# Patient Record
Sex: Male | Born: 1980 | Race: Black or African American | Hispanic: No | Marital: Single | Smoking: Current every day smoker
Health system: Southern US, Community
[De-identification: ages and names within clinical notes are randomized; demographics above are authoritative.]

## PROBLEM LIST (undated history)

## (undated) DIAGNOSIS — E785 Hyperlipidemia, unspecified: Secondary | ICD-10-CM

## (undated) DIAGNOSIS — F32A Depression, unspecified: Secondary | ICD-10-CM

## (undated) DIAGNOSIS — N529 Male erectile dysfunction, unspecified: Secondary | ICD-10-CM

## (undated) DIAGNOSIS — R011 Cardiac murmur, unspecified: Secondary | ICD-10-CM

## (undated) DIAGNOSIS — F1910 Other psychoactive substance abuse, uncomplicated: Secondary | ICD-10-CM

## (undated) DIAGNOSIS — F209 Schizophrenia, unspecified: Secondary | ICD-10-CM

## (undated) DIAGNOSIS — F1010 Alcohol abuse, uncomplicated: Secondary | ICD-10-CM

## (undated) DIAGNOSIS — Z720 Tobacco use: Secondary | ICD-10-CM

## (undated) HISTORY — PX: ORIF ORBITAL FRACTURE: SHX5312

---
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
## 2013-11-07 HISTORY — PX: WRIST SURGERY: SHX841

## 2015-02-14 LAB — CBC
HCT: 44.1 % (ref 40.0–52.0)
HGB: 14.1 g/dL (ref 13.0–18.0)
MCH: 26.6 pg (ref 26.0–34.0)
MCHC: 32 g/dL (ref 32.0–36.0)
MCV: 83 fL (ref 80–100)
PLATELETS: 278 10*3/uL (ref 150–440)
RBC: 5.32 10*6/uL (ref 4.40–5.90)
RDW: 14.5 % (ref 11.5–14.5)
WBC: 12.8 10*3/uL — ABNORMAL HIGH (ref 3.8–10.6)

## 2015-02-14 LAB — ETHANOL: Ethanol: 98 mg/dL

## 2015-02-14 LAB — DRUG SCREEN, URINE
AMPHETAMINES, UR SCREEN: NEGATIVE
Barbiturates, Ur Screen: NEGATIVE
Benzodiazepine, Ur Scrn: NEGATIVE
COCAINE METABOLITE, UR ~~LOC~~: NEGATIVE
Cannabinoid 50 Ng, Ur ~~LOC~~: NEGATIVE
MDMA (Ecstasy)Ur Screen: NEGATIVE
Methadone, Ur Screen: NEGATIVE
Opiate, Ur Screen: NEGATIVE
Phencyclidine (PCP) Ur S: NEGATIVE
Tricyclic, Ur Screen: NEGATIVE

## 2015-02-14 LAB — COMPREHENSIVE METABOLIC PANEL
ALBUMIN: 4.7 g/dL
ALT: 32 U/L
Alkaline Phosphatase: 62 U/L
Anion Gap: 9 (ref 7–16)
BUN: 12 mg/dL
Bilirubin,Total: 0.3 mg/dL
CALCIUM: 9.3 mg/dL
CHLORIDE: 107 mmol/L
CREATININE: 0.88 mg/dL
Co2: 21 mmol/L — ABNORMAL LOW
Glucose: 85 mg/dL
Potassium: 3.7 mmol/L
SGOT(AST): 25 U/L
SODIUM: 137 mmol/L
TOTAL PROTEIN: 8.3 g/dL — AB

## 2015-02-14 LAB — URINALYSIS, COMPLETE
BILIRUBIN, UR: NEGATIVE
Bacteria: NONE SEEN
Blood: NEGATIVE
Glucose,UR: NEGATIVE mg/dL (ref 0–75)
KETONE: NEGATIVE
LEUKOCYTE ESTERASE: NEGATIVE
Nitrite: NEGATIVE
Ph: 5 (ref 4.5–8.0)
Protein: NEGATIVE
RBC,UR: NONE SEEN /HPF (ref 0–5)
Specific Gravity: 1.004 (ref 1.003–1.030)
Squamous Epithelial: NONE SEEN

## 2015-02-14 LAB — ACETAMINOPHEN LEVEL: ACETAMINOPHEN: 35 ug/mL — AB

## 2015-02-14 LAB — SALICYLATE LEVEL: Salicylates, Serum: 5.8 mg/dL

## 2015-02-15 ENCOUNTER — Inpatient Hospital Stay
Admission: AD | Admit: 2015-02-15 | Discharge: 2015-02-20 | DRG: 885 | Disposition: A | Discharge status: Home / Self Care | Payer: Medicaid Other | Attending: Psychiatry | Admitting: Psychiatry

## 2015-02-15 DIAGNOSIS — F1020 Alcohol dependence, uncomplicated: Secondary | ICD-10-CM | POA: Diagnosis present

## 2015-02-15 DIAGNOSIS — F332 Major depressive disorder, recurrent severe without psychotic features: Secondary | ICD-10-CM | POA: Diagnosis present

## 2015-02-15 DIAGNOSIS — F1021 Alcohol dependence, in remission: Secondary | ICD-10-CM | POA: Diagnosis present

## 2015-02-15 DIAGNOSIS — F1590 Other stimulant use, unspecified, uncomplicated: Secondary | ICD-10-CM | POA: Diagnosis present

## 2015-02-15 DIAGNOSIS — F1010 Alcohol abuse, uncomplicated: Secondary | ICD-10-CM | POA: Diagnosis present

## 2015-02-15 DIAGNOSIS — F1421 Cocaine dependence, in remission: Secondary | ICD-10-CM | POA: Diagnosis present

## 2015-02-15 DIAGNOSIS — F17210 Nicotine dependence, cigarettes, uncomplicated: Secondary | ICD-10-CM | POA: Diagnosis present

## 2015-02-15 DIAGNOSIS — F602 Antisocial personality disorder: Secondary | ICD-10-CM | POA: Diagnosis present

## 2015-02-15 DIAGNOSIS — F17200 Nicotine dependence, unspecified, uncomplicated: Secondary | ICD-10-CM | POA: Diagnosis present

## 2015-02-15 DIAGNOSIS — Z9119 Patient's noncompliance with other medical treatment and regimen: Secondary | ICD-10-CM | POA: Diagnosis present

## 2015-02-16 LAB — TSH: Thyroid Stimulating Horm: 2.763 u[IU]/mL

## 2015-02-19 MED ORDER — MAGNESIUM HYDROXIDE 400 MG/5ML PO SUSP: 30.0000 mL | Freq: Every evening | ORAL | Status: DC | PRN

## 2015-02-19 MED ORDER — NICOTINE 10 MG IN INHA: 1.0000 | RESPIRATORY_TRACT | Status: DC | PRN

## 2015-02-19 MED ORDER — ACETAMINOPHEN 325 MG PO TABS: 650.0000 mg | ORAL_TABLET | ORAL | Status: DC | PRN

## 2015-02-19 MED ORDER — FLUOXETINE HCL 20 MG PO CAPS
20.0000 mg | ORAL_CAPSULE | Freq: Every day | ORAL | Status: DC
  Administered 2015-02-20: 20 mg via ORAL
  Filled 2015-02-19: qty 1

## 2015-02-19 MED ORDER — HYDROXYZINE HCL 50 MG PO TABS: 50.0000 mg | ORAL_TABLET | Freq: Three times a day (TID) | ORAL | Status: DC | PRN

## 2015-02-19 MED ORDER — TRAZODONE HCL 100 MG PO TABS: 100.0000 mg | ORAL_TABLET | Freq: Every day | ORAL | Status: DC

## 2015-02-19 MED ORDER — HALOPERIDOL 0.5 MG PO TABS
0.5000 mg | ORAL_TABLET | Freq: Three times a day (TID) | ORAL | Status: DC
  Administered 2015-02-20: 0.5 mg via ORAL
  Filled 2015-02-19: qty 1

## 2015-02-19 MED ORDER — BENZOCAINE-MENTHOL 20-0.5 % EX AERO
1.0000 "application " | INHALATION_SPRAY | CUTANEOUS | Status: DC | PRN
  Filled 2015-02-19: qty 56

## 2015-02-20 ENCOUNTER — Encounter: Payer: Self-pay | Admitting: Psychiatry

## 2015-02-20 DIAGNOSIS — F332 Major depressive disorder, recurrent severe without psychotic features: Secondary | ICD-10-CM | POA: Diagnosis present

## 2015-02-20 DIAGNOSIS — F1020 Alcohol dependence, uncomplicated: Secondary | ICD-10-CM | POA: Diagnosis present

## 2015-02-20 DIAGNOSIS — F1590 Other stimulant use, unspecified, uncomplicated: Secondary | ICD-10-CM | POA: Diagnosis present

## 2015-02-20 DIAGNOSIS — F17200 Nicotine dependence, unspecified, uncomplicated: Secondary | ICD-10-CM | POA: Diagnosis present

## 2015-02-20 DIAGNOSIS — F1021 Alcohol dependence, in remission: Secondary | ICD-10-CM | POA: Diagnosis present

## 2015-02-20 MED ORDER — FLUOXETINE HCL 20 MG PO CAPS: 20.0000 mg | ORAL_CAPSULE | Freq: Every day | ORAL | Status: DC

## 2015-02-20 MED ORDER — HALOPERIDOL 0.5 MG PO TABS: 0.5000 mg | ORAL_TABLET | Freq: Three times a day (TID) | ORAL | Status: DC | PRN

## 2015-02-20 MED ORDER — NICOTINE 10 MG IN INHA: 1.0000 | RESPIRATORY_TRACT | Status: DC | PRN

## 2015-02-20 MED ORDER — TRAZODONE HCL 100 MG PO TABS: 100.0000 mg | ORAL_TABLET | Freq: Every evening | ORAL | Status: DC | PRN

## 2015-02-20 NOTE — Progress Notes (Addendum)
Patient discharge instructions reviewed and AVS given. Patient appointment reminded for tomorrow. Patient belongings returned via nursing and checked with patient .
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
## 2015-02-20 NOTE — Discharge Summary (Signed)
Suicide Risk Assessment  Discharge Assessment     Current Mental Status by Physician: Patient denies suicidal or homicidal ideation, hallucinations, illusions, or delusions. Patient engages with good eye contact, is able to focus adequately in a one to one setting, and has clear goal directed thoughts. Patient speaks with a natural conversational volume, rate, and tone. Anxiety was reported at 1 on a scale of 1 the least and 10 the most. Depression was reported at 1 on the same scale. Patient is oriented times 4, recent and remote memory intact. Judgement: Good Insight: Good  Demographic factors:    Loss Factors:    Historical Factors:    Risk Reduction Factors:     Continued Clinical Symptoms:  Patient admitted for depression but currently denies depressed mood suicidality he reports much improvement since admission  Discharge Diagnoses: Major depressive disorder recurrent severe, alcohol use disorder severe,  stimulant use disorder severe (crack), tobacco use disorder, antisocial personality disorder  Cognitive Features That Contribute To Risk:  Patient is hopeful and future oriented  Suicide Risk:  Minimal: No identifiable suicidal ideation.  Patients presenting with no risk factors but with morbid ruminations; may be classified as minimal risk based on the severity of the depressive symptoms  Labs:  No results found for this or any previous visit (from the past 72 hour(s)). RISK REDUCTION FACTORS: What pt has learned from hospital stay is patient reports learning coping skills he feels more able to deal with agitation and anger. No agitation noted at discharge no irritability noted at discharge  Risk of self harm: Minimal  Risk of harm to others: Minimal    PLAN: Discharge home Continue   Medication List    TAKE these medications      Indication   FLUoxetine 20 MG capsule  Commonly known as:  PROZAC  Take 1 capsule (20 mg total) by mouth daily.   Indication:   Depression     haloperidol 0.5 MG tablet  Commonly known as:  HALDOL  Take 1 tablet (0.5 mg total) by mouth every 8 (eight) hours as needed for agitation.   Indication:  agitation     nicotine 10 MG inhaler  Commonly known as:  NICOTROL  Inhale 1 cartridge (1 continuous puffing total) into the lungs every 2 (two) hours as needed (nicotine craving).      traZODone 100 MG tablet  Commonly known as:  DESYREL  Take 1 tablet (100 mg total) by mouth at bedtime as needed for sleep.        Follow-up recommendations:  Activities: Resume typical activities Diet: Resume typical diet Tests: none Other: Follow up with outpatient provider and report any side effects to out patient prescriber.  Is patient on multiple antipsychotic therapies at discharge:  No  Has Patient had three or more failed trials of antipsychotic monotherapy by history: N/A Recommended Plan for Multiple Antipsychotic Therapies: N/A  Merlyn Albert 02/20/2015 12:44 PM
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
## 2016-04-05 ENCOUNTER — Encounter: Payer: Self-pay | Admitting: Emergency Medicine

## 2016-04-05 ENCOUNTER — Emergency Department
Admission: EM | Admit: 2016-04-05 | Discharge: 2016-04-06 | Disposition: A | Discharge status: Home / Self Care | Payer: Medicaid Other | Attending: Student | Admitting: Student

## 2016-04-05 DIAGNOSIS — Y999 Unspecified external cause status: Secondary | ICD-10-CM | POA: Insufficient documentation

## 2016-04-05 DIAGNOSIS — S0990XA Unspecified injury of head, initial encounter: Secondary | ICD-10-CM | POA: Diagnosis present

## 2016-04-05 DIAGNOSIS — Y929 Unspecified place or not applicable: Secondary | ICD-10-CM | POA: Diagnosis not present

## 2016-04-05 DIAGNOSIS — W0110XA Fall on same level from slipping, tripping and stumbling with subsequent striking against unspecified object, initial encounter: Secondary | ICD-10-CM | POA: Diagnosis not present

## 2016-04-05 DIAGNOSIS — F17210 Nicotine dependence, cigarettes, uncomplicated: Secondary | ICD-10-CM | POA: Diagnosis not present

## 2016-04-05 DIAGNOSIS — Y939 Activity, unspecified: Secondary | ICD-10-CM | POA: Insufficient documentation

## 2016-04-05 DIAGNOSIS — S51812A Laceration without foreign body of left forearm, initial encounter: Secondary | ICD-10-CM

## 2016-04-05 DIAGNOSIS — S0083XA Contusion of other part of head, initial encounter: Secondary | ICD-10-CM

## 2016-04-05 DIAGNOSIS — S01312A Laceration without foreign body of left ear, initial encounter: Secondary | ICD-10-CM | POA: Diagnosis not present

## 2016-04-05 DIAGNOSIS — F332 Major depressive disorder, recurrent severe without psychotic features: Secondary | ICD-10-CM | POA: Insufficient documentation

## 2016-04-05 DIAGNOSIS — Z79899 Other long term (current) drug therapy: Secondary | ICD-10-CM | POA: Diagnosis not present

## 2016-04-05 DIAGNOSIS — F1490 Cocaine use, unspecified, uncomplicated: Secondary | ICD-10-CM | POA: Diagnosis not present

## 2016-04-05 DIAGNOSIS — W19XXXA Unspecified fall, initial encounter: Secondary | ICD-10-CM

## 2016-04-05 LAB — RAPID HIV SCREEN (HIV 1/2 AB+AG)
HIV 1/2 ANTIBODIES: NONREACTIVE
HIV-1 P24 Antigen - HIV24: NONREACTIVE

## 2016-04-05 MED ORDER — TETANUS-DIPHTH-ACELL PERTUSSIS 5-2.5-18.5 LF-MCG/0.5 IM SUSP
0.5000 mL | Freq: Once | INTRAMUSCULAR | Status: AC
  Administered 2016-04-05: 0.5 mL via INTRAMUSCULAR
  Filled 2016-04-05: qty 0.5

## 2016-04-05 MED ORDER — LIDOCAINE-EPINEPHRINE (PF) 1 %-1:200000 IJ SOLN
10.0000 mL | Freq: Once | INTRAMUSCULAR | Status: AC
  Administered 2016-04-05: 10 mL
  Filled 2016-04-05: qty 30
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
## 2016-04-05 NOTE — Discharge Instructions (Signed)
Contusion A contusion is a deep bruise. Contusions are the result of a blunt injury to tissues and muscle fibers under the skin. The injury causes bleeding under the skin. The skin overlying the contusion may turn blue, purple, or yellow. Minor injuries will give you a painless contusion, but more severe contusions may stay painful and swollen for a few weeks.  CAUSES  This condition is usually caused by a blow, trauma, or direct force to an area of the body. SYMPTOMS  Symptoms of this condition include:  Swelling of the injured area.  Pain and tenderness in the injured area.  Discoloration. The area may have redness and then turn blue, purple, or yellow. DIAGNOSIS  This condition is diagnosed based on a physical exam and medical history. An X-ray, CT scan, or MRI may be needed to determine if there are any associated injuries, such as broken bones (fractures). TREATMENT  Specific treatment for this condition depends on what area of the body was injured. In general, the best treatment for a contusion is resting, icing, applying pressure to (compression), and elevating the injured area. This is often called the RICE strategy. Over-the-counter anti-inflammatory medicines may also be recommended for pain control.  HOME CARE INSTRUCTIONS   Rest the injured area.  If directed, apply ice to the injured area:  Put ice in a plastic bag.  Place a towel between your skin and the bag.  Leave the ice on for 20 minutes, 2-3 times per day.  If directed, apply light compression to the injured area using an elastic bandage. Make sure the bandage is not wrapped too tightly. Remove and reapply the bandage as directed by your health care provider.  If possible, raise (elevate) the injured area above the level of your heart while you are sitting or lying down.  Take over-the-counter and prescription medicines only as told by your health care provider. SEEK MEDICAL CARE IF:  Your symptoms do not  improve after several days of treatment.  Your symptoms get worse.  You have difficulty moving the injured area. SEEK IMMEDIATE MEDICAL CARE IF:   You have severe pain.  You have numbness in a hand or foot.  Your hand or foot turns pale or cold.   This information is not intended to replace advice given to you by your health care provider. Make sure you discuss any questions you have with your health care provider.   Document Released: 07/18/2005 Document Revised: 06/29/2015 Document Reviewed: 02/23/2015 Elsevier Interactive Patient Education 2016 [place].  Facial or Scalp Contusion A facial or scalp contusion is a deep bruise on the face or head. Injuries to the face and head generally cause a lot of swelling, especially around the eyes. Contusions are the result of an injury that caused bleeding under the skin. The contusion may turn blue, purple, or yellow. Minor injuries will give you a painless contusion, but more severe contusions may stay painful and swollen for a few weeks.  CAUSES  A facial or scalp contusion is caused by a blunt injury or trauma to the face or head area.  SIGNS AND SYMPTOMS   Swelling of the injured area.   Discoloration of the injured area.   Tenderness, soreness, or pain in the injured area.  DIAGNOSIS  The diagnosis can be made by taking a medical history and doing a physical exam. An X-ray exam, CT scan, or MRI may be needed to determine if there are any associated injuries, such as broken bones (fractures).  TREATMENT  Often, the best treatment for a facial or scalp contusion is applying cold compresses to the injured area. Over-the-counter medicines may also be recommended for pain control.  HOME CARE INSTRUCTIONS   Only take over-the-counter or prescription medicines as directed by your health care provider.   Apply ice to the injured area.   Put ice in a plastic bag.   Place a towel between your skin and the bag.   Leave the  ice on for 20 minutes, 2-3 times a day.  SEEK MEDICAL CARE IF:  You have bite problems.   You have pain with chewing.   You are concerned about facial defects. SEEK IMMEDIATE MEDICAL CARE IF:  You have severe pain or a headache that is not relieved by medicine.   You have unusual sleepiness, confusion, or personality changes.   You throw up (vomit).   You have a persistent nosebleed.   You have double vision or blurred vision.   You have fluid drainage from your nose or ear.   You have difficulty walking or using your arms or legs.  MAKE SURE YOU:   Understand these instructions.  Will watch your condition.  Will get help right away if you are not doing well or get worse.   This information is not intended to replace advice given to you by your health care provider. Make sure you discuss any questions you have with your health care provider.   Document Released: 11/15/2004 Document Revised: 10/29/2014 Document Reviewed: 05/21/2013 Elsevier Interactive Patient Education 2016 [place], Adult A laceration is a cut that goes through all of the layers of the skin and into the tissue that is right under the skin. Some lacerations heal on their own. Others need to be closed with stitches (sutures), staples, skin adhesive strips, or skin glue. Proper laceration care minimizes the risk of infection and helps the laceration to heal better. HOW TO CARE FOR YOUR LACERATION If sutures or staples were used:  Keep the wound clean and dry.  If you were given a bandage (dressing), you should change it at least one time per day or as told by your health care provider. You should also change it if it becomes wet or dirty.  Keep the wound completely dry for the first 24 hours or as told by your health care provider. After that time, you may shower or bathe. However, make sure that the wound is not soaked in water until after the sutures or staples have been  removed.  Clean the wound one time each day or as told by your health care provider:  Wash the wound with soap and water.  Rinse the wound with water to remove all soap.  Pat the wound dry with a clean towel. Do not rub the wound.  After cleaning the wound, apply a thin layer of antibiotic ointmentas told by your health care provider. This will help to prevent infection and keep the dressing from sticking to the wound.  Have the sutures or staples removed as told by your health care provider. If skin adhesive strips were used:  Keep the wound clean and dry.  If you were given a bandage (dressing), you should change it at least one time per day or as told by your health care provider. You should also change it if it becomes dirty or wet.  Do not get the skin adhesive strips wet. You may shower or bathe, but be careful to keep the  wound dry.  If the wound gets wet, pat it dry with a clean towel. Do not rub the wound.  Skin adhesive strips fall off on their own. You may trim the strips as the wound heals. Do not remove skin adhesive strips that are still stuck to the wound. They will fall off in time. If skin glue was used:  Try to keep the wound dry, but you may briefly wet it in the shower or bath. Do not soak the wound in water, such as by swimming.  After you have showered or bathed, gently pat the wound dry with a clean towel. Do not rub the wound.  Do not do any activities that will make you sweat heavily until the skin glue has fallen off on its own.  Do not apply liquid, cream, or ointment medicine to the wound while the skin glue is in place. Using those may loosen the film before the wound has healed.  If you were given a bandage (dressing), you should change it at least one time per day or as told by your health care provider. You should also change it if it becomes dirty or wet.  If a dressing is placed over the wound, be careful not to apply tape directly over the skin  glue. Doing that may cause the glue to be pulled off before the wound has healed.  Do not pick at the glue. The skin glue usually remains in place for 5-10 days, then it falls off of the skin. General Instructions  Take over-the-counter and prescription medicines only as told by your health care provider.  If you were prescribed an antibiotic medicine or ointment, take or apply it as told by your doctor. Do not stop using it even if your condition improves.  To help prevent scarring, make sure to cover your wound with sunscreen whenever you are outside after stitches are removed, after adhesive strips are removed, or when glue remains in place and the wound is healed. Make sure to wear a sunscreen of at least 30 SPF.  Do not scratch or pick at the wound.  Keep all follow-up visits as told by your health care provider. This is important.  Check your wound every day for signs of infection. Watch for:  Redness, swelling, or pain.  Fluid, blood, or pus.  Raise (elevate) the injured area above the level of your heart while you are sitting or lying down, if possible. SEEK MEDICAL CARE IF:  You received a tetanus shot and you have swelling, severe pain, redness, or bleeding at the injection site.  You have a fever.  A wound that was closed breaks open.  You notice a bad smell coming from your wound or your dressing.  You notice something coming out of the wound, such as wood or glass.  Your pain is not controlled with medicine.  You have increased redness, swelling, or pain at the site of your wound.  You have fluid, blood, or pus coming from your wound.  You notice a change in the color of your skin near your wound.  You need to change the dressing frequently due to fluid, blood, or pus draining from the wound.  You develop a new rash.  You develop numbness around the wound. SEEK IMMEDIATE MEDICAL CARE IF:  You develop severe swelling around the wound.  Your pain suddenly  increases and is severe.  You develop painful lumps near the wound or on skin that is anywhere on your  body.  You have a red streak going away from your wound.  The wound is on your hand or foot and you cannot properly move a finger or toe.  The wound is on your hand or foot and you notice that your fingers or toes look pale or bluish.   This information is not intended to replace advice given to you by your health care provider. Make sure you discuss any questions you have with your health care provider.   Document Released: 10/08/2005 Document Revised: 02/22/2015 Document Reviewed: 10/04/2014 Elsevier Interactive Patient Education [employer].
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
## 2016-04-06 NOTE — ED Notes (Signed)
Reviewed d/c instructions, follow-up care, laceration care with pt. Pt verbalized understanding.

## 2020-01-19 ENCOUNTER — Ambulatory Visit: Payer: Self-pay | Admitting: Pharmacy Technician

## 2020-01-19 ENCOUNTER — Other Ambulatory Visit: Payer: Self-pay

## 2020-01-19 DIAGNOSIS — Z79899 Other long term (current) drug therapy: Secondary | ICD-10-CM

## 2020-01-19 NOTE — Progress Notes (Signed)
Completed [facility] application and contract.  Patient agreed to all terms of the [facility] contract.    Patient approved to receive medication assistance at [facility] until time for re-certification in 2022, and as long as eligibility criteria continues to be met.    Provided patient with community resource material based on his particular needs.    Brian Rios Care Manager [facility]  

## 2020-06-10 ENCOUNTER — Ambulatory Visit: Payer: Self-pay

## 2020-07-17 ENCOUNTER — Emergency Department: Payer: Self-pay

## 2020-07-17 ENCOUNTER — Encounter: Payer: Self-pay | Admitting: Internal Medicine

## 2020-07-17 ENCOUNTER — Inpatient Hospital Stay
Admission: EM | Admit: 2020-07-17 | Discharge: 2020-07-19 | DRG: 065 | Disposition: A | Discharge status: Home / Self Care | Payer: Self-pay | Attending: Internal Medicine | Admitting: Internal Medicine

## 2020-07-17 ENCOUNTER — Other Ambulatory Visit: Payer: Self-pay

## 2020-07-17 ENCOUNTER — Observation Stay: Payer: Self-pay

## 2020-07-17 DIAGNOSIS — F17200 Nicotine dependence, unspecified, uncomplicated: Secondary | ICD-10-CM | POA: Diagnosis present

## 2020-07-17 DIAGNOSIS — Z79899 Other long term (current) drug therapy: Secondary | ICD-10-CM

## 2020-07-17 DIAGNOSIS — Z811 Family history of alcohol abuse and dependence: Secondary | ICD-10-CM

## 2020-07-17 DIAGNOSIS — Z8673 Personal history of transient ischemic attack (TIA), and cerebral infarction without residual deficits: Secondary | ICD-10-CM | POA: Diagnosis present

## 2020-07-17 DIAGNOSIS — F1510 Other stimulant abuse, uncomplicated: Secondary | ICD-10-CM | POA: Diagnosis present

## 2020-07-17 DIAGNOSIS — H547 Unspecified visual loss: Secondary | ICD-10-CM | POA: Diagnosis present

## 2020-07-17 DIAGNOSIS — F419 Anxiety disorder, unspecified: Secondary | ICD-10-CM | POA: Diagnosis present

## 2020-07-17 DIAGNOSIS — R4781 Slurred speech: Secondary | ICD-10-CM | POA: Diagnosis present

## 2020-07-17 DIAGNOSIS — F332 Major depressive disorder, recurrent severe without psychotic features: Secondary | ICD-10-CM | POA: Diagnosis present

## 2020-07-17 DIAGNOSIS — I639 Cerebral infarction, unspecified: Secondary | ICD-10-CM | POA: Diagnosis present

## 2020-07-17 DIAGNOSIS — R531 Weakness: Secondary | ICD-10-CM

## 2020-07-17 DIAGNOSIS — I63412 Cerebral infarction due to embolism of left middle cerebral artery: Secondary | ICD-10-CM | POA: Diagnosis present

## 2020-07-17 DIAGNOSIS — F17210 Nicotine dependence, cigarettes, uncomplicated: Secondary | ICD-10-CM | POA: Diagnosis present

## 2020-07-17 DIAGNOSIS — F1021 Alcohol dependence, in remission: Secondary | ICD-10-CM | POA: Diagnosis present

## 2020-07-17 DIAGNOSIS — Z20822 Contact with and (suspected) exposure to covid-19: Secondary | ICD-10-CM | POA: Diagnosis present

## 2020-07-17 DIAGNOSIS — I6340 Cerebral infarction due to embolism of unspecified cerebral artery: Principal | ICD-10-CM | POA: Diagnosis present

## 2020-07-17 DIAGNOSIS — F1590 Other stimulant use, unspecified, uncomplicated: Secondary | ICD-10-CM | POA: Diagnosis present

## 2020-07-17 DIAGNOSIS — F1910 Other psychoactive substance abuse, uncomplicated: Secondary | ICD-10-CM | POA: Diagnosis present

## 2020-07-17 DIAGNOSIS — F1020 Alcohol dependence, uncomplicated: Secondary | ICD-10-CM | POA: Diagnosis present

## 2020-07-17 HISTORY — DX: Depression, unspecified: F32.A

## 2020-07-17 HISTORY — DX: Alcohol abuse, uncomplicated: F10.10

## 2020-07-17 HISTORY — DX: Tobacco use: Z72.0

## 2020-07-17 HISTORY — DX: Cerebral infarction, unspecified: I63.9

## 2020-07-17 HISTORY — DX: Other psychoactive substance abuse, uncomplicated: F19.10

## 2020-07-17 LAB — RESPIRATORY PANEL BY RT PCR (FLU A&B, COVID)
Influenza A by PCR: NEGATIVE
Influenza B by PCR: NEGATIVE
SARS Coronavirus 2 by RT PCR: NEGATIVE

## 2020-07-17 LAB — VALPROIC ACID LEVEL: Valproic Acid Lvl: 25 ug/mL — ABNORMAL LOW (ref 50.0–100.0)

## 2020-07-17 LAB — URINALYSIS, COMPLETE (UACMP) WITH MICROSCOPIC
Bacteria, UA: NONE SEEN
Bilirubin Urine: NEGATIVE
Glucose, UA: NEGATIVE mg/dL
Hgb urine dipstick: NEGATIVE
Ketones, ur: NEGATIVE mg/dL
Leukocytes,Ua: NEGATIVE
Nitrite: NEGATIVE
Protein, ur: NEGATIVE mg/dL
Specific Gravity, Urine: 1.009 (ref 1.005–1.030)
Squamous Epithelial / LPF: NONE SEEN (ref 0–5)
pH: 6 (ref 5.0–8.0)

## 2020-07-17 LAB — URINE DRUG SCREEN, QUALITATIVE (ARMC ONLY)
Amphetamines, Ur Screen: NOT DETECTED
Barbiturates, Ur Screen: NOT DETECTED
Benzodiazepine, Ur Scrn: NOT DETECTED
Cannabinoid 50 Ng, Ur ~~LOC~~: NOT DETECTED
Cocaine Metabolite,Ur ~~LOC~~: NOT DETECTED
MDMA (Ecstasy)Ur Screen: NOT DETECTED
Methadone Scn, Ur: NOT DETECTED
Opiate, Ur Screen: NOT DETECTED
Phencyclidine (PCP) Ur S: NOT DETECTED
Tricyclic, Ur Screen: NOT DETECTED

## 2020-07-17 LAB — CBC
HCT: 42.5 % (ref 39.0–52.0)
Hemoglobin: 13.9 g/dL (ref 13.0–17.0)
MCH: 27.8 pg (ref 26.0–34.0)
MCHC: 32.7 g/dL (ref 30.0–36.0)
MCV: 85 fL (ref 80.0–100.0)
Platelets: 260 10*3/uL (ref 150–400)
RBC: 5 MIL/uL (ref 4.22–5.81)
RDW: 13.3 % (ref 11.5–15.5)
WBC: 9.3 10*3/uL (ref 4.0–10.5)
nRBC: 0 % (ref 0.0–0.2)

## 2020-07-17 LAB — BASIC METABOLIC PANEL
Anion gap: 10 (ref 5–15)
BUN: 13 mg/dL (ref 6–20)
CO2: 23 mmol/L (ref 22–32)
Calcium: 9.3 mg/dL (ref 8.9–10.3)
Chloride: 105 mmol/L (ref 98–111)
Creatinine, Ser: 0.92 mg/dL (ref 0.61–1.24)
GFR calc Af Amer: >60 mL/min (ref >60)
GFR calc non Af Amer: >60 mL/min (ref >60)
Glucose, Bld: 102 mg/dL — ABNORMAL HIGH (ref 70–99)
Potassium: 4.1 mmol/L (ref 3.5–5.1)
Sodium: 138 mmol/L (ref 135–145)

## 2020-07-17 LAB — APTT: aPTT: 39 seconds — ABNORMAL HIGH (ref 24–36)

## 2020-07-17 LAB — PROTIME-INR
INR: 1 (ref 0.8–1.2)
Prothrombin Time: 12.5 seconds (ref 11.4–15.2)

## 2020-07-17 LAB — TROPONIN I (HIGH SENSITIVITY): Troponin I (High Sensitivity): 3 ng/L (ref <18)

## 2020-07-17 MED ORDER — THIAMINE HCL 100 MG/ML IJ SOLN
100.0000 mg | Freq: Every day | INTRAMUSCULAR | Status: DC
  Filled 2020-07-17: qty 2

## 2020-07-17 MED ORDER — LORAZEPAM 2 MG/ML IJ SOLN: 0.0000 mg | Freq: Four times a day (QID) | INTRAMUSCULAR | Status: AC

## 2020-07-17 MED ORDER — ONDANSETRON HCL 4 MG/2ML IJ SOLN: 4.0000 mg | Freq: Three times a day (TID) | INTRAMUSCULAR | Status: DC | PRN

## 2020-07-17 MED ORDER — THIAMINE HCL 100 MG PO TABS
100.0000 mg | ORAL_TABLET | Freq: Every day | ORAL | Status: DC
  Administered 2020-07-17 – 2020-07-18 (×2): 100 mg via ORAL
  Filled 2020-07-17 (×3): qty 1

## 2020-07-17 MED ORDER — LORAZEPAM 2 MG/ML IJ SOLN: 1.0000 mg | INTRAMUSCULAR | Status: DC | PRN

## 2020-07-17 MED ORDER — SENNOSIDES-DOCUSATE SODIUM 8.6-50 MG PO TABS: 1.0000 | ORAL_TABLET | Freq: Every evening | ORAL | Status: DC | PRN

## 2020-07-17 MED ORDER — FOLIC ACID 1 MG PO TABS
1.0000 mg | ORAL_TABLET | Freq: Every day | ORAL | Status: DC
  Administered 2020-07-17 – 2020-07-18 (×2): 1 mg via ORAL
  Filled 2020-07-17 (×3): qty 1

## 2020-07-17 MED ORDER — LORAZEPAM 1 MG PO TABS: 1.0000 mg | ORAL_TABLET | ORAL | Status: DC | PRN

## 2020-07-17 MED ORDER — GADOBUTROL 1 MMOL/ML IV SOLN
10.0000 mL | Freq: Once | INTRAVENOUS | Status: AC | PRN
  Administered 2020-07-17: 10 mL via INTRAVENOUS

## 2020-07-17 MED ORDER — ADULT MULTIVITAMIN W/MINERALS CH
1.0000 | ORAL_TABLET | Freq: Every day | ORAL | Status: DC
  Administered 2020-07-17 – 2020-07-18 (×2): 1 via ORAL
  Filled 2020-07-17 (×2): qty 1

## 2020-07-17 MED ORDER — ASPIRIN 81 MG PO CHEW: 324.0000 mg | CHEWABLE_TABLET | Freq: Once | ORAL | Status: DC

## 2020-07-17 MED ORDER — STROKE: EARLY STAGES OF RECOVERY BOOK: Freq: Once | Status: AC

## 2020-07-17 MED ORDER — ACETAMINOPHEN 325 MG PO TABS
650.0000 mg | ORAL_TABLET | Freq: Four times a day (QID) | ORAL | Status: DC | PRN
  Filled 2020-07-17: qty 2

## 2020-07-17 MED ORDER — LORAZEPAM 2 MG/ML IJ SOLN: 0.0000 mg | Freq: Two times a day (BID) | INTRAMUSCULAR | Status: DC

## 2020-07-17 MED ORDER — NICOTINE 21 MG/24HR TD PT24
21.0000 mg | MEDICATED_PATCH | Freq: Every day | TRANSDERMAL | Status: DC
  Administered 2020-07-17 – 2020-07-19 (×3): 21 mg via TRANSDERMAL
  Filled 2020-07-17 (×3): qty 1

## 2020-07-17 MED ORDER — ATORVASTATIN CALCIUM 20 MG PO TABS
40.0000 mg | ORAL_TABLET | Freq: Every day | ORAL | Status: DC
  Administered 2020-07-17 – 2020-07-19 (×3): 40 mg via ORAL
  Filled 2020-07-17 (×3): qty 2

## 2020-07-17 MED ORDER — ASPIRIN EC 325 MG PO TBEC
325.0000 mg | DELAYED_RELEASE_TABLET | Freq: Every day | ORAL | Status: DC
  Administered 2020-07-17 – 2020-07-19 (×3): 325 mg via ORAL
  Filled 2020-07-17 (×3): qty 1

## 2020-07-17 MED ORDER — ENOXAPARIN SODIUM 40 MG/0.4ML ~~LOC~~ SOLN
40.0000 mg | SUBCUTANEOUS | Status: DC
  Administered 2020-07-17 – 2020-07-18 (×2): 40 mg via SUBCUTANEOUS
  Filled 2020-07-17 (×2): qty 0.4

## 2020-07-17 MED ORDER — ASPIRIN 300 MG RE SUPP
300.0000 mg | Freq: Every day | RECTAL | Status: DC
  Filled 2020-07-17: qty 1

## 2020-07-17 NOTE — ED Notes (Signed)
ED provider at bedside, stroke assessment being completed. Pt states symptoms of R sided weakness and numbness may have started up to 3d ago and have been intermittent. Mother now at bedside.

## 2020-07-17 NOTE — H&P (Addendum)
History and Physical    Weber Monnier OMV:[id number] DOB: 10-20-1981 DOA: 07/17/2020  Referring MD/NP/PA:   PCP: Luna Fuse, MD   Patient coming from:  The patient is coming from home.  At baseline, pt is independent for most of ADL.        Chief Complaint: Right-sided weakness and slurred speech  HPI: Brian Rios is a 39 y.o. male with medical history significant of polysubstance abuse, alcohol abuse, tobacco abuse, depression, who presents with right-sided weakness and slurred speech.  Patient states that he has been having right-sided weakness and numbness in the past 3 days, which is intermittent, involving both right arm and right leg.  He also has intermittent slurred speech.  No hearing loss.  Patient states that he has chronic poor vision, which has not changed.  Patient does not have chest pain, cough, shortness breath.  No fever or chills.  Denies nausea, vomiting, diarrhea, abdominal pain, symptoms of UTI.  Currently patient does not have weakness or numbness in extremities.  ED Course: pt was found to have WBC 9.3, pending COVID-19 PCR, pending BMP, temperature normal, blood pressure 124/80, heart rate 85, RR 21, oxygen saturation 96% on room air.  Patient is placed on MedSurg bed for observation.  Dr. Irish Elders of neurology is consulted.  CT head showed: Abnormal hypoattenuation and enlargement of the left caudate and surrounding basal ganglia. Additional ill-defined hypoattenuation within the overlying left corona radiata. Findings are concerning for acute/early subacute infarct. Infiltrating tumor and encephalitis are additional differential considerations. Recommend MRI with and without contrast to further evaluate   Review of Systems:   General: no fevers, chills, no body weight gain, has fatigue HEENT: has poor vision, no hearing changes or sore throat Respiratory: no dyspnea, coughing, wheezing CV: no chest pain, no palpitations GI: no  nausea, vomiting, abdominal pain, diarrhea, constipation GU: no dysuria, burning on urination, increased urinary frequency, hematuria  Ext: no leg edema Neuro: Has intermittent right-sided weakness and numbness, has slurred speech Skin: no rash, no skin tear. MSK: No muscle spasm, no deformity, no limitation of range of movement in spin Heme: No easy bruising.  Travel history: No recent long distant travel.  Allergy: No Known Allergies  Past Medical History:  Diagnosis Date  . Alcohol abuse   . Depression   . Drug abuse ([place])   . Tobacco abuse     Past Surgical History:  Procedure Laterality Date  . ORIF ORBITAL FRACTURE      Social History:  reports that he has been smoking cigarettes. He has been smoking about 1.00 pack per day. He does not have any smokeless tobacco history on file. He reports current alcohol use. He reports current drug use. Drug: "Crack" cocaine.  Family History:  Family History  Problem Relation Age of Onset  . Alcoholism Mother   . Alcoholism Father      Prior to Admission medications   Medication Sig Start Date End Date Taking? Authorizing Provider  FLUoxetine (PROZAC) 20 MG capsule Take 1 capsule (20 mg total) by mouth daily. 02/20/15   Hildred Priest, MD  haloperidol (HALDOL) 0.5 MG tablet Take 1 tablet (0.5 mg total) by mouth every 8 (eight) hours as needed for agitation. 02/20/15   Hildred Priest, MD  nicotine (NICOTROL) 10 MG inhaler Inhale 1 cartridge (1 continuous puffing total) into the lungs every 2 (two) hours as needed (nicotine craving). 02/20/15   Hildred Priest, MD  traZODone (DESYREL) 100 MG tablet Take 1 tablet (100 mg  total) by mouth at bedtime as needed for sleep. 02/20/15   Hildred Priest, MD    Physical Exam: Vitals:   07/17/20 1215 07/17/20 1217  BP: 124/80   Pulse: 85   Resp: (!) 21   Temp: 98.4 F (36.9 C)   TempSrc: Oral   SpO2: 96%   Weight:  97.1 kg  Height:  5\' 9"  (1.753 m)    General: Not in acute distress HEENT:       Eyes: PERRL, EOMI, no scleral icterus.       ENT: No discharge from the ears and nose, no pharynx injection, no tonsillar enlargement.        Neck: No JVD, no bruit, no mass felt. Heme: No neck lymph node enlargement. Cardiac: S1/S2, RRR, No murmurs, No gallops or rubs. Respiratory: No rales, wheezing, rhonchi or rubs. GI: Soft, nondistended, nontender, no rebound pain, no organomegaly, BS present. GU: No hematuria Ext: No pitting leg edema bilaterally. 2+DP/PT pulse bilaterally. Musculoskeletal: No joint deformities, No joint redness or warmth, no limitation of ROM in spin. Skin: No rashes.  Neuro: Alert, oriented X3, cranial nerves II-XII grossly intact, moves all extremities normally. Muscle strength 5/5 in all extremities, sensation to light touch intact. Brachial reflex 2+ bilaterally.  Psych: Patient is not psychotic, no suicidal or hemocidal ideation.  Labs on Admission: I have personally reviewed following labs and imaging studies  CBC: Recent Labs  Lab 07/17/20 1221  WBC 9.3  HGB 13.9  HCT 42.5  MCV 85.0  PLT 500   Basic Metabolic Panel: Recent Labs  Lab 07/17/20 1338  NA 138  K 4.1  CL 105  CO2 23  GLUCOSE 102*  BUN 13  CREATININE 0.92  CALCIUM 9.3   GFR: Estimated Creatinine Clearance: 122.7 mL/min (by C-G formula based on SCr of 0.92 mg/dL). Liver Function Tests: No results for input(s): AST, ALT, ALKPHOS, BILITOT, PROT, ALBUMIN in the last 168 hours. No results for input(s): LIPASE, AMYLASE in the last 168 hours. No results for input(s): AMMONIA in the last 168 hours. Coagulation Profile: No results for input(s): INR, PROTIME in the last 168 hours. Cardiac Enzymes: No results for input(s): CKTOTAL, CKMB, CKMBINDEX, TROPONINI in the last 168 hours. BNP (last 3 results) No results for input(s): PROBNP in the last 8760 hours. HbA1C: No results for input(s): HGBA1C in the last 72 hours. CBG: No results  for input(s): GLUCAP in the last 168 hours. Lipid Profile: No results for input(s): CHOL, HDL, LDLCALC, TRIG, CHOLHDL, LDLDIRECT in the last 72 hours. Thyroid Function Tests: No results for input(s): TSH, T4TOTAL, FREET4, T3FREE, THYROIDAB in the last 72 hours. Anemia Panel: No results for input(s): VITAMINB12, FOLATE, FERRITIN, TIBC, IRON, RETICCTPCT in the last 72 hours. Urine analysis:    Component Value Date/Time   COLORURINE Colorless 02/14/2015 1915   APPEARANCEUR Clear 02/14/2015 1915   LABSPEC 1.004 02/14/2015 1915   PHURINE 5.0 02/14/2015 1915   GLUCOSEU Negative 02/14/2015 1915   HGBUR Negative 02/14/2015 1915   BILIRUBINUR Negative 02/14/2015 1915   KETONESUR Negative 02/14/2015 1915   PROTEINUR Negative 02/14/2015 1915   NITRITE Negative 02/14/2015 1915   LEUKOCYTESUR Negative 02/14/2015 1915   Sepsis Labs: @LABRCNTIP (procalcitonin:4,lacticidven:4) )No results found for this or any previous visit (from the past 240 hour(s)).   Radiological Exams on Admission: CT HEAD WO CONTRAST  Result Date: 07/17/2020 CLINICAL DATA:  Mental status change EXAM: CT HEAD WITHOUT CONTRAST TECHNIQUE: Contiguous axial images were obtained from the base of the skull through  the vertex without intravenous contrast. COMPARISON:  None. FINDINGS: Brain: Abnormal hypoattenuation and enlargement of the left caudate and swelling basal ganglia. Additional ill-defined hypoattenuation within the overlying left corona radiata. No acute hemorrhage. Mild effacement of the frontal horn of the left lateral ventricle. Otherwise, no mass effect. No hydrocephalus. Vascular: No hyperdense vessel identified. Skull: Normal. Negative for fracture or focal lesion. Sinuses/Orbits: No acute finding. Other: No mastoid effusion. IMPRESSION: Abnormal hypoattenuation and enlargement of the left caudate and surrounding basal ganglia. Additional ill-defined hypoattenuation within the overlying left corona radiata. Findings are  concerning for acute/early subacute infarct. Infiltrating tumor and encephalitis are additional differential considerations. Recommend MRI with and without contrast to further evaluate. These findings and recommendations were results were called by telephone at the time of interpretation on 07/17/2020 at 1:07 pm to provider Dr. Cherylann Banas, Who verbally acknowledged these results. Electronically Signed   By: Margaretha Sheffield MD   On: 07/17/2020 13:13     EKG: Independently reviewed.  Sinus rhythm, QTC 4 8, J-point elevation in V2-V3, T wave inversion only in lead III, early R wave progression   Assessment/Plan Principal Problem:   Stroke [facility]) Active Problems:   Major depressive disorder, recurrent episode, severe ([facility])   Alcohol use disorder, severe, dependence ([place])   Stimulant use disorder   Tobacco use disorder   Drug abuse ([place])   Stroke [facility]): CT-head showed possible stroke left caudate head and corona radiata. Infiltrating tumor and encephalitis are additional differential differential diagnoses.  Dr. Irish Elders for neurology is consulted, who recommended to get a MRI of brain with contrast and MRA of head and neck.  - Placed on MedSurg bed for observation - Highly appreciated neurologist's consultation - Obtain MRI of brain with contrast and MRA of head and neck - start ASA and lipitor - fasting lipid panel and HbA1c  - 2D transthoracic echocardiography  - swallowing screen. If fails, will get SLP - Check UDS  - PT/OT consult   Anxiety and major depressive disorder, recurrent episode, severe ([place]): no SI or HI. -Continue home medications  Polysubstance abuse including alcohol, tobacco, stimulant use and drug abuse: -Did counseling about importance of quitting substance -Nicotine patch -CIWA protocol -f/u UDS   DVT ppx: SQ Lovenox Code Status: Full code Family Communication:  Yes, patient's mother at bed side Disposition Plan:  Anticipate discharge back to previous  environment Consults called:  Dr. Irish Elders of neurology Admission status: Med-surg bed for obs   Status is: Observation  The patient remains OBS appropriate and will d/c before 2 midnights.  Dispo: The patient is from: Home              Anticipated d/c is to: Home              Anticipated d/c date is: 1 day              Patient currently is not medically stable to d/c.          Date of Service 07/17/2020    Ivor Costa [facility]   If 7PM-7AM, please contact night-coverage [email or web address] 07/17/2020, 2:17 PM

## 2020-07-17 NOTE — ED Triage Notes (Signed)
Pt brought by [facility], intermittent R-sided weakness and numbness since 9/24 (2d). Hx drug abuse and mental illness per ems. Speech slightly slurred. Pt checked in, triage order set placed.

## 2020-07-17 NOTE — ED Notes (Signed)
Pt provided urinal. Using both arms to hold urinal at this time. When pt arrived to ER was unable to move R arm. Had to use L arm to move R arm. Able to lift both arms at this time.

## 2020-07-17 NOTE — Consult Note (Signed)
Reason for Consult: R sided weakness  Requesting Physician: Dr. Blaine Hamper  CC: R sided weakness   HPI: Brian Rios is an 39 y.o. male medical history significant of polysubstance abuse, alcohol abuse, tobacco abuse, depression, who presents with right-sided weakness and slurred speech for about 3 days, which is intermittent, involving both right arm and right leg.  He also has intermittent slurred speech.  Pt states he has been falling to the R side.    Past Medical History:  Diagnosis Date  . Alcohol abuse   . Depression   . Drug abuse ([place])   . Tobacco abuse     Past Surgical History:  Procedure Laterality Date  . ORIF ORBITAL FRACTURE      Family History  Problem Relation Age of Onset  . Alcoholism Mother   . Alcoholism Father     Social History:  reports that he has been smoking cigarettes. He has been smoking about 1.00 pack per day. He does not have any smokeless tobacco history on file. He reports current alcohol use. He reports current drug use. Drug: "Crack" cocaine.  No Known Allergies  Medications: I have reviewed the patient's current medications.  ROS: As above  Physical Examination: Blood pressure 123/85, pulse 83, temperature 98.4 F (36.9 C), temperature source Oral, resp. rate 18, height 5\' 9"  (1.753 m), weight 97.1 kg, SpO2 96 %.   Neurological Examination   Mental Status: Alert, oriented, thought content appropriate.  Mild dysarthria  Cranial Nerves: II: Discs flat bilaterally; Visual fields grossly normal, pupils equal, round, reactive to light and accommodation III,IV, VI: ptosis not present, extra-ocular motions intact bilaterally V,VII: R facial droop  VIII: hearing normal bilaterally IX,X: gag reflex present XI: bilateral shoulder shrug XII: midline tongue extension Motor: Right : Upper extremity   /5    Left:     Upper extremity   5/5  Lower extremity  4/5     Lower extremity   5/5 Tone and bulk:normal tone throughout; no atrophy  noted Sensory: Pinprick and light touch intact throughout, bilaterally Deep Tendon Reflexes: 2+ and symmetric throughout Plantars: Right: downgoing   Left: downgoing Cerebellar: normal finger-to-nose    Laboratory Studies:   Basic Metabolic Panel: Recent Labs  Lab 07/17/20 1338  NA 138  K 4.1  CL 105  CO2 23  GLUCOSE 102*  BUN 13  CREATININE 0.92  CALCIUM 9.3    Liver Function Tests: No results for input(s): AST, ALT, ALKPHOS, BILITOT, PROT, ALBUMIN in the last 168 hours. No results for input(s): LIPASE, AMYLASE in the last 168 hours. No results for input(s): AMMONIA in the last 168 hours.  CBC: Recent Labs  Lab 07/17/20 1221  WBC 9.3  HGB 13.9  HCT 42.5  MCV 85.0  PLT 260    Cardiac Enzymes: No results for input(s): CKTOTAL, CKMB, CKMBINDEX, TROPONINI in the last 168 hours.  BNP: Invalid input(s): POCBNP  CBG: No results for input(s): GLUCAP in the last 168 hours.  Microbiology: No results found for this or any previous visit.  Coagulation Studies: No results for input(s): LABPROT, INR in the last 72 hours.  Urinalysis:  Recent Labs  Lab 07/17/20 1415  COLORURINE YELLOW*  LABSPEC 1.009  PHURINE 6.0  GLUCOSEU NEGATIVE  HGBUR NEGATIVE  BILIRUBINUR NEGATIVE  KETONESUR NEGATIVE  PROTEINUR NEGATIVE  NITRITE NEGATIVE  LEUKOCYTESUR NEGATIVE    Lipid Panel:  No results found for: CHOL, TRIG, HDL, CHOLHDL, VLDL, LDLCALC  HgbA1C: No results found for: HGBA1C  Urine Drug  Screen:      Component Value Date/Time   LABOPIA NONE DETECTED 07/17/2020 1415   COCAINSCRNUR NONE DETECTED 07/17/2020 1415   LABBENZ NONE DETECTED 07/17/2020 1415   AMPHETMU NONE DETECTED 07/17/2020 1415   THCU NONE DETECTED 07/17/2020 1415   LABBARB NONE DETECTED 07/17/2020 1415    Alcohol Level: No results for input(s): ETH in the last 168 hours.  Other results: EKG: normal EKG, normal sinus rhythm, unchanged from previous tracings.  Imaging: CT HEAD WO  CONTRAST  Result Date: 07/17/2020 CLINICAL DATA:  Mental status change EXAM: CT HEAD WITHOUT CONTRAST TECHNIQUE: Contiguous axial images were obtained from the base of the skull through the vertex without intravenous contrast. COMPARISON:  None. FINDINGS: Brain: Abnormal hypoattenuation and enlargement of the left caudate and swelling basal ganglia. Additional ill-defined hypoattenuation within the overlying left corona radiata. No acute hemorrhage. Mild effacement of the frontal horn of the left lateral ventricle. Otherwise, no mass effect. No hydrocephalus. Vascular: No hyperdense vessel identified. Skull: Normal. Negative for fracture or focal lesion. Sinuses/Orbits: No acute finding. Other: No mastoid effusion. IMPRESSION: Abnormal hypoattenuation and enlargement of the left caudate and surrounding basal ganglia. Additional ill-defined hypoattenuation within the overlying left corona radiata. Findings are concerning for acute/early subacute infarct. Infiltrating tumor and encephalitis are additional differential considerations. Recommend MRI with and without contrast to further evaluate. These findings and recommendations were results were called by telephone at the time of interpretation on 07/17/2020 at 1:07 pm to provider Dr. Cherylann Banas, Who verbally acknowledged these results. Electronically Signed   By: Margaretha Sheffield MD   On: 07/17/2020 13:13     Assessment/Plan:  39 y.o. male medical history significant of polysubstance abuse, alcohol abuse, tobacco abuse, depression, who presents with right-sided weakness and slurred speech for about 3 days, which is intermittent, involving both right arm and right leg.  He also has intermittent slurred speech.  Pt states he has been falling to the R side.    - CTH with L BG stroke - MRI and MRA h/n pending - ASA/stat therapy -2decho - pt/ot - speech  - pt is being admitted - d/w mother and pt at bedside.    Leotis Pain  07/17/2020, 3:02 PM

## 2020-07-17 NOTE — ED Notes (Signed)
Pt given water, cola, crackers.

## 2020-07-17 NOTE — ED Notes (Signed)
Pt to MRI

## 2020-07-17 NOTE — ED Provider Notes (Signed)
[facility] Emergency Department Provider Note   ____________________________________________   First MD Initiated Contact with Patient 07/17/20 1332     (approximate)  I have reviewed the triage vital signs and the nursing notes.   HISTORY  Chief Complaint Weakness    HPI Brian Rios is a 39 y.o. male history of alcohol abuse depression drug abuse  Patient presents today he has been having 2 to 3 days of weakness and numbness of his right side, somewhat intermittent in nature.  Has been present though for at least 2 days.  Symptoms are somewhat intermittent at times  No difficulty speaking.  Some numbness over the right side.  Does not take any blood thinners.  History of stroke in older family member grandparent  He does smoke  History of schizophrenia.  Past Medical History:  Diagnosis Date  . Alcohol abuse   . Depression   . Drug abuse ([place])   . Tobacco abuse     Patient Active Problem List   Diagnosis Date Noted  . Stroke ([place]) 07/17/2020  . Drug abuse ([place]) 07/17/2020  . Major depressive disorder, recurrent episode, severe ([place]) 02/20/2015  . Alcohol use disorder, severe, dependence ([place]) 02/20/2015  . Stimulant use disorder 02/20/2015  . Tobacco use disorder 02/20/2015    Past Surgical History:  Procedure Laterality Date  . ORIF ORBITAL FRACTURE      Prior to Admission medications   Medication Sig Start Date End Date Taking? Authorizing Provider  FLUoxetine (PROZAC) 20 MG capsule Take 1 capsule (20 mg total) by mouth daily. 02/20/15   Hildred Priest, MD  haloperidol (HALDOL) 0.5 MG tablet Take 1 tablet (0.5 mg total) by mouth every 8 (eight) hours as needed for agitation. 02/20/15   Hildred Priest, MD  nicotine (NICOTROL) 10 MG inhaler Inhale 1 cartridge (1 continuous puffing total) into the lungs every 2 (two) hours as needed (nicotine craving). 02/20/15   Hildred Priest, MD  traZODone  (DESYREL) 100 MG tablet Take 1 tablet (100 mg total) by mouth at bedtime as needed for sleep. 02/20/15   Hildred Priest, MD    Allergies Patient has no known allergies.  Family History  Problem Relation Age of Onset  . Alcoholism Mother   . Alcoholism Father     Social History Social History   Tobacco Use  . Smoking status: Current Every Day Smoker    Packs/day: 1.00    Types: Cigarettes  Substance Use Topics  . Alcohol use: Yes  . Drug use: Yes    Types: "Crack" cocaine    Review of Systems Constitutional: No fever/chills Eyes: No visual changes. ENT: No sore throat. Cardiovascular: Denies chest pain. Respiratory: Denies shortness of breath. Gastrointestinal: No abdominal pain.   Genitourinary: Negative for dysuria. Musculoskeletal: Negative for back pain. Skin: Negative for rash. Neurological: Negative for headaches. Denies Covid exposure    ____________________________________________   PHYSICAL EXAM:  VITAL SIGNS: ED Triage Vitals  Enc Vitals Group     BP 07/17/20 1215 124/80     Pulse Rate 07/17/20 1215 85     Resp 07/17/20 1215 (!) 21     Temp 07/17/20 1215 98.4 F (36.9 C)     Temp Source 07/17/20 1215 Oral     SpO2 07/17/20 1215 96 %     Weight 07/17/20 1217 214 lb (97.1 kg)     Height 07/17/20 1217 5\' 9"  (1.753 m)     Head Circumference --      Peak Flow --  Pain Score 07/17/20 1216 0     Pain Loc --      Pain Edu? --      Excl. in [place]? --     Constitutional: Alert and oriented. Well appearing and in no acute distress. Eyes: Conjunctivae are normal. Head: Atraumatic. Nose: No congestion/rhinnorhea. Mouth/Throat: Mucous membranes are moist. Neck: No stridor.  Cardiovascular: Normal rate, regular rhythm. Grossly normal heart sounds.  Good peripheral circulation. Respiratory: Normal respiratory effort.  No retractions. Lungs CTAB. Gastrointestinal: Soft and nontender. No distention. Musculoskeletal: No lower extremity  tenderness nor edema. Neurologic:  Normal speech and language. No gross focal neurologic deficits are appreciated.  He does have moderate weakness in his right arm and right leg about 2-3+ strength in each.  Pronator drift present on the right.  Decreased sensation right arm and right leg.  Somewhat ataxic.  Skin:  Skin is warm, dry and intact. No rash noted. Psychiatric: Mood and affect are normal. Speech and behavior are normal.  ____________________________________________   LABS (all labs ordered are listed, but only abnormal results are displayed)  Labs Reviewed  BASIC METABOLIC PANEL - Abnormal; Notable for the following components:      Result Value   Glucose, Bld 102 (*)    All other components within normal limits  RESPIRATORY PANEL BY RT PCR (FLU A&B, COVID)  CBC  URINALYSIS, COMPLETE (UACMP) WITH MICROSCOPIC  VALPROIC ACID LEVEL  URINE DRUG SCREEN, QUALITATIVE ([facility] ONLY)  CBG MONITORING, ED  TROPONIN I (HIGH SENSITIVITY)   ____________________________________________  EKG  Reviewed interpreted at 1215 Heart rate 85 QRs 100 QTc 410 Normal sinus rhythm, ST abnormality including T wave inversions lead III, J-point elevation in V2 and slight in V3 felt likely due to repolarization abnormality.  No STEMI.  Of note the patient denies any chest pain ____________________________________________  RADIOLOGY  CT HEAD WO CONTRAST  Result Date: 07/17/2020 CLINICAL DATA:  Mental status change EXAM: CT HEAD WITHOUT CONTRAST TECHNIQUE: Contiguous axial images were obtained from the base of the skull through the vertex without intravenous contrast. COMPARISON:  None. FINDINGS: Brain: Abnormal hypoattenuation and enlargement of the left caudate and swelling basal ganglia. Additional ill-defined hypoattenuation within the overlying left corona radiata. No acute hemorrhage. Mild effacement of the frontal horn of the left lateral ventricle. Otherwise, no mass effect. No hydrocephalus.  Vascular: No hyperdense vessel identified. Skull: Normal. Negative for fracture or focal lesion. Sinuses/Orbits: No acute finding. Other: No mastoid effusion. IMPRESSION: Abnormal hypoattenuation and enlargement of the left caudate and surrounding basal ganglia. Additional ill-defined hypoattenuation within the overlying left corona radiata. Findings are concerning for acute/early subacute infarct. Infiltrating tumor and encephalitis are additional differential considerations. Recommend MRI with and without contrast to further evaluate. These findings and recommendations were results were called by telephone at the time of interpretation on 07/17/2020 at 1:07 pm to provider Dr. Cherylann Banas, Who verbally acknowledged these results. Electronically Signed   By: Margaretha Sheffield MD   On: 07/17/2020 13:13    CT of the head reviewed and discussed with Dr. Irish Elders.  Concern for abnormality in the left caudate region.  Acute or early subacute infarct versus other cause such as tumor or encephalitis.  Discussed with Dr. Irish Elders, he advises the patient's clinical history symptoms and imaging appear consistent with caudate stroke ____________________________________________   PROCEDURES  Procedure(s) performed: None  Procedures  Critical Care performed: No  ____________________________________________   INITIAL IMPRESSION / ASSESSMENT AND PLAN / ED COURSE  Pertinent labs & imaging results  that were available during my care of the patient were reviewed by me and considered in my medical decision making (see chart for details).   Patient presents for right-sided weakness and numbness.  Somewhat intermittent but is apparently had also deficit including facial droop for a few days, up to 2 days time.  He is outside of any acute TPA or acute neuro interventional window for stroke such as for treatment of LVO. Negative VAN stroke scale for LVO.    Clinical Course as of Jul 17 1426  Sun Jul 17, 2020  1232  Dr. Irish Elders discussed case with me, he will be coming to see patient for stat consult.    [MQ]    Clinical Course User Index [MQ] Delman Kitten, MD   No associated chest pain.  Lab work reassuring including normal CBC and chemistry.  Discussed with Dr. Irish Elders is seen evaluate the patient, recommends treatment with aspirin and admission for further work-up including MRI.  Admission discussed with Dr. Verita Schneiders was evaluated in Emergency Department on 07/17/2020 for the symptoms described in the history of present illness. He was evaluated in the context of the global COVID-19 pandemic, which necessitated consideration that the patient might be at risk for infection with the SARS-CoV-2 virus that causes COVID-19. Institutional protocols and algorithms that pertain to the evaluation of patients at risk for COVID-19 are in a state of rapid change based on information released by regulatory bodies including the CDC and federal and state organizations. These policies and algorithms were followed during the patient's care in the ED.   ____________________________________________   FINAL CLINICAL IMPRESSION(S) / ED DIAGNOSES  Final diagnoses:  Cerebrovascular accident (CVA), unspecified mechanism ([place])  Acute right-sided weakness        Note:  This document was prepared using Dragon voice recognition software and may include unintentional dictation errors       Delman Kitten, MD 07/17/20 1427

## 2020-07-17 NOTE — ED Notes (Signed)
Pt back from MRI 

## 2020-07-17 NOTE — ED Notes (Signed)
Mother left room to update family. Correct number added to pt chart.

## 2020-07-18 ENCOUNTER — Encounter: Payer: Self-pay | Admitting: Internal Medicine

## 2020-07-18 ENCOUNTER — Observation Stay
Admit: 2020-07-18 | Discharge: 2020-07-18 | Disposition: A | Discharge status: Home / Self Care | Payer: Self-pay | Attending: Internal Medicine | Admitting: Internal Medicine

## 2020-07-18 DIAGNOSIS — I639 Cerebral infarction, unspecified: Secondary | ICD-10-CM | POA: Diagnosis present

## 2020-07-18 DIAGNOSIS — Z8673 Personal history of transient ischemic attack (TIA), and cerebral infarction without residual deficits: Secondary | ICD-10-CM | POA: Diagnosis present

## 2020-07-18 LAB — ECHOCARDIOGRAM COMPLETE
AR max vel: 2.13 cm2
AV Area VTI: 2.24 cm2
AV Area mean vel: 2.19 cm2
AV Mean grad: 3.5 mmHg
AV Peak grad: 6.2 mmHg
Ao pk vel: 1.24 m/s
Area-P 1/2: 4.04 cm2
Height: 69 in
S' Lateral: 2.9 cm
Weight: 3424 oz

## 2020-07-18 LAB — HEMOGLOBIN A1C
Hgb A1c MFr Bld: 6 % — ABNORMAL HIGH (ref 4.8–5.6)
Mean Plasma Glucose: 125.5 mg/dL

## 2020-07-18 LAB — LIPID PANEL
Cholesterol: 278 mg/dL — ABNORMAL HIGH (ref 0–200)
HDL: 51 mg/dL (ref >40)
LDL Cholesterol: 200 mg/dL — ABNORMAL HIGH (ref 0–99)
Total CHOL/HDL Ratio: 5.5 RATIO
Triglycerides: 133 mg/dL (ref <150)
VLDL: 27 mg/dL (ref 0–40)

## 2020-07-18 LAB — HIV ANTIBODY (ROUTINE TESTING W REFLEX): HIV Screen 4th Generation wRfx: NONREACTIVE

## 2020-07-18 MED ORDER — DIVALPROEX SODIUM ER 500 MG PO TB24
750.0000 mg | ORAL_TABLET | Freq: Every day | ORAL | Status: DC
  Administered 2020-07-18 (×2): 750 mg via ORAL
  Filled 2020-07-18: qty 3
  Filled 2020-07-18: qty 1
  Filled 2020-07-18: qty 3
  Filled 2020-07-18: qty 1

## 2020-07-18 MED ORDER — ARIPIPRAZOLE 15 MG PO TABS
30.0000 mg | ORAL_TABLET | Freq: Every day | ORAL | Status: DC
  Administered 2020-07-18: 30 mg via ORAL
  Filled 2020-07-18 (×3): qty 2

## 2020-07-18 MED ORDER — CLOPIDOGREL BISULFATE 75 MG PO TABS
75.0000 mg | ORAL_TABLET | Freq: Every day | ORAL | Status: DC
  Administered 2020-07-19: 75 mg via ORAL
  Filled 2020-07-18: qty 1

## 2020-07-18 MED ORDER — CLOPIDOGREL BISULFATE 75 MG PO TABS
300.0000 mg | ORAL_TABLET | Freq: Once | ORAL | Status: AC
  Administered 2020-07-18: 300 mg via ORAL
  Filled 2020-07-18: qty 4

## 2020-07-18 MED ORDER — VALBENAZINE TOSYLATE 80 MG PO CAPS: 1.0000 | ORAL_CAPSULE | Freq: Every day | ORAL | Status: DC

## 2020-07-18 MED ORDER — BENZTROPINE MESYLATE 1 MG PO TABS
1.0000 mg | ORAL_TABLET | Freq: Every day | ORAL | Status: DC
  Administered 2020-07-18 (×2): 1 mg via ORAL
  Filled 2020-07-18 (×4): qty 1

## 2020-07-18 MED ORDER — MIRTAZAPINE 15 MG PO TABS
45.0000 mg | ORAL_TABLET | Freq: Every day | ORAL | Status: DC
  Administered 2020-07-18: 45 mg via ORAL
  Filled 2020-07-18: qty 3

## 2020-07-18 NOTE — Progress Notes (Signed)
*  PRELIMINARY RESULTS* Echocardiogram 2D Echocardiogram has been performed.  Brian Rios 07/18/2020, 7:50 AM

## 2020-07-18 NOTE — ED Notes (Signed)
Pt denies any numbness/tingling or weakness at this time. Denies pain

## 2020-07-18 NOTE — Plan of Care (Signed)
  Problem: Education: Goal: Knowledge of secondary prevention will improve 07/18/2020 1509 by Ferrel Logan, RN Outcome: Progressing 07/18/2020 1508 by Ferrel Logan, RN Outcome: Progressing

## 2020-07-18 NOTE — Progress Notes (Signed)
OT Screen Note  Patient Details Name: Brian Rios MRN: [id number] DOB: [birth year]   Cancelled Treatment:    Reason Eval/Treat Not Completed: OT screened, no needs identified, will sign off.  Pt currently functioning at independent level for all aspects of self care and functional mobility without use of AD. Pt report's symptoms have resolved and he feels he is at his baseline. No further skilled need. Thank you for this referral. OT will SIGN OFF.  Darleen Crocker, [place], OTR/L , CBIS ascom [phone]  07/18/20, 2:34 PM   07/18/2020, 2:32 PM

## 2020-07-18 NOTE — Progress Notes (Signed)
SLP Cancellation Note  Patient Details Name: Brian Rios MRN: [id number] DOB: December 26, 1980   Cancelled treatment:       Reason Eval/Treat Not Completed: SLP screened, no needs identified, will sign off (chart reviewed; consulted NSG then met w/ pt). Pt denied any difficulty swallowing and is currently on a regular diet; tolerates swallowing pills w/ water per NSG. Pt conversed at conversational level w/out deficits noted; pt and NSG denied any speech-language deficits. Pt stated he was "straight now".   No further skilled ST services indicated as pt appears at his baseline. Pt agreed. NSG to reconsult if any change in status.     Orinda Kenner, MS, CCC-SLP Speech Language Pathologist [facility] [phone] [facility] 07/18/2020, 3:04 PM

## 2020-07-18 NOTE — Progress Notes (Signed)
PT Cancellation Note  Patient Details Name: Brian Rios MRN: [id number] DOB: 27-Dec-1980   Cancelled Treatment:    Reason Eval/Treat Not Completed: Other (comment) Pt notes being at his baseline in regards to strength, coordination, and ambulation. He notes his strength is back on his R with no lingering deficits. He has full ROM with bilateral UE and LE movements. In regards to coordination, pt showed no deficits between L and R during finger to nose test. During ambulation, pt walked 150 feet at his normal pace without any AD. He was completely steady and able to maintain good balance. He does not need any PT services at this time. Please re-order if circumstances change.   Noemi Chapel, SPT  Bernita Raisin 07/18/2020, 11:54 AM

## 2020-07-18 NOTE — Progress Notes (Signed)
Subjective: He feels significantly improved  Exam: Vitals:   07/18/20 0400 07/18/20 0500  BP: 138/71 122/90  Pulse: 84 88  Resp:  14  Temp:    SpO2:  96%   Gen: In bed, NAD Resp: non-labored breathing, no acute distress Abd: soft, nt  Neuro: MS: Awake, alert, interactive and appropriate CN: Pupils are reactive bilaterally, he has a left exotropia which is baseline for him, mild right facial weakness Motor: He does not have drift, or weakness to confrontation, but does have mildly impaired fine motor movements on the right Sensory: He endorses symmetric sensation to both light touch and temperature.  Pertinent Labs: LDL 200(no statin prior to arrival) A1c - pending UDS - negative  MRI/MRA reviewed - large subcortical infarct involving head of the caudate and   Initial NIHSS: 4  Impression: 39 yo M with multifocal infarcts on the left. Certainly with his LDL and smoking history, he is at risk of small vessel disease and the subcortical infarct could be associated with this, but the cortical infarcts appear more likley embolic. He will need optimization of his risk factors including a high intensity statin, as well as screening for embolic sources including echo and if his TTE is negative, consideration of TEE.   Recommendations: 1) Recommend high intensity statin such as atorvastatin 80mg  qhs 2) Dual antiplatelet therapy with aSA and plavix for three weeks followed by monotherapy.  3) If A1C is elevated, would target A1C < 7 4) Continue psychiatric medications at home doses.  5) Echocardiogram, consider TEE if no embolic source found 6) cardiac monitoring  7) I have counseled smoking cessation to the patient 8) neurology will continue to follow.   Roland Rack, MD [facility] [phone]  If 7pm- 7am, please page neurology on call as listed in [place].

## 2020-07-18 NOTE — Progress Notes (Signed)
PROGRESS NOTE    Brian Rios  XNT:[id number] DOB: [birth year] DOA: 07/17/2020 PCP: Luna Fuse, MD   Chief complaint: stroke.  Brief Narrative:  Brian Rios is a 39 y.o. male with medical history significant of polysubstance abuse, alcohol abuse, tobacco abuse, depression, who presents with right-sided weakness and slurred speech.  MRI showed multifocal acute infarcts involving the left cerebrum and left basal ganglia, likely embolic.  9/27.  Patient symptom improved.  Pending echocardiogram, TEE if transthoracic echocardiogram does not see any blood clots.   Assessment & Plan:   Principal Problem:   Stroke [facility]) Active Problems:   Major depressive disorder, recurrent episode, severe (HCC)   Alcohol use disorder, severe, dependence ([place])   Stimulant use disorder   Tobacco use disorder   Drug abuse ([place])  #1.  Left brain embolic stroke.   Discussed with neurology, may need TEE if her echocardiogram is normal. Continue aspirin, Plavix, Lipitor.  #2.  Alcohol abuse. No evidence of withdrawal. Continue thiamine and folic acid.  Monitor for withdrawal symptoms.  CIWA protocol.  3.  Polydrug abuse including cocaine. Patient does not use IV drugs.     DVT prophylaxis: Lovenox Code Status: Full Family Communication: None  .   Status is: Observation  The patient will require care spanning > 2 midnights and should be moved to inpatient because: Additional work-up is needed for stroke.  Dispo: The patient is from: Home              Anticipated d/c is to: Home              Anticipated d/c date is: 1 day              Patient currently is not medically stable to d/c.        I/O last 3 completed shifts: In: -  Out: 1100 [Urine:1100] No intake/output data recorded.     Consultants:   Neurology  Procedures: None  Antimicrobials:None  Subjective: Patient feels better.  Speech has recovered.  Right-sided weakness has improved. Does  not have any dysphagia. No nausea vomiting abdominal pain peer No breath or cough. No fever chills.  Objective: Vitals:   07/18/20 0045 07/18/20 0400 07/18/20 0500 07/18/20 0930  BP:  138/71 122/90 129/68  Pulse: 99 84 88 85  Resp:   14 16  Temp:      TempSrc:      SpO2: 98%  96% 97%  Weight:      Height:        Intake/Output Summary (Last 24 hours) at 07/18/2020 0953 Last data filed at 07/17/2020 2230 Gross per 24 hour  Intake --  Output 1100 ml  Net -1100 ml   Filed Weights   07/17/20 1217  Weight: 97.1 kg    Examination:  General exam: Appears calm and comfortable  Respiratory system: Clear to auscultation. Respiratory effort normal. Cardiovascular system: S1 & S2 heard, RRR. No JVD, murmurs, rubs, gallops or clicks. No pedal edema. Gastrointestinal system: Abdomen is nondistended, soft and nontender. No organomegaly or masses felt. Normal bowel sounds heard. Central nervous system: Alert and oriented. No focal neurological deficits. Extremities: Symmetric  Skin: No rashes, lesions or ulcers Psychiatry:  Mood & affect appropriate.     Data Reviewed: I have personally reviewed following labs and imaging studies  CBC: Recent Labs  Lab 07/17/20 1221  WBC 9.3  HGB 13.9  HCT 42.5  MCV 85.0  PLT 967   Basic Metabolic Panel: Recent Labs  Lab 07/17/20 1338  NA 138  K 4.1  CL 105  CO2 23  GLUCOSE 102*  BUN 13  CREATININE 0.92  CALCIUM 9.3   GFR: Estimated Creatinine Clearance: 122.7 mL/min (by C-G formula based on SCr of 0.92 mg/dL). Liver Function Tests: No results for input(s): AST, ALT, ALKPHOS, BILITOT, PROT, ALBUMIN in the last 168 hours. No results for input(s): LIPASE, AMYLASE in the last 168 hours. No results for input(s): AMMONIA in the last 168 hours. Coagulation Profile: Recent Labs  Lab 07/17/20 1641  INR 1.0   Cardiac Enzymes: No results for input(s): CKTOTAL, CKMB, CKMBINDEX, TROPONINI in the last 168 hours. BNP (last 3  results) No results for input(s): PROBNP in the last 8760 hours. HbA1C: No results for input(s): HGBA1C in the last 72 hours. CBG: No results for input(s): GLUCAP in the last 168 hours. Lipid Profile: Recent Labs    07/18/20 0502  CHOL 278*  HDL 51  LDLCALC 200*  TRIG 133  CHOLHDL 5.5   Thyroid Function Tests: No results for input(s): TSH, T4TOTAL, FREET4, T3FREE, THYROIDAB in the last 72 hours. Anemia Panel: No results for input(s): VITAMINB12, FOLATE, FERRITIN, TIBC, IRON, RETICCTPCT in the last 72 hours. Sepsis Labs: No results for input(s): PROCALCITON, LATICACIDVEN in the last 168 hours.  Recent Results (from the past 240 hour(s))  Respiratory Panel by RT PCR (Flu A&B, Covid) - Nasopharyngeal Swab     Status: None   Collection Time: 07/17/20  2:33 PM   Specimen: Nasopharyngeal Swab  Result Value Ref Range Status   SARS Coronavirus 2 by RT PCR NEGATIVE NEGATIVE Final    Comment: (NOTE) SARS-CoV-2 target nucleic acids are NOT DETECTED.  The SARS-CoV-2 RNA is generally detectable in upper respiratoy specimens during the acute phase of infection. The lowest concentration of SARS-CoV-2 viral copies this assay can detect is 131 copies/mL. A negative result does not preclude SARS-Cov-2 infection and should not be used as the sole basis for treatment or other patient management decisions. A negative result may occur with  improper specimen collection/handling, submission of specimen other than nasopharyngeal swab, presence of viral mutation(s) within the areas targeted by this assay, and inadequate number of viral copies (<131 copies/mL). A negative result must be combined with clinical observations, patient history, and epidemiological information. The expected result is Negative.  Fact Sheet for Patients:  PinkCheek.be  Fact Sheet for Healthcare Providers:  GravelBags.it  This test is no t yet approved or  cleared by the [place] FDA and  has been authorized for detection and/or diagnosis of SARS-CoV-2 by FDA under an Emergency Use Authorization (EUA). This EUA will remain  in effect (meaning this test can be used) for the duration of the COVID-19 declaration under Section 564(b)(1) of the Act, 21 U.S.C. section 360bbb-3(b)(1), unless the authorization is terminated or revoked sooner.     Influenza A by PCR NEGATIVE NEGATIVE Final   Influenza B by PCR NEGATIVE NEGATIVE Final    Comment: (NOTE) The Xpert Xpress SARS-CoV-2/FLU/RSV assay is intended as an aid in  the diagnosis of influenza from Nasopharyngeal swab specimens and  should not be used as a sole basis for treatment. Nasal washings and  aspirates are unacceptable for Xpert Xpress SARS-CoV-2/FLU/RSV  testing.  Fact Sheet for Patients: PinkCheek.be  Fact Sheet for Healthcare Providers: GravelBags.it  This test is not yet approved or cleared by the [place] FDA and  has been authorized for detection and/or diagnosis of SARS-CoV-2 by  FDA under an  Emergency Use Authorization (EUA). This EUA will remain  in effect (meaning this test can be used) for the duration of the  Covid-19 declaration under Section 564(b)(1) of the Act, 21  U.S.C. section 360bbb-3(b)(1), unless the authorization is  terminated or revoked. Performed at [facility], [address] [place], [place] [address]          Radiology Studies: CT HEAD WO CONTRAST  Result Date: 07/17/2020 CLINICAL DATA:  Mental status change EXAM: CT HEAD WITHOUT CONTRAST TECHNIQUE: Contiguous axial images were obtained from the base of the skull through the vertex without intravenous contrast. COMPARISON:  None. FINDINGS: Brain: Abnormal hypoattenuation and enlargement of the left caudate and swelling basal ganglia. Additional ill-defined hypoattenuation within the overlying left corona radiata. No  acute hemorrhage. Mild effacement of the frontal horn of the left lateral ventricle. Otherwise, no mass effect. No hydrocephalus. Vascular: No hyperdense vessel identified. Skull: Normal. Negative for fracture or focal lesion. Sinuses/Orbits: No acute finding. Other: No mastoid effusion. IMPRESSION: Abnormal hypoattenuation and enlargement of the left caudate and surrounding basal ganglia. Additional ill-defined hypoattenuation within the overlying left corona radiata. Findings are concerning for acute/early subacute infarct. Infiltrating tumor and encephalitis are additional differential considerations. Recommend MRI with and without contrast to further evaluate. These findings and recommendations were results were called by telephone at the time of interpretation on 07/17/2020 at 1:07 pm to provider Dr. Cherylann Banas, Who verbally acknowledged these results. Electronically Signed   By: Margaretha Sheffield MD   On: 07/17/2020 13:13   MR ANGIO HEAD WO CONTRAST  Addendum Date: 07/17/2020   ADDENDUM REPORT: 07/17/2020 16:38 ADDENDUM: These results were called by telephone at the time of interpretation on 07/17/2020 at 4:38 pm to provider Ivor Costa , who verbally acknowledged these results. Electronically Signed   By: Primitivo Gauze M.D.   On: 07/17/2020 16:38   Result Date: 07/17/2020 CLINICAL DATA:  Stroke, follow-up EXAM: MR HEAD WITHOUT CONTRAST AND WITH MR CIRCLE OF WILLIS WITHOUT CONTRAST MRA OF THE NECK WITHOUT AND WITH CONTRAST TECHNIQUE: Multiplanar, multiecho pulse sequences of the brain, circle of willis and surrounding structures were obtained without intravenous contrast. Postcontrast imaging of the brain was obtained. Angiographic images of the neck were obtained using MRA technique without and with intravenous contrast. CONTRAST:  12mL GADAVIST GADOBUTROL 1 MMOL/ML IV SOLN COMPARISON:  07/17/2020 head CT. FINDINGS: MR HEAD FINDINGS Brain: Multifocal acute infarcts involving the left frontoparietal and  occipitotemporal regions. Larger infarcts are seen within the left caudate and putamen. Minimal petechial hemorrhage along the left caudate. No midline shift, ventriculomegaly or extra-axial fluid collection. No mass lesion. No abnormal enhancement. Vascular: Please see MRA head. Skull and upper cervical spine: Normal marrow signal. Sinuses/Orbits: Normal orbits. Ethmoid and right maxillary sinus mucosal thickening. No mastoid effusion. Other: None. MR CIRCLE OF WILLIS FINDINGS Anterior circulation: Left A1 segment aplasia. Patent, normal caliber anterior cerebral arteries. Patent normal caliber appearance of the internal carotid and middle cerebral arteries. No significant stenosis, proximal occlusion, aneurysm, or vascular malformation. Posterior circulation: Right dominant vertebral artery. Patent normal caliber V4 segments, PICA, basilar, superior cerebellar and posterior cerebral arteries. No significant stenosis, proximal occlusion, aneurysm, or vascular malformation. Venous sinuses: Patent. Anatomic variants: Bilateral posterior communicating arteries are either absent or hypoplastic. MRA NECK FINDINGS Motion artifact limits evaluation. There is no high-grade narrowing or focal aneurysm involving the bilateral carotid arteries. No evidence of dissection. The bilateral vertebral arteries are patent and demonstrate antegrade flow. Dominant right vertebral artery. Mild left  V1 segment narrowing. No evidence of high-grade narrowing or focal aneurysm. Three vessel aortic arch. 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 No high-grade narrowing, large vessel occlusion, dissection or aneurysm within the head or neck. Electronically Signed: By: Primitivo Gauze M.D. On: 07/17/2020 16:23   MR ANGIO NECK W WO CONTRAST  Addendum Date: 07/17/2020   ADDENDUM REPORT: 07/17/2020 16:38 ADDENDUM: These results were called by telephone at the time of  interpretation on 07/17/2020 at 4:38 pm to provider Ivor Costa , who verbally acknowledged these results. Electronically Signed   By: Primitivo Gauze M.D.   On: 07/17/2020 16:38   Result Date: 07/17/2020 CLINICAL DATA:  Stroke, follow-up EXAM: MR HEAD WITHOUT CONTRAST AND WITH MR CIRCLE OF WILLIS WITHOUT CONTRAST MRA OF THE NECK WITHOUT AND WITH CONTRAST TECHNIQUE: Multiplanar, multiecho pulse sequences of the brain, circle of willis and surrounding structures were obtained without intravenous contrast. Postcontrast imaging of the brain was obtained. Angiographic images of the neck were obtained using MRA technique without and with intravenous contrast. CONTRAST:  64mL GADAVIST GADOBUTROL 1 MMOL/ML IV SOLN COMPARISON:  07/17/2020 head CT. FINDINGS: MR HEAD FINDINGS Brain: Multifocal acute infarcts involving the left frontoparietal and occipitotemporal regions. Larger infarcts are seen within the left caudate and putamen. Minimal petechial hemorrhage along the left caudate. No midline shift, ventriculomegaly or extra-axial fluid collection. No mass lesion. No abnormal enhancement. Vascular: Please see MRA head. Skull and upper cervical spine: Normal marrow signal. Sinuses/Orbits: Normal orbits. Ethmoid and right maxillary sinus mucosal thickening. No mastoid effusion. Other: None. MR CIRCLE OF WILLIS FINDINGS Anterior circulation: Left A1 segment aplasia. Patent, normal caliber anterior cerebral arteries. Patent normal caliber appearance of the internal carotid and middle cerebral arteries. No significant stenosis, proximal occlusion, aneurysm, or vascular malformation. Posterior circulation: Right dominant vertebral artery. Patent normal caliber V4 segments, PICA, basilar, superior cerebellar and posterior cerebral arteries. No significant stenosis, proximal occlusion, aneurysm, or vascular malformation. Venous sinuses: Patent. Anatomic variants: Bilateral posterior communicating arteries are either absent or  hypoplastic. MRA NECK FINDINGS Motion artifact limits evaluation. There is no high-grade narrowing or focal aneurysm involving the bilateral carotid arteries. No evidence of dissection. The bilateral vertebral arteries are patent and demonstrate antegrade flow. Dominant right vertebral artery. Mild left V1 segment narrowing. No evidence of high-grade narrowing or focal aneurysm. Three vessel aortic arch. 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 No high-grade narrowing, large vessel occlusion, dissection or aneurysm within the head or neck. Electronically Signed: By: Primitivo Gauze M.D. On: 07/17/2020 16:23   MR BRAIN W WO CONTRAST  Addendum Date: 07/17/2020   ADDENDUM REPORT: 07/17/2020 16:38 ADDENDUM: These results were called by telephone at the time of interpretation on 07/17/2020 at 4:38 pm to provider Ivor Costa , who verbally acknowledged these results. Electronically Signed   By: Primitivo Gauze M.D.   On: 07/17/2020 16:38   Result Date: 07/17/2020 CLINICAL DATA:  Stroke, follow-up EXAM: MR HEAD WITHOUT CONTRAST AND WITH MR CIRCLE OF WILLIS WITHOUT CONTRAST MRA OF THE NECK WITHOUT AND WITH CONTRAST TECHNIQUE: Multiplanar, multiecho pulse sequences of the brain, circle of willis and surrounding structures were obtained without intravenous contrast. Postcontrast imaging of the brain was obtained. Angiographic images of the neck were obtained using MRA technique without and with intravenous contrast. CONTRAST:  52mL GADAVIST GADOBUTROL 1 MMOL/ML IV SOLN COMPARISON:  07/17/2020 head CT. FINDINGS: MR HEAD FINDINGS Brain: Multifocal acute infarcts involving  the left frontoparietal and occipitotemporal regions. Larger infarcts are seen within the left caudate and putamen. Minimal petechial hemorrhage along the left caudate. No midline shift, ventriculomegaly or extra-axial fluid collection. No mass lesion. No abnormal enhancement.  Vascular: Please see MRA head. Skull and upper cervical spine: Normal marrow signal. Sinuses/Orbits: Normal orbits. Ethmoid and right maxillary sinus mucosal thickening. No mastoid effusion. Other: None. MR CIRCLE OF WILLIS FINDINGS Anterior circulation: Left A1 segment aplasia. Patent, normal caliber anterior cerebral arteries. Patent normal caliber appearance of the internal carotid and middle cerebral arteries. No significant stenosis, proximal occlusion, aneurysm, or vascular malformation. Posterior circulation: Right dominant vertebral artery. Patent normal caliber V4 segments, PICA, basilar, superior cerebellar and posterior cerebral arteries. No significant stenosis, proximal occlusion, aneurysm, or vascular malformation. Venous sinuses: Patent. Anatomic variants: Bilateral posterior communicating arteries are either absent or hypoplastic. MRA NECK FINDINGS Motion artifact limits evaluation. There is no high-grade narrowing or focal aneurysm involving the bilateral carotid arteries. No evidence of dissection. The bilateral vertebral arteries are patent and demonstrate antegrade flow. Dominant right vertebral artery. Mild left V1 segment narrowing. No evidence of high-grade narrowing or focal aneurysm. Three vessel aortic arch. 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 No high-grade narrowing, large vessel occlusion, dissection or aneurysm within the head or neck. Electronically Signed: By: Primitivo Gauze M.D. On: 07/17/2020 16:23        Scheduled Meds: .  stroke: mapping our early stages of recovery book   Does not apply Once  . ARIPiprazole  30 mg Oral QHS  . aspirin  300 mg Rectal Daily   Or  . aspirin EC  325 mg Oral Daily  . atorvastatin  40 mg Oral Daily  . benztropine  1 mg Oral QHS  . clopidogrel  300 mg Oral Once  . [START ON 07/19/2020] clopidogrel  75 mg Oral Daily  . divalproex  750 mg Oral QHS  . enoxaparin  (LOVENOX) injection  40 mg Subcutaneous Q24H  . folic acid  1 mg Oral Daily  . LORazepam  0-4 mg Intravenous Q6H   Followed by  . [START ON 07/19/2020] LORazepam  0-4 mg Intravenous Q12H  . mirtazapine  45 mg Oral QHS  . multivitamin with minerals  1 tablet Oral Daily  . nicotine  21 mg Transdermal Daily  . thiamine  100 mg Oral Daily   Or  . thiamine  100 mg Intravenous Daily  . Valbenazine Tosylate  1 capsule Oral QHS   Continuous Infusions:   LOS: 0 days    Time spent: 26 minutes    Sharen Hones, MD [facility]   To contact the attending provider between 7A-7P or the covering provider during after hours 7P-7A, please log into the web site [email or web address] and access using universal [place] password for that web site. If you do not have the password, please call the hospital operator.  07/18/2020, 9:53 AM

## 2020-07-18 NOTE — Consult Note (Signed)
[facility] Consultation Note  Patient ID: Brian Rios, MRN: [id number], DOB/AGE: [birth year] 39 y.o. Admit date: 07/17/2020   Date of Consult: 07/18/2020 Primary Physician: Luna Fuse, MD Primary Cardiologist: None  Chief Complaint:  Chief Complaint  Patient presents with  . Weakness   Reason for Consult: Stroke  HPI: 39 y.o. male with no apparent previous cardiovascular disease but has polysubstance abuse alcohol use and tobacco use on a regular basis.  The patient has had new onset significant strokelike symptoms for which she has had some improvements already.  The patient has had CT imaging suggesting on multiple areas of stroke possibly consistent with emboli.  The patient was placed on appropriate medication management.  Currently there is no evidence of rhythm disturbances palpitations irregular heartbeat and or concerns of congestive heart failure.  The patient has had an echocardiogram showing normal LV systolic function with ejection fraction of 60% and no apparent valvular heart disease.  We have discussed at length the possibility of further investigation of possibly emboli and other causes including telemetry and/or transesophageal echocardiogram.  He understands risk and benefits of transesophageal echocardiogram.  This includes a possibility death stroke heart attack infection bleeding side effects of medication other rhythm disturbances esophageal perforation.  He is at low risk for conscious sedation  Past Medical History:  Diagnosis Date  . Alcohol abuse   . Depression   . Drug abuse ([place])   . Tobacco abuse       Surgical History:  Past Surgical History:  Procedure Laterality Date  . ORIF ORBITAL FRACTURE       Home Meds: Prior to Admission medications   Medication Sig Start Date End Date Taking? Authorizing Provider  ARIPiprazole (ABILIFY) 30 MG tablet Take 30 mg by mouth at bedtime.   Yes [provider]  benztropine  (COGENTIN) 1 MG tablet Take 1 mg by mouth at bedtime.   Yes [provider]  divalproex (DEPAKOTE ER) 250 MG 24 hr tablet Take 750 mg by mouth at bedtime.   Yes [provider]  mirtazapine (REMERON) 30 MG tablet Take 45 mg by mouth at bedtime.   Yes [provider]  Valbenazine Tosylate (INGREZZA) 80 MG CAPS Take 1 capsule by mouth at bedtime.   Yes [provider]    Inpatient Medications:  .  stroke: mapping our early stages of recovery book   Does not apply Once  . ARIPiprazole  30 mg Oral QHS  . aspirin  300 mg Rectal Daily   Or  . aspirin EC  325 mg Oral Daily  . atorvastatin  40 mg Oral Daily  . benztropine  1 mg Oral QHS  . [START ON 07/19/2020] clopidogrel  75 mg Oral Daily  . divalproex  750 mg Oral QHS  . enoxaparin (LOVENOX) injection  40 mg Subcutaneous Q24H  . folic acid  1 mg Oral Daily  . LORazepam  0-4 mg Intravenous Q6H   Followed by  . [START ON 07/19/2020] LORazepam  0-4 mg Intravenous Q12H  . mirtazapine  45 mg Oral QHS  . multivitamin with minerals  1 tablet Oral Daily  . nicotine  21 mg Transdermal Daily  . thiamine  100 mg Oral Daily   Or  . thiamine  100 mg Intravenous Daily  . Valbenazine Tosylate  1 capsule Oral QHS     Allergies: No Known Allergies  Social History   Socioeconomic History  . Marital status: Single    Spouse name:  Not on file  . Number of children: Not on file  . Years of education: Not on file  . Highest education level: Not on file  Occupational History  . Not on file  Tobacco Use  . Smoking status: Current Every Day Smoker    Packs/day: 0.50    Years: 15.00    Pack years: 7.50    Types: Cigarettes  Substance and Sexual Activity  . Alcohol use: Yes  . Drug use: Yes    Types: "Crack" cocaine  . Sexual activity: Not on file  Other Topics Concern  . Not on file  Social History Narrative  . Not on file   Social Determinants of Health   Financial Resource Strain:   . Difficulty of  Paying Living Expenses: Not on file  Food Insecurity:   . Worried About Charity fundraiser in the Last Year: Not on file  . Ran Out of Food in the Last Year: Not on file  Transportation Needs:   . Lack of Transportation (Medical): Not on file  . Lack of Transportation (Non-Medical): Not on file  Physical Activity:   . Days of Exercise per Week: Not on file  . Minutes of Exercise per Session: Not on file  Stress:   . Feeling of Stress : Not on file  Social Connections:   . Frequency of Communication with Friends and Family: Not on file  . Frequency of Social Gatherings with Friends and Family: Not on file  . Attends Religious Services: Not on file  . Active Member of Clubs or Organizations: Not on file  . Attends Archivist Meetings: Not on file  . Marital Status: Not on file  Intimate Partner Violence:   . Fear of Current or Ex-Partner: Not on file  . Emotionally Abused: Not on file  . Physically Abused: Not on file  . Sexually Abused: Not on file     Family History  Problem Relation Age of Onset  . Alcoholism Mother   . Alcoholism Father      Review of Systems Positive for none Negative for: General:  chills, fever, night sweats or weight changes.  Cardiovascular: PND orthopnea syncope dizziness  Dermatological skin lesions rashes Respiratory: Cough congestion Urologic: Frequent urination urination at night and hematuria Abdominal: negative for nausea, vomiting, diarrhea, bright red blood per rectum, melena, or hematemesis Neurologic: negative for visual changes, and/or hearing changes  All other systems reviewed and are otherwise negative except as noted above.  Labs: No results for input(s): CKTOTAL, CKMB, TROPONINI in the last 72 hours. Lab Results  Component Value Date   WBC 9.3 07/17/2020   HGB 13.9 07/17/2020   HCT 42.5 07/17/2020   MCV 85.0 07/17/2020   PLT 260 07/17/2020    Recent Labs  Lab 07/17/20 1338  NA 138  K 4.1  CL 105  CO2 23   BUN 13  CREATININE 0.92  CALCIUM 9.3  GLUCOSE 102*   Lab Results  Component Value Date   CHOL 278 (H) 07/18/2020   HDL 51 07/18/2020   LDLCALC 200 (H) 07/18/2020   TRIG 133 07/18/2020   No results found for: DDIMER  Radiology/Studies:  CT HEAD WO CONTRAST  Result Date: 07/17/2020 CLINICAL DATA:  Mental status change EXAM: CT HEAD WITHOUT CONTRAST TECHNIQUE: Contiguous axial images were obtained from the base of the skull through the vertex without intravenous contrast. COMPARISON:  None. FINDINGS: Brain: Abnormal hypoattenuation and enlargement of the left caudate and swelling basal ganglia.  Additional ill-defined hypoattenuation within the overlying left corona radiata. No acute hemorrhage. Mild effacement of the frontal horn of the left lateral ventricle. Otherwise, no mass effect. No hydrocephalus. Vascular: No hyperdense vessel identified. Skull: Normal. Negative for fracture or focal lesion. Sinuses/Orbits: No acute finding. Other: No mastoid effusion. IMPRESSION: Abnormal hypoattenuation and enlargement of the left caudate and surrounding basal ganglia. Additional ill-defined hypoattenuation within the overlying left corona radiata. Findings are concerning for acute/early subacute infarct. Infiltrating tumor and encephalitis are additional differential considerations. Recommend MRI with and without contrast to further evaluate. These findings and recommendations were results were called by telephone at the time of interpretation on 07/17/2020 at 1:07 pm to provider Dr. Cherylann Banas, Who verbally acknowledged these results. Electronically Signed   By: Margaretha Sheffield MD   On: 07/17/2020 13:13   MR ANGIO HEAD WO CONTRAST  Addendum Date: 07/17/2020   ADDENDUM REPORT: 07/17/2020 16:38 ADDENDUM: These results were called by telephone at the time of interpretation on 07/17/2020 at 4:38 pm to provider Ivor Costa , who verbally acknowledged these results. Electronically Signed   By: Primitivo Gauze M.D.   On: 07/17/2020 16:38   Result Date: 07/17/2020 CLINICAL DATA:  Stroke, follow-up EXAM: MR HEAD WITHOUT CONTRAST AND WITH MR CIRCLE OF WILLIS WITHOUT CONTRAST MRA OF THE NECK WITHOUT AND WITH CONTRAST TECHNIQUE: Multiplanar, multiecho pulse sequences of the brain, circle of willis and surrounding structures were obtained without intravenous contrast. Postcontrast imaging of the brain was obtained. Angiographic images of the neck were obtained using MRA technique without and with intravenous contrast. CONTRAST:  60mL GADAVIST GADOBUTROL 1 MMOL/ML IV SOLN COMPARISON:  07/17/2020 head CT. FINDINGS: MR HEAD FINDINGS Brain: Multifocal acute infarcts involving the left frontoparietal and occipitotemporal regions. Larger infarcts are seen within the left caudate and putamen. Minimal petechial hemorrhage along the left caudate. No midline shift, ventriculomegaly or extra-axial fluid collection. No mass lesion. No abnormal enhancement. Vascular: Please see MRA head. Skull and upper cervical spine: Normal marrow signal. Sinuses/Orbits: Normal orbits. Ethmoid and right maxillary sinus mucosal thickening. No mastoid effusion. Other: None. MR CIRCLE OF WILLIS FINDINGS Anterior circulation: Left A1 segment aplasia. Patent, normal caliber anterior cerebral arteries. Patent normal caliber appearance of the internal carotid and middle cerebral arteries. No significant stenosis, proximal occlusion, aneurysm, or vascular malformation. Posterior circulation: Right dominant vertebral artery. Patent normal caliber V4 segments, PICA, basilar, superior cerebellar and posterior cerebral arteries. No significant stenosis, proximal occlusion, aneurysm, or vascular malformation. Venous sinuses: Patent. Anatomic variants: Bilateral posterior communicating arteries are either absent or hypoplastic. MRA NECK FINDINGS Motion artifact limits evaluation. There is no high-grade narrowing or focal aneurysm involving the bilateral  carotid arteries. No evidence of dissection. The bilateral vertebral arteries are patent and demonstrate antegrade flow. Dominant right vertebral artery. Mild left V1 segment narrowing. No evidence of high-grade narrowing or focal aneurysm. Three vessel aortic arch. 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 No high-grade narrowing, large vessel occlusion, dissection or aneurysm within the head or neck. Electronically Signed: By: Primitivo Gauze M.D. On: 07/17/2020 16:23   MR ANGIO NECK W WO CONTRAST  Addendum Date: 07/17/2020   ADDENDUM REPORT: 07/17/2020 16:38 ADDENDUM: These results were called by telephone at the time of interpretation on 07/17/2020 at 4:38 pm to provider Ivor Costa , who verbally acknowledged these results. Electronically Signed   By: Primitivo Gauze M.D.   On: 07/17/2020 16:38   Result Date: 07/17/2020 CLINICAL DATA:  Stroke,  follow-up EXAM: MR HEAD WITHOUT CONTRAST AND WITH MR CIRCLE OF WILLIS WITHOUT CONTRAST MRA OF THE NECK WITHOUT AND WITH CONTRAST TECHNIQUE: Multiplanar, multiecho pulse sequences of the brain, circle of willis and surrounding structures were obtained without intravenous contrast. Postcontrast imaging of the brain was obtained. Angiographic images of the neck were obtained using MRA technique without and with intravenous contrast. CONTRAST:  58mL GADAVIST GADOBUTROL 1 MMOL/ML IV SOLN COMPARISON:  07/17/2020 head CT. FINDINGS: MR HEAD FINDINGS Brain: Multifocal acute infarcts involving the left frontoparietal and occipitotemporal regions. Larger infarcts are seen within the left caudate and putamen. Minimal petechial hemorrhage along the left caudate. No midline shift, ventriculomegaly or extra-axial fluid collection. No mass lesion. No abnormal enhancement. Vascular: Please see MRA head. Skull and upper cervical spine: Normal marrow signal. Sinuses/Orbits: Normal orbits. Ethmoid and right maxillary  sinus mucosal thickening. No mastoid effusion. Other: None. MR CIRCLE OF WILLIS FINDINGS Anterior circulation: Left A1 segment aplasia. Patent, normal caliber anterior cerebral arteries. Patent normal caliber appearance of the internal carotid and middle cerebral arteries. No significant stenosis, proximal occlusion, aneurysm, or vascular malformation. Posterior circulation: Right dominant vertebral artery. Patent normal caliber V4 segments, PICA, basilar, superior cerebellar and posterior cerebral arteries. No significant stenosis, proximal occlusion, aneurysm, or vascular malformation. Venous sinuses: Patent. Anatomic variants: Bilateral posterior communicating arteries are either absent or hypoplastic. MRA NECK FINDINGS Motion artifact limits evaluation. There is no high-grade narrowing or focal aneurysm involving the bilateral carotid arteries. No evidence of dissection. The bilateral vertebral arteries are patent and demonstrate antegrade flow. Dominant right vertebral artery. Mild left V1 segment narrowing. No evidence of high-grade narrowing or focal aneurysm. Three vessel aortic arch. 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 No high-grade narrowing, large vessel occlusion, dissection or aneurysm within the head or neck. Electronically Signed: By: Primitivo Gauze M.D. On: 07/17/2020 16:23   MR BRAIN W WO CONTRAST  Addendum Date: 07/17/2020   ADDENDUM REPORT: 07/17/2020 16:38 ADDENDUM: These results were called by telephone at the time of interpretation on 07/17/2020 at 4:38 pm to provider Ivor Costa , who verbally acknowledged these results. Electronically Signed   By: Primitivo Gauze M.D.   On: 07/17/2020 16:38   Result Date: 07/17/2020 CLINICAL DATA:  Stroke, follow-up EXAM: MR HEAD WITHOUT CONTRAST AND WITH MR CIRCLE OF WILLIS WITHOUT CONTRAST MRA OF THE NECK WITHOUT AND WITH CONTRAST TECHNIQUE: Multiplanar, multiecho pulse  sequences of the brain, circle of willis and surrounding structures were obtained without intravenous contrast. Postcontrast imaging of the brain was obtained. Angiographic images of the neck were obtained using MRA technique without and with intravenous contrast. CONTRAST:  11mL GADAVIST GADOBUTROL 1 MMOL/ML IV SOLN COMPARISON:  07/17/2020 head CT. FINDINGS: MR HEAD FINDINGS Brain: Multifocal acute infarcts involving the left frontoparietal and occipitotemporal regions. Larger infarcts are seen within the left caudate and putamen. Minimal petechial hemorrhage along the left caudate. No midline shift, ventriculomegaly or extra-axial fluid collection. No mass lesion. No abnormal enhancement. Vascular: Please see MRA head. Skull and upper cervical spine: Normal marrow signal. Sinuses/Orbits: Normal orbits. Ethmoid and right maxillary sinus mucosal thickening. No mastoid effusion. Other: None. MR CIRCLE OF WILLIS FINDINGS Anterior circulation: Left A1 segment aplasia. Patent, normal caliber anterior cerebral arteries. Patent normal caliber appearance of the internal carotid and middle cerebral arteries. No significant stenosis, proximal occlusion, aneurysm, or vascular malformation. Posterior circulation: Right dominant vertebral artery. Patent normal caliber V4 segments, PICA, basilar, superior cerebellar and posterior cerebral  arteries. No significant stenosis, proximal occlusion, aneurysm, or vascular malformation. Venous sinuses: Patent. Anatomic variants: Bilateral posterior communicating arteries are either absent or hypoplastic. MRA NECK FINDINGS Motion artifact limits evaluation. There is no high-grade narrowing or focal aneurysm involving the bilateral carotid arteries. No evidence of dissection. The bilateral vertebral arteries are patent and demonstrate antegrade flow. Dominant right vertebral artery. Mild left V1 segment narrowing. No evidence of high-grade narrowing or focal aneurysm. Three vessel aortic  arch. 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 No high-grade narrowing, large vessel occlusion, dissection or aneurysm within the head or neck. Electronically Signed: By: Primitivo Gauze M.D. On: 07/17/2020 16:23   ECHOCARDIOGRAM COMPLETE  Result Date: 07/18/2020    ECHOCARDIOGRAM REPORT   Patient Name:   Brian Rios Date of Exam: 07/18/2020 Medical Rec #:  [id number]           Height:       69.0 in Accession #:    [id number]          Weight:       214.0 lb Date of Birth:  03-18-1981          BSA:          2.126 m Patient Age:    47 years            BP:           122/90 mmHg Patient Gender: M                   HR:           88 bpm. Exam Location:  [facility] Procedure: 2D Echo, Cardiac Doppler and Color Doppler Indications:     [place] 434.91  History:         Patient has no prior history of Echocardiogram examinations.                  Alcoho; abuse, drug abuse, tobacco abuse.  Sonographer:     Sherrie Sport RDCS (AE) Referring Phys:  [id number] Soledad Gerlach NIU Diagnosing Phys: Serafina Royals MD  Sonographer Comments: Suboptimal apical window. IMPRESSIONS  1. Left ventricular ejection fraction, by estimation, is 50 to 55%. The left ventricle has low normal function. The left ventricle has no regional wall motion abnormalities. Left ventricular diastolic parameters were normal.  2. Right ventricular systolic function is normal. The right ventricular size is normal.  3. The mitral valve is normal in structure. Trivial mitral valve regurgitation.  4. The aortic valve is normal in structure. Aortic valve regurgitation is not visualized. FINDINGS  Left Ventricle: Left ventricular ejection fraction, by estimation, is 50 to 55%. The left ventricle has low normal function. The left ventricle has no regional wall motion abnormalities. The left ventricular internal cavity size was normal in size. There is no left ventricular hypertrophy. Left ventricular diastolic  parameters were normal. Right Ventricle: The right ventricular size is normal. No increase in right ventricular wall thickness. Right ventricular systolic function is normal. Left Atrium: Left atrial size was normal in size. Right Atrium: Right atrial size was normal in size. Pericardium: There is no evidence of pericardial effusion. Mitral Valve: The mitral valve is normal in structure. Trivial mitral valve regurgitation. Tricuspid Valve: The tricuspid valve is normal in structure. Tricuspid valve regurgitation is trivial. Aortic Valve: The aortic valve is normal in structure. Aortic valve regurgitation is not visualized. Aortic valve mean gradient measures 3.5 mmHg. Aortic valve peak gradient measures  6.2 mmHg. Aortic valve area, by VTI measures 2.24 cm. Pulmonic Valve: The pulmonic valve was normal in structure. Pulmonic valve regurgitation is not visualized. Aorta: The aortic root and ascending aorta are structurally normal, with no evidence of dilitation. IAS/Shunts: No atrial level shunt detected by color flow Doppler.  LEFT VENTRICLE PLAX 2D LVIDd:         4.01 cm  Diastology LVIDs:         2.90 cm  LV e' medial:    7.94 cm/s LV PW:         1.27 cm  LV E/e' medial:  7.0 LV IVS:        0.93 cm  LV e' lateral:   8.27 cm/s LVOT diam:     2.00 cm  LV E/e' lateral: 6.7 LV SV:         51 LV SV Index:   24 LVOT Area:     3.14 cm  RIGHT VENTRICLE RV Basal diam:  3.50 cm RV S prime:     12.00 cm/s TAPSE (M-mode): 3.3 cm LEFT ATRIUM             Index       RIGHT ATRIUM           Index LA diam:        3.40 cm 1.60 cm/m  RA Area:     14.80 cm LA Vol (A2C):   36.1 ml 16.98 ml/m RA Volume:   37.70 ml  17.73 ml/m LA Vol (A4C):   33.3 ml 15.66 ml/m LA Biplane Vol: 34.7 ml 16.32 ml/m  AORTIC VALVE                   PULMONIC VALVE AV Area (Vmax):    2.13 cm    PV Vmax:        0.75 m/s AV Area (Vmean):   2.19 cm    PV Peak grad:   2.2 mmHg AV Area (VTI):     2.24 cm    RVOT Peak grad: 4 mmHg AV Vmax:           124.00  cm/s AV Vmean:          83.250 cm/s AV VTI:            0.226 m AV Peak Grad:      6.2 mmHg AV Mean Grad:      3.5 mmHg LVOT Vmax:         84.20 cm/s LVOT Vmean:        58.100 cm/s LVOT VTI:          0.161 m LVOT/AV VTI ratio: 0.71  AORTA Ao Root diam: 2.40 cm MITRAL VALVE               TRICUSPID VALVE MV Area (PHT): 4.04 cm    TR Peak grad:   20.6 mmHg MV Decel Time: 188 msec    TR Vmax:        227.00 cm/s MV E velocity: 55.70 cm/s MV A velocity: 91.30 cm/s  SHUNTS MV E/A ratio:  0.61        Systemic VTI:  0.16 m                            Systemic Diam: 2.00 cm Serafina Royals MD Electronically signed by Serafina Royals MD Signature Date/Time: 07/18/2020/1:13:01 PM    Final     EKG: Normal sinus  rhythm with nonspecific ST and T wave changes  Weights: Filed Weights   07/17/20 1217 07/18/20 1033  Weight: 97.1 kg 112.5 kg     Physical Exam: Blood pressure (!) 154/85, pulse 94, temperature 97.7 F (36.5 C), temperature source Oral, resp. rate 18, height 5\' 9"  (1.753 m), weight 112.5 kg, SpO2 97 %. Body mass index is 36.64 kg/m. General: Well developed, well nourished, in no acute distress. Head eyes ears nose throat: Normocephalic, atraumatic, sclera non-icteric, no xanthomas, nares are without discharge. No apparent thyromegaly and/or mass  Lungs: Normal respiratory effort.  no wheezes, no rales, no rhonchi.  Heart: RRR with normal S1 S2. no murmur gallop, no rub, PMI is normal size and placement, carotid upstroke normal without bruit, jugular venous pressure is normal Abdomen: Soft, non-tender, non-distended with normoactive bowel sounds. No hepatomegaly. No rebound/guarding. No obvious abdominal masses. Abdominal aorta is normal size without bruit Extremities: No edema. no cyanosis, no clubbing, no ulcers  Peripheral : 2+ bilateral upper extremity pulses, 2+ bilateral femoral pulses, 2+ bilateral dorsal pedal pulse Neuro: Alert and oriented. No facial asymmetry. No focal deficit. Moves all  extremities spontaneously.  Although concerns of residual stroke Musculoskeletal: Normal muscle tone   Psych:  Responds to questions appropriately with a normal affect.    Assessment: 39 year old male with strokelike symptoms and stroke by CT without evidence of myocardial infarction needing further evaluation and treatment options including a transesophageal echocardiogram and telemetry.  Patient understands risk and benefits of transesophageal echocardiogram and this includes the possibility of death stroke esophageal perforation sore throat and side effects of medication management.  The patient is at low risk for conscious sedation  Plan: 1.  Continue antiplatelet therapy for further risk reduction in stroke 2.  Proceed to transesophageal echocardiogram planned for tomorrow 3.  High intensity cholesterol therapy 4.  Abstinence of polysubstance abuse  Signed, Corey Skains M.D. [facility] 07/18/2020, 1:44 PM

## 2020-07-19 ENCOUNTER — Inpatient Hospital Stay
Admit: 2020-07-19 | Discharge: 2020-07-19 | Disposition: A | Discharge status: Home / Self Care | Payer: Self-pay | Attending: Internal Medicine | Admitting: Internal Medicine

## 2020-07-19 ENCOUNTER — Encounter
Admission: EM | Disposition: A | Discharge status: Home / Self Care | Payer: Self-pay | Source: Home / Self Care | Attending: Internal Medicine

## 2020-07-19 HISTORY — PX: TEE WITHOUT CARDIOVERSION: SHX5443

## 2020-07-19 SURGERY — ECHOCARDIOGRAM, TRANSESOPHAGEAL
Anesthesia: Moderate Sedation

## 2020-07-19 MED ORDER — SODIUM CHLORIDE 0.9 % IV SOLN: INTRAVENOUS | Status: DC

## 2020-07-19 MED ORDER — SODIUM CHLORIDE FLUSH 0.9 % IV SOLN
INTRAVENOUS | Status: AC
  Filled 2020-07-19: qty 10

## 2020-07-19 MED ORDER — BUTAMBEN-TETRACAINE-BENZOCAINE 2-2-14 % EX AERO
INHALATION_SPRAY | CUTANEOUS | Status: AC
  Filled 2020-07-19: qty 5

## 2020-07-19 MED ORDER — FENTANYL CITRATE (PF) 100 MCG/2ML IJ SOLN
INTRAMUSCULAR | Status: DC | PRN
  Administered 2020-07-19 (×2): 50 ug via INTRAVENOUS

## 2020-07-19 MED ORDER — LIDOCAINE VISCOUS HCL 2 % MT SOLN
OROMUCOSAL | Status: AC
  Administered 2020-07-19: 15 mL
  Filled 2020-07-19: qty 15

## 2020-07-19 MED ORDER — MIDAZOLAM HCL 5 MG/5ML IJ SOLN
INTRAMUSCULAR | Status: AC
  Filled 2020-07-19: qty 5

## 2020-07-19 MED ORDER — ASPIRIN EC 81 MG PO TBEC: 81.0000 mg | DELAYED_RELEASE_TABLET | Freq: Every day | ORAL | 0 refills | Status: AC

## 2020-07-19 MED ORDER — ATORVASTATIN CALCIUM 40 MG PO TABS
40.0000 mg | ORAL_TABLET | Freq: Every day | ORAL | 0 refills | Status: DC
Start: 2020-07-20 — End: 2020-12-07

## 2020-07-19 MED ORDER — FENTANYL CITRATE (PF) 100 MCG/2ML IJ SOLN
INTRAMUSCULAR | Status: AC
  Filled 2020-07-19: qty 2

## 2020-07-19 MED ORDER — MIDAZOLAM HCL 2 MG/2ML IJ SOLN
INTRAMUSCULAR | Status: DC | PRN
  Administered 2020-07-19 (×2): 2 mg via INTRAVENOUS

## 2020-07-19 MED ORDER — THIAMINE HCL 100 MG PO TABS: 100.0000 mg | ORAL_TABLET | Freq: Every day | ORAL | 0 refills | Status: DC

## 2020-07-19 MED ORDER — CLOPIDOGREL BISULFATE 75 MG PO TABS: 75.0000 mg | ORAL_TABLET | Freq: Every day | ORAL | 0 refills | Status: AC

## 2020-07-19 NOTE — Discharge Summary (Signed)
Physician Discharge Summary  Patient ID: Brian Rios MRN: [id number] DOB/AGE: 1980/12/24 39 y.o.  Admit date: 07/17/2020 Discharge date: 07/19/2020  Admission Diagnoses:  Discharge Diagnoses:  Principal Problem:   Stroke [facility]) Active Problems:   Major depressive disorder, recurrent episode, severe ([place])   Alcohol use disorder, severe, dependence ([place])   Stimulant use disorder   Tobacco use disorder   Drug abuse ([place])   Embolic stroke [facility])   Discharged Condition: good  Hospital Course:  Brian Johnsonis a 39 y.o.malewith medical history significant ofpolysubstance abuse, alcohol abuse, tobacco abuse, depression, who presents with right-sided weakness and slurred speech.  MRI showed multifocal acute infarcts involving the left cerebrum and left basal ganglia, likely embolic.  9/27.  Patient symptom improved.  Pending echocardiogram, TEE if transthoracic echocardiogram does not see any blood clots.  9/28.  TEE did not identify any source of stroke.  Discussed with neurology, patient is medically stable to be discharged, he will be treated with Lipitor, aspirin and a 21 days of Plavix.  Also set event cardiac monitor.  Follow-up with neurology and cardiology in the near future.  Follow-up with PCP in the future.   #1.  Left brain embolic stroke.   Discussed with neurology, TEE negative for clots. Continue aspirin, Plavix, Lipitor.  #2.  Alcohol abuse. No evidence of withdrawal.   3.  Polydrug abuse including cocaine. Patient does not use IV drugs.   Consults: cardiology and neurology  Significant Diagnostic Studies:  Echo: 1. Left ventricular ejection fraction, by estimation, is 50 to 55%. The left ventricle has low normal function. The left ventricle has no regional wall motion abnormalities. Left ventricular diastolic parameters were normal. 2. Right ventricular systolic function is normal. The right ventricular size is normal. 3. The mitral  valve is normal in structure. Trivial mitral valve regurgitation. 4. The aortic valve is normal in structure. Aortic valve regurgitation is not visualized.  MR HEAD WITHOUT CONTRAST AND WITH  MR CIRCLE OF WILLIS WITHOUT CONTRAST  MRA OF THE NECK WITHOUT AND WITH CONTRAST  TECHNIQUE: Multiplanar, multiecho pulse sequences of the brain, circle of willis and surrounding structures were obtained without intravenous contrast. Postcontrast imaging of the brain was obtained. Angiographic images of the neck were obtained using MRA technique without and with intravenous contrast.  CONTRAST:  58mL GADAVIST GADOBUTROL 1 MMOL/ML IV SOLN  COMPARISON:  07/17/2020 head CT.  FINDINGS: MR HEAD FINDINGS  Brain: Multifocal acute infarcts involving the left frontoparietal and occipitotemporal regions. Larger infarcts are seen within the left caudate and putamen. Minimal petechial hemorrhage along the left caudate. No midline shift, ventriculomegaly or extra-axial fluid collection. No mass lesion. No abnormal enhancement.  Vascular: Please see MRA head.  Skull and upper cervical spine: Normal marrow signal.  Sinuses/Orbits: Normal orbits. Ethmoid and right maxillary sinus mucosal thickening. No mastoid effusion.  Other: None.  MR CIRCLE OF WILLIS FINDINGS  Anterior circulation: Left A1 segment aplasia. Patent, normal caliber anterior cerebral arteries. Patent normal caliber appearance of the internal carotid and middle cerebral arteries. No significant stenosis, proximal occlusion, aneurysm, or vascular malformation.  Posterior circulation: Right dominant vertebral artery. Patent normal caliber V4 segments, PICA, basilar, superior cerebellar and posterior cerebral arteries. No significant stenosis, proximal occlusion, aneurysm, or vascular malformation.  Venous sinuses: Patent.  Anatomic variants: Bilateral posterior communicating arteries are either absent or  hypoplastic.  MRA NECK FINDINGS  Motion artifact limits evaluation.  There is no high-grade narrowing or focal aneurysm involving the bilateral carotid arteries. No evidence of  dissection.  The bilateral vertebral arteries are patent and demonstrate antegrade flow. Dominant right vertebral artery. Mild left V1 segment narrowing. No evidence of high-grade narrowing or focal aneurysm.  Three vessel aortic arch.  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  No high-grade narrowing, large vessel occlusion, dissection or aneurysm within the head or neck.  Electronically Signed: By: Primitivo Gauze M.D. On: 07/17/2020 16:23   Treatments: Aspirin Plavix  Discharge Exam: Blood pressure 124/80, pulse 78, temperature 97.9 F (36.6 C), temperature source Oral, resp. rate 16, height 5\' 9"  (1.753 m), weight 112.5 kg, SpO2 96 %. General appearance: alert and cooperative Resp: clear to auscultation bilaterally Cardio: regular rate and rhythm, S1, S2 normal, no murmur, click, rub or gallop GI: soft, non-tender; bowel sounds normal; no masses,  no organomegaly Extremities: extremities normal, atraumatic, no cyanosis or edema  Disposition: Discharge disposition: 01-Home or Self Care       Discharge Instructions    Diet - low sodium heart healthy   Complete by: As directed    Increase activity slowly   Complete by: As directed      Allergies as of 07/19/2020   No Known Allergies     Medication List    TAKE these medications   ARIPiprazole 30 MG tablet Commonly known as: ABILIFY Take 30 mg by mouth at bedtime.   aspirin EC 81 MG tablet Take 1 tablet (81 mg total) by mouth daily. Swallow whole.   atorvastatin 40 MG tablet Commonly known as: LIPITOR Take 1 tablet (40 mg total) by mouth daily. Start taking on: July 20, 2020   benztropine 1 MG tablet Commonly known as: COGENTIN Take 1 mg  by mouth at bedtime.   clopidogrel 75 MG tablet Commonly known as: Plavix Take 1 tablet (75 mg total) by mouth daily for 21 days.   divalproex 250 MG 24 hr tablet Commonly known as: DEPAKOTE ER Take 750 mg by mouth at bedtime.   Ingrezza 80 MG Caps Generic drug: Valbenazine Tosylate Take 1 capsule by mouth at bedtime.   mirtazapine 30 MG tablet Commonly known as: REMERON Take 45 mg by mouth at bedtime.   thiamine 100 MG tablet Take 1 tablet (100 mg total) by mouth daily. Start taking on: July 20, 2020       Follow-up Information    Corey Skains, MD Follow up.   Specialty: Cardiology Contact information: [facility] [place] [address] [phone]        Luna Fuse, MD Follow up.   Specialty: Pediatrics Contact information: [phone] [place] [address] [phone]        [facility] Follow up in 2 week(s).   Contact information: [place] [phone]              Signed: Sharen Hones 07/19/2020, 2:08 PM

## 2020-07-19 NOTE — CV Procedure (Signed)
Transesophageal echocardiogram preliminary report  Brian Rios [id number] 09/14/1981  Preliminary diagnosis  Stroke with possible embolic source  Postprocedural diagnosis  Normal LV systolic function no apparent source of embolus  Time out A timeout was performed by the nursing staff and physicians specifically identifying the procedure performed, identification of the patient, the type of sedation, all allergies and medications, all pertinent medical history, and presedation assessment of nasopharynx. The patient and or family understand the risks of the procedure including the rare risks of death, stroke, heart attack, esophogeal perforation, sore throat, and reaction to medications given.  Moderate sedation During this procedure the patient has received Versed 4 milligrams and fentanyl 100 micrograms to achieve appropriate moderate sedation.  The patient had continued monitoring of heart rate, oxygenation, blood pressure, respiratory rate, and extent of signs of sedation throughout the entire procedure.  The patient received this moderate sedation over a period of 12 minutes.  Both the nursing staff and I were present during the procedure when the patient had moderate sedation for 100% of the time.  Treatment considerations  No further cardiac intervention due to no apparent cardiac source of embolus  For further details of transesophageal echocardiogram please refer to final report.  Signed,  Corey Skains M.D. [facility] 07/19/2020 1:42 PM

## 2020-07-19 NOTE — Progress Notes (Signed)
[facility] Encounter Note  Patient: Brian Rios / Admit Date: 07/17/2020 / Date of Encounter: 07/19/2020, 1:43 PM   Subjective: Patient overall has done very well since admission to the hospital. He has had significant improvements of symptoms and resolution of stroke like issues. The patient has been placed on appropriate medication management for further risk reduction in stroke including high intensity cholesterol therapy and antiplatelet therapy. Transesophageal echocardiogram has shown no evidence of primary source of embolism with no evidence of PFO, ASD or other valvular heart disease. Normal ejection fraction of 65% Telemetry has shown no evidence of atrial fibrillation  Review of Systems: Positive for: None Negative for: Vision change, hearing change, syncope, dizziness, nausea, vomiting,diarrhea, bloody stool, stomach pain, cough, congestion, diaphoresis, urinary frequency, urinary pain,skin lesions, skin rashes Others previously listed  Objective: Telemetry: Normal sinus rhythm Physical Exam: Blood pressure 117/65, pulse 85, temperature 98.3 F (36.8 C), temperature source Oral, resp. rate 16, height 5\' 9"  (1.753 m), weight 112.5 kg, SpO2 91 %. Body mass index is 36.64 kg/m. General: Well developed, well nourished, in no acute distress. Head: Normocephalic, atraumatic, sclera non-icteric, no xanthomas, nares are without discharge. Neck: No apparent masses Lungs: Normal respirations with no wheezes, no rhonchi, no rales , no crackles   Heart: Regular rate and rhythm, normal S1 S2, no murmur, no rub, no gallop, PMI is normal size and placement, carotid upstroke normal without bruit, jugular venous pressure normal Abdomen: Soft, non-tender, non-distended with normoactive bowel sounds. No hepatosplenomegaly. Abdominal aorta is normal size without bruit Extremities: No edema, no clubbing, no cyanosis, no ulcers,  Peripheral: 2+ radial, 2+ femoral, 2+  dorsal pedal pulses Neuro: Alert and oriented. Moves all extremities spontaneously. Psych:  Responds to questions appropriately with a normal affect.  No intake or output data in the 24 hours ending 07/19/20 1343  Inpatient Medications:  . [MAR Hold] ARIPiprazole  30 mg Oral QHS  . [MAR Hold] aspirin  300 mg Rectal Daily   Or  . [MAR Hold] aspirin EC  325 mg Oral Daily  . [MAR Hold] atorvastatin  40 mg Oral Daily  . [MAR Hold] benztropine  1 mg Oral QHS  . [MAR Hold] clopidogrel  75 mg Oral Daily  . [MAR Hold] divalproex  750 mg Oral QHS  . [MAR Hold] enoxaparin (LOVENOX) injection  40 mg Subcutaneous Q24H  . fentaNYL      . [MAR Hold] folic acid  1 mg Oral Daily  . LORazepam  0-4 mg Intravenous Q6H   Followed by  . [MAR Hold] LORazepam  0-4 mg Intravenous Q12H  . midazolam      . [MAR Hold] mirtazapine  45 mg Oral QHS  . [MAR Hold] multivitamin with minerals  1 tablet Oral Daily  . [MAR Hold] nicotine  21 mg Transdermal Daily  . sodium chloride flush      . [MAR Hold] thiamine  100 mg Oral Daily   Or  . [MAR Hold] thiamine  100 mg Intravenous Daily  . [MAR Hold] Valbenazine Tosylate  1 capsule Oral QHS   Infusions:  . sodium chloride 20 mL/hr at 07/19/20 1255    Labs: Recent Labs    07/17/20 1338  NA 138  K 4.1  CL 105  CO2 23  GLUCOSE 102*  BUN 13  CREATININE 0.92  CALCIUM 9.3   No results for input(s): AST, ALT, ALKPHOS, BILITOT, PROT, ALBUMIN in the last 72 hours. Recent Labs    07/17/20 1221  WBC 9.3  HGB 13.9  HCT 42.5  MCV 85.0  PLT 260   No results for input(s): CKTOTAL, CKMB, TROPONINI in the last 72 hours. Invalid input(s): POCBNP Recent Labs    07/18/20 0502  HGBA1C 6.0*     Weights: Filed Weights   07/17/20 1217 07/18/20 1033  Weight: 97.1 kg 112.5 kg     Radiology/Studies:  CT HEAD WO CONTRAST  Result Date: 07/17/2020 CLINICAL DATA:  Mental status change EXAM: CT HEAD WITHOUT CONTRAST TECHNIQUE: Contiguous axial images were  obtained from the base of the skull through the vertex without intravenous contrast. COMPARISON:  None. FINDINGS: Brain: Abnormal hypoattenuation and enlargement of the left caudate and swelling basal ganglia. Additional ill-defined hypoattenuation within the overlying left corona radiata. No acute hemorrhage. Mild effacement of the frontal horn of the left lateral ventricle. Otherwise, no mass effect. No hydrocephalus. Vascular: No hyperdense vessel identified. Skull: Normal. Negative for fracture or focal lesion. Sinuses/Orbits: No acute finding. Other: No mastoid effusion. IMPRESSION: Abnormal hypoattenuation and enlargement of the left caudate and surrounding basal ganglia. Additional ill-defined hypoattenuation within the overlying left corona radiata. Findings are concerning for acute/early subacute infarct. Infiltrating tumor and encephalitis are additional differential considerations. Recommend MRI with and without contrast to further evaluate. These findings and recommendations were results were called by telephone at the time of interpretation on 07/17/2020 at 1:07 pm to provider Dr. Cherylann Banas, Who verbally acknowledged these results. Electronically Signed   By: Margaretha Sheffield MD   On: 07/17/2020 13:13   MR ANGIO HEAD WO CONTRAST  Addendum Date: 07/17/2020   ADDENDUM REPORT: 07/17/2020 16:38 ADDENDUM: These results were called by telephone at the time of interpretation on 07/17/2020 at 4:38 pm to provider Ivor Costa , who verbally acknowledged these results. Electronically Signed   By: Primitivo Gauze M.D.   On: 07/17/2020 16:38   Result Date: 07/17/2020 CLINICAL DATA:  Stroke, follow-up EXAM: MR HEAD WITHOUT CONTRAST AND WITH MR CIRCLE OF WILLIS WITHOUT CONTRAST MRA OF THE NECK WITHOUT AND WITH CONTRAST TECHNIQUE: Multiplanar, multiecho pulse sequences of the brain, circle of willis and surrounding structures were obtained without intravenous contrast. Postcontrast imaging of the brain was  obtained. Angiographic images of the neck were obtained using MRA technique without and with intravenous contrast. CONTRAST:  87mL GADAVIST GADOBUTROL 1 MMOL/ML IV SOLN COMPARISON:  07/17/2020 head CT. FINDINGS: MR HEAD FINDINGS Brain: Multifocal acute infarcts involving the left frontoparietal and occipitotemporal regions. Larger infarcts are seen within the left caudate and putamen. Minimal petechial hemorrhage along the left caudate. No midline shift, ventriculomegaly or extra-axial fluid collection. No mass lesion. No abnormal enhancement. Vascular: Please see MRA head. Skull and upper cervical spine: Normal marrow signal. Sinuses/Orbits: Normal orbits. Ethmoid and right maxillary sinus mucosal thickening. No mastoid effusion. Other: None. MR CIRCLE OF WILLIS FINDINGS Anterior circulation: Left A1 segment aplasia. Patent, normal caliber anterior cerebral arteries. Patent normal caliber appearance of the internal carotid and middle cerebral arteries. No significant stenosis, proximal occlusion, aneurysm, or vascular malformation. Posterior circulation: Right dominant vertebral artery. Patent normal caliber V4 segments, PICA, basilar, superior cerebellar and posterior cerebral arteries. No significant stenosis, proximal occlusion, aneurysm, or vascular malformation. Venous sinuses: Patent. Anatomic variants: Bilateral posterior communicating arteries are either absent or hypoplastic. MRA NECK FINDINGS Motion artifact limits evaluation. There is no high-grade narrowing or focal aneurysm involving the bilateral carotid arteries. No evidence of dissection. The bilateral vertebral arteries are patent and demonstrate antegrade flow. Dominant right vertebral artery. Mild left V1  segment narrowing. No evidence of high-grade narrowing or focal aneurysm. Three vessel aortic arch. 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 No high-grade narrowing, large vessel  occlusion, dissection or aneurysm within the head or neck. Electronically Signed: By: Primitivo Gauze M.D. On: 07/17/2020 16:23   MR ANGIO NECK W WO CONTRAST  Addendum Date: 07/17/2020   ADDENDUM REPORT: 07/17/2020 16:38 ADDENDUM: These results were called by telephone at the time of interpretation on 07/17/2020 at 4:38 pm to provider Ivor Costa , who verbally acknowledged these results. Electronically Signed   By: Primitivo Gauze M.D.   On: 07/17/2020 16:38   Result Date: 07/17/2020 CLINICAL DATA:  Stroke, follow-up EXAM: MR HEAD WITHOUT CONTRAST AND WITH MR CIRCLE OF WILLIS WITHOUT CONTRAST MRA OF THE NECK WITHOUT AND WITH CONTRAST TECHNIQUE: Multiplanar, multiecho pulse sequences of the brain, circle of willis and surrounding structures were obtained without intravenous contrast. Postcontrast imaging of the brain was obtained. Angiographic images of the neck were obtained using MRA technique without and with intravenous contrast. CONTRAST:  56mL GADAVIST GADOBUTROL 1 MMOL/ML IV SOLN COMPARISON:  07/17/2020 head CT. FINDINGS: MR HEAD FINDINGS Brain: Multifocal acute infarcts involving the left frontoparietal and occipitotemporal regions. Larger infarcts are seen within the left caudate and putamen. Minimal petechial hemorrhage along the left caudate. No midline shift, ventriculomegaly or extra-axial fluid collection. No mass lesion. No abnormal enhancement. Vascular: Please see MRA head. Skull and upper cervical spine: Normal marrow signal. Sinuses/Orbits: Normal orbits. Ethmoid and right maxillary sinus mucosal thickening. No mastoid effusion. Other: None. MR CIRCLE OF WILLIS FINDINGS Anterior circulation: Left A1 segment aplasia. Patent, normal caliber anterior cerebral arteries. Patent normal caliber appearance of the internal carotid and middle cerebral arteries. No significant stenosis, proximal occlusion, aneurysm, or vascular malformation. Posterior circulation: Right dominant vertebral artery.  Patent normal caliber V4 segments, PICA, basilar, superior cerebellar and posterior cerebral arteries. No significant stenosis, proximal occlusion, aneurysm, or vascular malformation. Venous sinuses: Patent. Anatomic variants: Bilateral posterior communicating arteries are either absent or hypoplastic. MRA NECK FINDINGS Motion artifact limits evaluation. There is no high-grade narrowing or focal aneurysm involving the bilateral carotid arteries. No evidence of dissection. The bilateral vertebral arteries are patent and demonstrate antegrade flow. Dominant right vertebral artery. Mild left V1 segment narrowing. No evidence of high-grade narrowing or focal aneurysm. Three vessel aortic arch. 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 No high-grade narrowing, large vessel occlusion, dissection or aneurysm within the head or neck. Electronically Signed: By: Primitivo Gauze M.D. On: 07/17/2020 16:23   MR BRAIN W WO CONTRAST  Addendum Date: 07/17/2020   ADDENDUM REPORT: 07/17/2020 16:38 ADDENDUM: These results were called by telephone at the time of interpretation on 07/17/2020 at 4:38 pm to provider Ivor Costa , who verbally acknowledged these results. Electronically Signed   By: Primitivo Gauze M.D.   On: 07/17/2020 16:38   Result Date: 07/17/2020 CLINICAL DATA:  Stroke, follow-up EXAM: MR HEAD WITHOUT CONTRAST AND WITH MR CIRCLE OF WILLIS WITHOUT CONTRAST MRA OF THE NECK WITHOUT AND WITH CONTRAST TECHNIQUE: Multiplanar, multiecho pulse sequences of the brain, circle of willis and surrounding structures were obtained without intravenous contrast. Postcontrast imaging of the brain was obtained. Angiographic images of the neck were obtained using MRA technique without and with intravenous contrast. CONTRAST:  97mL GADAVIST GADOBUTROL 1 MMOL/ML IV SOLN COMPARISON:  07/17/2020 head CT. FINDINGS: MR HEAD FINDINGS Brain: Multifocal acute infarcts  involving  the left frontoparietal and occipitotemporal regions. Larger infarcts are seen within the left caudate and putamen. Minimal petechial hemorrhage along the left caudate. No midline shift, ventriculomegaly or extra-axial fluid collection. No mass lesion. No abnormal enhancement. Vascular: Please see MRA head. Skull and upper cervical spine: Normal marrow signal. Sinuses/Orbits: Normal orbits. Ethmoid and right maxillary sinus mucosal thickening. No mastoid effusion. Other: None. MR CIRCLE OF WILLIS FINDINGS Anterior circulation: Left A1 segment aplasia. Patent, normal caliber anterior cerebral arteries. Patent normal caliber appearance of the internal carotid and middle cerebral arteries. No significant stenosis, proximal occlusion, aneurysm, or vascular malformation. Posterior circulation: Right dominant vertebral artery. Patent normal caliber V4 segments, PICA, basilar, superior cerebellar and posterior cerebral arteries. No significant stenosis, proximal occlusion, aneurysm, or vascular malformation. Venous sinuses: Patent. Anatomic variants: Bilateral posterior communicating arteries are either absent or hypoplastic. MRA NECK FINDINGS Motion artifact limits evaluation. There is no high-grade narrowing or focal aneurysm involving the bilateral carotid arteries. No evidence of dissection. The bilateral vertebral arteries are patent and demonstrate antegrade flow. Dominant right vertebral artery. Mild left V1 segment narrowing. No evidence of high-grade narrowing or focal aneurysm. Three vessel aortic arch. 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 No high-grade narrowing, large vessel occlusion, dissection or aneurysm within the head or neck. Electronically Signed: By: Primitivo Gauze M.D. On: 07/17/2020 16:23   ECHOCARDIOGRAM COMPLETE  Result Date: 07/18/2020    ECHOCARDIOGRAM REPORT   Patient Name:   EMARI DEMMER Date of Exam:  07/18/2020 Medical Rec #:  [id number]           Height:       69.0 in Accession #:    [id number]          Weight:       214.0 lb Date of Birth:  1981/06/06          BSA:          2.126 m Patient Age:    51 years            BP:           122/90 mmHg Patient Gender: M                   HR:           88 bpm. Exam Location:  [facility] Procedure: 2D Echo, Cardiac Doppler and Color Doppler Indications:     Sroke 434.91  History:         Patient has no prior history of Echocardiogram examinations.                  Alcoho; abuse, drug abuse, tobacco abuse.  Sonographer:     Sherrie Sport RDCS (AE) Referring Phys:  [id number] Soledad Gerlach NIU Diagnosing Phys: Serafina Royals MD  Sonographer Comments: Suboptimal apical window. IMPRESSIONS  1. Left ventricular ejection fraction, by estimation, is 50 to 55%. The left ventricle has low normal function. The left ventricle has no regional wall motion abnormalities. Left ventricular diastolic parameters were normal.  2. Right ventricular systolic function is normal. The right ventricular size is normal.  3. The mitral valve is normal in structure. Trivial mitral valve regurgitation.  4. The aortic valve is normal in structure. Aortic valve regurgitation is not visualized. FINDINGS  Left Ventricle: Left ventricular ejection fraction, by estimation, is 50 to 55%. The left ventricle has low normal function. The left ventricle has no regional wall motion abnormalities. The left ventricular  internal cavity size was normal in size. There is no left ventricular hypertrophy. Left ventricular diastolic parameters were normal. Right Ventricle: The right ventricular size is normal. No increase in right ventricular wall thickness. Right ventricular systolic function is normal. Left Atrium: Left atrial size was normal in size. Right Atrium: Right atrial size was normal in size. Pericardium: There is no evidence of pericardial effusion. Mitral Valve: The mitral valve is normal in structure. Trivial mitral valve  regurgitation. Tricuspid Valve: The tricuspid valve is normal in structure. Tricuspid valve regurgitation is trivial. Aortic Valve: The aortic valve is normal in structure. Aortic valve regurgitation is not visualized. Aortic valve mean gradient measures 3.5 mmHg. Aortic valve peak gradient measures 6.2 mmHg. Aortic valve area, by VTI measures 2.24 cm. Pulmonic Valve: The pulmonic valve was normal in structure. Pulmonic valve regurgitation is not visualized. Aorta: The aortic root and ascending aorta are structurally normal, with no evidence of dilitation. IAS/Shunts: No atrial level shunt detected by color flow Doppler.  LEFT VENTRICLE PLAX 2D LVIDd:         4.01 cm  Diastology LVIDs:         2.90 cm  LV e' medial:    7.94 cm/s LV PW:         1.27 cm  LV E/e' medial:  7.0 LV IVS:        0.93 cm  LV e' lateral:   8.27 cm/s LVOT diam:     2.00 cm  LV E/e' lateral: 6.7 LV SV:         51 LV SV Index:   24 LVOT Area:     3.14 cm  RIGHT VENTRICLE RV Basal diam:  3.50 cm RV S prime:     12.00 cm/s TAPSE (M-mode): 3.3 cm LEFT ATRIUM             Index       RIGHT ATRIUM           Index LA diam:        3.40 cm 1.60 cm/m  RA Area:     14.80 cm LA Vol (A2C):   36.1 ml 16.98 ml/m RA Volume:   37.70 ml  17.73 ml/m LA Vol (A4C):   33.3 ml 15.66 ml/m LA Biplane Vol: 34.7 ml 16.32 ml/m  AORTIC VALVE                   PULMONIC VALVE AV Area (Vmax):    2.13 cm    PV Vmax:        0.75 m/s AV Area (Vmean):   2.19 cm    PV Peak grad:   2.2 mmHg AV Area (VTI):     2.24 cm    RVOT Peak grad: 4 mmHg AV Vmax:           124.00 cm/s AV Vmean:          83.250 cm/s AV VTI:            0.226 m AV Peak Grad:      6.2 mmHg AV Mean Grad:      3.5 mmHg LVOT Vmax:         84.20 cm/s LVOT Vmean:        58.100 cm/s LVOT VTI:          0.161 m LVOT/AV VTI ratio: 0.71  AORTA Ao Root diam: 2.40 cm MITRAL VALVE  TRICUSPID VALVE MV Area (PHT): 4.04 cm    TR Peak grad:   20.6 mmHg MV Decel Time: 188 msec    TR Vmax:        227.00 cm/s  MV E velocity: 55.70 cm/s MV A velocity: 91.30 cm/s  SHUNTS MV E/A ratio:  0.61        Systemic VTI:  0.16 m                            Systemic Diam: 2.00 cm Serafina Royals MD Electronically signed by Serafina Royals MD Signature Date/Time: 07/18/2020/1:13:01 PM    Final      Assessment and Recommendation  39 y.o. male with acute evidence of stroke of unknown etiology with no apparent cardiac source of stroke with a transesophageal echocardiogram showing normal LV systolic function with ejection fraction of 65% and no evidence of PFO or ASD or atrial thrombus. Thoracic aorta is normal 1. Continue risk factor modification to reduce possible future stroke issues including high intensity cholesterol therapy and antiplatelet therapy and hypertension medication management 2 no further diagnostic testing necessary at this time 3. Patient okay for discharged home from cardiac standpoint with follow-up in 1 to 2 weeks  Signed, Serafina Royals M.D. FACC

## 2020-07-19 NOTE — Progress Notes (Signed)
*  PRELIMINARY RESULTS* Echocardiogram Echocardiogram Transesophageal has been performed.  Sherrie Sport 07/19/2020, 1:37 PM

## 2020-07-19 NOTE — Progress Notes (Signed)
Pt has order for cardiac monitor event. Per MD Nehemiah Massed this can be set up as outpatient. MD Zhang notified.

## 2020-07-19 NOTE — Progress Notes (Signed)
Pt verbalizes understanding of discharge instructions. Pt encouraged to keep follow-up appts. Personal belongings returned to pt. Pt escorted out by this Probation officer.

## 2020-07-20 ENCOUNTER — Encounter: Payer: Self-pay | Admitting: Internal Medicine

## 2020-07-27 ENCOUNTER — Other Ambulatory Visit: Payer: Self-pay | Admitting: Psychiatry

## 2020-10-27 ENCOUNTER — Other Ambulatory Visit: Payer: Self-pay | Admitting: Psychiatry

## 2020-11-16 ENCOUNTER — Ambulatory Visit: Payer: Medicaid Other | Admitting: Pharmacist

## 2020-11-16 ENCOUNTER — Encounter: Payer: Self-pay | Admitting: Pharmacist

## 2020-11-16 ENCOUNTER — Other Ambulatory Visit: Payer: Self-pay

## 2020-11-16 DIAGNOSIS — Z79899 Other long term (current) drug therapy: Secondary | ICD-10-CM

## 2020-11-16 NOTE — Progress Notes (Addendum)
Medication Management Clinic Visit Note  Patient: Brian Rios MRN: [id number] Date of Birth: September 02, 1981 PCP: No primary care provider on file.   Brian Rios 40 y.o. male presents for a telephone visit for medication management today.   There were no vitals taken for this visit.  Patient Information   Past Medical History:  Diagnosis Date  . Alcohol abuse   . Depression   . Drug abuse ([place])   . Tobacco abuse       Past Surgical History:  Procedure Laterality Date  . ORIF ORBITAL FRACTURE    . TEE WITHOUT CARDIOVERSION N/A 07/19/2020   Procedure: TRANSESOPHAGEAL ECHOCARDIOGRAM (TEE);  Surgeon: Corey Skains, MD;  Location: [facility] ORS;  Service: Cardiovascular;  Laterality: N/A;     Family History  Problem Relation Age of Onset  . Alcoholism Mother   . Alcoholism Father     New Diagnoses (since last visit): N/A  Family Support: Good  Lifestyle Diet: Breakfast: skips - usually not hungry  Lunch: skips - not hungry Dinner: protein, potatoes Drinks: kool-aid    Current Exercise Habits: Home exercise routine, Type of exercise: walking, Time (Minutes): 45, Frequency (Times/Week): 2, Weekly Exercise (Minutes/Week): 90, Intensity: Mild       Social History   Substance and Sexual Activity  Alcohol Use Yes  . Alcohol/week: 7.0 standard drinks  . Types: 7 Cans of beer per week      Social History   Tobacco Use  Smoking Status Current Every Day Smoker  . Years: 15.00  . Types: Cigarettes  Smokeless Tobacco Current User  Tobacco Comment   1 pack every 3 days      Health Maintenance  Topic Date Due  . Hepatitis C Screening  Never done  . COVID-19 Vaccine (1) Never done  . INFLUENZA VACCINE  Never done  . TETANUS/TDAP  04/05/2026  . HIV Screening  Completed   Health Maintenance/Date Completed  Last ED visit: 07/17/2020 Last Visit to PCP: pt does not have a PCP Dental Exam: unknown Eye Exam: "been a long time" Prostate Exam:  unknown Colonoscopy: unknown Flu Vaccine: due COVID-19 Vaccine: pt contemplating getting  Outpatient Encounter Medications as of 11/16/2020  Medication Sig  . ARIPiprazole (ABILIFY) 30 MG tablet Take 30 mg by mouth at bedtime.  Marland Kitchen atorvastatin (LIPITOR) 40 MG tablet Take 1 tablet (40 mg total) by mouth daily.  . benztropine (COGENTIN) 1 MG tablet Take 1 mg by mouth at bedtime.  . divalproex (DEPAKOTE ER) 250 MG 24 hr tablet Take 750 mg by mouth at bedtime.  Marland Kitchen escitalopram (LEXAPRO) 20 MG tablet Take 20 mg by mouth daily.  . hydrOXYzine (ATARAX/VISTARIL) 25 MG tablet Take 25 mg by mouth daily.  [place] Tosylate (INGREZZA) 80 MG CAPS Take 1 capsule by mouth at bedtime.  . mirtazapine (REMERON) 30 MG tablet Take 45 mg by mouth at bedtime. (Patient not taking: Reported on 11/16/2020)  . thiamine 100 MG tablet Take 1 tablet (100 mg total) by mouth daily. (Patient not taking: Reported on 11/16/2020)   No facility-administered encounter medications on file as of 11/16/2020.    Assessment and Plan: Hx of stroke Pt currently takes atorvastatin. He requested a refill. Will need to establish PCP as this was prescribed by a hospital. Pt was also prescribed aspirin and clopidogrel and is not currently taking. Will give pt's information to Inez Catalina so PCP care can be established to pt can get restarted on aspirin.   Depression/Anxiety Pt currently takes aripiprazole and  hydroxyzine. Pt does not report any changes in mood or behavior or any complaints at this time. Continue current regimen.   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 Pt states he is not having any issues with movement and that the medications are working well for him. Continue current regimen.   Access/Adherence Pt uses a pill box to remember to take his medications. He requested refills for his atorvastatin and divalproex - will put orders in. Pt states he threw away his atorvastatin bottle and his  divalproex - pt could not indicate what he takes divalproex for. Need to ensure he is compliant with this medication considering the possible illnesses it may be used to treat. Pt should establish care with a PCP to remain consistent - will follow up with Inez Catalina to see if he can get set up with [facility].  **Followed up with pt again on 11/17/20 to inquire about depakote and ask if he had any history of seizures or changes in mood. Pt denies any history of seizures but does report being on medications for mood changes. Informed him that Inez Catalina will get him set up with [facility] so he can remain on the correct medications.    Sherilyn Banker, PharmD Pharmacy Resident  11/16/2020 4:50 PM

## 2020-11-25 ENCOUNTER — Other Ambulatory Visit: Payer: Self-pay | Admitting: Psychiatry

## 2020-12-07 ENCOUNTER — Other Ambulatory Visit: Payer: Self-pay

## 2020-12-07 ENCOUNTER — Other Ambulatory Visit: Payer: Self-pay | Admitting: Adult Health

## 2020-12-07 ENCOUNTER — Encounter: Payer: Self-pay | Admitting: Adult Health

## 2020-12-07 ENCOUNTER — Ambulatory Visit: Payer: Medicaid Other | Admitting: Adult Health

## 2020-12-07 VITALS — BP 135/86 | HR 92 | Ht 70.0 in | Wt 257.3 lb

## 2020-12-07 DIAGNOSIS — E785 Hyperlipidemia, unspecified: Secondary | ICD-10-CM

## 2020-12-07 DIAGNOSIS — F332 Major depressive disorder, recurrent severe without psychotic features: Secondary | ICD-10-CM

## 2020-12-07 DIAGNOSIS — G8929 Other chronic pain: Secondary | ICD-10-CM

## 2020-12-07 DIAGNOSIS — R030 Elevated blood-pressure reading, without diagnosis of hypertension: Secondary | ICD-10-CM

## 2020-12-07 DIAGNOSIS — I6349 Cerebral infarction due to embolism of other cerebral artery: Secondary | ICD-10-CM

## 2020-12-07 DIAGNOSIS — Z0000 Encounter for general adult medical examination without abnormal findings: Secondary | ICD-10-CM

## 2020-12-07 DIAGNOSIS — M25561 Pain in right knee: Secondary | ICD-10-CM

## 2020-12-07 MED ORDER — THIAMINE HCL 100 MG PO TABS
100.0000 mg | ORAL_TABLET | Freq: Every day | ORAL | 3 refills | Status: DC
Start: 2020-12-07 — End: 2021-03-28

## 2020-12-07 MED ORDER — INGREZZA 80 MG PO CAPS
1.0000 | ORAL_CAPSULE | Freq: Every day | ORAL | 3 refills | Status: DC
Start: 2020-12-07 — End: 2020-12-07

## 2020-12-07 MED ORDER — FOLIC ACID 1 MG PO TABS: 1.0000 mg | ORAL_TABLET | Freq: Every day | ORAL | 3 refills | Status: DC

## 2020-12-07 MED ORDER — MIRTAZAPINE 30 MG PO TABS
45.0000 mg | ORAL_TABLET | Freq: Every day | ORAL | 3 refills | Status: DC
Start: 2020-12-07 — End: 2021-03-28

## 2020-12-07 MED ORDER — ESCITALOPRAM OXALATE 20 MG PO TABS
20.0000 mg | ORAL_TABLET | Freq: Every day | ORAL | 3 refills | Status: DC
Start: 2020-12-07 — End: 2020-12-07

## 2020-12-07 MED ORDER — BENZTROPINE MESYLATE 1 MG PO TABS
1.0000 mg | ORAL_TABLET | Freq: Every day | ORAL | 3 refills | Status: DC
Start: 2020-12-07 — End: 2020-12-07

## 2020-12-07 MED ORDER — HYDROXYZINE HCL 25 MG PO TABS
25.0000 mg | ORAL_TABLET | Freq: Every day | ORAL | 3 refills | Status: DC
Start: 2020-12-07 — End: 2021-03-28

## 2020-12-07 MED ORDER — ATORVASTATIN CALCIUM 40 MG PO TABS
40.0000 mg | ORAL_TABLET | Freq: Every day | ORAL | 3 refills | Status: DC
Start: 2020-12-07 — End: 2020-12-07

## 2020-12-07 MED ORDER — DIVALPROEX SODIUM ER 250 MG PO TB24
750.0000 mg | ORAL_TABLET | Freq: Every day | ORAL | 3 refills | Status: DC
Start: 2020-12-07 — End: 2020-12-07

## 2020-12-07 MED ORDER — ARIPIPRAZOLE 30 MG PO TABS
30.0000 mg | ORAL_TABLET | Freq: Every day | ORAL | 3 refills | Status: DC
Start: 2020-12-07 — End: 2020-12-07

## 2020-12-07 NOTE — Progress Notes (Signed)
Patient ID: Brian Rios, male   DOB: [birth year], 40 y.o.   MRN: [id number]  Chief Complaint  Patient presents with   Follow-up    ER visit for stroke in September    Knee Pain    Experiencing pain in right knee, sharp shooting pain just in knee   Eye Problem    Last optometrist visit was about 5 years ago, near sighted, would like help getting access to glasses as he broke them during his stroke    Hypertension    Pt states it is usually high and would like help managing it    HPI Brian Rios is a 40 y.o. male who presents for an initial office visit and health assessment.  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  He reports some residual right-sided weakness and occasional speech difficulties.  He was discharged on Plavix aspirin and Lipitor.  He has been following up with cardiology as well as neurology.  He has a history of polysubstance abuse and states that he is still drinking. He sees a Licensed conveyancer at [facility].  Reports orthopnea.  His echocardiogram showed a left ventricular ejection fraction between 57-84, normal systolic function left ventricular regional wall motion abnormalities.  He is also complaining of knee pain that has persisted for over a year.  He denies any prior injury.  He is not taking any Motrin or Tylenol for pain.  He denies any recent falls.  He does not check his BP at home and not currently on any medications. He had left eye injury as a child and started using glasses. He has not seen an ophthalmologist in over 5 years. Reports heart burn that occurs mostly at night. Relieved when he vomits.  Reports poor sleep quality.  Denies any prior history of sleep apnea.  He is a current everyday smoker and smokes about 1 pack of cigarettes a day.Lives with his mother. Does not work..   Past Medical History:  Diagnosis Date   Alcohol abuse    Depression    Drug abuse ([place])    Tobacco abuse     Past Surgical History:  Procedure Laterality  Date   ORIF ORBITAL FRACTURE     TEE WITHOUT CARDIOVERSION N/A 07/19/2020   Procedure: TRANSESOPHAGEAL ECHOCARDIOGRAM (TEE);  Surgeon: Corey Skains, MD;  Location: [facility] ORS;  Service: Cardiovascular;  Laterality: N/A;    Family History  Problem Relation Age of Onset   Alcoholism Mother    Alcoholism Father     Social History Social History   Tobacco Use   Smoking status: Current Every Day Smoker    Years: 15.00    Types: Cigarettes   Smokeless tobacco: Current User   Tobacco comment: 1 pack every 3 days  Substance Use Topics   Alcohol use: Yes    Alcohol/week: 7.0 standard drinks    Types: 7 Cans of beer per week   Drug use: Yes    Types: "Crack" cocaine    No Known Allergies  Current Outpatient Medications  Medication Sig Dispense Refill   ARIPiprazole (ABILIFY) 30 MG tablet Take 30 mg by mouth at bedtime.     atorvastatin (LIPITOR) 40 MG tablet Take 1 tablet (40 mg total) by mouth daily. 30 tablet 0   benztropine (COGENTIN) 1 MG tablet Take 1 mg by mouth at bedtime.     divalproex (DEPAKOTE ER) 250 MG 24 hr tablet Take 750 mg by mouth at bedtime.     escitalopram (LEXAPRO) 20  MG tablet Take 20 mg by mouth daily.     hydrOXYzine (ATARAX/VISTARIL) 25 MG tablet Take 25 mg by mouth daily.     mirtazapine (REMERON) 30 MG tablet Take 45 mg by mouth at bedtime. (Patient not taking: Reported on 11/16/2020)     thiamine 100 MG tablet Take 1 tablet (100 mg total) by mouth daily. (Patient not taking: Reported on 11/16/2020) 30 tablet 0   Valbenazine Tosylate (INGREZZA) 80 MG CAPS Take 1 capsule by mouth at bedtime.     No current facility-administered medications for this visit.    Review of Systems Review of Systems  Constitutional:  Positive for fatigue. Negative for appetite change.  Eyes:  Positive for visual disturbance.  Gastrointestinal:  Positive for abdominal pain (epigastric pain). Negative for constipation, diarrhea, nausea and vomiting.  Endocrine: Negative for  polydipsia, polyphagia and polyuria.  Genitourinary: Negative.   Musculoskeletal:  Positive for gait problem and myalgias (right knee).  Skin: Negative.  Negative for rash.  Allergic/Immunologic: Negative.   Neurological:  Positive for dizziness and headaches. Negative for weakness.  Hematological: Negative.   Psychiatric/Behavioral: Negative.     Blood pressure 135/86, pulse 92, height 5' 10" (1.778 m), weight 257 lb 4.8 oz (116.7 kg), SpO2 96 %.  Physical Exam Physical Exam Vitals and nursing note reviewed.  Constitutional:      Appearance: Normal appearance.  HENT:     Head: Normocephalic and atraumatic.     Mouth/Throat:     Mouth: Mucous membranes are moist.  Eyes:     Extraocular Movements: Extraocular movements intact.     Conjunctiva/sclera: Conjunctivae normal.     Pupils: Pupils are equal, round, and reactive to light.  Cardiovascular:     Rate and Rhythm: Normal rate and regular rhythm.     Pulses: Normal pulses.     Heart sounds: Normal heart sounds.  Pulmonary:     Effort: Pulmonary effort is normal.     Breath sounds: Normal breath sounds.  Abdominal:     General: Abdomen is flat. Bowel sounds are normal.     Palpations: Abdomen is soft.  Genitourinary:    Comments: DEFERRED per patient's preference Musculoskeletal:        General: Normal range of motion.     Cervical back: Normal range of motion and neck supple.  Skin:    General: Skin is warm and dry.  Neurological:     Mental Status: He is alert and oriented to person, place, and time.     Motor: Weakness (right UE/LE) present.  Psychiatric:        Mood and Affect: Mood normal.        Behavior: Behavior normal.    Data Reviewed Labs from prior hospitalization reviewed  Assessment and plan 1. Health maintenance examination Except for mild residual right-sided weakness, his physical exam is overall unremarkable.  Will obtain routine screening labs.  Patient advised to follow-up with cardiologist,  heart healthy diet principles reviewed and daily exercise recommended. - Urinalysis, Routine w reflex microscopic - TSH; Future - Lipid Profile; Future - HgB A1c; Future - Comp Met (CMET); Future - CBC w/Diff; Future  2. Elevated BP without diagnosis of hypertension This is probably the cause of his headaches and occasional dizziness.  Patient advised to monitor and keep a log of his blood pressure readings.  Call the clinic if systolic blood pressures consistently greater than 150.  If after controlling his blood pressure is still experiencing headaches and dizziness, we will   refer him to the neurologist for further evaluation.  3. Hyperlipidemia, unspecified hyperlipidemia type Patient is already on a statin.  Continue current dose.  We will obtain repeat lipid panel  4. Severe episode of recurrent major depressive disorder, without psychotic features (HCC) His mood is pleasant today.  Referral to the clinic counselor initiated.  Patient advised to continue current dose of Vistaril and antidepressant.  5. 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  Smoking cessation advice given.  Patient declines referral to smoking cessation program.  Will reapproach during next visit  6. Chronic pain of right knee Patient advised to take as needed Tylenol, apply ice as needed.  Will order x-ray and refer to Ortho. - DG Knee Complete 4 Views Right; Future   7.  Left eye injury.  Will refer to ophthalmology for reevaluation.    Magddalene S Tukov-Yual 12/07/2020, 1:05 PM   

## 2020-12-08 ENCOUNTER — Telehealth: Payer: Self-pay | Admitting: Pharmacist

## 2020-12-08 NOTE — Telephone Encounter (Signed)
12/08/2020 9:26:24 AM - Brian Rios renewal to patient & dr  -- Brian Rios - Thursday, December 08, 2020 9:22 AM --Received pharmacy printout for Brian Rios 80mg  Take one capsule by mouth daily at bedtime  provider: Dorene Sorrow.  I printed [employer] application- patient has medications here to pick up, put patient portion of application in with meds for patient to sign and form to be returned to me, also requesting patient's income/support, taxes/[phone]--this is needed for renewal of medication. I have application marked for medications to ship to our office-also this company only ships a 30 day supply . Patient was previously on this medication with provider Dr. Jamse Arn @ [facility]--patient had been receiving at home.

## 2021-01-17 ENCOUNTER — Other Ambulatory Visit: Payer: Self-pay | Admitting: Psychiatry

## 2021-01-25 ENCOUNTER — Other Ambulatory Visit: Payer: Self-pay

## 2021-02-02 ENCOUNTER — Other Ambulatory Visit: Payer: Self-pay

## 2021-02-02 MED ORDER — ESCITALOPRAM OXALATE 20 MG PO TABS
ORAL_TABLET | Freq: Every day | ORAL | 1 refills | Status: DC
  Filled 2021-02-02: qty 30, 30d supply, fill #0
  Filled 2021-03-16: qty 14, 14d supply, fill #1

## 2021-02-02 MED ORDER — MIRTAZAPINE 45 MG PO TABS
ORAL_TABLET | Freq: Every day | ORAL | 1 refills | Status: DC
  Filled 2021-02-02: qty 30, 30d supply, fill #0
  Filled 2021-03-16: qty 14, 14d supply, fill #1

## 2021-02-02 MED ORDER — ARIPIPRAZOLE 30 MG PO TABS
ORAL_TABLET | Freq: Every day | ORAL | 1 refills | Status: DC
Start: 2021-02-02 — End: 2021-03-28
  Filled 2021-02-02: qty 30, 30d supply, fill #0
  Filled 2021-03-16: qty 14, 14d supply, fill #1

## 2021-02-02 MED FILL — Thiamine Mononitrate Tab 100 MG: ORAL | 30 days supply | Qty: 30 | Fill #0 | Status: AC

## 2021-02-02 MED FILL — Folic Acid Tab 1 MG: ORAL | 30 days supply | Qty: 30 | Fill #0 | Status: AC

## 2021-02-02 MED FILL — Atorvastatin Calcium Tab 40 MG (Base Equivalent): ORAL | 90 days supply | Qty: 90 | Fill #0 | Status: AC

## 2021-02-02 MED FILL — Divalproex Sodium Tab ER 24 HR 250 MG: ORAL | 30 days supply | Qty: 90 | Fill #0 | Status: AC

## 2021-02-03 ENCOUNTER — Other Ambulatory Visit: Payer: Self-pay

## 2021-02-07 ENCOUNTER — Other Ambulatory Visit: Payer: Self-pay

## 2021-03-08 ENCOUNTER — Encounter: Payer: Self-pay | Admitting: Adult Health

## 2021-03-08 ENCOUNTER — Other Ambulatory Visit: Payer: Self-pay

## 2021-03-08 ENCOUNTER — Ambulatory Visit: Payer: Medicaid Other | Admitting: Adult Health

## 2021-03-16 ENCOUNTER — Telehealth: Payer: Self-pay | Admitting: Emergency Medicine

## 2021-03-16 ENCOUNTER — Other Ambulatory Visit: Payer: Self-pay

## 2021-03-16 MED FILL — Folic Acid Tab 1 MG: ORAL | 14 days supply | Qty: 14 | Fill #1 | Status: AC

## 2021-03-16 MED FILL — Thiamine Mononitrate Tab 100 MG: ORAL | 14 days supply | Qty: 14 | Fill #1 | Status: AC

## 2021-03-16 NOTE — Telephone Encounter (Signed)
Patient called in today advising the dose of Abilify Maintena 400mg  IM he is getting monthly. Medication was added to medication list.

## 2021-03-17 ENCOUNTER — Other Ambulatory Visit: Payer: Self-pay

## 2021-03-23 ENCOUNTER — Other Ambulatory Visit: Payer: Self-pay

## 2021-03-28 ENCOUNTER — Other Ambulatory Visit: Payer: Self-pay

## 2021-03-28 ENCOUNTER — Ambulatory Visit: Payer: Medicaid Other | Admitting: Gerontology

## 2021-03-28 ENCOUNTER — Encounter: Payer: Self-pay | Admitting: Gerontology

## 2021-03-28 VITALS — BP 122/84 | HR 75 | Temp 97.5°F | Resp 16 | Ht 70.0 in | Wt 263.5 lb

## 2021-03-28 DIAGNOSIS — Z0000 Encounter for general adult medical examination without abnormal findings: Secondary | ICD-10-CM

## 2021-03-28 DIAGNOSIS — Z09 Encounter for follow-up examination after completed treatment for conditions other than malignant neoplasm: Secondary | ICD-10-CM

## 2021-03-28 DIAGNOSIS — F17200 Nicotine dependence, unspecified, uncomplicated: Secondary | ICD-10-CM

## 2021-03-28 DIAGNOSIS — Z8639 Personal history of other endocrine, nutritional and metabolic disease: Secondary | ICD-10-CM | POA: Insufficient documentation

## 2021-03-28 DIAGNOSIS — E785 Hyperlipidemia, unspecified: Secondary | ICD-10-CM | POA: Insufficient documentation

## 2021-03-28 MED ORDER — ATORVASTATIN CALCIUM 40 MG PO TABS
ORAL_TABLET | Freq: Every day | ORAL | 3 refills | Status: DC
  Filled 2021-03-28: qty 30, fill #0
  Filled 2021-05-20: qty 30, 30d supply, fill #0
  Filled 2021-07-03: qty 30, 30d supply, fill #1
  Filled 2021-08-05: qty 30, 30d supply, fill #2
  Filled 2021-09-17: qty 30, 30d supply, fill #3

## 2021-03-28 NOTE — Patient Instructions (Signed)
Heart-Healthy Eating Plan Heart-healthy meal planning includes:  Eating less unhealthy fats.  Eating more healthy fats.  Making other changes in your diet. Talk with your doctor or a diet specialist (dietitian) to create an eating plan that is right for you. What is my plan? Your doctor may recommend an eating plan that includes:  Total fat: ______% or less of total calories a day.  Saturated fat: ______% or less of total calories a day.  Cholesterol: less than _________mg a day. What are tips for following this plan? Cooking Avoid frying your food. Try to bake, boil, grill, or broil it instead. You can also reduce fat by:  Removing the skin from poultry.  Removing all visible fats from meats.  Steaming vegetables in water or broth. Meal planning  At meals, divide your plate into four equal parts: ? Fill one-half of your plate with vegetables and green salads. ? Fill one-fourth of your plate with whole grains. ? Fill one-fourth of your plate with lean protein foods.  Eat 4-5 servings of vegetables per day. A serving of vegetables is: ? 1 cup of raw or cooked vegetables. ? 2 cups of raw leafy greens.  Eat 4-5 servings of fruit per day. A serving of fruit is: ? 1 medium whole fruit. ?  cup of dried fruit. ?  cup of fresh, frozen, or canned fruit. ?  cup of 100% fruit juice.  Eat more foods that have soluble fiber. These are apples, broccoli, carrots, beans, peas, and barley. Try to get 20-30 g of fiber per day.  Eat 4-5 servings of nuts, legumes, and seeds per week: ? 1 serving of dried beans or legumes equals  cup after being cooked. ? 1 serving of nuts is  cup. ? 1 serving of seeds equals 1 tablespoon.   General information  Eat more home-cooked food. Eat less restaurant, buffet, and fast food.  Limit or avoid alcohol.  Limit foods that are high in starch and sugar.  Avoid fried foods.  Lose weight if you are overweight.  Keep track of how much salt  (sodium) you eat. This is important if you have high blood pressure. Ask your doctor to tell you more about this.  Try to add vegetarian meals each week. Fats  Choose healthy fats. These include olive oil and canola oil, flaxseeds, walnuts, almonds, and seeds.  Eat more omega-3 fats. These include salmon, mackerel, sardines, tuna, flaxseed oil, and ground flaxseeds. Try to eat fish at least 2 times each week.  Check food labels. Avoid foods with trans fats or high amounts of saturated fat.  Limit saturated fats. ? These are often found in animal products, such as meats, butter, and cream. ? These are also found in plant foods, such as palm oil, palm kernel oil, and coconut oil.  Avoid foods with partially hydrogenated oils in them. These have trans fats. Examples are stick margarine, some tub margarines, cookies, crackers, and other baked goods. What foods can I eat? Fruits All fresh, canned (in natural juice), or frozen fruits. Vegetables Fresh or frozen vegetables (raw, steamed, roasted, or grilled). Green salads. Grains Most grains. Choose whole wheat and whole grains most of the time. Rice and pasta, including brown rice and pastas made with whole wheat. Meats and other proteins Lean, well-trimmed beef, veal, pork, and lamb. Chicken and turkey without skin. All fish and shellfish. Wild duck, rabbit, pheasant, and venison. Egg whites or low-cholesterol egg substitutes. Dried beans, peas, lentils, and tofu. Seeds and   most nuts. Dairy Low-fat or nonfat cheeses, including ricotta and mozzarella. Skim or 1% milk that is liquid, powdered, or evaporated. Buttermilk that is made with low-fat milk. Nonfat or low-fat yogurt. Fats and oils Non-hydrogenated (trans-free) margarines. Vegetable oils, including soybean, sesame, sunflower, olive, peanut, safflower, corn, canola, and cottonseed. Salad dressings or mayonnaise made with a vegetable oil. Beverages Mineral water. Coffee and tea. Diet  carbonated beverages. Sweets and desserts Sherbet, gelatin, and fruit ice. Small amounts of dark chocolate. Limit all sweets and desserts. Seasonings and condiments All seasonings and condiments. The items listed above may not be a complete list of foods and drinks you can eat. Contact a dietitian for more options. What foods should I avoid? Fruits Canned fruit in heavy syrup. Fruit in cream or butter sauce. Fried fruit. Limit coconut. Vegetables Vegetables cooked in cheese, cream, or butter sauce. Fried vegetables. Grains Breads that are made with saturated or trans fats, oils, or whole milk. Croissants. Sweet rolls. Donuts. High-fat crackers, such as cheese crackers. Meats and other proteins Fatty meats, such as hot dogs, ribs, sausage, bacon, rib-eye roast or steak. High-fat deli meats, such as salami and bologna. Caviar. Domestic duck and goose. Organ meats, such as liver. Dairy Cream, sour cream, cream cheese, and creamed cottage cheese. Whole-milk cheeses. Whole or 2% milk that is liquid, evaporated, or condensed. Whole buttermilk. Cream sauce or high-fat cheese sauce. Yogurt that is made from whole milk. Fats and oils Meat fat, or shortening. Cocoa butter, hydrogenated oils, palm oil, coconut oil, palm kernel oil. Solid fats and shortenings, including bacon fat, salt pork, lard, and butter. Nondairy cream substitutes. Salad dressings with cheese or sour cream. Beverages Regular sodas and juice drinks with added sugar. Sweets and desserts Frosting. Pudding. Cookies. Cakes. Pies. Milk chocolate or white chocolate. Buttered syrups. Full-fat ice cream or ice cream drinks. The items listed above may not be a complete list of foods and drinks to avoid. Contact a dietitian for more information. Summary  Heart-healthy meal planning includes eating less unhealthy fats, eating more healthy fats, and making other changes in your diet.  Eat a balanced diet. This includes fruits and  vegetables, low-fat or nonfat dairy, lean protein, nuts and legumes, whole grains, and heart-healthy oils and fats. This information is not intended to replace advice given to you by your health care provider. Make sure you discuss any questions you have with your health care provider. Document Revised: 12/12/2017 Document Reviewed: 11/15/2017 Elsevier Patient Education  2021 [employer]. Smoking Tobacco Information, Adult Smoking tobacco can be harmful to your health. Tobacco contains a poisonous (toxic), colorless chemical called nicotine. Nicotine is addictive. It changes the brain and can make it hard to stop smoking. Tobacco also has other toxic chemicals that can hurt your body and raise your risk of many cancers. How can smoking tobacco affect me? Smoking tobacco puts you at risk for:  Cancer. Smoking is most commonly associated with lung cancer, but can also lead to cancer in other parts of the body.  Chronic obstructive pulmonary disease (COPD). This is a long-term lung condition that makes it hard to breathe. It also gets worse over time.  High blood pressure (hypertension), heart disease, stroke, or heart attack.  Lung infections, such as pneumonia.  Cataracts. This is when the lenses in the eyes become clouded.  Digestive problems. This may include peptic ulcers, heartburn, and gastroesophageal reflux disease (GERD).  Oral health problems, such as gum disease and tooth loss.  Loss of taste  and smell. Smoking can affect your appearance by causing:  Wrinkles.  Yellow or stained teeth, fingers, and fingernails. Smoking tobacco can also affect your social life, because:  It may be challenging to find places to smoke when away from home. Many workplaces, [employer], hotels, and public places are tobacco-free.  Smoking is expensive. This is due to the cost of tobacco and the long-term costs of treating health problems from smoking.  Secondhand smoke may affect those around  you. Secondhand smoke can cause lung cancer, breathing problems, and heart disease. Children of smokers have a higher risk for: ? Sudden infant death syndrome (SIDS). ? Ear infections. ? Lung infections. If you currently smoke tobacco, quitting now can help you:  Lead a longer and healthier life.  Look, smell, breathe, and feel better over time.  Save money.  Protect others from the harms of secondhand smoke. What actions can I take to prevent health problems? Quit smoking  Do not start smoking. Quit if you already do.  Make a plan to quit smoking and commit to it. Look for programs to help you and ask your health care provider for recommendations and ideas.  Set a date and write down all the reasons you want to quit.  Let your friends and family know you are quitting so they can help and support you. Consider finding friends who also want to quit. It can be easier to quit with someone else, so that you can support each other.  Talk with your health care provider about using nicotine replacement medicines to help you quit, such as gum, lozenges, patches, sprays, or pills.  Do not replace cigarette smoking with electronic cigarettes, which are commonly called e-cigarettes. The safety of e-cigarettes is not known, and some may contain harmful chemicals.  If you try to quit but return to smoking, stay positive. It is common to slip up when you first quit, so take it one day at a time.  Be prepared for cravings. When you feel the urge to smoke, chew gum or suck on hard candy.   Lifestyle  Stay busy and take care of your body.  Drink enough fluid to keep your urine pale yellow.  Get plenty of exercise and eat a healthy diet. This can help prevent weight gain after quitting.  Monitor your eating habits. Quitting smoking can cause you to have a larger appetite than when you smoke.  Find ways to relax. Go out with friends or family to a movie or a restaurant where people do not  smoke.  Ask your health care provider about having regular tests (screenings) to check for cancer. This may include blood tests, imaging tests, and other tests.  Find ways to manage your stress, such as meditation, yoga, or exercise. Where to find support To get support to quit smoking, consider:  Asking your health care provider for more information and resources.  Taking classes to learn more about quitting smoking.  Looking for local organizations that offer resources about quitting smoking.  Joining a support group for people who want to quit smoking in your local community.  Calling the smokefree.gov counselor helpline: 1-800-Quit-Now 873-792-7956) Where to find more information You may find more information about quitting smoking from:  HelpGuide.org: www.helpguide.org  https://hall.com/: smokefree.gov  American Lung Association: www.lung.org Contact a health care provider if you:  Have problems breathing.  Notice that your lips, nose, or fingers turn blue.  Have chest pain.  Are coughing up blood.  Feel faint or you  pass out.  Have other health changes that cause you to worry. Summary  Smoking tobacco can negatively affect your health, the health of those around you, your finances, and your social life.  Do not start smoking. Quit if you already do. If you need help quitting, ask your health care provider.  Think about joining a support group for people who want to quit smoking in your local community. There are many effective programs that will help you to quit this behavior. This information is not intended to replace advice given to you by your health care provider. Make sure you discuss any questions you have with your health care provider. Document Revised: 07/03/2019 Document Reviewed: 10/23/2016 Elsevier Patient Education  2021 [employer].

## 2021-03-28 NOTE — Progress Notes (Signed)
Established Patient Office Visit  Subjective:  Patient ID: Brian Rios, male    DOB: 10/03/1981  Age: 40 y.o. MRN: [id number]  CC:  Chief Complaint  Patient presents with  . Follow-up    Rescheduled from 03/08/21 OV    HPI Brian Rios  Is a 40 y/o male who has history of Alcohol abuse, Depression, Drug abuse, CVA, presents for routine follow up visit. 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 He reports that he's compliant with his medications and denies side effects. He continues to smoke 1 pack of cigarette in 2 days and admits te desire to quit. He goes to [facility] for Mental health care, states that he's mood is good, denies suicidal nor homicidal ideation. Overall, he states that he's doing well and offers no further complaint.  Past Medical History:  Diagnosis Date  . Alcohol abuse   . Depression   . Drug abuse (HCC)   . Tobacco abuse     Past Surgical History:  Procedure Laterality Date  . ORIF ORBITAL FRACTURE    . TEE WITHOUT CARDIOVERSION N/A 07/19/2020   Procedure: TRANSESOPHAGEAL ECHOCARDIOGRAM (TEE);  Surgeon: Lamar Blinks, MD;  Location: [facility] ORS;  Service: Cardiovascular;  Laterality: N/A;    Family History  Problem Relation Age of Onset  . Alcoholism Mother   . Hypertension Mother   . Hyperlipidemia Mother   . Breast cancer Mother   . Alcoholism Father   . Diabetes Maternal Grandmother     Social History   Socioeconomic History  . Marital status: Single    Spouse name: Not on file  . Number of children: Not on file  . Years of education: Not on file  . Highest education level: Not on file  Occupational History  . Not on file  Tobacco Use  . Smoking status: Current Every Day Smoker    Packs/day: 0.50    Years: 15.00    Pack years: 7.50    Types: Cigarettes  . Smokeless tobacco: Former Neurosurgeon    Quit date: 03/09/2003  . Tobacco comment: 1 pack every 3 days  Vaping Use  .  Vaping Use: Never used  Substance and Sexual Activity  . Alcohol use: Yes    Alcohol/week: 7.0 standard drinks    Types: 7 Cans of beer per week  . Drug use: Not Currently    Types: "Crack" cocaine    Comment: last use 2016  . Sexual activity: Not on file  Other Topics Concern  . Not on file  Social History Narrative  . Not on file   Social Determinants of Health   Financial Resource Strain: Not on file  Food Insecurity: No Food Insecurity  . Worried About Programme researcher, broadcasting/film/video in the Last Year: Never true  . Ran Out of Food in the Last Year: Never true  Transportation Needs: No Transportation Needs  . Lack of Transportation (Medical): No  . Lack of Transportation (Non-Medical): No  Physical Activity: Not on file  Stress: Not on file  Social Connections: Not on file  Intimate Partner Violence: Not on file    Outpatient Medications Prior to Visit  Medication Sig Dispense Refill  . ARIPiprazole ER (ABILIFY MAINTENA) 400 MG PRSY prefilled syringe Inject 400 mg into the muscle every 30 (thirty) days.    . benztropine (COGENTIN) 1 MG tablet TAKE ONE TABLET BY MOUTH AT BEDTIME 30 tablet 2  . divalproex (DEPAKOTE ER) 250  MG 24 hr tablet TAKE 3 TABLETS BY MOUTH AT BEDTIME 90 tablet 2  . escitalopram (LEXAPRO) 20 MG tablet TAKE ONE TABLET BY MOUTH EVERY DAY 30 tablet 2  . folic acid (FOLVITE) 1 MG tablet TAKE ONE TABLET BY MOUTH EVERY DAY 30 tablet 3  . hydrOXYzine (ATARAX/VISTARIL) 25 MG tablet TAKE 1 TO 2 TABLETS BY MOUTH AT BEDTIME AS NEEDED 60 tablet 2  . mirtazapine (REMERON) 45 MG tablet TAKE ONE TABLET BY MOUTH AT BEDTIME 30 tablet 2  . thiamine (VITAMIN B-1) 100 MG tablet TAKE ONE TABLET BY MOUTH EVERY DAY 30 tablet 3  . valbenazine (INGREZZA) 80 MG capsule TAKE ONE CAPSULE BY MOUTH AT BEDTIME 30 capsule 3  . ARIPiprazole (ABILIFY) 30 MG tablet TAKE ONE TABLET BY MOUTH AT BEDTIME 30 tablet 1  . atorvastatin (LIPITOR) 40 MG tablet TAKE ONE TABLET BY MOUTH EVERY DAY 30 tablet 3   . ARIPiprazole (ABILIFY) 30 MG tablet TAKE ONE TABLET BY MOUTH AT BEDTIME 30 tablet 2  . ARIPiprazole (ABILIFY) 30 MG tablet TAKE ONE TABLET BY MOUTH AT BEDTIME 30 tablet 3  . ARIPiprazole (ABILIFY) 30 MG tablet TAKE ONE TABLET BY MOUTH AT BEDTIME 30 tablet 1  . ARIPiprazole (ABILIFY) 30 MG tablet TAKE ONE TABLET BY MOUTH AT BEDTIME 30 tablet 1  . benztropine (COGENTIN) 1 MG tablet TAKE ONE TABLET BY MOUTH AT BEDTIME 30 tablet 3  . benztropine (COGENTIN) 1 MG tablet TAKE ONE TABLET BY MOUTH AT BEDTIME 30 tablet 1  . benztropine (COGENTIN) 1 MG tablet TAKE ONE TABLET BY MOUTH AT BEDTIME 30 tablet 1  . benztropine (COGENTIN) 1 MG tablet TAKE ONE TABLET BY MOUTH AT BEDTIME 30 tablet 1  . divalproex (DEPAKOTE ER) 250 MG 24 hr tablet TAKE 3 TABLETS BY MOUTH AT BEDTIME 90 tablet 3  . divalproex (DEPAKOTE ER) 250 MG 24 hr tablet TAKE 3 TABLETS BY MOUTH AT BEDTIME 90 tablet 1  . divalproex (DEPAKOTE ER) 250 MG 24 hr tablet TAKE 3 TABLETS BY MOUTH AT BEDTIME 90 tablet 1  . divalproex (DEPAKOTE ER) 250 MG 24 hr tablet TAKE 3 TABLETS BY MOUTH AT BEDTIME 90 tablet 1  . escitalopram (LEXAPRO) 20 MG tablet TAKE ONE TABLET BY MOUTH EVERY DAY 30 tablet 3  . escitalopram (LEXAPRO) 20 MG tablet TAKE ONE TABLET BY MOUTH EVERY DAY 30 tablet 1  . escitalopram (LEXAPRO) 20 MG tablet TAKE ONE TABLET BY MOUTH EVERY DAY 30 tablet 1  . hydrochlorothiazide (HYDRODIURIL) 25 MG tablet TAKE ONE TABLET BY MOUTH EVERY DAY 30 tablet 3  . hydrOXYzine (ATARAX/VISTARIL) 25 MG tablet Take 1 tablet (25 mg total) by mouth daily. 30 tablet 3  . mirtazapine (REMERON) 30 MG tablet Take 1.5 tablets (45 mg total) by mouth at bedtime. 30 tablet 3  . mirtazapine (REMERON) 45 MG tablet TAKE ONE TABLET BY MOUTH AT BEDTIME 30 tablet 1  . mirtazapine (REMERON) 45 MG tablet TAKE ONE TABLET BY MOUTH AT BEDTIME 30 tablet 1  . thiamine 100 MG tablet Take 1 tablet (100 mg total) by mouth daily. 30 tablet 3   No facility-administered medications  prior to visit.    No Known Allergies  ROS Review of Systems  Constitutional: Negative.   Eyes: Negative.   Respiratory: Negative.   Cardiovascular: Negative.   Musculoskeletal: Negative.   Skin: Negative.   Neurological: Negative.  Negative for weakness.  Psychiatric/Behavioral: Negative.       Objective:    Physical Exam HENT:  Head: Normocephalic.  Eyes:     General: No visual field deficit.    Extraocular Movements: Extraocular movements intact.     Pupils: Pupils are equal, round, and reactive to light.  Cardiovascular:     Rate and Rhythm: Normal rate and regular rhythm.     Pulses: Normal pulses.     Heart sounds: Normal heart sounds.  Pulmonary:     Effort: Pulmonary effort is normal.     Breath sounds: Normal breath sounds.  Musculoskeletal:        General: Normal range of motion.  Skin:    General: Skin is warm.  Neurological:     Mental Status: He is alert and oriented to person, place, and time.     GCS: GCS eye subscore is 4. GCS verbal subscore is 5. GCS motor subscore is 6.     Cranial Nerves: Cranial nerves are intact. No cranial nerve deficit, dysarthria or facial asymmetry.     Sensory: Sensation is intact.     Motor: Weakness (mild to right arm) present.     Coordination: Coordination is intact.     Gait: Gait is intact.  Psychiatric:        Mood and Affect: Mood normal.        Behavior: Behavior normal.        Thought Content: Thought content normal.        Judgment: Judgment normal.     BP 122/84 (BP Location: Right Arm, Patient Position: Sitting, Cuff Size: Large)   Pulse 75   Temp (!) 97.5 F (36.4 C)   Resp 16   Ht $R'5\' 10"'VM$  (1.778 m)   Wt 263 lb 8 oz (119.5 kg)   SpO2 96%   BMI 37.81 kg/m  Wt Readings from Last 3 Encounters:  03/28/21 263 lb 8 oz (119.5 kg)  03/08/21 259 lb (117.5 kg)  12/07/20 257 lb 4.8 oz (116.7 kg)   Encouraged weight loss  Health Maintenance Due  Topic Date Due  . COVID-19 Vaccine (1) Never done   . Pneumococcal Vaccine 51-60 Years old (1 of 4 - PCV13) Never done  . Hepatitis C Screening  Never done    There are no preventive care reminders to display for this patient.  Lab Results  Component Value Date   TSH 2.763 02/16/2015   Lab Results  Component Value Date   WBC 9.3 07/17/2020   HGB 13.9 07/17/2020   HCT 42.5 07/17/2020   MCV 85.0 07/17/2020   PLT 260 07/17/2020   Lab Results  Component Value Date   NA 138 07/17/2020   K 4.1 07/17/2020   CO2 23 07/17/2020   GLUCOSE 102 (H) 07/17/2020   BUN 13 07/17/2020   CREATININE 0.92 07/17/2020   BILITOT 0.3 02/14/2015   ALKPHOS 62 02/14/2015   AST 25 02/14/2015   ALT 32 02/14/2015   PROT 8.3 (H) 02/14/2015   ALBUMIN 4.7 02/14/2015   CALCIUM 9.3 07/17/2020   ANIONGAP 10 07/17/2020   Lab Results  Component Value Date   CHOL 278 (H) 07/18/2020   Lab Results  Component Value Date   HDL 51 07/18/2020   Lab Results  Component Value Date   LDLCALC 200 (H) 07/18/2020   Lab Results  Component Value Date   TRIG 133 07/18/2020   Lab Results  Component Value Date   CHOLHDL 5.5 07/18/2020   Lab Results  Component Value Date   HGBA1C 6.0 (H) 07/18/2020      Assessment &  Plan:    1. Tobacco use disorder -He was encouraged on smoking cessation, provided with [place] Quit line information  2. H/O elevated lipids - He will continue on current medication, low fat/cholesterol diet and exercise as tolerated. Will recheck Lipitor. - atorvastatin (LIPITOR) 40 MG tablet; TAKE ONE TABLET BY MOUTH EVERY DAY  Dispense: 30 tablet; Refill: 3   3. Health maintenance examination -Routine labs will be checked. - CBC w/Diff - Comp Met (CMET) - HgB A1c - Lipid Profile - TSH    Follow-up: Return in about 3 months (around 06/28/2021), or if symptoms worsen or fail to improve.    Analese Sovine Jerold Coombe, NP

## 2021-03-29 ENCOUNTER — Other Ambulatory Visit: Payer: Self-pay | Admitting: Gerontology

## 2021-03-29 ENCOUNTER — Other Ambulatory Visit: Payer: Self-pay

## 2021-03-29 DIAGNOSIS — R7303 Prediabetes: Secondary | ICD-10-CM

## 2021-03-29 LAB — COMPREHENSIVE METABOLIC PANEL
ALT: 13 IU/L (ref 0–44)
AST: 14 IU/L (ref 0–40)
Albumin/Globulin Ratio: 1.8 (ref 1.2–2.2)
Albumin: 3.6 g/dL — ABNORMAL LOW (ref 4.0–5.0)
Alkaline Phosphatase: 58 IU/L (ref 44–121)
BUN/Creatinine Ratio: 6 — ABNORMAL LOW (ref 9–20)
BUN: 6 mg/dL (ref 6–24)
Bilirubin Total: <0.2 mg/dL (ref 0.0–1.2)
CO2: 21 mmol/L (ref 20–29)
Calcium: 8.8 mg/dL (ref 8.7–10.2)
Chloride: 108 mmol/L — ABNORMAL HIGH (ref 96–106)
Creatinine, Ser: 0.96 mg/dL (ref 0.76–1.27)
Globulin, Total: 2 g/dL (ref 1.5–4.5)
Glucose: 85 mg/dL (ref 65–99)
Potassium: 4.4 mmol/L (ref 3.5–5.2)
Sodium: 141 mmol/L (ref 134–144)
Total Protein: 5.6 g/dL — ABNORMAL LOW (ref 6.0–8.5)
eGFR: 102 mL/min/{1.73_m2} (ref >59)

## 2021-03-29 LAB — LIPID PANEL
Chol/HDL Ratio: 3 ratio (ref 0.0–5.0)
Cholesterol, Total: 144 mg/dL (ref 100–199)
HDL: 48 mg/dL (ref >39)
LDL Chol Calc (NIH): 80 mg/dL (ref 0–99)
Triglycerides: 83 mg/dL (ref 0–149)
VLDL Cholesterol Cal: 16 mg/dL (ref 5–40)

## 2021-03-29 LAB — CBC WITH DIFFERENTIAL/PLATELET
Basophils Absolute: 0.1 10*3/uL (ref 0.0–0.2)
Basos: 1 %
EOS (ABSOLUTE): 0.2 10*3/uL (ref 0.0–0.4)
Eos: 2 %
Hematocrit: 45 % (ref 37.5–51.0)
Hemoglobin: 15 g/dL (ref 13.0–17.7)
Immature Grans (Abs): 0.1 10*3/uL (ref 0.0–0.1)
Immature Granulocytes: 1 %
Lymphocytes Absolute: 2.4 10*3/uL (ref 0.7–3.1)
Lymphs: 22 %
MCH: 27.4 pg (ref 26.6–33.0)
MCHC: 33.3 g/dL (ref 31.5–35.7)
MCV: 82 fL (ref 79–97)
Monocytes Absolute: 0.8 10*3/uL (ref 0.1–0.9)
Monocytes: 7 %
Neutrophils Absolute: 7.5 10*3/uL — ABNORMAL HIGH (ref 1.4–7.0)
Neutrophils: 67 %
Platelets: 246 10*3/uL (ref 150–450)
RBC: 5.48 x10E6/uL (ref 4.14–5.80)
RDW: 13.4 % (ref 11.6–15.4)
WBC: 10.9 10*3/uL — ABNORMAL HIGH (ref 3.4–10.8)

## 2021-03-29 LAB — HEMOGLOBIN A1C
Est. average glucose Bld gHb Est-mCnc: 126 mg/dL
Hgb A1c MFr Bld: 6 % — ABNORMAL HIGH (ref 4.8–5.6)

## 2021-03-29 LAB — TSH: TSH: 4.04 u[IU]/mL (ref 0.450–4.500)

## 2021-03-29 MED ORDER — METFORMIN HCL 1000 MG PO TABS
500.0000 mg | ORAL_TABLET | Freq: Every day | ORAL | 0 refills | Status: DC
  Filled 2021-03-29: qty 15, 30d supply, fill #0

## 2021-03-29 NOTE — Progress Notes (Signed)
Brian Rios was notified that his HgbA1c was 6%, started him on Metformin 500 mg daily, was educated on medication side effects and he verbalized understanding.

## 2021-04-03 ENCOUNTER — Other Ambulatory Visit (HOSPITAL_COMMUNITY): Payer: Self-pay

## 2021-04-04 ENCOUNTER — Other Ambulatory Visit: Payer: Self-pay

## 2021-04-04 MED ORDER — ARIPIPRAZOLE 30 MG PO TABS
ORAL_TABLET | ORAL | 2 refills | Status: DC
  Filled 2021-04-04: qty 30, 30d supply, fill #0
  Filled 2021-05-20: qty 30, 30d supply, fill #1
  Filled 2021-07-03: qty 30, 30d supply, fill #2

## 2021-04-04 MED ORDER — DIVALPROEX SODIUM ER 250 MG PO TB24
ORAL_TABLET | ORAL | 2 refills | Status: DC
  Filled 2021-04-04: qty 90, 30d supply, fill #0
  Filled 2021-05-20: qty 90, 30d supply, fill #1
  Filled 2021-07-03: qty 90, 30d supply, fill #2

## 2021-04-04 MED ORDER — HYDROXYZINE HCL 25 MG PO TABS
ORAL_TABLET | ORAL | 2 refills | Status: DC
  Filled 2021-04-04 (×2): qty 90, 30d supply, fill #0
  Filled 2021-05-20: qty 90, 30d supply, fill #1
  Filled 2021-07-03: qty 90, 30d supply, fill #2

## 2021-04-04 MED ORDER — BENZTROPINE MESYLATE 1 MG PO TABS
ORAL_TABLET | ORAL | 2 refills | Status: DC
  Filled 2021-04-04: qty 30, 30d supply, fill #0
  Filled 2021-05-20: qty 30, 30d supply, fill #1
  Filled 2021-07-03: qty 30, 30d supply, fill #2

## 2021-04-04 MED ORDER — MIRTAZAPINE 45 MG PO TABS
ORAL_TABLET | ORAL | 2 refills | Status: DC
  Filled 2021-04-04: qty 30, 30d supply, fill #0
  Filled 2021-05-20: qty 30, 30d supply, fill #1
  Filled 2021-07-03: qty 30, 30d supply, fill #2

## 2021-04-04 MED ORDER — ESCITALOPRAM OXALATE 20 MG PO TABS
ORAL_TABLET | ORAL | 2 refills | Status: DC
  Filled 2021-04-04: qty 30, 30d supply, fill #0
  Filled 2021-05-20: qty 30, 30d supply, fill #1
  Filled 2021-07-03: qty 30, 30d supply, fill #2

## 2021-04-05 ENCOUNTER — Other Ambulatory Visit: Payer: Self-pay

## 2021-04-19 ENCOUNTER — Telehealth: Payer: Self-pay | Admitting: Pharmacist

## 2021-04-19 NOTE — Telephone Encounter (Signed)
Patient approved for medication assistance at [facility] until 01/20/22, as long as eligibility criteria continues to be met.   Vonda [facility] Administrative Assistant 

## 2021-04-26 ENCOUNTER — Ambulatory Visit: Payer: Medicaid Other | Admitting: Gerontology

## 2021-04-26 ENCOUNTER — Other Ambulatory Visit: Payer: Self-pay

## 2021-04-26 ENCOUNTER — Encounter: Payer: Self-pay | Admitting: Gerontology

## 2021-04-26 VITALS — BP 110/62 | HR 78 | Temp 97.4°F | Resp 16 | Ht 70.0 in | Wt 262.0 lb

## 2021-04-26 DIAGNOSIS — R7303 Prediabetes: Secondary | ICD-10-CM

## 2021-04-26 MED ORDER — METFORMIN HCL 1000 MG PO TABS
500.0000 mg | ORAL_TABLET | Freq: Every day | ORAL | 3 refills | Status: DC
  Filled 2021-04-26 – 2021-06-02 (×2): qty 30, 60d supply, fill #0
  Filled 2021-09-17: qty 15, 30d supply, fill #1
  Filled 2021-10-24: qty 15, 30d supply, fill #2

## 2021-04-26 NOTE — Progress Notes (Signed)
Established Patient Office Visit  Subjective:  Patient ID: Brian Rios, male    DOB: April 02, 1981  Age: 40 y.o. MRN: [id number]  CC:  Chief Complaint  Patient presents with   Follow-up    HPI Brian Rios  is a 40 y/o male who has history of Alcohol abuse, Depression, Drug abuse, CVA, presents for routine follow up and lab review. His HgbA1c done on 03/28/21 was 6% and his other labs were unremarkable. He states that he's compliant with Metformin, denies any side effects. He states that his mood is good, denies suicidal/homicidal ideation and he continues to follow up at [facility]. Overall, he states that he's doing well and offers no further complaint.  Past Medical History:  Diagnosis Date   Alcohol abuse    Depression    Drug abuse ([place])    Tobacco abuse     Past Surgical History:  Procedure Laterality Date   ORIF ORBITAL FRACTURE     TEE WITHOUT CARDIOVERSION N/A 07/19/2020   Procedure: TRANSESOPHAGEAL ECHOCARDIOGRAM (TEE);  Surgeon: Corey Skains, MD;  Location: [facility] ORS;  Service: Cardiovascular;  Laterality: N/A;    Family History  Problem Relation Age of Onset   Alcoholism Mother    Hypertension Mother    Hyperlipidemia Mother    Breast cancer Mother    Alcoholism Father    Diabetes Maternal Grandmother     Social History   Socioeconomic History   Marital status: Single    Spouse name: Not on file   Number of children: Not on file   Years of education: Not on file   Highest education level: Not on file  Occupational History   Not on file  Tobacco Use   Smoking status: Every Day    Packs/day: 0.30    Years: 15.00    Pack years: 4.50    Types: Cigarettes   Smokeless tobacco: Former    Quit date: 03/09/2003   Tobacco comments:    1 pack every 3 days  Vaping Use   Vaping Use: Never used  Substance and Sexual Activity   Alcohol use: Yes    Alcohol/week: 7.0 standard drinks    Types: 7 Cans of beer per week   Drug use: Not Currently     Types: "Crack" cocaine    Comment: last use 2016   Sexual activity: Not on file  Other Topics Concern   Not on file  Social History Narrative   Not on file   Social Determinants of Health   Financial Resource Strain: Not on file  Food Insecurity: No Food Insecurity   Worried About Running Out of Food in the Last Year: Never true   Ran Out of Food in the Last Year: Never true  Transportation Needs: No Transportation Needs   Lack of Transportation (Medical): No   Lack of Transportation (Non-Medical): No  Physical Activity: Not on file  Stress: Not on file  Social Connections: Not on file  Intimate Partner Violence: Not on file    Outpatient Medications Prior to Visit  Medication Sig Dispense Refill   ARIPiprazole (ABILIFY) 30 MG tablet TAKE ONE TABLET BY MOUTH ONCE DAILY AT BEDTIME. 30 tablet 2   ARIPiprazole ER (ABILIFY MAINTENA) 400 MG PRSY prefilled syringe Inject 400 mg into the muscle every 30 (thirty) days.     atorvastatin (LIPITOR) 40 MG tablet TAKE ONE TABLET BY MOUTH EVERY DAY 30 tablet 3   benztropine (COGENTIN) 1 MG tablet TAKE ONE TABLET BY MOUTH ONCE DAILY  AT BEDTIME. 30 tablet 2   divalproex (DEPAKOTE ER) 250 MG 24 hr tablet TAKE 3 TABLETS (750MG TOTAL) BY MOUTH ONCE DAILY AT BEDTIME. 90 tablet 2   escitalopram (LEXAPRO) 20 MG tablet TAKE ONE TABLET BY MOUTH ONCE DAILY. 30 tablet 2   hydrOXYzine (ATARAX/VISTARIL) 25 MG tablet TAKE 3 TABLETS (75MG TOTAL) BY MOUTH ONCE DAILY AT BEDTIME AS NEEDED. 90 tablet 2   mirtazapine (REMERON) 45 MG tablet TAKE ONE TABLET BY MOUTH ONCE DAILY AT BEDTIME. 30 tablet 2   thiamine (VITAMIN B-1) 100 MG tablet TAKE ONE TABLET BY MOUTH EVERY DAY 30 tablet 3   valbenazine (INGREZZA) 80 MG capsule TAKE ONE CAPSULE BY MOUTH AT BEDTIME 30 capsule 3   metFORMIN (GLUCOPHAGE) 1000 MG tablet Take (1/2) tablet (500 mg total) by mouth daily with breakfast. 30 tablet 0   ARIPiprazole (ABILIFY) 30 MG tablet TAKE ONE TABLET BY MOUTH AT BEDTIME 30  tablet 2   benztropine (COGENTIN) 1 MG tablet TAKE ONE TABLET BY MOUTH AT BEDTIME 30 tablet 2   folic acid (FOLVITE) 1 MG tablet TAKE ONE TABLET BY MOUTH EVERY DAY 30 tablet 3   hydrOXYzine (ATARAX/VISTARIL) 25 MG tablet TAKE 1 TO 2 TABLETS BY MOUTH AT BEDTIME AS NEEDED 60 tablet 2   divalproex (DEPAKOTE ER) 250 MG 24 hr tablet TAKE 3 TABLETS BY MOUTH AT BEDTIME 90 tablet 2   escitalopram (LEXAPRO) 20 MG tablet TAKE ONE TABLET BY MOUTH EVERY DAY 30 tablet 2   mirtazapine (REMERON) 45 MG tablet TAKE ONE TABLET BY MOUTH AT BEDTIME 30 tablet 2   No facility-administered medications prior to visit.    No Known Allergies  ROS Review of Systems  Constitutional: Negative.   Respiratory: Negative.    Cardiovascular: Negative.   Endocrine: Negative.   Skin: Negative.   Neurological: Negative.   Psychiatric/Behavioral: Negative.       Objective:    Physical Exam HENT:     Head: Normocephalic and atraumatic.     Mouth/Throat:     Mouth: Mucous membranes are moist.  Cardiovascular:     Rate and Rhythm: Normal rate and regular rhythm.     Pulses: Normal pulses.     Heart sounds: Normal heart sounds.  Pulmonary:     Effort: Pulmonary effort is normal.     Breath sounds: Normal breath sounds.  Abdominal:     General: Bowel sounds are normal.     Palpations: Abdomen is soft.  Skin:    General: Skin is warm.  Neurological:     General: No focal deficit present.     Mental Status: He is alert and oriented to person, place, and time. Mental status is at baseline.  Psychiatric:        Mood and Affect: Mood normal.        Behavior: Behavior normal.        Thought Content: Thought content normal.        Judgment: Judgment normal.    BP 110/62 (BP Location: Right Arm, Patient Position: Sitting, Cuff Size: Large) Comment: manual  Pulse 78   Temp (!) 97.4 F (36.3 C)   Resp 16   Ht 5' 10" (1.778 m)   Wt 262 lb (118.8 kg)   SpO2 94%   BMI 37.59 kg/m  Wt Readings from Last 3  Encounters:  04/26/21 262 lb (118.8 kg)  03/28/21 263 lb 8 oz (119.5 kg)  03/08/21 259 lb (117.5 kg)   Encouraged weight loss  Health  Maintenance Due  Topic Date Due   COVID-19 Vaccine (1) Never done   Pneumococcal Vaccine 8-38 Years old (1 - PCV) Never done   Hepatitis C Screening  Never done    There are no preventive care reminders to display for this patient.  Lab Results  Component Value Date   TSH 4.040 03/28/2021   Lab Results  Component Value Date   WBC 10.9 (H) 03/28/2021   HGB 15.0 03/28/2021   HCT 45.0 03/28/2021   MCV 82 03/28/2021   PLT 246 03/28/2021   Lab Results  Component Value Date   NA 141 03/28/2021   K 4.4 03/28/2021   CO2 21 03/28/2021   GLUCOSE 85 03/28/2021   BUN 6 03/28/2021   CREATININE 0.96 03/28/2021   BILITOT <0.2 03/28/2021   ALKPHOS 58 03/28/2021   AST 14 03/28/2021   ALT 13 03/28/2021   PROT 5.6 (L) 03/28/2021   ALBUMIN 3.6 (L) 03/28/2021   CALCIUM 8.8 03/28/2021   ANIONGAP 10 07/17/2020   EGFR 102 03/28/2021   Lab Results  Component Value Date   CHOL 144 03/28/2021   Lab Results  Component Value Date   HDL 48 03/28/2021   Lab Results  Component Value Date   LDLCALC 80 03/28/2021   Lab Results  Component Value Date   TRIG 83 03/28/2021   Lab Results  Component Value Date   CHOLHDL 3.0 03/28/2021   Lab Results  Component Value Date   HGBA1C 6.0 (H) 03/28/2021      Assessment & Plan:   1. Prediabetes - His HgbA1c was 6%, he will continue on current medication, low carb/non concentrated sweet diet and exercise as tolerated. - metFORMIN (GLUCOPHAGE) 1000 MG tablet; Take (1/2) tablet (500 mg total) by mouth daily with breakfast.  Dispense: 30 tablet; Refill: 3      Follow-up: Return in about 13 weeks (around 07/26/2021), or if symptoms worsen or fail to improve.     Jerold Coombe, NP

## 2021-04-26 NOTE — Patient Instructions (Signed)
https://www.diabeteseducator.org/docs/default-source/living-with-diabetes/conquering-the-grocery-store-v1.pdf?sfvrsn=4">  Carbohydrate Counting for Diabetes Mellitus, Adult Carbohydrate counting is a method of keeping track of how many carbohydrates you eat. Eating carbohydrates naturally increases the amount of sugar (glucose) in the blood. Counting how many carbohydrates you eat improves your bloodglucose control, which helps you manage your diabetes. It is important to know how many carbohydrates you can safely have in each meal. This is different for every person. A dietitian can help you make a meal plan and calculate how many carbohydrates you should have at each meal andsnack. What foods contain carbohydrates? Carbohydrates are found in the following foods: Grains, such as breads and cereals. Dried beans and soy products. Starchy vegetables, such as potatoes, peas, and corn. Fruit and fruit juices. Milk and yogurt. Sweets and snack foods, such as cake, cookies, candy, chips, and soft drinks. How do I count carbohydrates in foods? There are two ways to count carbohydrates in food. You can read food labels or learn standard serving sizes of foods. You can use either of the methods or acombination of both. Using the Nutrition Facts label The Nutrition Facts list is included on the labels of almost all packaged foods and beverages in the [place] It includes: The serving size. Information about nutrients in each serving, including the grams (g) of carbohydrate per serving. To use the Nutrition Facts: Decide how many servings you will have. Multiply the number of servings by the number of carbohydrates per serving. The resulting number is the total amount of carbohydrates that you will be having. Learning the standard serving sizes of foods When you eat carbohydrate foods that are not packaged or do not include Nutrition Facts on the label, you need to measure the servings in order to count the  amount of carbohydrates. Measure the foods that you will eat with a food scale or measuring cup, if needed. Decide how many standard-size servings you will eat. Multiply the number of servings by 15. For foods that contain carbohydrates, one serving equals 15 g of carbohydrates. For example, if you eat 2 cups or 10 oz (300 g) of strawberries, you will have eaten 2 servings and 30 g of carbohydrates (2 servings x 15 g = 30 g). For foods that have more than one food mixed, such as soups and casseroles, you must count the carbohydrates in each food that is included. The following list contains standard serving sizes of common carbohydrate-rich foods. Each of these servings has about 15 g of carbohydrates: 1 slice of bread. 1 six-inch (15 cm) tortilla. ? cup or 2 oz (53 g) cooked rice or pasta.  cup or 3 oz (85 g) cooked or canned, drained and rinsed beans or lentils.  cup or 3 oz (85 g) starchy vegetable, such as peas, corn, or squash.  cup or 4 oz (120 g) hot cereal.  cup or 3 oz (85 g) boiled or mashed potatoes, or  or 3 oz (85 g) of a large baked potato.  cup or 4 fl oz (118 mL) fruit juice. 1 cup or 8 fl oz (237 mL) milk. 1 small or 4 oz (106 g) apple.  or 2 oz (63 g) of a medium banana. 1 cup or 5 oz (150 g) strawberries. 3 cups or 1 oz (24 g) popped popcorn. What is an example of carbohydrate counting? To calculate the number of carbohydrates in this sample meal, follow the stepsshown below. Sample meal 3 oz (85 g) chicken breast. ? cup or 4 oz (106 g) brown rice.    cup or 3 oz (85 g) corn. 1 cup or 8 fl oz (237 mL) milk. 1 cup or 5 oz (150 g) strawberries with sugar-free whipped topping. Carbohydrate calculation Identify the foods that contain carbohydrates: Rice. Corn. Milk. Strawberries. Calculate how many servings you have of each food: 2 servings rice. 1 serving corn. 1 serving milk. 1 serving strawberries. Multiply each number of servings by 15 g: 2 servings  rice x 15 g = 30 g. 1 serving corn x 15 g = 15 g. 1 serving milk x 15 g = 15 g. 1 serving strawberries x 15 g = 15 g. Add together all of the amounts to find the total grams of carbohydrates eaten: 30 g + 15 g + 15 g + 15 g = 75 g of carbohydrates total. What are tips for following this plan? Shopping Develop a meal plan and then make a shopping list. Buy fresh and frozen vegetables, fresh and frozen fruit, dairy, eggs, beans, lentils, and whole grains. Look at food labels. Choose foods that have more fiber and less sugar. Avoid processed foods and foods with added sugars. Meal planning Aim to have the same amount of carbohydrates at each meal and for each snack time. Plan to have regular, balanced meals and snacks. Where to find more information American Diabetes Association: www.diabetes.org Centers for Disease Control and Prevention: www.cdc.gov Summary Carbohydrate counting is a method of keeping track of how many carbohydrates you eat. Eating carbohydrates naturally increases the amount of sugar (glucose) in the blood. Counting how many carbohydrates you eat improves your blood glucose control, which helps you manage your diabetes. A dietitian can help you make a meal plan and calculate how many carbohydrates you should have at each meal and snack. This information is not intended to replace advice given to you by your health care provider. Make sure you discuss any questions you have with your healthcare provider. Document Revised: 10/08/2019 Document Reviewed: 10/09/2019 Elsevier Patient Education  2021 [employer]  

## 2021-04-27 ENCOUNTER — Other Ambulatory Visit: Payer: Self-pay

## 2021-05-22 ENCOUNTER — Other Ambulatory Visit: Payer: Self-pay

## 2021-06-02 ENCOUNTER — Other Ambulatory Visit: Payer: Self-pay

## 2021-06-28 ENCOUNTER — Ambulatory Visit: Payer: Medicaid Other | Admitting: Gerontology

## 2021-07-03 ENCOUNTER — Other Ambulatory Visit: Payer: Self-pay

## 2021-07-04 ENCOUNTER — Other Ambulatory Visit: Payer: Self-pay

## 2021-07-05 ENCOUNTER — Other Ambulatory Visit: Payer: Self-pay

## 2021-07-26 ENCOUNTER — Other Ambulatory Visit: Payer: Self-pay

## 2021-07-26 ENCOUNTER — Encounter: Payer: Self-pay | Admitting: Gerontology

## 2021-07-26 ENCOUNTER — Ambulatory Visit: Payer: Medicaid Other | Admitting: Gerontology

## 2021-07-26 VITALS — BP 144/92 | HR 81 | Temp 97.4°F | Ht 70.0 in | Wt 257.7 lb

## 2021-07-26 DIAGNOSIS — R030 Elevated blood-pressure reading, without diagnosis of hypertension: Secondary | ICD-10-CM | POA: Insufficient documentation

## 2021-07-26 DIAGNOSIS — N529 Male erectile dysfunction, unspecified: Secondary | ICD-10-CM | POA: Insufficient documentation

## 2021-07-26 DIAGNOSIS — Z8639 Personal history of other endocrine, nutritional and metabolic disease: Secondary | ICD-10-CM

## 2021-07-26 DIAGNOSIS — R7303 Prediabetes: Secondary | ICD-10-CM

## 2021-07-26 NOTE — Progress Notes (Signed)
Established Patient Office Visit  Subjective:  Patient ID: Brian Rios, male    DOB: [birth year]  Age: 40 y.o. MRN: [id number]  CC:  Chief Complaint  Patient presents with   Follow-up    Follow-up for prediabetes. Pt is doing well    HPI Jlyn Bracamonte  is a 40 y/o male who has history of Alcohol abuse, Depression, Drug abuse, CVA presents for routine follow up. His blood pressure was elevated, he continues to smoke 1 pack of cigarette a day and admits the desire to quit.  He states that he is compliant with his medications and denies any side effect.  He states that his mood is good, denies suicidal nor homicidal ideation, and he receives his mental health care services at [facility].  He reports experiencing difficulty achieving and maintaing erection, denies penile pain no discharge.  Overall he states that he is doing well and offers no further complaint.  Past Medical History:  Diagnosis Date   Alcohol abuse    Depression    Drug abuse ([place])    Tobacco abuse     Past Surgical History:  Procedure Laterality Date   ORIF ORBITAL FRACTURE     TEE WITHOUT CARDIOVERSION N/A 07/19/2020   Procedure: TRANSESOPHAGEAL ECHOCARDIOGRAM (TEE);  Surgeon: Corey Skains, MD;  Location: [facility] ORS;  Service: Cardiovascular;  Laterality: N/A;    Family History  Problem Relation Age of Onset   Alcoholism Mother    Hypertension Mother    Hyperlipidemia Mother    Breast cancer Mother    Alcoholism Father    Diabetes Maternal Grandmother     Social History   Socioeconomic History   Marital status: Single    Spouse name: Not on file   Number of children: Not on file   Years of education: Not on file   Highest education level: Not on file  Occupational History   Not on file  Tobacco Use   Smoking status: Every Day    Packs/day: 1.00    Years: 15.00    Pack years: 15.00    Types: Cigarettes   Smokeless tobacco: Former    Quit date: 03/09/2003   Tobacco comments:    1 pack  every 3 days  Vaping Use   Vaping Use: Never used  Substance and Sexual Activity   Alcohol use: Yes    Alcohol/week: 7.0 standard drinks    Types: 7 Cans of beer per week   Drug use: Not Currently    Types: "Crack" cocaine    Comment: last use 2016   Sexual activity: Not on file  Other Topics Concern   Not on file  Social History Narrative   Not on file   Social Determinants of Health   Financial Resource Strain: Not on file  Food Insecurity: No Food Insecurity   Worried About Running Out of Food in the Last Year: Never true   Ran Out of Food in the Last Year: Never true  Transportation Needs: No Transportation Needs   Lack of Transportation (Medical): No   Lack of Transportation (Non-Medical): No  Physical Activity: Not on file  Stress: Not on file  Social Connections: Not on file  Intimate Partner Violence: Not on file    Outpatient Medications Prior to Visit  Medication Sig Dispense Refill   ARIPiprazole (ABILIFY) 30 MG tablet TAKE ONE TABLET BY MOUTH ONCE DAILY AT BEDTIME. 30 tablet 2   atorvastatin (LIPITOR) 40 MG tablet TAKE ONE TABLET BY MOUTH EVERY DAY  30 tablet 3   benztropine (COGENTIN) 1 MG tablet TAKE ONE TABLET BY MOUTH ONCE DAILY AT BEDTIME. 30 tablet 2   divalproex (DEPAKOTE ER) 250 MG 24 hr tablet TAKE 3 TABLETS ($RemoveBe'750MG'PQXCnSLJa$  TOTAL) BY MOUTH ONCE DAILY AT BEDTIME. 90 tablet 2   escitalopram (LEXAPRO) 20 MG tablet TAKE ONE TABLET BY MOUTH ONCE DAILY. 30 tablet 2   folic acid (FOLVITE) 1 MG tablet TAKE ONE TABLET BY MOUTH EVERY DAY 30 tablet 3   hydrOXYzine (ATARAX/VISTARIL) 25 MG tablet TAKE 3 TABLETS ($RemoveBe'75MG'VPXlfCidZ$  TOTAL) BY MOUTH ONCE DAILY AT BEDTIME AS NEEDED. 90 tablet 2   metFORMIN (GLUCOPHAGE) 1000 MG tablet Take (1/2) tablet (500 mg total) by mouth daily with breakfast. 30 tablet 3   thiamine (VITAMIN B-1) 100 MG tablet TAKE ONE TABLET BY MOUTH EVERY DAY 30 tablet 3   ARIPiprazole ER (ABILIFY MAINTENA) 400 MG PRSY prefilled syringe Inject 400 mg into the muscle every  30 (thirty) days.     mirtazapine (REMERON) 45 MG tablet TAKE ONE TABLET BY MOUTH ONCE DAILY AT BEDTIME. 30 tablet 2   valbenazine (INGREZZA) 80 MG capsule TAKE ONE CAPSULE BY MOUTH AT BEDTIME 30 capsule 3   No facility-administered medications prior to visit.    No Known Allergies  ROS Review of Systems  Constitutional: Negative.   Eyes: Negative.   Respiratory: Negative.    Cardiovascular: Negative.   Neurological: Negative.   Psychiatric/Behavioral: Negative.       Objective:    Physical Exam HENT:     Head: Normocephalic and atraumatic.     Mouth/Throat:     Mouth: Mucous membranes are moist.  Eyes:     Extraocular Movements: Extraocular movements intact.     Conjunctiva/sclera: Conjunctivae normal.     Pupils: Pupils are equal, round, and reactive to light.  Cardiovascular:     Rate and Rhythm: Normal rate and regular rhythm.     Pulses: Normal pulses.     Heart sounds: Normal heart sounds.  Pulmonary:     Effort: Pulmonary effort is normal.     Breath sounds: Normal breath sounds.  Neurological:     General: No focal deficit present.     Mental Status: He is alert and oriented to person, place, and time. Mental status is at baseline.  Psychiatric:        Mood and Affect: Mood normal.        Behavior: Behavior normal.        Thought Content: Thought content normal.        Judgment: Judgment normal.    BP (!) 144/92 (BP Location: Left Arm, Patient Position: Sitting, Cuff Size: Normal)   Pulse 81   Temp (!) 97.4 F (36.3 C)   Ht $R'5\' 10"'IR$  (1.778 m)   Wt 257 lb 11.2 oz (116.9 kg)   SpO2 91%   BMI 36.98 kg/m  Wt Readings from Last 3 Encounters:  07/26/21 257 lb 11.2 oz (116.9 kg)  04/26/21 262 lb (118.8 kg)  03/28/21 263 lb 8 oz (119.5 kg)   Encouraged weight loss  Health Maintenance Due  Topic Date Due   COVID-19 Vaccine (1) Never done   Hepatitis C Screening  Never done   INFLUENZA VACCINE  Never done    There are no preventive care reminders to  display for this patient.  Lab Results  Component Value Date   TSH 4.040 03/28/2021   Lab Results  Component Value Date   WBC 10.9 (H) 03/28/2021  HGB 15.0 03/28/2021   HCT 45.0 03/28/2021   MCV 82 03/28/2021   PLT 246 03/28/2021   Lab Results  Component Value Date   NA 141 03/28/2021   K 4.4 03/28/2021   CO2 21 03/28/2021   GLUCOSE 85 03/28/2021   BUN 6 03/28/2021   CREATININE 0.96 03/28/2021   BILITOT <0.2 03/28/2021   ALKPHOS 58 03/28/2021   AST 14 03/28/2021   ALT 13 03/28/2021   PROT 5.6 (L) 03/28/2021   ALBUMIN 3.6 (L) 03/28/2021   CALCIUM 8.8 03/28/2021   ANIONGAP 10 07/17/2020   EGFR 102 03/28/2021   Lab Results  Component Value Date   CHOL 144 03/28/2021   Lab Results  Component Value Date   HDL 48 03/28/2021   Lab Results  Component Value Date   LDLCALC 80 03/28/2021   Lab Results  Component Value Date   TRIG 83 03/28/2021   Lab Results  Component Value Date   CHOLHDL 3.0 03/28/2021   Lab Results  Component Value Date   HGBA1C 6.0 (H) 03/28/2021      Assessment & Plan:    1. Elevated blood pressure reading -His blood pressure was elevated during visit at 144/92, he was advised to check blood pressure daily, record and bring the log to follow-up appointment.  He was encouraged on smoking cessation, continue on DASH diet and exercise as tolerated. - Comp Met (CMET); Future  2. Prediabetes -He will continue on metformin, low-carb/no concentrated sweet diet and exercise as tolerated.  We will recheck hemoglobin A1c. - HgB A1c; Future  3. H/O elevated lipids -He is tolerating his statin, will continue on medication, low-fat/cholesterol diet and exercise as tolerated. - Lipid panel; Future  4. Erectile dysfunction, unspecified erectile dysfunction type -He was encouraged to complete: Financial application for urology referral for evaluation of his erectile dysfunction. - Ambulatory referral to Urology    Follow-up: Return in about 5  weeks (around 08/30/2021), or if symptoms worsen or fail to improve.    Jakyle Petrucelli Jerold Coombe, NP

## 2021-07-26 NOTE — Patient Instructions (Signed)
Heart-Healthy Eating Plan Many factors influence your heart (coronary) health, including eating and exercise habits. Coronary risk increases with abnormal blood fat (lipid) levels. Heart-healthy meal planning includes limiting unhealthy fats, increasing healthy fats, and making other diet and lifestyle changes. What is my plan? Your health care provider may recommend that you: Limit your fat intake to _________% or less of your total calories each day. Limit your saturated fat intake to _________% or less of your total calories each day. Limit the amount of cholesterol in your diet to less than _________ mg per day. What are tips for following this plan? Cooking Cook foods using methods other than frying. Baking, boiling, grilling, and broiling are all good options. Other ways to reduce fat include: Removing the skin from poultry. Removing all visible fats from meats. Steaming vegetables in water or broth. Meal planning  At meals, imagine dividing your plate into fourths: Fill one-half of your plate with vegetables and green salads. Fill one-fourth of your plate with whole grains. Fill one-fourth of your plate with lean protein foods. Eat 4-5 servings of vegetables per day. One serving equals 1 cup raw or cooked vegetable, or 2 cups raw leafy greens. Eat 4-5 servings of fruit per day. One serving equals 1 medium whole fruit,  cup dried fruit,  cup fresh, frozen, or canned fruit, or  cup 100% fruit juice. Eat more foods that contain soluble fiber. Examples include apples, broccoli, carrots, beans, peas, and barley. Aim to get 25-30 g of fiber per day. Increase your consumption of legumes, nuts, and seeds to 4-5 servings per week. One serving of dried beans or legumes equals  cup cooked, 1 serving of nuts is  cup, and 1 serving of seeds equals 1 tablespoon. Fats Choose healthy fats more often. Choose monounsaturated and polyunsaturated fats, such as olive and canola oils, flaxseeds,  walnuts, almonds, and seeds. Eat more omega-3 fats. Choose salmon, mackerel, sardines, tuna, flaxseed oil, and ground flaxseeds. Aim to eat fish at least 2 times each week. Check food labels carefully to identify foods with trans fats or high amounts of saturated fat. Limit saturated fats. These are found in animal products, such as meats, butter, and cream. Plant sources of saturated fats include palm oil, palm kernel oil, and coconut oil. Avoid foods with partially hydrogenated oils in them. These contain trans fats. Examples are stick margarine, some tub margarines, cookies, crackers, and other baked goods. Avoid fried foods. General information Eat more home-cooked food and less restaurant, buffet, and fast food. Limit or avoid alcohol. Limit foods that are high in starch and sugar. Lose weight if you are overweight. Losing just 5-10% of your body weight can help your overall health and prevent diseases such as diabetes and heart disease. Monitor your salt (sodium) intake, especially if you have high blood pressure. Talk with your health care provider about your sodium intake. Try to incorporate more vegetarian meals weekly. What foods can I eat? Fruits All fresh, canned (in natural juice), or frozen fruits. Vegetables Fresh or frozen vegetables (raw, steamed, roasted, or grilled). Green salads. Grains Most grains. Choose whole wheat and whole grains most of the time. Rice and pasta, including brown rice and pastas made with whole wheat. Meats and other proteins Lean, well-trimmed beef, veal, pork, and lamb. Chicken and [place] without skin. All fish and shellfish. Wild duck, rabbit, pheasant, and venison. Egg whites or low-cholesterol egg substitutes. Dried beans, peas, lentils, and tofu. Seeds and most nuts. Dairy Low-fat or nonfat cheeses,  including ricotta and mozzarella. Skim or 1% milk (liquid, powdered, or evaporated). Buttermilk made with low-fat milk. Nonfat or low-fat  yogurt. Fats and oils Non-hydrogenated (trans-free) margarines. Vegetable oils, including soybean, sesame, sunflower, olive, peanut, safflower, corn, canola, and cottonseed. Salad dressings or mayonnaise made with a vegetable oil. Beverages Water (mineral or sparkling). Coffee and tea. Diet carbonated beverages. Sweets and desserts Sherbet, gelatin, and fruit ice. Small amounts of dark chocolate. Limit all sweets and desserts. Seasonings and condiments All seasonings and condiments. The items listed above may not be a complete list of foods and beverages you can eat. Contact a dietitian for more options. What foods are not recommended? Fruits Canned fruit in heavy syrup. Fruit in cream or butter sauce. Fried fruit. Limit coconut. Vegetables Vegetables cooked in cheese, cream, or butter sauce. Fried vegetables. Grains Breads made with saturated or trans fats, oils, or whole milk. Croissants. Sweet rolls. Donuts. High-fat crackers, such as cheese crackers. Meats and other proteins Fatty meats, such as hot dogs, ribs, sausage, bacon, rib-eye roast or steak. High-fat deli meats, such as salami and bologna. Caviar. Domestic duck and goose. Organ meats, such as liver. Dairy Cream, sour cream, cream cheese, and creamed cottage cheese. Whole milk cheeses. Whole or 2% milk (liquid, evaporated, or condensed). Whole buttermilk. Cream sauce or high-fat cheese sauce. Whole-milk yogurt. Fats and oils Meat fat, or shortening. Cocoa butter, hydrogenated oils, palm oil, coconut oil, palm kernel oil. Solid fats and shortenings, including bacon fat, salt pork, lard, and butter. Nondairy cream substitutes. Salad dressings with cheese or sour cream. Beverages Regular sodas and any drinks with added sugar. Sweets and desserts Frosting. Pudding. Cookies. Cakes. Pies. Milk chocolate or white chocolate. Buttered syrups. Full-fat ice cream or ice cream drinks. The items listed above may not be a complete list of  foods and beverages to avoid. Contact a dietitian for more information. Summary Heart-healthy meal planning includes limiting unhealthy fats, increasing healthy fats, and making other diet and lifestyle changes. Lose weight if you are overweight. Losing just 5-10% of your body weight can help your overall health and prevent diseases such as diabetes and heart disease. Focus on eating a balance of foods, including fruits and vegetables, low-fat or nonfat dairy, lean protein, nuts and legumes, whole grains, and heart-healthy oils and fats. This information is not intended to replace advice given to you by your health care provider. Make sure you discuss any questions you have with your health care provider. Document Revised: 11/15/2017 Document Reviewed: 11/15/2017 Elsevier Patient Education  2022 [place] Eating Plan DASH stands for Dietary Approaches to Stop Hypertension. The DASH eating plan is a healthy eating plan that has been shown to: Reduce high blood pressure (hypertension). Reduce your risk for type 2 diabetes, heart disease, and stroke. Help with weight loss. What are tips for following this plan? Reading food labels Check food labels for the amount of salt (sodium) per serving. Choose foods with less than 5 percent of the Daily Value of sodium. Generally, foods with less than 300 milligrams (mg) of sodium per serving fit into this eating plan. To find whole grains, look for the word "whole" as the first word in the ingredient list. Shopping Buy products labeled as "low-sodium" or "no salt added." Buy fresh foods. Avoid canned foods and pre-made or frozen meals. Cooking Avoid adding salt when cooking. Use salt-free seasonings or herbs instead of table salt or sea salt. Check with your health care provider or pharmacist before using salt  substitutes. Do not fry foods. Cook foods using healthy methods such as baking, boiling, grilling, roasting, and broiling instead. Cook  with heart-healthy oils, such as olive, canola, avocado, soybean, or sunflower oil. Meal planning  Eat a balanced diet that includes: 4 or more servings of fruits and 4 or more servings of vegetables each day. Try to fill one-half of your plate with fruits and vegetables. 6-8 servings of whole grains each day. Less than 6 oz (170 g) of lean meat, poultry, or fish each day. A 3-oz (85-g) serving of meat is about the same size as a deck of cards. One egg equals 1 oz (28 g). 2-3 servings of low-fat dairy each day. One serving is 1 cup (237 mL). 1 serving of nuts, seeds, or beans 5 times each week. 2-3 servings of heart-healthy fats. Healthy fats called omega-3 fatty acids are found in foods such as walnuts, flaxseeds, fortified milks, and eggs. These fats are also found in cold-water fish, such as sardines, salmon, and mackerel. Limit how much you eat of: Canned or prepackaged foods. Food that is high in trans fat, such as some fried foods. Food that is high in saturated fat, such as fatty meat. Desserts and other sweets, sugary drinks, and other foods with added sugar. Full-fat dairy products. Do not salt foods before eating. Do not eat more than 4 egg yolks a week. Try to eat at least 2 vegetarian meals a week. Eat more home-cooked food and less restaurant, buffet, and fast food. Lifestyle When eating at a restaurant, ask that your food be prepared with less salt or no salt, if possible. If you drink alcohol: Limit how much you use to: 0-1 drink a day for women who are not pregnant. 0-2 drinks a day for men. Be aware of how much alcohol is in your drink. In the [place] one drink equals one 12 oz bottle of beer (355 mL), one 5 oz glass of wine (148 mL), or one 1 oz glass of hard liquor (44 mL). General information Avoid eating more than 2,300 mg of salt a day. If you have hypertension, you may need to reduce your sodium intake to 1,500 mg a day. Work with your health care provider to  maintain a healthy body weight or to lose weight. Ask what an ideal weight is for you. Get at least 30 minutes of exercise that causes your heart to beat faster (aerobic exercise) most days of the week. Activities may include walking, swimming, or biking. Work with your health care provider or dietitian to adjust your eating plan to your individual calorie needs. What foods should I eat? Fruits All fresh, dried, or frozen fruit. Canned fruit in natural juice (without added sugar). Vegetables Fresh or frozen vegetables (raw, steamed, roasted, or grilled). Low-sodium or reduced-sodium tomato and vegetable juice. Low-sodium or reduced-sodium tomato sauce and tomato paste. Low-sodium or reduced-sodium canned vegetables. Grains Whole-grain or whole-wheat bread. Whole-grain or whole-wheat pasta. Brown rice. [place]. Bulgur. Whole-grain and low-sodium cereals. Pita bread. Low-fat, low-sodium crackers. Whole-wheat flour tortillas. Meats and other proteins Skinless chicken or [place]. Ground chicken or [place]. Pork with fat trimmed off. Fish and seafood. Egg whites. Dried beans, peas, or lentils. Unsalted nuts, nut butters, and seeds. Unsalted canned beans. Lean cuts of beef with fat trimmed off. Low-sodium, lean precooked or cured meat, such as sausages or meat loaves. Dairy Low-fat (1%) or fat-free (skim) milk. Reduced-fat, low-fat, or fat-free cheeses. Nonfat, low-sodium ricotta or cottage cheese. Low-fat or nonfat  yogurt. Low-fat, low-sodium cheese. Fats and oils Soft margarine without trans fats. Vegetable oil. Reduced-fat, low-fat, or light mayonnaise and salad dressings (reduced-sodium). Canola, safflower, olive, avocado, soybean, and sunflower oils. Avocado. Seasonings and condiments Herbs. Spices. Seasoning mixes without salt. Other foods Unsalted popcorn and pretzels. Fat-free sweets. The items listed above may not be a complete list of foods and beverages you can eat. Contact a dietitian  for more information. What foods should I avoid? Fruits Canned fruit in a light or heavy syrup. Fried fruit. Fruit in cream or butter sauce. Vegetables Creamed or fried vegetables. Vegetables in a cheese sauce. Regular canned vegetables (not low-sodium or reduced-sodium). Regular canned tomato sauce and paste (not low-sodium or reduced-sodium). Regular tomato and vegetable juice (not low-sodium or reduced-sodium). Angie Fava. Olives. Grains Baked goods made with fat, such as croissants, muffins, or some breads. Dry pasta or rice meal packs. Meats and other proteins Fatty cuts of meat. Ribs. Fried meat. Berniece Salines. Bologna, salami, and other precooked or cured meats, such as sausages or meat loaves. Fat from the back of a pig (fatback). Bratwurst. Salted nuts and seeds. Canned beans with added salt. Canned or smoked fish. Whole eggs or egg yolks. Chicken or [place] with skin. Dairy Whole or 2% milk, cream, and half-and-half. Whole or full-fat cream cheese. Whole-fat or sweetened yogurt. Full-fat cheese. Nondairy creamers. Whipped toppings. Processed cheese and cheese spreads. Fats and oils Butter. Stick margarine. Lard. Shortening. Ghee. Bacon fat. Tropical oils, such as coconut, palm kernel, or palm oil. Seasonings and condiments Onion salt, garlic salt, seasoned salt, table salt, and sea salt. Worcestershire sauce. Tartar sauce. Barbecue sauce. Teriyaki sauce. Soy sauce, including reduced-sodium. Steak sauce. Canned and packaged gravies. Fish sauce. Oyster sauce. Cocktail sauce. Store-bought horseradish. Ketchup. Mustard. Meat flavorings and tenderizers. Bouillon cubes. Hot sauces. Pre-made or packaged marinades. Pre-made or packaged taco seasonings. Relishes. Regular salad dressings. Other foods Salted popcorn and pretzels. The items listed above may not be a complete list of foods and beverages you should avoid. Contact a dietitian for more information. Where to find more information National Heart,  Lung, and Blood Institute: https://wilson-eaton.com/ American Heart Association: www.heart.org Academy of Nutrition and Dietetics: www.eatright.[place]: www.kidney.org Summary The DASH eating plan is a healthy eating plan that has been shown to reduce high blood pressure (hypertension). It may also reduce your risk for type 2 diabetes, heart disease, and stroke. When on the DASH eating plan, aim to eat more fresh fruits and vegetables, whole grains, lean proteins, low-fat dairy, and heart-healthy fats. With the DASH eating plan, you should limit salt (sodium) intake to 2,300 mg a day. If you have hypertension, you may need to reduce your sodium intake to 1,500 mg a day. Work with your health care provider or dietitian to adjust your eating plan to your individual calorie needs. This information is not intended to replace advice given to you by your health care provider. Make sure you discuss any questions you have with your health care provider. Document Revised: 09/11/2019 Document Reviewed: 09/11/2019 Elsevier Patient Education  2022 [employer].

## 2021-07-31 ENCOUNTER — Other Ambulatory Visit: Payer: Self-pay

## 2021-07-31 MED ORDER — ARIPIPRAZOLE 30 MG PO TABS
ORAL_TABLET | ORAL | 2 refills | Status: DC
  Filled 2021-08-05: qty 30, 30d supply, fill #0
  Filled 2021-09-18: qty 30, 30d supply, fill #1
  Filled 2021-10-23: qty 30, 30d supply, fill #2

## 2021-07-31 MED ORDER — MIRTAZAPINE 45 MG PO TABS
ORAL_TABLET | ORAL | 2 refills | Status: DC
  Filled 2021-08-05: qty 30, 30d supply, fill #0
  Filled 2021-09-17: qty 30, 30d supply, fill #1
  Filled 2021-10-23: qty 21, 21d supply, fill #2
  Filled 2021-11-27: qty 9, 9d supply, fill #3

## 2021-07-31 MED ORDER — HYDROXYZINE HCL 25 MG PO TABS
ORAL_TABLET | ORAL | 2 refills | Status: DC
  Filled 2021-08-05: qty 90, 30d supply, fill #0
  Filled 2021-09-17: qty 90, 30d supply, fill #1
  Filled 2021-10-23: qty 90, 30d supply, fill #2

## 2021-07-31 MED ORDER — ESCITALOPRAM OXALATE 20 MG PO TABS
ORAL_TABLET | ORAL | 2 refills | Status: DC
  Filled 2021-08-05: qty 30, 30d supply, fill #0
  Filled 2021-09-17: qty 30, 30d supply, fill #1
  Filled 2021-10-23: qty 30, 30d supply, fill #2

## 2021-07-31 MED ORDER — DIVALPROEX SODIUM ER 250 MG PO TB24
ORAL_TABLET | ORAL | 2 refills | Status: DC
  Filled 2021-08-05: qty 90, 30d supply, fill #0
  Filled 2021-09-17: qty 90, 30d supply, fill #1
  Filled 2021-10-23: qty 90, 30d supply, fill #2

## 2021-07-31 MED ORDER — BENZTROPINE MESYLATE 1 MG PO TABS
ORAL_TABLET | ORAL | 2 refills | Status: DC
  Filled 2021-08-05: qty 30, 30d supply, fill #0
  Filled 2021-09-17: qty 30, 30d supply, fill #1
  Filled 2021-10-23: qty 30, 30d supply, fill #2

## 2021-08-07 ENCOUNTER — Other Ambulatory Visit: Payer: Self-pay

## 2021-08-09 ENCOUNTER — Ambulatory Visit: Payer: Medicaid Other | Admitting: Urology

## 2021-08-23 ENCOUNTER — Ambulatory Visit (INDEPENDENT_AMBULATORY_CARE_PROVIDER_SITE_OTHER): Payer: Self-pay | Admitting: Urology

## 2021-08-23 ENCOUNTER — Other Ambulatory Visit: Payer: Self-pay

## 2021-08-23 ENCOUNTER — Encounter: Payer: Self-pay | Admitting: Urology

## 2021-08-23 VITALS — BP 153/93 | HR 76 | Ht 70.0 in | Wt 255.0 lb

## 2021-08-23 DIAGNOSIS — N529 Male erectile dysfunction, unspecified: Secondary | ICD-10-CM

## 2021-08-23 MED ORDER — TADALAFIL 5 MG PO TABS
5.0000 mg | ORAL_TABLET | Freq: Every day | ORAL | 11 refills | Status: DC | PRN
  Filled 2021-08-23: qty 9, 30d supply, fill #0
  Filled 2021-09-05 – 2021-09-17 (×2): qty 9, 30d supply, fill #1

## 2021-08-23 NOTE — Progress Notes (Signed)
   08/23/21 10:39 AM   Lauretta Grill 1980-11-05 [id number]  CC: Erectile dysfunction  HPI: 40 year old comorbid male with history of depression, alcohol abuse, drug abuse, and tobacco abuse who presents with at least 6 months of trouble with erections.  He does not get any erections overnight.  He has never tried medications for this.  No testosterone values to review.   PMH: Past Medical History:  Diagnosis Date   Alcohol abuse    Depression    Drug abuse ([place])    Tobacco abuse     Surgical History: Past Surgical History:  Procedure Laterality Date   ORIF ORBITAL FRACTURE     TEE WITHOUT CARDIOVERSION N/A 07/19/2020   Procedure: TRANSESOPHAGEAL ECHOCARDIOGRAM (TEE);  Surgeon: Corey Skains, MD;  Location: [facility] ORS;  Service: Cardiovascular;  Laterality: N/A;      Family History: Family History  Problem Relation Age of Onset   Alcoholism Mother    Hypertension Mother    Hyperlipidemia Mother    Breast cancer Mother    Alcoholism Father    Diabetes Maternal Grandmother     Social History:  reports that he has been smoking cigarettes. He has a 15.00 pack-year smoking history. He quit smokeless tobacco use about 18 years ago. He reports current alcohol use of about 7.0 standard drinks per week. He reports that he does not currently use drugs after having used the following drugs: "Crack" cocaine.  Physical Exam: BP (!) 153/93   Pulse 76   Ht 5\' 10"  (1.778 m)   Wt 255 lb (115.7 kg)   BMI 36.59 kg/m    Constitutional:  Alert and oriented, No acute distress. Cardiovascular: No clubbing, cyanosis, or edema. Respiratory: Normal respiratory effort, no increased work of breathing. GI: Abdomen is soft, nontender, nondistended, no abdominal masses  Assessment & Plan:   40 year old male with 6 months of trouble with erections, has not tried any PDE 5 inhibitors at this point.  We reviewed the AUA guidelines that recommend checking a testosterone, but he would  like to avoid blood work at this time.  He is amenable to considering testosterone if he has no improvement on Cialis 5 mg daily.  Risk and benefits discussed  -Trial of Cialis 5 mg daily, okay to increase if needed -RTC 6 weeks symptom check-> consider increasing dose and/or checking testosterone at that visit if no improvement   Nickolas Madrid, MD 08/23/2021  [place] [address], [place], [place] [address] [phone]

## 2021-08-30 ENCOUNTER — Ambulatory Visit: Payer: Medicaid Other | Admitting: Gerontology

## 2021-09-05 ENCOUNTER — Other Ambulatory Visit: Payer: Self-pay

## 2021-09-18 ENCOUNTER — Other Ambulatory Visit: Payer: Self-pay

## 2021-09-20 ENCOUNTER — Ambulatory Visit: Payer: Medicaid Other | Admitting: Gerontology

## 2021-10-04 ENCOUNTER — Ambulatory Visit: Payer: Medicaid Other | Admitting: Physician Assistant

## 2021-10-04 ENCOUNTER — Other Ambulatory Visit: Payer: Self-pay

## 2021-10-04 ENCOUNTER — Ambulatory Visit (INDEPENDENT_AMBULATORY_CARE_PROVIDER_SITE_OTHER): Payer: Self-pay | Admitting: Urology

## 2021-10-04 ENCOUNTER — Encounter: Payer: Self-pay | Admitting: Urology

## 2021-10-04 VITALS — BP 110/72 | HR 94 | Ht 70.0 in | Wt 255.0 lb

## 2021-10-04 DIAGNOSIS — N529 Male erectile dysfunction, unspecified: Secondary | ICD-10-CM

## 2021-10-04 MED ORDER — TADALAFIL 5 MG PO TABS
10.0000 mg | ORAL_TABLET | Freq: Every day | ORAL | 11 refills | Status: DC | PRN
Start: 2021-10-04 — End: 2023-03-27
  Filled 2021-10-04 – 2021-10-23 (×2): qty 30, 7d supply, fill #0
  Filled 2021-10-24: qty 30, 30d supply, fill #0
  Filled 2021-11-27 – 2021-12-13 (×2): qty 30, 30d supply, fill #1
  Filled 2022-02-24: qty 12, 3d supply, fill #2
  Filled 2022-03-29: qty 12, 3d supply, fill #3
  Filled 2022-03-30: qty 12, 3d supply, fill #0
  Filled 2022-04-06: qty 6, 30d supply, fill #0
  Filled 2022-05-21: qty 6, 30d supply, fill #1
  Filled 2022-06-03: qty 30, 30d supply, fill #2

## 2021-10-04 NOTE — Progress Notes (Signed)
° °  10/04/2021 2:05 PM   Brian Rios Jul 08, 1981 [id number]  Reason for visit: Follow up ED  HPI: 40 year old comorbid male with history of depression, alcohol abuse, drug abuse, tobacco abuse, obesity, who reports at least 6 to 7 months of trouble with erections.  At our last visit in November he opted for trial of 5 mg Cialis daily, and deferred further evaluation with a testosterone value.  He reports only mild improvement in the erections on the 5 mg Cialis daily.  I recommended increasing this to 10 to 20 mg, and risks and benefits discussed.  We also discussed that sexual activity is required for this medication to work, and I again recommended considering a testosterone.  He again defers testosterone lab today, but may be amenable in the future if persistent ED despite increased dose of Cialis.  RTC 4 to 6 weeks symptom check, consider changing to 100 mg sildenafil at that time if persistent symptoms and/or checking a morning testosterone  Billey Co, MD  [place] [address] [place], [place] [address] [phone]

## 2021-10-05 ENCOUNTER — Ambulatory Visit: Payer: Medicaid Other | Admitting: Physician Assistant

## 2021-10-23 ENCOUNTER — Other Ambulatory Visit: Payer: Self-pay | Admitting: Gerontology

## 2021-10-23 DIAGNOSIS — Z8639 Personal history of other endocrine, nutritional and metabolic disease: Secondary | ICD-10-CM

## 2021-10-23 MED ORDER — ATORVASTATIN CALCIUM 40 MG PO TABS
ORAL_TABLET | Freq: Every day | ORAL | 0 refills | Status: DC
  Filled 2021-10-23: qty 30, 30d supply, fill #0

## 2021-10-24 ENCOUNTER — Other Ambulatory Visit: Payer: Self-pay

## 2021-10-26 ENCOUNTER — Ambulatory Visit: Payer: Medicaid Other | Admitting: Gerontology

## 2021-11-02 ENCOUNTER — Encounter: Payer: Self-pay | Admitting: Gerontology

## 2021-11-02 ENCOUNTER — Other Ambulatory Visit: Payer: Self-pay

## 2021-11-02 ENCOUNTER — Ambulatory Visit: Payer: Medicaid Other | Admitting: Gerontology

## 2021-11-02 VITALS — BP 119/80 | HR 94 | Temp 97.5°F | Resp 16 | Ht 70.0 in | Wt 252.2 lb

## 2021-11-02 DIAGNOSIS — R7303 Prediabetes: Secondary | ICD-10-CM

## 2021-11-02 DIAGNOSIS — F17200 Nicotine dependence, unspecified, uncomplicated: Secondary | ICD-10-CM

## 2021-11-02 DIAGNOSIS — Z8639 Personal history of other endocrine, nutritional and metabolic disease: Secondary | ICD-10-CM

## 2021-11-02 DIAGNOSIS — Z0000 Encounter for general adult medical examination without abnormal findings: Secondary | ICD-10-CM

## 2021-11-02 MED ORDER — METFORMIN HCL 1000 MG PO TABS
500.0000 mg | ORAL_TABLET | Freq: Every day | ORAL | 3 refills | Status: DC
  Filled 2021-11-02: qty 30, 60d supply, fill #0

## 2021-11-02 MED ORDER — ATORVASTATIN CALCIUM 40 MG PO TABS
ORAL_TABLET | Freq: Every day | ORAL | 0 refills | Status: DC
  Filled 2021-11-02: qty 30, fill #0
  Filled 2021-11-27: qty 30, 30d supply, fill #0

## 2021-11-02 NOTE — Progress Notes (Signed)
Established Patient Office Visit  Subjective:  Patient ID: Brian Rios, male    DOB: 1981-07-13  Age: 41 y.o. MRN: [id number]  CC:  Chief Complaint  Patient presents with   Follow-up    HPI Brian Rios is a 41 y/o male who has history of Alcohol abuse, Depression, Drug abuse, CVA presents for routine follow up. He states that he's compliant with his medications, denies side effects and continue to work on his diet. He  continues to smoke 1 pack of cigarette daily and admits the desire to quit. He states that his mood is dysphorics, denies suicidal/ homicidal ideation and follows up at [facility]. Overall, he states that he's doing well and offers no further complaint.    Past Medical History:  Diagnosis Date   Alcohol abuse    Depression    Drug abuse ([facility])    Tobacco abuse     Past Surgical History:  Procedure Laterality Date   ORIF ORBITAL FRACTURE     TEE WITHOUT CARDIOVERSION N/A 07/19/2020   Procedure: TRANSESOPHAGEAL ECHOCARDIOGRAM (TEE);  Surgeon: Lamar Blinks, MD;  Location: [facility] ORS;  Service: Cardiovascular;  Laterality: N/A;    Family History  Problem Relation Age of Onset   Alcoholism Mother    Hypertension Mother    Hyperlipidemia Mother    Breast cancer Mother    Alcoholism Father    Diabetes Maternal Grandmother     Social History   Socioeconomic History   Marital status: Single    Spouse name: Not on file   Number of children: Not on file   Years of education: Not on file   Highest education level: Not on file  Occupational History   Not on file  Tobacco Use   Smoking status: Every Day    Packs/day: 1.00    Years: 15.00    Pack years: 15.00    Types: Cigarettes   Smokeless tobacco: Former    Quit date: 03/09/2003  Vaping Use   Vaping Use: Never used  Substance and Sexual Activity   Alcohol use: Yes    Alcohol/week: 7.0 standard drinks    Types: 7 Cans of beer per week   Drug use: Not Currently    Types: "Crack" cocaine     Comment: last use 2016   Sexual activity: Yes    Birth control/protection: None  Other Topics Concern   Not on file  Social History Narrative   Not on file   Social Determinants of Health   Financial Resource Strain: Not on file  Food Insecurity: No Food Insecurity   Worried About Running Out of Food in the Last Year: Never true   Ran Out of Food in the Last Year: Never true  Transportation Needs: No Transportation Needs   Lack of Transportation (Medical): No   Lack of Transportation (Non-Medical): No  Physical Activity: Not on file  Stress: Not on file  Social Connections: Not on file  Intimate Partner Violence: Not on file    Outpatient Medications Prior to Visit  Medication Sig Dispense Refill   ARIPiprazole (ABILIFY) 30 MG tablet TAKE ONE TABLET BY MOUTH ONCE DAILY AT BEDTIME. 30 tablet 2   ARIPiprazole ER (ABILIFY MAINTENA) 400 MG PRSY prefilled syringe Inject 400 mg into the muscle every 30 (thirty) days. Last ~2 weeks ago (noted 1/12)     benztropine (COGENTIN) 1 MG tablet TAKE ONE TABLET BY MOUTH ONCE DAILY AT BEDTIME. 30 tablet 2   divalproex (DEPAKOTE ER) 250 MG 24  hr tablet TAKE 3 TABLETS ($RemoveBe'750MG'[id number]$  TOTAL) BY MOUTH ONCE DAILY AT BEDTIME. 90 tablet 2   escitalopram (LEXAPRO) 20 MG tablet TAKE ONE TABLET BY MOUTH ONCE DAILY. 30 tablet 2   hydrOXYzine (ATARAX) 25 MG tablet TAKE 3 TABLETS ($RemoveBe'75MG'HNvRDGTke$  TOTAL) BY MOUTH ONCE DAILY AT BEDTIME AS NEEDED. (Patient taking differently: Take by mouth. Taking once a day at night) 90 tablet 2   mirtazapine (REMERON) 45 MG tablet TAKE ONE TABLET BY MOUTH ONCE DAILY AT BEDTIME. 30 tablet 2   tadalafil (CIALIS) 5 MG tablet Take 2-4 tablets (10-20 mg total) by mouth once daily as needed for erectile dysfunction. (Patient taking differently: Take 10-20 mg by mouth daily as needed for erectile dysfunction. Once a day) 30 tablet 11   valbenazine (INGREZZA) 80 MG capsule TAKE ONE CAPSULE BY MOUTH AT BEDTIME (Patient taking differently: Take by mouth at  bedtime. Reports taking but patient has never filled this medication at [facility].) 30 capsule 3   atorvastatin (LIPITOR) 40 MG tablet TAKE ONE TABLET BY MOUTH EVERY DAY 30 tablet 0   metFORMIN (GLUCOPHAGE) 1000 MG tablet Take (1/2) tablet (500 mg total) by mouth once daily with breakfast. 30 tablet 3   folic acid (FOLVITE) 1 MG tablet TAKE ONE TABLET BY MOUTH EVERY DAY (Patient not taking: Reported on 11/02/2021) 30 tablet 3   thiamine (VITAMIN B-1) 100 MG tablet TAKE ONE TABLET BY MOUTH EVERY DAY (Patient not taking: Reported on 11/02/2021) 30 tablet 3   No facility-administered medications prior to visit.    No Known Allergies  ROS Review of Systems  Constitutional: Negative.   Respiratory: Negative.    Cardiovascular: Negative.   Gastrointestinal: Negative.   Endocrine: Negative.   Neurological: Negative.   Psychiatric/Behavioral:  Positive for dysphoric mood.      Objective:    Physical Exam HENT:     Head: Normocephalic and atraumatic.  Eyes:     Extraocular Movements: Extraocular movements intact.     Conjunctiva/sclera: Conjunctivae normal.     Pupils: Pupils are equal, round, and reactive to light.  Cardiovascular:     Rate and Rhythm: Normal rate and regular rhythm.     Pulses: Normal pulses.     Heart sounds: Normal heart sounds.  Pulmonary:     Effort: Pulmonary effort is normal.     Breath sounds: Normal breath sounds.  Skin:    General: Skin is warm.  Neurological:     General: No focal deficit present.     Mental Status: He is alert and oriented to person, place, and time. Mental status is at baseline.  Psychiatric:        Mood and Affect: Mood normal.        Behavior: Behavior normal.        Thought Content: Thought content normal.        Judgment: Judgment normal.    BP 119/80 (BP Location: Right Arm, Patient Position: Sitting, Cuff Size: Large)    Pulse 94    Temp (!) 97.5 F (36.4 C) (Oral)    Resp 16    Ht $R'5\' 10"'Ga$  (1.778 m)    Wt 252 lb 3.2 oz (114.4 kg)     SpO2 95%    BMI 36.19 kg/m  Wt Readings from Last 3 Encounters:  11/02/21 252 lb 3.2 oz (114.4 kg)  10/04/21 255 lb (115.7 kg)  08/23/21 255 lb (115.7 kg)   Encouraged weight loss  Health Maintenance Due  Topic Date Due   COVID-19  Vaccine (1) Never done   Pneumococcal Vaccine 16-45 Years old (1 - PCV) Never done   Hepatitis C Screening  Never done    There are no preventive care reminders to display for this patient.  Lab Results  Component Value Date   TSH 4.040 03/28/2021   Lab Results  Component Value Date   WBC 10.9 (H) 03/28/2021   HGB 15.0 03/28/2021   HCT 45.0 03/28/2021   MCV 82 03/28/2021   PLT 246 03/28/2021   Lab Results  Component Value Date   NA 141 03/28/2021   K 4.4 03/28/2021   CO2 21 03/28/2021   GLUCOSE 85 03/28/2021   BUN 6 03/28/2021   CREATININE 0.96 03/28/2021   BILITOT <0.2 03/28/2021   ALKPHOS 58 03/28/2021   AST 14 03/28/2021   ALT 13 03/28/2021   PROT 5.6 (L) 03/28/2021   ALBUMIN 3.6 (L) 03/28/2021   CALCIUM 8.8 03/28/2021   ANIONGAP 10 07/17/2020   EGFR 102 03/28/2021   Lab Results  Component Value Date   CHOL 144 03/28/2021   Lab Results  Component Value Date   HDL 48 03/28/2021   Lab Results  Component Value Date   LDLCALC 80 03/28/2021   Lab Results  Component Value Date   TRIG 83 03/28/2021   Lab Results  Component Value Date   CHOLHDL 3.0 03/28/2021   Lab Results  Component Value Date   HGBA1C 6.0 (H) 03/28/2021      Assessment & Plan:    1. H/O elevated lipids - He will continue on current medication, low fat/cholesterol diet and exercise as tolerated. - atorvastatin (LIPITOR) 40 MG tablet; TAKE ONE TABLET BY MOUTH EVERY DAY  Dispense: 30 tablet; Refill: 0  2. Prediabetes - He will continue on current medication, will recheck HgbA1c prior to next visit. He was encouraged to continue on low carb/non concentrated sweet diet and exercise as tolerated. - metFORMIN (GLUCOPHAGE) 1000 MG tablet; Take (1/2)  tablet (500 mg total) by mouth once daily with breakfast.  Dispense: 30 tablet; Refill: 3 - HgB A1c; Future  3. Tobacco use disorder - He was encouraged on smoking cessation, provided [facility] Quit line information and advised to call.  4. Health care maintenance - Routine labs will be checked. - HgB A1c; Future - Lipid panel; Future - Comp Met (CMET); Future - CBC w/Diff; Future    Follow-up: Return in about 4 months (around 02/21/2022), or if symptoms worsen or fail to improve.    Tylee Newby Jerold Coombe, NP

## 2021-11-02 NOTE — Patient Instructions (Signed)
Smoking Tobacco Information, Adult Smoking tobacco can be harmful to your health. Tobacco contains a poisonous (toxic), colorless chemical called nicotine. Nicotine is addictive. It changes the brain and can make it hard to stop smoking. Tobacco also has other toxic chemicals that can hurt your body and raise your risk of many cancers. How can smoking tobacco affect me? Smoking tobacco puts you at risk for: Cancer. Smoking is most commonly associated with lung cancer, but can also lead to cancer in other parts of the body. Chronic obstructive pulmonary disease (COPD). This is a long-term lung condition that makes it hard to breathe. It also gets worse over time. High blood pressure (hypertension), heart disease, stroke, or heart attack. Lung infections, such as pneumonia. Cataracts. This is when the lenses in the eyes become clouded. Digestive problems. This may include peptic ulcers, heartburn, and gastroesophageal reflux disease (GERD). Oral health problems, such as gum disease and tooth loss. Loss of taste and smell. Smoking can affect your appearance by causing: Wrinkles. Yellow or stained teeth, fingers, and fingernails. Smoking tobacco can also affect your social life, because: It may be challenging to find places to smoke when away from home. Many workplaces, [employer], hotels, and public places are tobacco-free. Smoking is expensive. This is due to the cost of tobacco and the long-term costs of treating health problems from smoking. Secondhand smoke may affect those around you. Secondhand smoke can cause lung cancer, breathing problems, and heart disease. Children of smokers have a higher risk for: Sudden infant death syndrome (SIDS). Ear infections. Lung infections. If you currently smoke tobacco, quitting now can help you: Lead a longer and healthier life. Look, smell, breathe, and feel better over time. Save money. Protect others from the harms of secondhand smoke. What  actions can I take to prevent health problems? Quit smoking  Do not start smoking. Quit if you already do. Make a plan to quit smoking and commit to it. Look for programs to help you and ask your health care provider for recommendations and ideas. Set a date and write down all the reasons you want to quit. Let your friends and family know you are quitting so they can help and support you. Consider finding friends who also want to quit. It can be easier to quit with someone else, so that you can support each other. Talk with your health care provider about using nicotine replacement medicines to help you quit, such as gum, lozenges, patches, sprays, or pills. Do not replace cigarette smoking with electronic cigarettes, which are commonly called e-cigarettes. The safety of e-cigarettes is not known, and some may contain harmful chemicals. If you try to quit but return to smoking, stay positive. It is common to slip up when you first quit, so take it one day at a time. Be prepared for cravings. When you feel the urge to smoke, chew gum or suck on hard candy. Lifestyle Stay busy and take care of your body. Drink enough fluid to keep your urine pale yellow. Get plenty of exercise and eat a healthy diet. This can help prevent weight gain after quitting. Monitor your eating habits. Quitting smoking can cause you to have a larger appetite than when you smoke. Find ways to relax. Go out with friends or family to a movie or a restaurant where people do not smoke. Ask your health care provider about having regular tests (screenings) to check for cancer. This may include blood tests, imaging tests, and other tests. Find ways to manage  your stress, such as meditation, yoga, or exercise. Where to find support To get support to quit smoking, consider: Asking your health care provider for more information and resources. Taking classes to learn more about quitting smoking. Looking for local organizations that  offer resources about quitting smoking. Joining a support group for people who want to quit smoking in your local community. Calling the smokefree.gov counselor helpline: 1-800-Quit-Now (971) 240-7438) Where to find more information You may find more information about quitting smoking from: HelpGuide.org: www.helpguide.org https://hall.com/: smokefree.gov American Lung Association: www.lung.org Contact a health care provider if you: Have problems breathing. Notice that your lips, nose, or fingers turn blue. Have chest pain. Are coughing up blood. Feel faint or you pass out. Have other health changes that cause you to worry. Summary Smoking tobacco can negatively affect your health, the health of those around you, your finances, and your social life. Do not start smoking. Quit if you already do. If you need help quitting, ask your health care provider. Think about joining a support group for people who want to quit smoking in your local community. There are many effective programs that will help you to quit this behavior. This information is not intended to replace advice given to you by your health care provider. Make sure you discuss any questions you have with your health care provider. Document Revised: 06/12/2021 Document Reviewed: 08/30/2020 Elsevier Patient Education  [place] Many factors influence your heart (coronary) health, including eating and exercise habits. Coronary risk increases with abnormal blood fat (lipid) levels. Heart-healthy meal planning includes limiting unhealthy fats, increasing healthy fats, and making other diet and lifestyle changes. What is my plan? Your health care provider may recommend that you: Limit your fat intake to _________% or less of your total calories each day. Limit your saturated fat intake to _________% or less of your total calories each day. Limit the amount of cholesterol in your diet to less than  _________ mg per day. What are tips for following this plan? Cooking Cook foods using methods other than frying. Baking, boiling, grilling, and broiling are all good options. Other ways to reduce fat include: Removing the skin from poultry. Removing all visible fats from meats. Steaming vegetables in water or broth. Meal planning  At meals, imagine dividing your plate into fourths: Fill one-half of your plate with vegetables and green salads. Fill one-fourth of your plate with whole grains. Fill one-fourth of your plate with lean protein foods. Eat 4-5 servings of vegetables per day. One serving equals 1 cup raw or cooked vegetable, or 2 cups raw leafy greens. Eat 4-5 servings of fruit per day. One serving equals 1 medium whole fruit,  cup dried fruit,  cup fresh, frozen, or canned fruit, or  cup 100% fruit juice. Eat more foods that contain soluble fiber. Examples include apples, broccoli, carrots, beans, peas, and barley. Aim to get 25-30 g of fiber per day. Increase your consumption of legumes, nuts, and seeds to 4-5 servings per week. One serving of dried beans or legumes equals  cup cooked, 1 serving of nuts is  cup, and 1 serving of seeds equals 1 tablespoon. Fats Choose healthy fats more often. Choose monounsaturated and polyunsaturated fats, such as olive and canola oils, flaxseeds, walnuts, almonds, and seeds. Eat more omega-3 fats. Choose salmon, mackerel, sardines, tuna, flaxseed oil, and ground flaxseeds. Aim to eat fish at least 2 times each week. Check food labels carefully to identify foods with trans fats  or high amounts of saturated fat. Limit saturated fats. These are found in animal products, such as meats, butter, and cream. Plant sources of saturated fats include palm oil, palm kernel oil, and coconut oil. Avoid foods with partially hydrogenated oils in them. These contain trans fats. Examples are stick margarine, some tub margarines, cookies, crackers, and other baked  goods. Avoid fried foods. General information Eat more home-cooked food and less restaurant, buffet, and fast food. Limit or avoid alcohol. Limit foods that are high in starch and sugar. Lose weight if you are overweight. Losing just 5-10% of your body weight can help your overall health and prevent diseases such as diabetes and heart disease. Monitor your salt (sodium) intake, especially if you have high blood pressure. Talk with your health care provider about your sodium intake. Try to incorporate more vegetarian meals weekly. What foods can I eat? Fruits All fresh, canned (in natural juice), or frozen fruits. Vegetables Fresh or frozen vegetables (raw, steamed, roasted, or grilled). Green salads. Grains Most grains. Choose whole wheat and whole grains most of the time. Rice and pasta, including brown rice and pastas made with whole wheat. Meats and other proteins Lean, well-trimmed beef, veal, pork, and lamb. Chicken and [place] without skin. All fish and shellfish. Wild duck, rabbit, pheasant, and venison. Egg whites or low-cholesterol egg substitutes. Dried beans, peas, lentils, and tofu. Seeds and most nuts. Dairy Low-fat or nonfat cheeses, including ricotta and mozzarella. Skim or 1% milk (liquid, powdered, or evaporated). Buttermilk made with low-fat milk. Nonfat or low-fat yogurt. Fats and oils Non-hydrogenated (trans-free) margarines. Vegetable oils, including soybean, sesame, sunflower, olive, peanut, safflower, corn, canola, and cottonseed. Salad dressings or mayonnaise made with a vegetable oil. Beverages Water (mineral or sparkling). Coffee and tea. Diet carbonated beverages. Sweets and desserts Sherbet, gelatin, and fruit ice. Small amounts of dark chocolate. Limit all sweets and desserts. Seasonings and condiments All seasonings and condiments. The items listed above may not be a complete list of foods and beverages you can eat. Contact a dietitian for more options. What  foods are not recommended? Fruits Canned fruit in heavy syrup. Fruit in cream or butter sauce. Fried fruit. Limit coconut. Vegetables Vegetables cooked in cheese, cream, or butter sauce. Fried vegetables. Grains Breads made with saturated or trans fats, oils, or whole milk. Croissants. Sweet rolls. Donuts. High-fat crackers, such as cheese crackers. Meats and other proteins Fatty meats, such as hot dogs, ribs, sausage, bacon, rib-eye roast or steak. High-fat deli meats, such as salami and bologna. Caviar. Domestic duck and goose. Organ meats, such as liver. Dairy Cream, sour cream, cream cheese, and creamed cottage cheese. Whole-milk cheeses. Whole or 2% milk (liquid, evaporated, or condensed). Whole buttermilk. Cream sauce or high-fat cheese sauce. Whole-milk yogurt. Fats and oils Meat fat, or shortening. Cocoa butter, hydrogenated oils, palm oil, coconut oil, palm kernel oil. Solid fats and shortenings, including bacon fat, salt pork, lard, and butter. Nondairy cream substitutes. Salad dressings with cheese or sour cream. Beverages Regular sodas and any drinks with added sugar. Sweets and desserts Frosting. Pudding. Cookies. Cakes. Pies. Milk chocolate or white chocolate. Buttered syrups. Full-fat ice cream or ice cream drinks. The items listed above may not be a complete list of foods and beverages to avoid. Contact a dietitian for more information. Summary Heart-healthy meal planning includes limiting unhealthy fats, increasing healthy fats, and making other diet and lifestyle changes. Lose weight if you are overweight. Losing just 5-10% of your body weight can help your  overall health and prevent diseases such as diabetes and heart disease. Focus on eating a balance of foods, including fruits and vegetables, low-fat or nonfat dairy, lean protein, nuts and legumes, whole grains, and heart-healthy oils and fats. This information is not intended to replace advice given to you by your health  care provider. Make sure you discuss any questions you have with your health care provider. Document Revised: 02/16/2021 Document Reviewed: 02/16/2021 Elsevier Patient Education  2022 [place] for Diabetes Mellitus, Adult Carbohydrate counting is a method of keeping track of how many carbohydrates you eat. Eating carbohydrates increases the amount of sugar (glucose) in the blood. Counting how many carbohydrates you eat improves how well you manage your blood glucose. This, in turn, helps you manage your diabetes. Carbohydrates are measured in grams (g) per serving. It is important to know how many carbohydrates (in grams or by serving size) you can have in each meal. This is different for every person. A dietitian can help you make a meal plan and calculate how many carbohydrates you should have at each meal and snack. What foods contain carbohydrates? Carbohydrates are found in the following foods: Grains, such as breads and cereals. Dried beans and soy products. Starchy vegetables, such as potatoes, peas, and corn. Fruit and fruit juices. Milk and yogurt. Sweets and snack foods, such as cake, cookies, candy, chips, and soft drinks. How do I count carbohydrates in foods? There are two ways to count carbohydrates in food. You can read food labels or learn standard serving sizes of foods. You can use either of these methods or a combination of both. Using the Nutrition Facts label The Nutrition Facts list is included on the labels of almost all packaged foods and beverages in the [place]. It includes: The serving size. Information about nutrients in each serving, including the grams of carbohydrate per serving. To use the Nutrition Facts, decide how many servings you will have. Then, multiply the number of servings by the number of carbohydrates per serving. The resulting number is the total grams of carbohydrates that you will be having. Learning the standard  serving sizes of foods When you eat carbohydrate foods that are not packaged or do not include Nutrition Facts on the label, you need to measure the servings in order to count the grams of carbohydrates. Measure the foods that you will eat with a food scale or measuring cup, if needed. Decide how many standard-size servings you will eat. Multiply the number of servings by 15. For foods that contain carbohydrates, one serving equals 15 g of carbohydrates. For example, if you eat 2 cups or 10 oz (300 g) of strawberries, you will have eaten 2 servings and 30 g of carbohydrates (2 servings x 15 g = 30 g). For foods that have more than one food mixed, such as soups and casseroles, you must count the carbohydrates in each food that is included. The following list contains standard serving sizes of common carbohydrate-rich foods. Each of these servings has about 15 g of carbohydrates: 1 slice of bread. 1 six-inch (15 cm) tortilla. ? cup or 2 oz (53 g) cooked rice or pasta.  cup or 3 oz (85 g) cooked or canned, drained and rinsed beans or lentils.  cup or 3 oz (85 g) starchy vegetable, such as peas, corn, or squash.  cup or 4 oz (120 g) hot cereal.  cup or 3 oz (85 g) boiled or mashed potatoes, or  or 3 oz (  85 g) of a large baked potato.  cup or 4 fl oz (118 mL) fruit juice. 1 cup or 8 fl oz (237 mL) milk. 1 small or 4 oz (106 g) apple.  or 2 oz (63 g) of a medium banana. 1 cup or 5 oz (150 g) strawberries. 3 cups or 1 oz (28.3 g) popped popcorn. What is an example of carbohydrate counting? To calculate the grams of carbohydrates in this sample meal, follow the steps shown below. Sample meal 3 oz (85 g) chicken breast. ? cup or 4 oz (106 g) brown rice.  cup or 3 oz (85 g) corn. 1 cup or 8 fl oz (237 mL) milk. 1 cup or 5 oz (150 g) strawberries with sugar-free whipped topping. Carbohydrate calculation Identify the foods that contain  carbohydrates: Rice. Corn. Milk. Strawberries. Calculate how many servings you have of each food: 2 servings rice. 1 serving corn. 1 serving milk. 1 serving strawberries. Multiply each number of servings by 15 g: 2 servings rice x 15 g = 30 g. 1 serving corn x 15 g = 15 g. 1 serving milk x 15 g = 15 g. 1 serving strawberries x 15 g = 15 g. Add together all of the amounts to find the total grams of carbohydrates eaten: 30 g + 15 g + 15 g + 15 g = 75 g of carbohydrates total. What are tips for following this plan? Shopping Develop a meal plan and then make a shopping list. Buy fresh and frozen vegetables, fresh and frozen fruit, dairy, eggs, beans, lentils, and whole grains. Look at food labels. Choose foods that have more fiber and less sugar. Avoid processed foods and foods with added sugars. Meal planning Aim to have the same number of grams of carbohydrates at each meal and for each snack time. Plan to have regular, balanced meals and snacks. Where to find more information American Diabetes Association: diabetes.org Centers for Disease Control and Prevention: StoreMirror.com.[place] Academy of Nutrition and Dietetics: eatright.org Association of Diabetes Care & Education Specialists: diabeteseducator.org Summary Carbohydrate counting is a method of keeping track of how many carbohydrates you eat. Eating carbohydrates increases the amount of sugar (glucose) in your blood. Counting how many carbohydrates you eat improves how well you manage your blood glucose. This helps you manage your diabetes. A dietitian can help you make a meal plan and calculate how many carbohydrates you should have at each meal and snack. This information is not intended to replace advice given to you by your health care provider. Make sure you discuss any questions you have with your health care provider. Document Revised: 05/11/2020 Document Reviewed: 05/11/2020 Elsevier Patient Education  [place].

## 2021-11-14 NOTE — Progress Notes (Signed)
11/15/2021 9:55 AM   Brian Rios 08/09/1981 [id number]  Referring provider: Langston Reusing, NP [address] [place],  [place] [address]  Chief Complaint  Patient presents with   Erectile Dysfunction   Urological history: 1. ED -contributing factors of age, alcohol abuse, drug abuse, smoking, stroke, depression, HTN and HLD -SHIM 11 -managed with tadalafil 20 mg, on-demand-dosing  HPI: Brian Rios is a 41 y.o. male who presents today for a one month follow up after a trial of tadalafil 20 mg, on-demand-dosing.  Upon reviewing his records for today's appointment, he underwent a CT scan at [facility] on 07/17/2021 which noted a soft tissue mass is epicentered within the right lower quadrant small bowel mesentery measuring approximately 4.2 x 3.6 cm with subjacent thickened small bowel loop with nodularity that measures 2.3 cm (2:95). 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 Additional mesenteric implants are noted. For reference: 1.8 cm mesenteric nodule left upper quadrant small bowel mesentery (2:60). Central mesenteric nodule measures 2.2 x 1.1 cm (2:86).  This reading was addended to the original CT report.  Attempts were made to reach the patient, but he could not be contacted.   Patient is not having spontaneous erections.  He denies any pain or curvature with erections.  He states that the tadalafil 20 mg was not effective in helping with erections.   SHIM     Row Name 11/15/21 0929         SHIM: Over the last 6 months:   How do you rate your confidence that you could get and keep an erection? Very Low     When you had erections with sexual stimulation, how often were your erections hard enough for penetration (entering your partner)? Almost Never or Never     During sexual intercourse, how often were you able to maintain your erection after you had penetrated (entered) your partner? A Few Times (much less than half  the time)     During sexual intercourse, how difficult was it to maintain your erection to completion of intercourse? Very Difficult     When you attempted sexual intercourse, how often was it satisfactory for you? Almost Always or Always       SHIM Total Score   SHIM 11              Score: 1-7 Severe ED 8-11 Moderate ED 12-16 Mild-Moderate ED 17-21 Mild ED 22-25 No ED       PMH: Past Medical History:  Diagnosis Date   Alcohol abuse    Depression    Drug abuse ([place])    Tobacco abuse     Surgical History: Past Surgical History:  Procedure Laterality Date   ORIF ORBITAL FRACTURE     TEE WITHOUT CARDIOVERSION N/A 07/19/2020   Procedure: TRANSESOPHAGEAL ECHOCARDIOGRAM (TEE);  Surgeon: Corey Skains, MD;  Location: [facility] ORS;  Service: Cardiovascular;  Laterality: N/A;    Home Medications:  Allergies as of 11/15/2021   No Known Allergies      Medication List        Accurate as of November 15, 2021  9:55 AM. If you have any questions, ask your nurse or doctor.          ARIPiprazole ER 400 MG Prsy prefilled syringe Commonly known as: ABILIFY MAINTENA Inject 400 mg into the muscle every 30 (thirty) days. Last ~2 weeks ago (noted 1/12)   ARIPiprazole 30 MG tablet Commonly known as: ABILIFY TAKE ONE  TABLET BY MOUTH ONCE DAILY AT BEDTIME.   atorvastatin 40 MG tablet Commonly known as: LIPITOR TAKE ONE TABLET BY MOUTH EVERY DAY   benztropine 1 MG tablet Commonly known as: COGENTIN TAKE ONE TABLET BY MOUTH ONCE DAILY AT BEDTIME.   divalproex 250 MG 24 hr tablet Commonly known as: DEPAKOTE ER TAKE 3 TABLETS (750MG TOTAL) BY MOUTH ONCE DAILY AT BEDTIME.   escitalopram 20 MG tablet Commonly known as: LEXAPRO TAKE ONE TABLET BY MOUTH ONCE DAILY.   folic acid 1 MG tablet Commonly known as: FOLVITE TAKE ONE TABLET BY MOUTH EVERY DAY   hydrOXYzine 25 MG tablet Commonly known as: ATARAX TAKE 3 TABLETS (75MG TOTAL) BY MOUTH ONCE DAILY AT BEDTIME AS  NEEDED. What changed: additional instructions   Ingrezza 80 MG capsule Generic drug: valbenazine TAKE ONE CAPSULE BY MOUTH AT BEDTIME What changed: additional instructions   metFORMIN 1000 MG tablet Commonly known as: GLUCOPHAGE Take (1/2) tablet (500 mg total) by mouth once daily with breakfast.   mirtazapine 45 MG tablet Commonly known as: REMERON TAKE ONE TABLET BY MOUTH ONCE DAILY AT BEDTIME.   tadalafil 5 MG tablet Commonly known as: CIALIS Take 2-4 tablets (10-20 mg total) by mouth once daily as needed for erectile dysfunction. What changed: additional instructions   thiamine 100 MG tablet Commonly known as: VITAMIN B-1 TAKE ONE TABLET BY MOUTH EVERY DAY        Allergies: No Known Allergies  Family History: Family History  Problem Relation Age of Onset   Alcoholism Mother    Hypertension Mother    Hyperlipidemia Mother    Breast cancer Mother    Alcoholism Father    Diabetes Maternal Grandmother     Social History:  reports that he has been smoking cigarettes. He has a 15.00 pack-year smoking history. He quit smokeless tobacco use about 18 years ago. He reports current alcohol use of about 7.0 standard drinks per week. He reports that he does not currently use drugs after having used the following drugs: "Crack" cocaine.  ROS: Pertinent ROS in HPI  Physical Exam: BP 123/72    Pulse 96    Ht _0  (1.778 m)    Wt 255 lb (115.7 kg)    BMI 36.59 kg/m   Constitutional:  Well nourished. Alert and oriented, No acute distress. HEENT: [place] AT, mask in place.  Trachea midline Cardiovascular: No clubbing, cyanosis, or edema. Respiratory: Normal respiratory effort, no increased work of breathing. Neurologic: Grossly intact, no focal deficits, moving all 4 extremities. Psychiatric: Normal mood and affect.  Laboratory Data: WBC 3.6 - 11.2 10*9/L 11.2   RBC 4.26 - 5.60 10*12/L 5.03   HGB 12.9 - 16.5 g/dL 13.8   HCT 39.0 - 48.0 % 41.1   MCV 77.6 - 95.7 fL 81.8    MCH 25.9 - 32.4 pg 27.5   MCHC 32.0 - 36.0 g/dL 33.6   RDW 12.2 - 15.2 % 13.8   MPV 6.8 - 10.7 fL 8.4   Platelet 150 - 450 10*9/L 230   nRBC <=4 /100 WBCs 0   Neutrophils % % 72.4   Lymphocytes % % 14.4   Monocytes % % 11.4   Eosinophils % % 0.9   Basophils % % 0.9   Absolute Neutrophils 1.8 - 7.8 10*9/L 8.1 High    Absolute Lymphocytes 1.1 - 3.6 10*9/L 1.6   Absolute Monocytes 0.3 - 0.8 10*9/L 1.3 High    Absolute Eosinophils 0.0 - 0.5 10*9/L 0.1  Absolute Basophils 0.0 - 0.1 10*9/L 0.1   Resulting Agency  [facility]  Specimen Collected: 07/17/21 00:30 Last Resulted: 07/17/21 00:40  Received From: [facility]  Result Received: 09/19/21 09:14   Uric Acid 3.7 - 9.2 mg/dL 6.0   Resulting Agency  [facility]  Specimen Collected: 07/17/21 00:30 Last Resulted: 07/17/21 00:59  Received From: [place]  Result Received: 09/19/21 09:14   Sodium 135 - 145 mmol/L 142   Potassium 3.4 - 4.8 mmol/L 3.7   Chloride 98 - 107 mmol/L 109 High    CO2 20.0 - 31.0 mmol/L 24.0   Anion Gap 5 - 14 mmol/L 9   BUN 9 - 23 mg/dL 9   Creatinine 0.60 - 1.10 mg/dL 0.89   BUN/Creatinine Ratio  10   eGFR CKD-EPI (2021) Male >=60 mL/min/1.65m >90   Comment: eGFR calculated with CKD-EPI 2021 equation in accordance with [employer] Task Force recommendations.  Glucose 70 - 179 mg/dL 122   Calcium 8.7 - 10.4 mg/dL 9.4   Albumin 3.4 - 5.0 g/dL 3.0 Low    Total Protein 5.7 - 8.2 g/dL 6.4   Total Bilirubin 0.3 - 1.2 mg/dL 0.3   AST <=34 U/L 12   ALT 10 - 49 U/L 10   Alkaline Phosphatase 46 - 116 U/L 79   [facility]  Specimen Collected: 07/17/21 00:30 Last Resulted: 07/17/21 00:59  Received From: [facility] Result Received: 09/19/21 09:14   Color, UA  Yellow   Clarity, UA  Clear   Specific Gravity, UA 1.005 - 1.040 1.020   pH, UA 5.0 - 9.0 5.5   Leukocyte Esterase, UA  Negative Negative   Nitrite, UA Negative Negative   Protein, UA Negative Negative   Glucose, UA Negative Negative   Ketones, UA Negative Trace Abnormal    Urobilinogen, UA  0.2 mg/dL   Bilirubin, UA Negative Negative   Blood, UA Negative Negative   RBC, UA <3 /HPF <1   WBC, UA <2 /HPF <1   Squam Epithel, UA 0 - 5 /HPF <1   Bacteria, UA None Seen /HPF None Seen   [facility]  Specimen Collected: 07/17/21 00:22 Last Resulted: 07/17/21 00:33  Received From: [facility] Result Received: 09/19/21 09:14   Lab Results  Component Value Date   TSH 4.040 03/28/2021       Component Value Date/Time   CHOL 144 03/28/2021 1146   HDL 48 03/28/2021 1146   CHOLHDL 3.0 03/28/2021 1146   CHOLHDL 5.5 07/18/2020 0502   VLDL 27 07/18/2020 0502   LDLCALC 80 03/28/2021 1146    Lab Results  Component Value Date   AST 14 03/28/2021   Lab Results  Component Value Date   ALT 13 03/28/2021   Component     Latest Ref Rng & Units 03/28/2021  Hemoglobin A1C     4.8 - 5.6 % 6.0 (H)  I have reviewed the labs.   Pertinent Imaging: Impression  1.No evidence of nephroureterolithiasis, hydronephrosis or perinephric stranding as clinically questioned. Hypoattenuating focus in the upper pole of the left kidney with limited evaluation due to noncontrast exam. Given history of left flank pain, consider CT abdomen pelvis with contrast for further evaluation.  2.Soft tissue mass is epicentered within the right lower quadrant small bowel mesentery measuring approximately 4.2 x 3.6 cm with subjacent thickened small bowel loop with nodularity  that measures 2.3 cm (2:95). 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 Additional mesenteric implants are noted. For reference: 1.8 cm mesenteric nodule left upper quadrant small bowel mesentery (2:60). Central mesenteric nodule measures 2.2 x 1.1 cm (2:86)  3.Additional chronic and incidental findings,  as above.   ====================  ADDENDUM (07/17/2021 11:26 AM):  On review, the following additional findings were noted:   Further description of the mesenteric mass and mesenteric nodularity was made to the impression of the original report. Narrative  EXAM: CT ABDOMEN PELVIS WO CONTRAST  DATE: 07/17/2021 2:37 AM  ACCESSION: [id number]  DICTATED: 07/17/2021 3:09 AM  INTERPRETATION LOCATION: [place]   CLINICAL INDICATION: 41 years old with eval for left ureterolithiasis     COMPARISON: None.   TECHNIQUE: A spiral CT scan was obtained without IV contrast from the lung bases to the pubic symphysis.  Images were reconstructed in the axial plane. Coronal and sagittal reformatted images were also provided for further evaluation.   Evaluation of the solid organs and vasculature is limited in the absence of intravenous contrast.    FINDINGS:   LOWER CHEST: Left lower lobe atelectasis with surrounding groundglass opacity. Small left pleural effusion.   LIVER: Normal liver contour. No focal liver lesion on non-contrast examination.   BILIARY: No intrahepatic biliary ductal dilatation.  The gallbladder is normal in appearance   SPLEEN: Normal in size and contour. Small splenule.   PANCREAS: Normal pancreatic contour without signs of inflammation or gross ductal dilatation.   ADRENAL GLANDS: Normal appearance of the adrenal glands.   KIDNEYS/URETERS: Smooth renal contours.  No nephrolithiasis.  No ureteral dilatation or collecting system distention. There is a relative zone of hypoattenuation in the upper pole the left kidney (4:87, 2:32), though evaluation is significantly limited without IV contrast material. No significant perinephric stranding.   BLADDER: Unremarkable.   REPRODUCTIVE ORGANS: Unremarkable.   GI TRACT: Dense focus of soft tissue abutting/originating from multiple loops of small bowel within the mid lower abdomen with mild adjacent stranding (2:93). No  findings of bowel obstruction. Normal appendix.   PERITONEUM/RETROPERITONEUM AND MESENTERY: Soft tissue lesion as described above. No free air. No ascites. No fluid collection.   VASCULATURE: Normal caliber aorta. Otherwise, limited evaluation without contrast.   LYMPH NODES: No adenopathy.   BONES and SOFT TISSUES: No aggressive osseous lesions. No focal soft tissue lesions. Addendum  Addendum by Chandra Batch, DO on 07/18/2021  9:04 AM EDT  The findings of this study were discussed via telephone with DR. Suella Broad by Dr. Minus Liberty on 07/17/2021 8:15 PM. Procedure Note  Chandra Batch, DO - 07/17/2021  Formatting of this note might be different from the original.  EXAM: CT ABDOMEN PELVIS WO CONTRAST  DATE: 07/17/2021 2:37 AM  ACCESSION: [id number]  DICTATED: 07/17/2021 3:09 AM  INTERPRETATION LOCATION: [place]   Assessment & Plan:    1. ED -Failed PDE 5 inhibitors -Will evaluate for testosterone deficiency -Serum testosterone is drawn and I explained to the patient that if it returns below normal, we will need to repeat a second morning testosterone to confirm  2. Small bowel mass -explained to patient that the CT findings are concerning for malignancy and will refer onto GI for further evaluation and management   Return for follow up pending testosterone levels and refer to GI .  These notes generated with voice recognition software. I apologize for typographical errors.  Zara Council, [facility]  [address] [place], [place] [address] [phone]

## 2021-11-15 ENCOUNTER — Ambulatory Visit (INDEPENDENT_AMBULATORY_CARE_PROVIDER_SITE_OTHER): Payer: Self-pay | Admitting: Urology

## 2021-11-15 ENCOUNTER — Other Ambulatory Visit: Payer: Self-pay

## 2021-11-15 ENCOUNTER — Encounter: Payer: Self-pay | Admitting: Urology

## 2021-11-15 VITALS — BP 123/72 | HR 96 | Ht 70.0 in | Wt 255.0 lb

## 2021-11-15 DIAGNOSIS — K6389 Other specified diseases of intestine: Secondary | ICD-10-CM

## 2021-11-15 DIAGNOSIS — N529 Male erectile dysfunction, unspecified: Secondary | ICD-10-CM

## 2021-11-16 ENCOUNTER — Telehealth: Payer: Self-pay | Admitting: *Deleted

## 2021-11-16 LAB — TESTOSTERONE: Testosterone: 134 ng/dL — ABNORMAL LOW (ref 264–916)

## 2021-11-16 NOTE — Telephone Encounter (Signed)
-----   Message from Nori Riis, PA-C sent at 11/16/2021  7:52 AM EST ----- Please let Brian Rios know that his serum testosterone did return below normal and we need a repeat morning testosterone before 10 AM in 2 days.

## 2021-11-17 ENCOUNTER — Other Ambulatory Visit: Payer: Self-pay

## 2021-11-24 ENCOUNTER — Telehealth: Payer: Self-pay

## 2021-11-24 NOTE — Telephone Encounter (Signed)
Returned patients call regarding colonoscopy letter he had received. LVM to call office back.

## 2021-11-27 ENCOUNTER — Other Ambulatory Visit: Payer: Self-pay | Admitting: Gerontology

## 2021-11-27 ENCOUNTER — Other Ambulatory Visit: Payer: Self-pay

## 2021-11-27 DIAGNOSIS — R7303 Prediabetes: Secondary | ICD-10-CM

## 2021-11-27 DIAGNOSIS — Z8639 Personal history of other endocrine, nutritional and metabolic disease: Secondary | ICD-10-CM

## 2021-11-28 ENCOUNTER — Other Ambulatory Visit: Payer: Self-pay

## 2021-11-28 MED ORDER — METFORMIN HCL 1000 MG PO TABS
500.0000 mg | ORAL_TABLET | Freq: Every day | ORAL | 3 refills | Status: DC
  Filled 2021-11-28: qty 15, 30d supply, fill #0
  Filled 2022-02-24: qty 15, 30d supply, fill #1
  Filled 2022-03-29: qty 15, 30d supply, fill #2
  Filled 2022-03-30: qty 15, 30d supply, fill #0
  Filled 2022-05-21: qty 15, 30d supply, fill #1
  Filled 2022-06-29: qty 15, 30d supply, fill #2
  Filled 2022-08-13: qty 15, 30d supply, fill #3
  Filled 2022-09-17: qty 15, 30d supply, fill #4

## 2021-11-28 MED ORDER — ATORVASTATIN CALCIUM 40 MG PO TABS
ORAL_TABLET | Freq: Every day | ORAL | 1 refills | Status: DC
  Filled 2021-11-28: qty 30, 30d supply, fill #0
  Filled 2022-01-12: qty 30, 30d supply, fill #1

## 2021-11-30 IMAGING — CT CT HEAD W/O CM
3 series · 15 of 47 positions shown, 18 images · non-contrast
Comparison: None.

CLINICAL DATA: Mental status change

EXAM:
CT HEAD WITHOUT CONTRAST
TECHNIQUE: Contiguous axial images were obtained from the base of the skull
through the vertex without intravenous contrast.

[Series 2: head wo · axial · 0.46mm/px · z∈[+390,+525]mm · 9 of 33 slices shown, 12 images]
[im 3/33  brain]
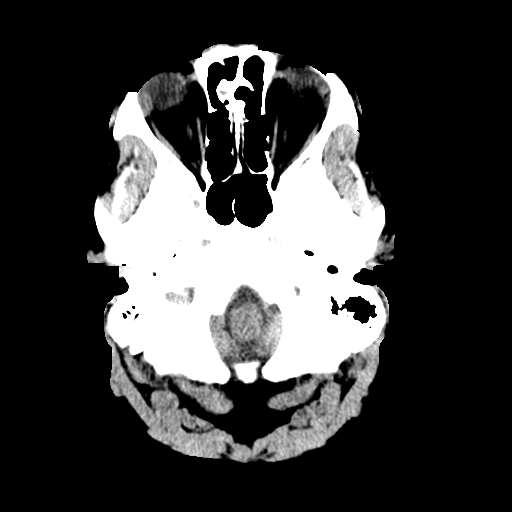
[im 3/33  bone]
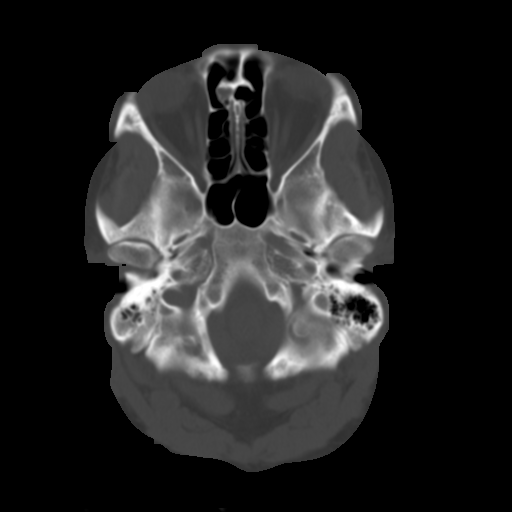
[im 6/33  brain]
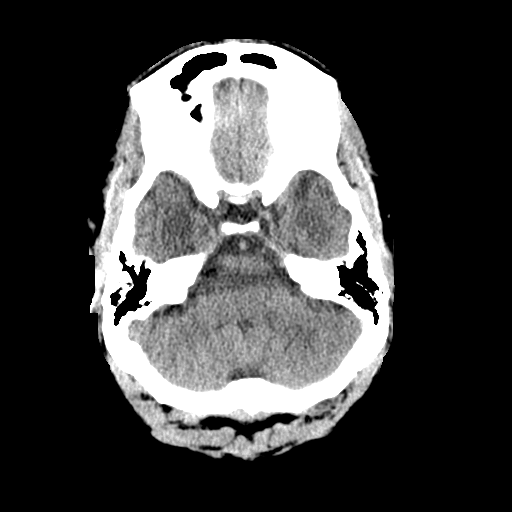
[im 9/33  brain]
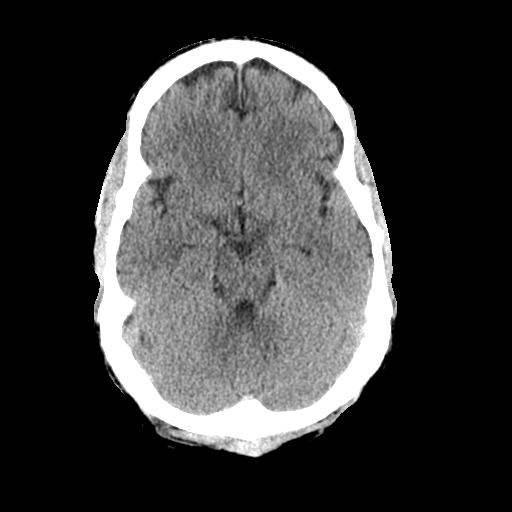
[im 13/33  brain]
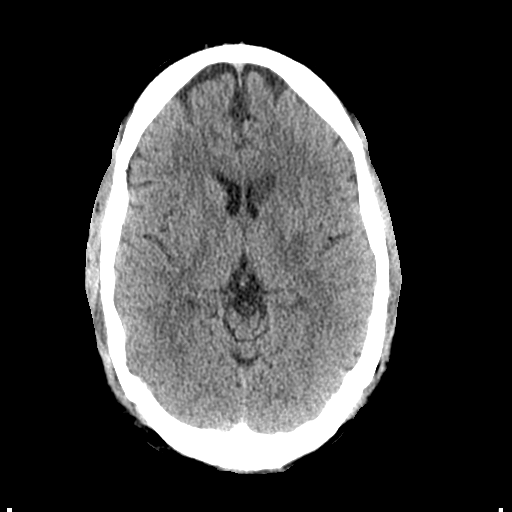
[im 17/33  brain]
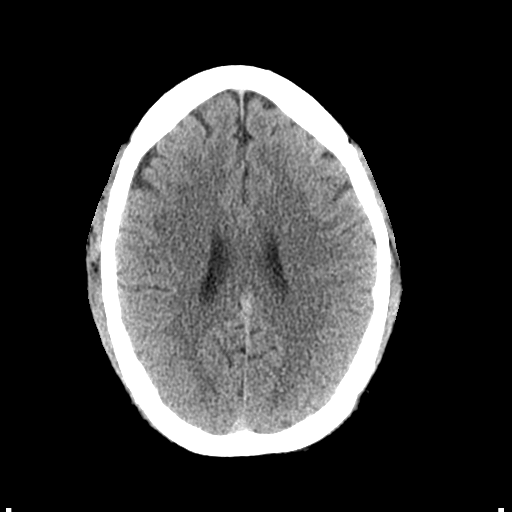
[im 17/33  bone]
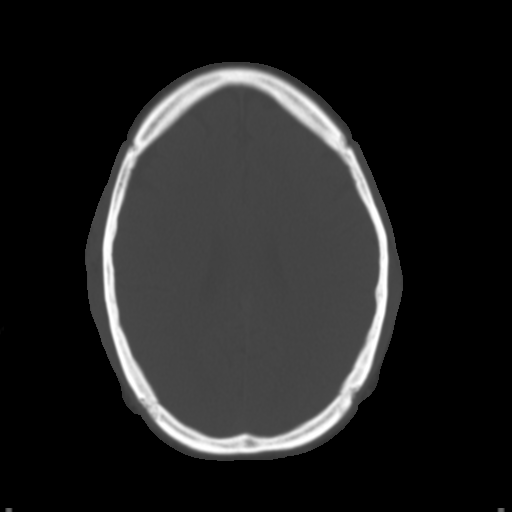
[im 20/33  brain]
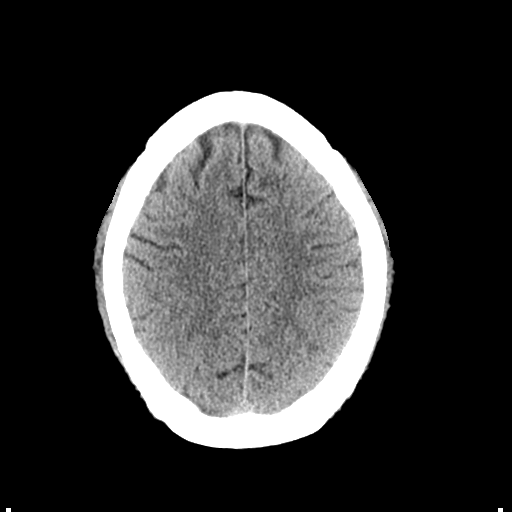
[im 24/33  brain]
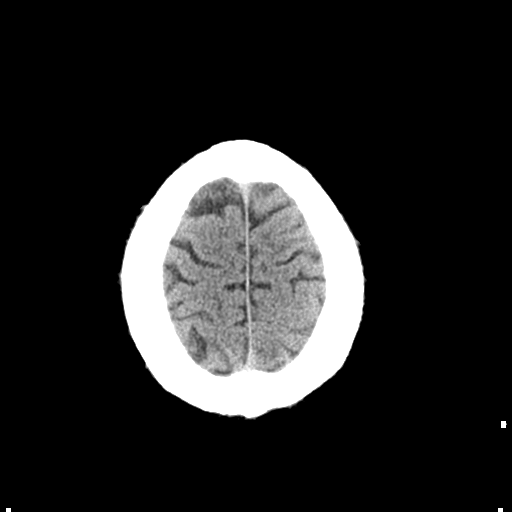
[im 27/33  brain]
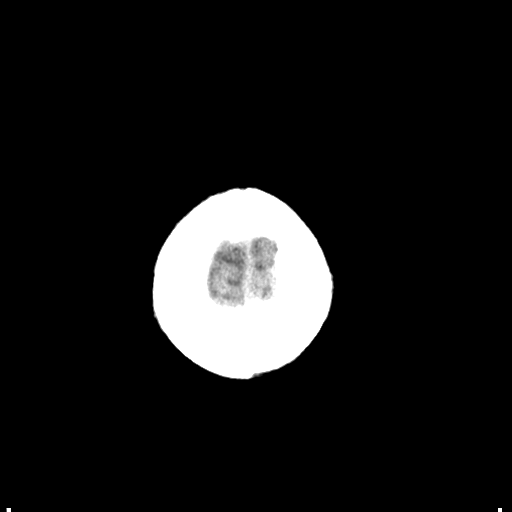
[im 30/33  brain]
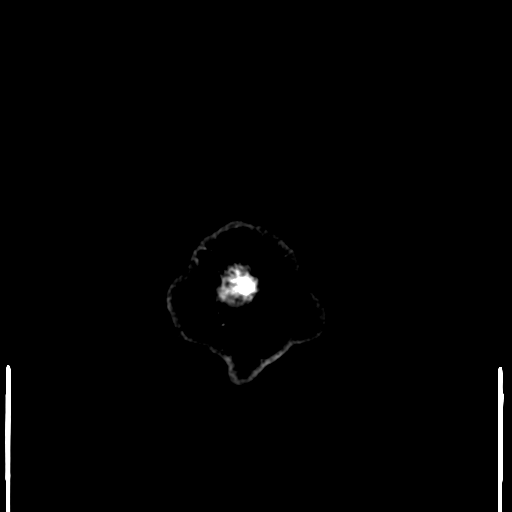
[im 30/33  bone]
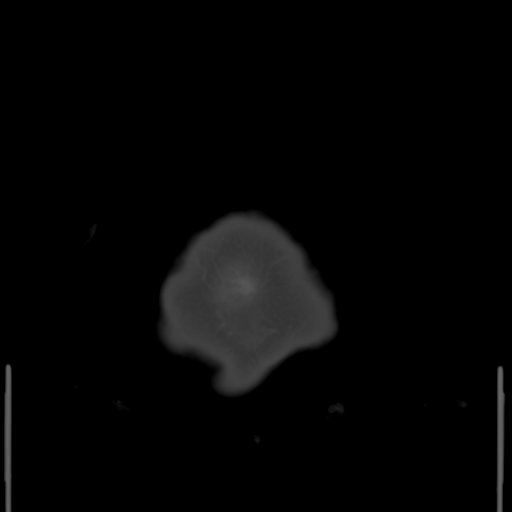

[Series 4: coronal soft tissue · coronal · 0.31mm/px · 3 of 71 slices shown]
[im 24/71  brain]
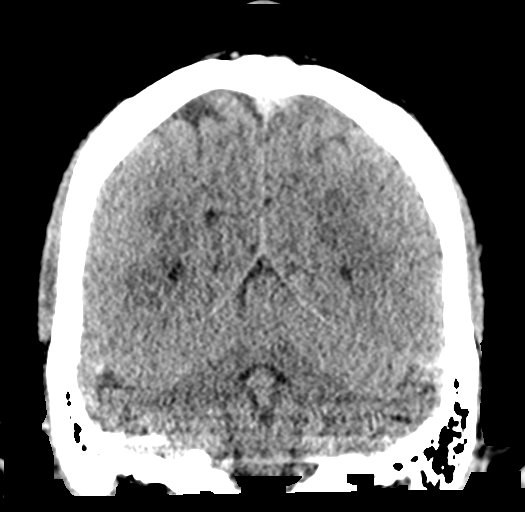
[im 32/71  brain]
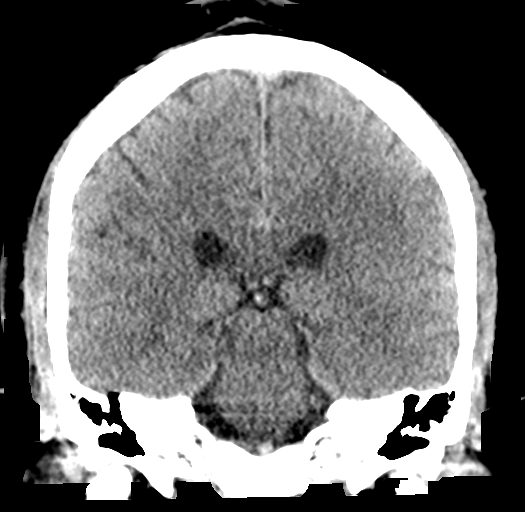
[im 39/71  brain]
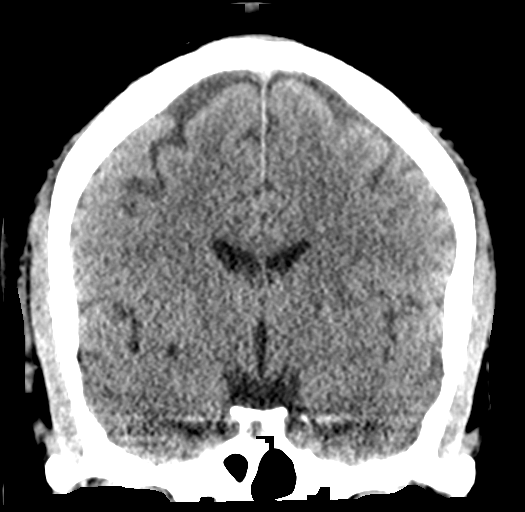

[Series 5: sagittal soft tissue · sagittal · 0.31mm/px · 3 of 55 slices shown]
[im 19/55  brain]
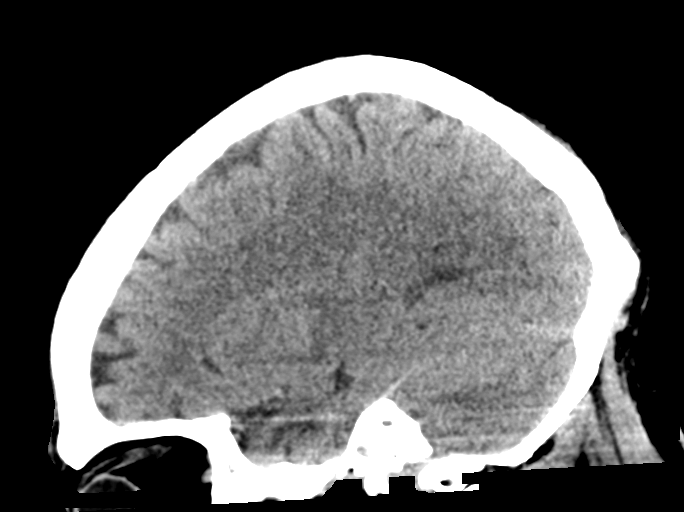
[im 28/55  brain]
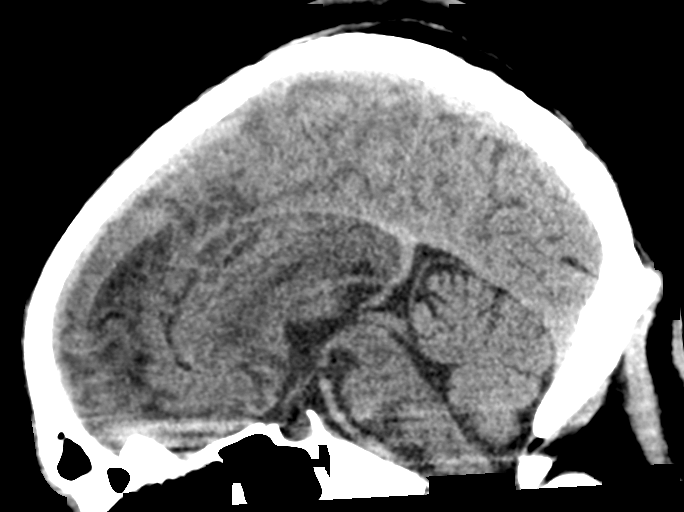
[im 37/55  brain]
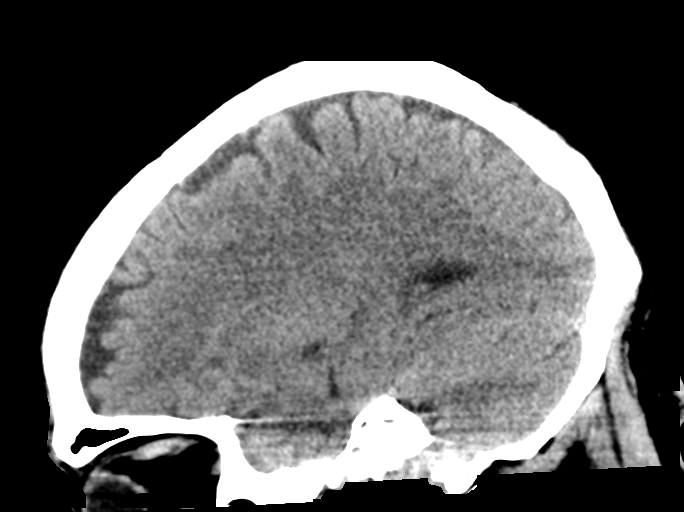

[15 of 47 positions shown; findings below may reference images not displayed]

FINDINGS: Brain: Abnormal hypoattenuation and enlargement of the left caudate
and swelling basal ganglia. Additional ill-defined hypoattenuation
within the overlying left corona radiata. No acute hemorrhage. Mild
effacement of the frontal horn of the left lateral ventricle.
Otherwise, no mass effect. No hydrocephalus.

Vascular: No hyperdense vessel identified.

Skull: Normal. Negative for fracture or focal lesion.

Sinuses/Orbits: No acute finding.

Other: No mastoid effusion.
IMPRESSION: Abnormal hypoattenuation and enlargement of the left caudate and
surrounding basal ganglia. Additional ill-defined hypoattenuation
within the overlying left corona radiata. Findings are concerning
for acute/early subacute infarct. Infiltrating tumor and
encephalitis are additional differential considerations. Recommend
MRI with and without contrast to further evaluate.

These findings and recommendations were results were called by
telephone at the time of interpretation on 07/17/2020 at [DATE] to
provider Dr. Nicolka, Who verbally acknowledged these results.

## 2021-11-30 IMAGING — MR MR MRA NECK WO/W CM
3 of 4 series · 33 of 48 positions shown · IV contrast (gadavist)
Comparison: 07/17/2020 head CT.
COMPARISON: 07/17/2020 head CT.

Addendum:
CLINICAL DATA: Stroke, follow-up

EXAM:
MR HEAD WITHOUT CONTRAST AND WITH
MR CIRCLE OF WILLIS WITHOUT CONTRAST
MRA OF THE NECK WITHOUT AND WITH CONTRAST
TECHNIQUE: Multiplanar, multiecho pulse sequences of the brain, circle of
willis and surrounding structures were obtained without intravenous
contrast. Postcontrast imaging of the brain was obtained.
Angiographic images of the neck were obtained using MRA technique
without and with intravenous contrast.
CONTRAST:  10mL GADAVIST GADOBUTROL 1 MMOL/ML IV SOLN

[Series 8: angio_fl3d_cor_pre_ttc=2.0s · coronal · 0.9mm · 0.85mm/px · 11 of 96 slices shown]
[im 1/96]
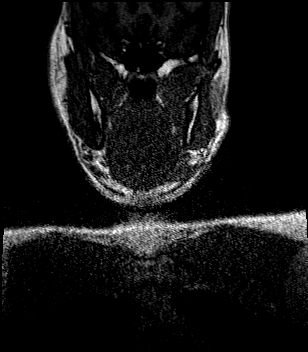
[im 10/96]
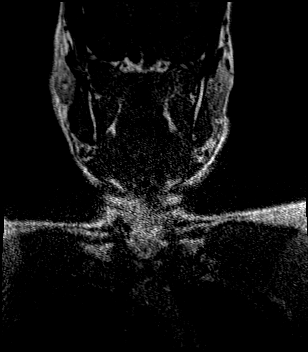
[im 20/96]
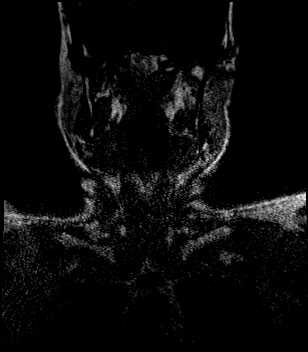
[im 29/96]
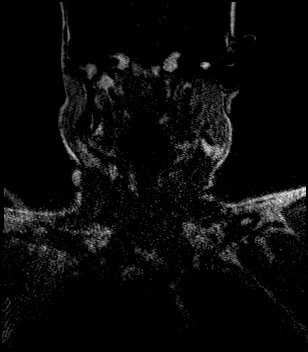
[im 39/96]
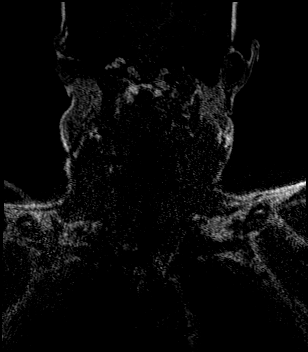
[im 48/96]
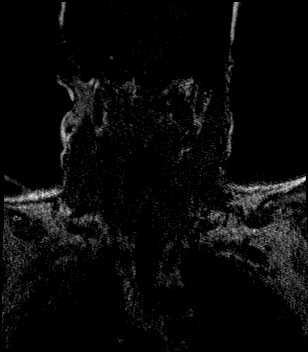
[im 58/96]
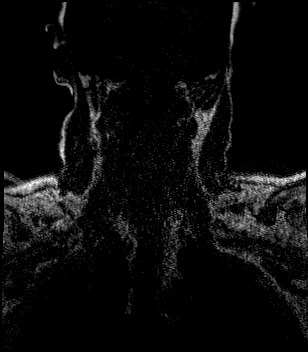
[im 67/96]
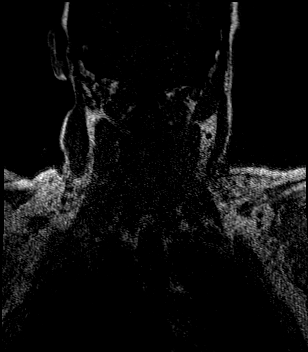
[im 77/96]
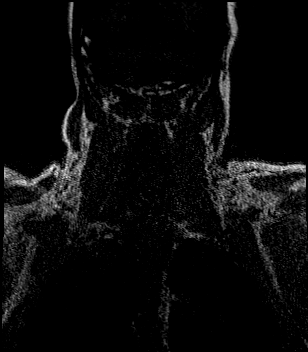
[im 86/96]
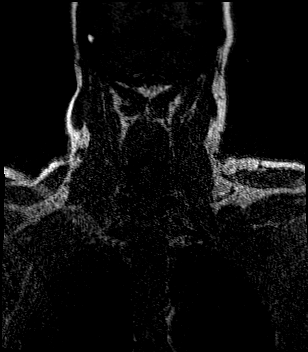
[im 96/96]
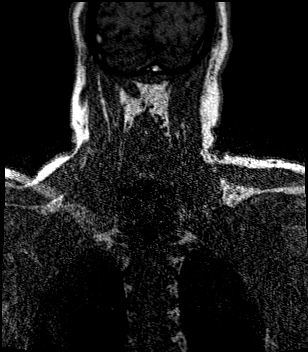

[Series 11: angio_fl3d_cor_post_ttc=2.0s_moco-adv · coronal · 0.9mm · 0.85mm/px · 11 of 96 slices shown]
[im 1/96]
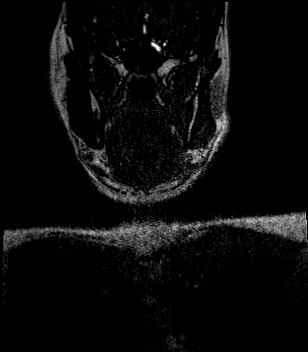
[im 10/96]
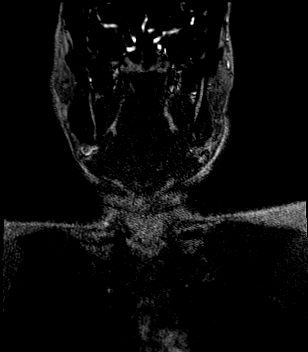
[im 20/96]
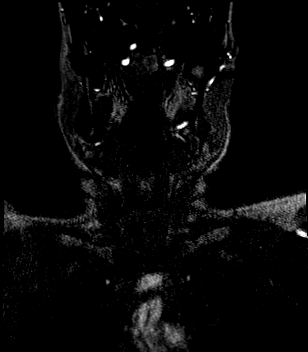
[im 29/96]
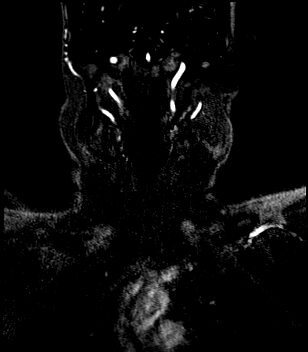
[im 39/96]
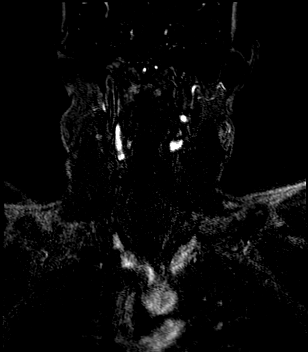
[im 48/96]
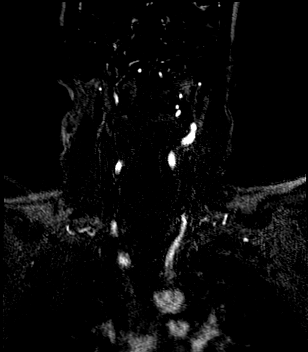
[im 58/96]
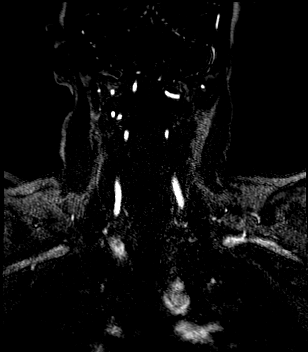
[im 67/96]
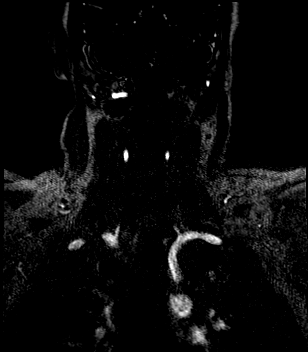
[im 77/96]
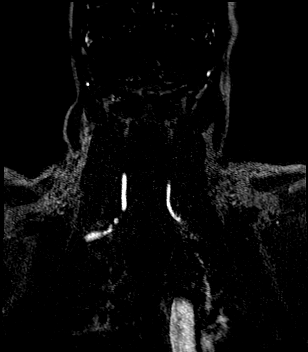
[im 86/96]
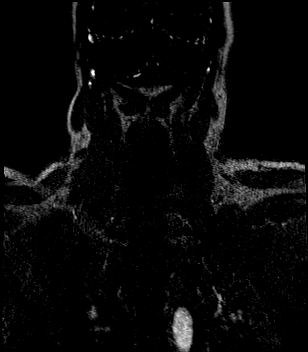
[im 96/96]
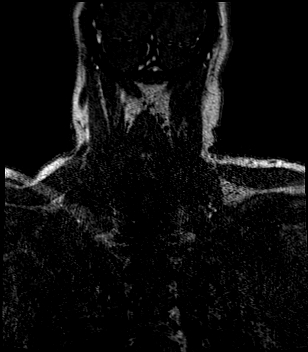

[Series 12: angio_fl3d_cor_post_ttc=2.0s_moco-adv_sub · coronal · 0.9mm · 0.85mm/px · 11 of 94 slices shown]
[im 1/94]
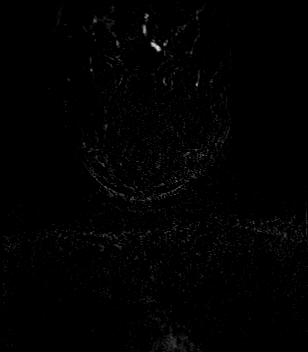
[im 10/94]
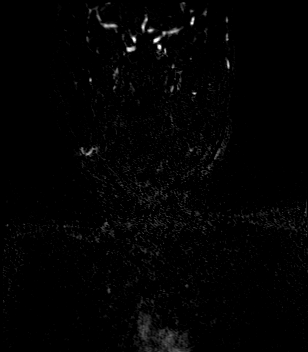
[im 19/94]
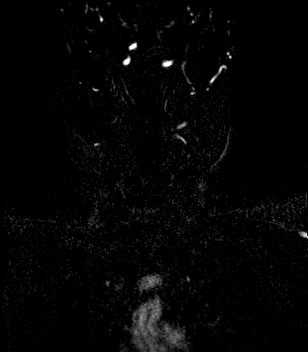
[im 28/94]
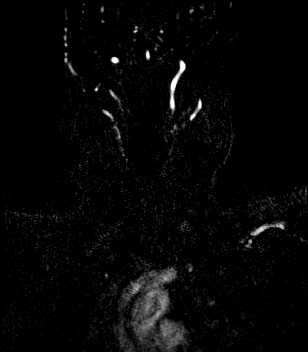
[im 38/94]
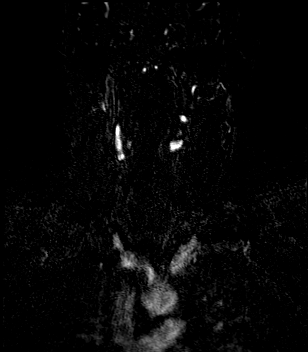
[im 47/94]
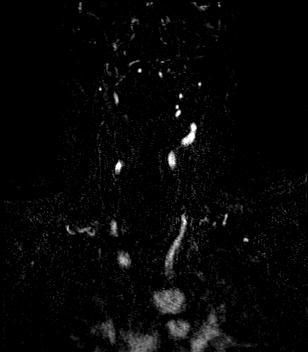
[im 56/94]
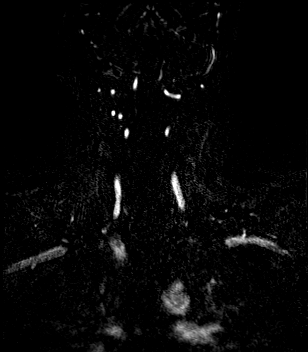
[im 66/94]
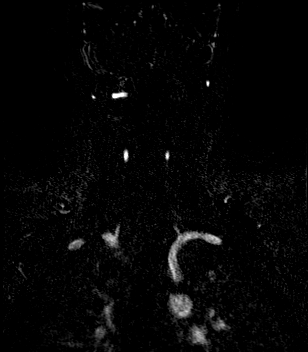
[im 75/94]
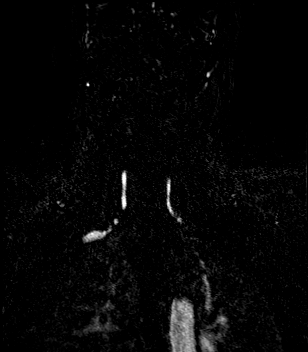
[im 84/94]
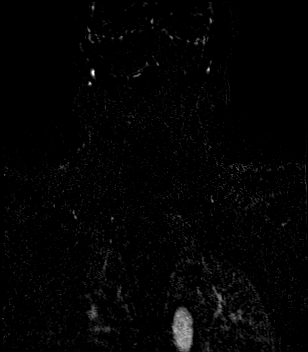
[im 94/94]
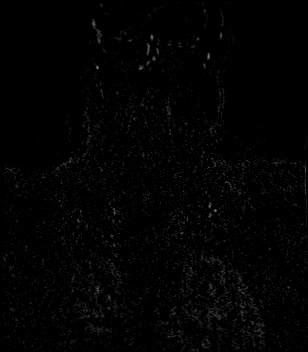

[33 of 48 positions shown; findings below may reference images not displayed]

FINDINGS: MR HEAD FINDINGS

Brain: Multifocal acute infarcts involving the left frontoparietal
and occipitotemporal regions. Larger infarcts are seen within the
left caudate and putamen. Minimal petechial hemorrhage along the
left caudate. No midline shift, ventriculomegaly or extra-axial
fluid collection. No mass lesion. No abnormal enhancement.

Vascular: Please see MRA head.

Skull and upper cervical spine: Normal marrow signal.

Sinuses/Orbits: Normal orbits. Ethmoid and right maxillary sinus
mucosal thickening. No mastoid effusion.

Other: None.

MR CIRCLE OF WILLIS FINDINGS

Anterior circulation: Left A1 segment aplasia. Patent, normal
caliber anterior cerebral arteries. Patent normal caliber appearance
of the internal carotid and middle cerebral arteries. No significant
stenosis, proximal occlusion, aneurysm, or vascular malformation.

Posterior circulation: Right dominant vertebral artery. Patent
normal caliber V4 segments, PICA, basilar, superior cerebellar and
posterior cerebral arteries. No significant stenosis, proximal
occlusion, aneurysm, or vascular malformation.

Venous sinuses: Patent.

Anatomic variants: Bilateral posterior communicating arteries are
either absent or hypoplastic.

MRA NECK FINDINGS

Motion artifact limits evaluation.

There is no high-grade narrowing or focal aneurysm involving the
bilateral carotid arteries. No evidence of dissection.

The bilateral vertebral arteries are patent and demonstrate
antegrade flow. Dominant right vertebral artery. Mild left V1
segment narrowing. No evidence of high-grade narrowing or focal
aneurysm.

Three vessel aortic arch.
IMPRESSION: Multifocal acute infarcts involving the left cerebrum and left basal
ganglia, likely embolic.

No high-grade narrowing, large vessel occlusion, dissection or
aneurysm within the head or neck.

ADDENDUM:
These results were called by telephone at the time of interpretation
on 07/17/2020 at [DATE] to provider MAHAMUD HIBBS , who verbally
acknowledged these results.

*** End of Addendum ***
FINDINGS: MR HEAD FINDINGS

Brain: Multifocal acute infarcts involving the left frontoparietal
and occipitotemporal regions. Larger infarcts are seen within the
left caudate and putamen. Minimal petechial hemorrhage along the
left caudate. No midline shift, ventriculomegaly or extra-axial
fluid collection. No mass lesion. No abnormal enhancement.

Vascular: Please see MRA head.

Skull and upper cervical spine: Normal marrow signal.

Sinuses/Orbits: Normal orbits. Ethmoid and right maxillary sinus
mucosal thickening. No mastoid effusion.

Other: None.

MR CIRCLE OF WILLIS FINDINGS

Anterior circulation: Left A1 segment aplasia. Patent, normal
caliber anterior cerebral arteries. Patent normal caliber appearance
of the internal carotid and middle cerebral arteries. No significant
stenosis, proximal occlusion, aneurysm, or vascular malformation.

Posterior circulation: Right dominant vertebral artery. Patent
normal caliber V4 segments, PICA, basilar, superior cerebellar and
posterior cerebral arteries. No significant stenosis, proximal
occlusion, aneurysm, or vascular malformation.

Venous sinuses: Patent.

Anatomic variants: Bilateral posterior communicating arteries are
either absent or hypoplastic.

MRA NECK FINDINGS

Motion artifact limits evaluation.

There is no high-grade narrowing or focal aneurysm involving the
bilateral carotid arteries. No evidence of dissection.

The bilateral vertebral arteries are patent and demonstrate
antegrade flow. Dominant right vertebral artery. Mild left V1
segment narrowing. No evidence of high-grade narrowing or focal
aneurysm.

Three vessel aortic arch.
IMPRESSION: Multifocal acute infarcts involving the left cerebrum and left basal
ganglia, likely embolic.

No high-grade narrowing, large vessel occlusion, dissection or
aneurysm within the head or neck.

## 2021-12-07 ENCOUNTER — Other Ambulatory Visit: Payer: Self-pay

## 2021-12-07 MED ORDER — ARIPIPRAZOLE 30 MG PO TABS
30.0000 mg | ORAL_TABLET | Freq: Every day | ORAL | 2 refills | Status: DC
  Filled 2021-12-07: qty 30, 30d supply, fill #0
  Filled 2022-01-12: qty 30, 30d supply, fill #1
  Filled 2022-02-24: qty 30, 30d supply, fill #2

## 2021-12-07 MED ORDER — BENZTROPINE MESYLATE 1 MG PO TABS
1.0000 mg | ORAL_TABLET | Freq: Every day | ORAL | 2 refills | Status: DC
  Filled 2021-12-07: qty 30, 30d supply, fill #0
  Filled 2022-01-12: qty 30, 30d supply, fill #1
  Filled 2022-02-24: qty 30, 30d supply, fill #2

## 2021-12-07 MED ORDER — MIRTAZAPINE 45 MG PO TABS
45.0000 mg | ORAL_TABLET | Freq: Every day | ORAL | 2 refills | Status: DC
  Filled 2021-12-07: qty 30, 30d supply, fill #0
  Filled 2022-01-12: qty 30, 30d supply, fill #1
  Filled 2022-02-24: qty 30, 30d supply, fill #2

## 2021-12-07 MED ORDER — INGREZZA 80 MG PO CAPS: 80.0000 mg | ORAL_CAPSULE | Freq: Every day | ORAL | 2 refills | Status: DC

## 2021-12-07 MED ORDER — DIVALPROEX SODIUM ER 250 MG PO TB24
750.0000 mg | ORAL_TABLET | Freq: Every day | ORAL | 2 refills | Status: DC
  Filled 2021-12-07: qty 90, 30d supply, fill #0
  Filled 2022-01-12: qty 90, 30d supply, fill #1
  Filled 2022-02-24: qty 90, 30d supply, fill #2

## 2021-12-07 MED ORDER — HYDROXYZINE HCL 25 MG PO TABS
75.0000 mg | ORAL_TABLET | Freq: Every evening | ORAL | 2 refills | Status: DC | PRN
  Filled 2021-12-07: qty 90, 30d supply, fill #0
  Filled 2022-01-12: qty 90, 30d supply, fill #1
  Filled 2022-02-24: qty 90, 30d supply, fill #2

## 2021-12-08 ENCOUNTER — Other Ambulatory Visit: Payer: Self-pay

## 2021-12-11 ENCOUNTER — Other Ambulatory Visit: Payer: Self-pay

## 2021-12-12 ENCOUNTER — Telehealth: Payer: Self-pay | Admitting: Emergency Medicine

## 2021-12-12 NOTE — Telephone Encounter (Signed)
-----   Message from Dede Query, [place] sent at 11/22/2021 10:55 AM EST ----- Regarding: Appt Pls call Mr. Pinzon and schedule him an appointment with Benjamine Mola in two weeks.

## 2021-12-12 NOTE — Telephone Encounter (Signed)
Called patient and scheduled OV with Benjamine Mola for 12/28/21 to discuss CT results from 06/2021 per Benjamine Mola. Patient agreed and voiced understanding.

## 2021-12-13 ENCOUNTER — Other Ambulatory Visit: Payer: Self-pay

## 2021-12-18 ENCOUNTER — Other Ambulatory Visit: Payer: Self-pay

## 2021-12-19 ENCOUNTER — Other Ambulatory Visit: Payer: Self-pay

## 2021-12-21 ENCOUNTER — Other Ambulatory Visit: Payer: Self-pay

## 2021-12-22 ENCOUNTER — Other Ambulatory Visit: Payer: Self-pay

## 2021-12-28 ENCOUNTER — Other Ambulatory Visit: Payer: Self-pay

## 2021-12-28 ENCOUNTER — Encounter: Payer: Self-pay | Admitting: Gerontology

## 2021-12-28 ENCOUNTER — Ambulatory Visit: Payer: Medicaid Other | Admitting: Gerontology

## 2021-12-28 DIAGNOSIS — R9389 Abnormal findings on diagnostic imaging of other specified body structures: Secondary | ICD-10-CM

## 2021-12-28 NOTE — Progress Notes (Signed)
Established Patient Office Visit  Subjective:  Patient ID: Brian Rios, male    DOB: Apr 29, 1981  Age: 41 y.o. MRN: [id number]  CC:  Chief Complaint  Patient presents with   Follow-up    CT results    HPI Brian Rios is a 41 y/o male who has history of Alcohol abuse, Depression, Drug abuse, CVA presents for routine follow. He states that he's compliant with his medication, denies side effects and continues to make healthy lifestyle changes. His Abdominal CT done at [facility] on 07/17/21 showed soft tissue mass is epicentered within the right lower quadrant small bowel mesentery measuring approximately 4.2 x 3.6 cm with subjacent thickened small bowel loop with nodularity that measures 2.3 cm (2:95). 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 Additional mesenteric implants are noted. For reference: 1.8 cm mesenteric nodule left upper quadrant small bowel mesentery (2:60). Central mesenteric nodule measures 2.2 x 1.1 cm (2:86).  This reading was addended to the original CT report.  Attempts were made to reach the patient, but he could not be contacted. Currently, he denies abdominal pain, constipation, diahhrea and nausea. He states that he will follow up with Gastroenterology  Dr Jonathon Bellows on 02/05/22. Overall, he states that he's doing well and offers no further complaint.  Past Medical History:  Diagnosis Date   Alcohol abuse    Depression    Drug abuse ([place])    Tobacco abuse     Past Surgical History:  Procedure Laterality Date   ORIF ORBITAL FRACTURE     TEE WITHOUT CARDIOVERSION N/A 07/19/2020   Procedure: TRANSESOPHAGEAL ECHOCARDIOGRAM (TEE);  Surgeon: Corey Skains, MD;  Location: [facility] ORS;  Service: Cardiovascular;  Laterality: N/A;    Family History  Problem Relation Age of Onset   Alcoholism Mother    Hypertension Mother    Hyperlipidemia Mother    Breast cancer Mother    Alcoholism Father    Diabetes Maternal Grandmother      Social History   Socioeconomic History   Marital status: Single    Spouse name: Not on file   Number of children: Not on file   Years of education: Not on file   Highest education level: Not on file  Occupational History   Not on file  Tobacco Use   Smoking status: Every Day    Packs/day: 1.00    Years: 15.00    Pack years: 15.00    Types: Cigarettes   Smokeless tobacco: Former    Quit date: 03/09/2003  Vaping Use   Vaping Use: Never used  Substance and Sexual Activity   Alcohol use: Yes    Alcohol/week: 7.0 standard drinks    Types: 7 Cans of beer per week   Drug use: Not Currently    Types: "Crack" cocaine    Comment: last use 2016   Sexual activity: Yes    Birth control/protection: None  Other Topics Concern   Not on file  Social History Narrative   Not on file   Social Determinants of Health   Financial Resource Strain: Not on file  Food Insecurity: No Food Insecurity   Worried About Running Out of Food in the Last Year: Never true   Ran Out of Food in the Last Year: Never true  Transportation Needs: No Transportation Needs   Lack of Transportation (Medical): No   Lack of Transportation (Non-Medical): No  Physical Activity: Not on file  Stress: Not on file  Social Connections: Not on file  Intimate Partner Violence: Not on file    Outpatient Medications Prior to Visit  Medication Sig Dispense Refill   ARIPiprazole (ABILIFY) 30 MG tablet Take 1 tablet (30 mg total) by mouth once daily at bedtime. 30 tablet 2   ARIPiprazole ER (ABILIFY MAINTENA) 400 MG PRSY prefilled syringe Inject 400 mg into the muscle every 30 (thirty) days. Last ~3 weeks ago (noted 3/9)     atorvastatin (LIPITOR) 40 MG tablet TAKE ONE TABLET BY MOUTH ONCE EVERY DAY. 30 tablet 1   benztropine (COGENTIN) 1 MG tablet Take 1 tablet (1 mg total) by mouth once daily at bedtime. 30 tablet 2   divalproex (DEPAKOTE ER) 250 MG 24 hr tablet Take 3 tablets (750 mg total) by mouth once daily at  bedtime. 90 tablet 2   escitalopram (LEXAPRO) 20 MG tablet TAKE ONE TABLET BY MOUTH ONCE DAILY. 30 tablet 2   hydrOXYzine (ATARAX) 25 MG tablet Take 3 tablets (75 mg total) by mouth once daily at bedtime as needed. (Patient taking differently: Take 75 mg by mouth at bedtime as needed. Needs ~1 x per day (takes at nighttime)) 90 tablet 2   metFORMIN (GLUCOPHAGE) 1000 MG tablet Take (1/2) tablet (500 mg total) by mouth once daily with breakfast. 30 tablet 3   mirtazapine (REMERON) 45 MG tablet Take 1 tablet (45 mg total) by mouth once daily at bedtime. 30 tablet 2   tadalafil (CIALIS) 5 MG tablet Take 2-4 tablets (10-20 mg total) by mouth once daily as needed for erectile dysfunction. (Patient taking differently: Take 10-20 mg by mouth daily as needed for erectile dysfunction. Once a day) 30 tablet 11   valbenazine (INGREZZA) 80 MG capsule Take 1 capsule (80 mg total) by mouth once daily at bedtime. (Patient taking differently: Take 80 mg by mouth at bedtime. Pt receives delivery) 30 capsule 2   No facility-administered medications prior to visit.    No Known Allergies  ROS Review of Systems  Constitutional: Negative.   Eyes: Negative.   Respiratory: Negative.    Cardiovascular: Negative.   Gastrointestinal:  Negative for abdominal distention, abdominal pain, blood in stool and constipation.  Psychiatric/Behavioral: Negative.       Objective:    Physical Exam HENT:     Head: Normocephalic and atraumatic.  Cardiovascular:     Rate and Rhythm: Normal rate and regular rhythm.     Pulses: Normal pulses.     Heart sounds: Normal heart sounds.  Pulmonary:     Effort: Pulmonary effort is normal.     Breath sounds: Normal breath sounds.  Abdominal:     General: Bowel sounds are normal. There is no distension.     Palpations: Abdomen is soft.     Tenderness: There is no abdominal tenderness. There is no guarding.     Hernia: No hernia is present.  Neurological:     General: No focal  deficit present.     Mental Status: He is alert and oriented to person, place, and time. Mental status is at baseline.  Psychiatric:        Mood and Affect: Mood normal.        Behavior: Behavior normal.        Thought Content: Thought content normal.        Judgment: Judgment normal.    BP 134/81 (BP Location: Left Arm, Patient Position: Sitting, Cuff Size: Large)    Pulse 100    Temp (!) 97.5 F (36.4 C) (Oral)  Resp 16    Ht $R'5\' 10"'Nu$  (1.778 m)    Wt 253 lb 9.6 oz (115 kg)    SpO2 95%    BMI 36.39 kg/m  Wt Readings from Last 3 Encounters:  12/28/21 253 lb 9.6 oz (115 kg)  11/15/21 255 lb (115.7 kg)  11/02/21 252 lb 3.2 oz (114.4 kg)   Encouraged weight loss  Health Maintenance Due  Topic Date Due   COVID-19 Vaccine (1) Never done   Hepatitis C Screening  Never done    There are no preventive care reminders to display for this patient.  Lab Results  Component Value Date   TSH 4.040 03/28/2021   Lab Results  Component Value Date   WBC 10.9 (H) 03/28/2021   HGB 15.0 03/28/2021   HCT 45.0 03/28/2021   MCV 82 03/28/2021   PLT 246 03/28/2021   Lab Results  Component Value Date   NA 141 03/28/2021   K 4.4 03/28/2021   CO2 21 03/28/2021   GLUCOSE 85 03/28/2021   BUN 6 03/28/2021   CREATININE 0.96 03/28/2021   BILITOT <0.2 03/28/2021   ALKPHOS 58 03/28/2021   AST 14 03/28/2021   ALT 13 03/28/2021   PROT 5.6 (L) 03/28/2021   ALBUMIN 3.6 (L) 03/28/2021   CALCIUM 8.8 03/28/2021   ANIONGAP 10 07/17/2020   EGFR 102 03/28/2021   Lab Results  Component Value Date   CHOL 144 03/28/2021   Lab Results  Component Value Date   HDL 48 03/28/2021   Lab Results  Component Value Date   LDLCALC 80 03/28/2021   Lab Results  Component Value Date   TRIG 83 03/28/2021   Lab Results  Component Value Date   CHOLHDL 3.0 03/28/2021   Lab Results  Component Value Date   HGBA1C 6.0 (H) 03/28/2021      Assessment & Plan:   1. Abnormal CT scan -He was advised to  follow up with Gastroenterology Dr Bailey Mech on 02/05/22 and to complete [facility] financial application. He was advised to notify clinic and go to the ED for worsening symptoms.     Follow-up: Return in about 7 weeks (around 02/14/2022), or if symptoms worsen or fail to improve.    Melisia Leming Jerold Coombe, NP

## 2022-01-12 ENCOUNTER — Other Ambulatory Visit: Payer: Self-pay

## 2022-01-15 ENCOUNTER — Other Ambulatory Visit: Payer: Self-pay

## 2022-01-16 ENCOUNTER — Other Ambulatory Visit: Payer: Self-pay

## 2022-01-22 ENCOUNTER — Other Ambulatory Visit: Payer: Self-pay

## 2022-02-05 ENCOUNTER — Ambulatory Visit: Payer: Medicaid Other | Admitting: Gastroenterology

## 2022-02-14 ENCOUNTER — Other Ambulatory Visit: Payer: Medicaid Other

## 2022-02-21 ENCOUNTER — Ambulatory Visit: Payer: Medicaid Other | Admitting: Gerontology

## 2022-02-22 ENCOUNTER — Ambulatory Visit: Payer: Medicaid Other | Admitting: Gerontology

## 2022-02-23 ENCOUNTER — Other Ambulatory Visit: Payer: Self-pay

## 2022-02-24 ENCOUNTER — Other Ambulatory Visit: Payer: Self-pay | Admitting: Gerontology

## 2022-02-24 DIAGNOSIS — Z8639 Personal history of other endocrine, nutritional and metabolic disease: Secondary | ICD-10-CM

## 2022-02-26 ENCOUNTER — Other Ambulatory Visit: Payer: Self-pay

## 2022-02-27 ENCOUNTER — Other Ambulatory Visit: Payer: Self-pay

## 2022-02-27 MED ORDER — ATORVASTATIN CALCIUM 40 MG PO TABS
ORAL_TABLET | Freq: Every day | ORAL | 0 refills | Status: DC
  Filled 2022-02-27: qty 30, 30d supply, fill #0

## 2022-03-21 ENCOUNTER — Ambulatory Visit: Payer: Medicaid Other | Admitting: Gerontology

## 2022-03-22 ENCOUNTER — Ambulatory Visit: Payer: Medicaid Other | Admitting: Gerontology

## 2022-03-29 ENCOUNTER — Other Ambulatory Visit: Payer: Self-pay

## 2022-03-29 ENCOUNTER — Other Ambulatory Visit: Payer: Self-pay | Admitting: Gerontology

## 2022-03-29 DIAGNOSIS — Z8639 Personal history of other endocrine, nutritional and metabolic disease: Secondary | ICD-10-CM

## 2022-03-30 ENCOUNTER — Other Ambulatory Visit: Payer: Self-pay

## 2022-04-06 ENCOUNTER — Other Ambulatory Visit: Payer: Self-pay

## 2022-04-19 ENCOUNTER — Other Ambulatory Visit: Payer: Self-pay

## 2022-04-19 MED ORDER — DIVALPROEX SODIUM ER 250 MG PO TB24
ORAL_TABLET | ORAL | 2 refills | Status: DC
  Filled 2022-04-19: qty 90, 30d supply, fill #0
  Filled 2022-06-03: qty 90, 30d supply, fill #1
  Filled 2022-07-10: qty 90, 30d supply, fill #2

## 2022-04-19 MED ORDER — ESCITALOPRAM OXALATE 20 MG PO TABS
ORAL_TABLET | ORAL | 2 refills | Status: DC
  Filled 2022-04-19: qty 30, 30d supply, fill #0
  Filled 2022-06-03: qty 30, 30d supply, fill #1
  Filled 2022-07-10: qty 30, 30d supply, fill #2

## 2022-04-19 MED ORDER — BENZTROPINE MESYLATE 0.5 MG PO TABS
ORAL_TABLET | ORAL | 2 refills | Status: DC
  Filled 2022-04-19: qty 60, 30d supply, fill #0
  Filled 2022-06-03: qty 60, 30d supply, fill #1
  Filled 2022-07-10: qty 60, 30d supply, fill #2

## 2022-04-19 MED ORDER — INGREZZA 80 MG PO CAPS
80.0000 mg | ORAL_CAPSULE | Freq: Every day | ORAL | 2 refills | Status: DC
Start: 2022-04-19 — End: 2022-09-03

## 2022-04-19 MED ORDER — MIRTAZAPINE 45 MG PO TABS
ORAL_TABLET | ORAL | 2 refills | Status: DC
  Filled 2022-04-19: qty 30, 30d supply, fill #0
  Filled 2022-06-03: qty 30, 30d supply, fill #1
  Filled 2022-07-10: qty 30, 30d supply, fill #2

## 2022-04-19 MED ORDER — ARIPIPRAZOLE 30 MG PO TABS
ORAL_TABLET | ORAL | 2 refills | Status: DC
  Filled 2022-04-19: qty 30, 30d supply, fill #0
  Filled 2022-06-03: qty 30, 30d supply, fill #1
  Filled 2022-06-29 – 2022-07-10 (×2): qty 30, 30d supply, fill #2

## 2022-04-20 ENCOUNTER — Other Ambulatory Visit: Payer: Self-pay

## 2022-04-23 ENCOUNTER — Other Ambulatory Visit: Payer: Self-pay

## 2022-05-03 ENCOUNTER — Ambulatory Visit: Payer: Medicaid Other | Admitting: Gerontology

## 2022-05-09 ENCOUNTER — Ambulatory Visit: Payer: Medicaid Other | Admitting: Gerontology

## 2022-05-10 ENCOUNTER — Other Ambulatory Visit: Payer: Self-pay

## 2022-05-10 MED ORDER — HYDROXYZINE HCL 25 MG PO TABS
ORAL_TABLET | ORAL | 1 refills | Status: DC
  Filled 2022-05-10: qty 90, 30d supply, fill #0
  Filled 2022-06-29: qty 90, 30d supply, fill #1

## 2022-05-17 ENCOUNTER — Encounter: Payer: Self-pay | Admitting: Gerontology

## 2022-05-17 ENCOUNTER — Ambulatory Visit: Payer: Medicaid Other | Admitting: Gerontology

## 2022-05-17 ENCOUNTER — Other Ambulatory Visit: Payer: Self-pay

## 2022-05-17 VITALS — BP 112/80 | HR 96 | Wt 240.1 lb

## 2022-05-17 DIAGNOSIS — R9389 Abnormal findings on diagnostic imaging of other specified body structures: Secondary | ICD-10-CM

## 2022-05-17 DIAGNOSIS — Z8639 Personal history of other endocrine, nutritional and metabolic disease: Secondary | ICD-10-CM

## 2022-05-17 DIAGNOSIS — R079 Chest pain, unspecified: Secondary | ICD-10-CM | POA: Insufficient documentation

## 2022-05-17 MED ORDER — ATORVASTATIN CALCIUM 40 MG PO TABS
ORAL_TABLET | Freq: Every day | ORAL | 2 refills | Status: DC
  Filled 2022-05-17: qty 30, 30d supply, fill #0
  Filled 2022-06-29: qty 30, 30d supply, fill #1
  Filled 2022-08-13: qty 30, 30d supply, fill #2

## 2022-05-17 NOTE — Progress Notes (Signed)
Established Patient Office Visit  Subjective   Patient ID: Brian Rios, male    DOB: [birth year]  Age: 41 y.o. MRN: [id number]  No chief complaint on file.   HPI  Brian Rios is a 41 y/o male who has history of Alcohol abuse, Depression, Drug abuse, CVA presents for routine follow up. He is not compliant with his medications and follow up visit. Currently, he c/o intermittent right sided chest pain that occurs with taking a deep breath and while at rest that started 3 months ago. He states that pain lasts all day, and states that his pain is a dull 4/10 during visit. He reports fatigue, denies shortness of breath, dizziness, nausea and vomiting.  He's unable to describe pain. He continues to smoke 1 pack of cigarette in 2 days and admits the desire to quit. His Abdominal CT done at [facility] on 07/17/21 showed soft tissue mass is epicentered within the right lower quadrant small bowel mesentery measuring approximately 4.2 x 3.6 cm with subjacent thickened small bowel loop with nodularity that measures 2.3 cm (2:95). 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 Additional mesenteric implants are noted. For reference: 1.8 cm mesenteric nodule left upper quadrant small bowel mesentery (2:60). Central mesenteric nodule measures 2.2 x 1.1 cm (2:86).  This reading was addended to the original CT report.  Attempts were made to reach the patient, but he could not be contacted. Currently, he denies abdominal pain, constipation, diahhrea and nausea. He did not follow up with Gastroenterology  Dr Jonathon Bellows on 02/05/22. He offers no further complaint.    Review of Systems  Constitutional: Negative.   Respiratory: Negative.    Cardiovascular:  Positive for chest pain.  Neurological: Negative.   Psychiatric/Behavioral: Negative.        Objective:     BP 112/80 (BP Location: Right Arm, Patient Position: Sitting, Cuff Size: Large)   Pulse 96   Wt 240 lb 1.6 oz  (108.9 kg)   BMI 34.45 kg/m  BP Readings from Last 3 Encounters:  05/17/22 112/80  12/28/21 134/81  11/15/21 123/72   Wt Readings from Last 3 Encounters:  05/17/22 240 lb 1.6 oz (108.9 kg)  12/28/21 253 lb 9.6 oz (115 kg)  11/15/21 255 lb (115.7 kg)    Encouraged to continue weight loss regimen  Physical Exam HENT:     Head: Normocephalic and atraumatic.  Cardiovascular:     Rate and Rhythm: Normal rate and regular rhythm.     Pulses: Normal pulses.     Heart sounds: Normal heart sounds.  Pulmonary:     Effort: Pulmonary effort is normal.     Breath sounds: Normal breath sounds.  Musculoskeletal:        General: Normal range of motion.  Neurological:     General: No focal deficit present.     Mental Status: He is alert and oriented to person, place, and time. Mental status is at baseline.  Psychiatric:        Mood and Affect: Mood normal.        Behavior: Behavior normal.        Thought Content: Thought content normal.        Judgment: Judgment normal.      No results found for any visits on 05/17/22.  Last CBC Lab Results  Component Value Date   WBC 10.9 (H) 03/28/2021   HGB 15.0 03/28/2021   HCT 45.0 03/28/2021   MCV 82 03/28/2021   MCH 27.4  03/28/2021   RDW 13.4 03/28/2021   PLT 246 43/15/4008   Last metabolic panel Lab Results  Component Value Date   GLUCOSE 85 03/28/2021   NA 141 03/28/2021   K 4.4 03/28/2021   CL 108 (H) 03/28/2021   CO2 21 03/28/2021   BUN 6 03/28/2021   CREATININE 0.96 03/28/2021   EGFR 102 03/28/2021   CALCIUM 8.8 03/28/2021   PROT 5.6 (L) 03/28/2021   ALBUMIN 3.6 (L) 03/28/2021   LABGLOB 2.0 03/28/2021   AGRATIO 1.8 03/28/2021   BILITOT <0.2 03/28/2021   ALKPHOS 58 03/28/2021   AST 14 03/28/2021   ALT 13 03/28/2021   ANIONGAP 10 07/17/2020   Last lipids Lab Results  Component Value Date   CHOL 144 03/28/2021   HDL 48 03/28/2021   LDLCALC 80 03/28/2021   TRIG 83 03/28/2021   CHOLHDL 3.0 03/28/2021   Last  hemoglobin A1c Lab Results  Component Value Date   HGBA1C 6.0 (H) 03/28/2021      The ASCVD Risk score (Arnett DK, et al., 2019) failed to calculate for the following reasons:   The patient has a prior MI or stroke diagnosis    Assessment & Plan:   1. H/O elevated lipids - He will continue on current medication, low fat/cholesterol diet and exercise as tolerated. - atorvastatin (LIPITOR) 40 MG tablet; TAKE ONE TABLET BY MOUTH ONCE EVERY DAY.  Dispense: 30 tablet; Refill: 2  2. Chest pain, unspecified type - He was advised to go to the ED, declines staff calling EMS, states that he will go later.  3. Abnormal CT scan - He was provided Gastroenterology number to call and schedule an appointment.    Return in about 20 days (around 06/06/2022).    Asiana Benninger Jerold Coombe, NP

## 2022-05-21 ENCOUNTER — Other Ambulatory Visit: Payer: Self-pay

## 2022-05-23 ENCOUNTER — Other Ambulatory Visit: Payer: Self-pay

## 2022-05-30 ENCOUNTER — Other Ambulatory Visit: Payer: Medicaid Other

## 2022-06-03 ENCOUNTER — Other Ambulatory Visit: Payer: Self-pay

## 2022-06-04 ENCOUNTER — Other Ambulatory Visit: Payer: Self-pay

## 2022-06-05 ENCOUNTER — Other Ambulatory Visit: Payer: Self-pay

## 2022-06-07 ENCOUNTER — Ambulatory Visit: Payer: Medicaid Other | Admitting: Gerontology

## 2022-06-12 ENCOUNTER — Ambulatory Visit: Payer: Medicaid Other | Admitting: Gerontology

## 2022-06-12 ENCOUNTER — Encounter: Payer: Self-pay | Admitting: Gerontology

## 2022-06-12 ENCOUNTER — Other Ambulatory Visit: Payer: Self-pay

## 2022-06-12 DIAGNOSIS — K029 Dental caries, unspecified: Secondary | ICD-10-CM

## 2022-06-12 NOTE — Progress Notes (Signed)
Established Patient Office Visit  Subjective   Patient ID: Brian Rios, male    DOB: April 23, 1981  Age: 41 y.o. MRN: [id number]  Chief Complaint  Patient presents with   Follow-up   Dental Pain    Pt states pain in top left molar for last two weeks, would like info on dental referral    HPI  Brian Rios is a 41 y/o male who has a history of alcohol abuse, depression, drug abuse, and CVA who presents for a routine follow up. He presented to the clinic on 05/17/22 for chest pain. He states that he has not experienced any chest pain since 7/27, and is not experiencing any pain now. He was also instructed to schedule an appointment with GI due to his abnormal abdominal CT results on 07/17/21 which showed a soft tissue mass concerning for neoplasm that is epicentered within the right lower quadrant small bowel mesentery measuring approximately 4.2 x 3.6 cm with subjacent thickened small bowel loop with nodularity that measures 2.3 cm (2:95). He has an appointment with GI scheduled for 06/28/22. He also needs new glasses. His most recent prescription was printed and new glasses were ordered through the clinic. He expresses no issues with his medications and that they are working well for him. His mood is good and he is experiencing no suicidal or homicidal ideations. He has been experiencing dental pain in the last couple weeks on his left lower jaw. He states that the pain is a dull throbbing ache, and it will awaken him from sleep. The pain occurs approximately 3 times a week and is not present at this time.   Review of Systems  Constitutional: Negative.   Eyes: Negative.   Respiratory: Negative.    Cardiovascular: Negative.   Gastrointestinal: Negative.   Genitourinary: Negative.   Musculoskeletal: Negative.   Neurological: Negative.   Psychiatric/Behavioral:  Positive for depression (chronic). The patient has insomnia (chronic).       Objective:     BP 125/73 (BP Location:  Right Arm, Patient Position: Sitting, Cuff Size: Large)   Pulse 89   Temp 98.4 F (36.9 C) (Oral)   Ht $R'5\' 10"'gv$  (1.778 m)   Wt 243 lb 9.6 oz (110.5 kg)   SpO2 95%   BMI 34.95 kg/m  BP Readings from Last 3 Encounters:  06/12/22 125/73  05/17/22 112/80  12/28/21 134/81   Wt Readings from Last 3 Encounters:  06/12/22 243 lb 9.6 oz (110.5 kg)  05/17/22 240 lb 1.6 oz (108.9 kg)  12/28/21 253 lb 9.6 oz (115 kg)      Physical Exam Constitutional:      Appearance: Normal appearance.  HENT:     Head: Normocephalic and atraumatic.     Mouth/Throat:     Dentition: Abnormal dentition (decay on two rear molars in left mandible).  Cardiovascular:     Rate and Rhythm: Normal rate and regular rhythm.     Heart sounds: Normal heart sounds.  Pulmonary:     Effort: Pulmonary effort is normal.     Breath sounds: Normal breath sounds.  Musculoskeletal:        General: Normal range of motion.  Neurological:     General: No focal deficit present.     Mental Status: He is oriented to person, place, and time. Mental status is at baseline.  Psychiatric:        Mood and Affect: Mood normal.        Behavior: Behavior normal.  Thought Content: Thought content normal.        Judgment: Judgment normal.      No results found for any visits on 06/12/22.  Last CBC Lab Results  Component Value Date   WBC 10.9 (H) 03/28/2021   HGB 15.0 03/28/2021   HCT 45.0 03/28/2021   MCV 82 03/28/2021   MCH 27.4 03/28/2021   RDW 13.4 03/28/2021   PLT 246 95/84/4171   Last metabolic panel Lab Results  Component Value Date   GLUCOSE 85 03/28/2021   NA 141 03/28/2021   K 4.4 03/28/2021   CL 108 (H) 03/28/2021   CO2 21 03/28/2021   BUN 6 03/28/2021   CREATININE 0.96 03/28/2021   EGFR 102 03/28/2021   CALCIUM 8.8 03/28/2021   PROT 5.6 (L) 03/28/2021   ALBUMIN 3.6 (L) 03/28/2021   LABGLOB 2.0 03/28/2021   AGRATIO 1.8 03/28/2021   BILITOT <0.2 03/28/2021   ALKPHOS 58 03/28/2021   AST 14  03/28/2021   ALT 13 03/28/2021   ANIONGAP 10 07/17/2020   Last lipids Lab Results  Component Value Date   CHOL 144 03/28/2021   HDL 48 03/28/2021   LDLCALC 80 03/28/2021   TRIG 83 03/28/2021   CHOLHDL 3.0 03/28/2021   Last hemoglobin A1c Lab Results  Component Value Date   HGBA1C 6.0 (H) 03/28/2021      The ASCVD Risk score (Arnett DK, et al., 2019) failed to calculate for the following reasons:   The patient has a prior MI or stroke diagnosis    Assessment & Plan:   Dental Decay The patient was given a dental application to fill out for a referral for his dental pain. He was instructed to continue his OTC regimen for pain and to notify for any worsening symptoms.  Return in about 9 weeks (around 08/14/2022), or if symptoms worsen or fail to improve.    Odelia Gage

## 2022-06-26 ENCOUNTER — Ambulatory Visit: Payer: Medicaid Other | Admitting: Gastroenterology

## 2022-06-29 ENCOUNTER — Other Ambulatory Visit: Payer: Self-pay

## 2022-07-02 ENCOUNTER — Telehealth: Payer: Self-pay | Admitting: Gastroenterology

## 2022-07-02 NOTE — Telephone Encounter (Signed)
Patient called stating that Dr Vicente Males referred him to another doctor but the patient states he has not heard anything from anybody. Patient requesting call back.

## 2022-07-06 ENCOUNTER — Other Ambulatory Visit: Payer: Self-pay

## 2022-07-06 DIAGNOSIS — K6389 Other specified diseases of intestine: Secondary | ICD-10-CM

## 2022-07-06 NOTE — Telephone Encounter (Signed)
Referral sent to general surgery so patient doesn't continue to wait to be contacted by [place].

## 2022-07-06 NOTE — Telephone Encounter (Signed)
Dr. Vicente Males, do you recall this patient? I don't have any notes on my binder about this patient. I do not recall you telling me anything about referring him somewhere. Do you recall?

## 2022-07-06 NOTE — Telephone Encounter (Signed)
Brian Rios , he was referred to see [place] for a small bowel mass, we told him that we cannot do anything with that and he would need to see a Psychologist, sport and exercise . I have Cc Iloabachie, Chioma E, NP on this.   Dr Jonathon Bellows MD,MRCP [facility]) Gastroenterology/Hepatology Pager: [phone]

## 2022-07-11 ENCOUNTER — Other Ambulatory Visit: Payer: Self-pay

## 2022-07-12 ENCOUNTER — Other Ambulatory Visit: Payer: Self-pay

## 2022-07-12 ENCOUNTER — Telehealth: Payer: Self-pay | Admitting: Emergency Medicine

## 2022-07-12 MED ORDER — INGREZZA 80 MG PO CAPS: 80.0000 mg | ORAL_CAPSULE | Freq: Every day | ORAL | 1 refills | Status: DC

## 2022-07-12 MED ORDER — MIRTAZAPINE 45 MG PO TABS
ORAL_TABLET | ORAL | 1 refills | Status: DC
  Filled 2022-07-12 – 2022-08-12 (×2): qty 30, 30d supply, fill #0
  Filled 2022-09-17: qty 30, 30d supply, fill #1

## 2022-07-12 MED ORDER — DIVALPROEX SODIUM ER 250 MG PO TB24
ORAL_TABLET | ORAL | 1 refills | Status: DC
  Filled 2022-07-12 – 2022-08-12 (×2): qty 90, 30d supply, fill #0
  Filled 2022-09-17: qty 90, 30d supply, fill #1

## 2022-07-12 MED ORDER — ESCITALOPRAM OXALATE 20 MG PO TABS
ORAL_TABLET | ORAL | 1 refills | Status: DC
  Filled 2022-07-12 – 2022-08-13 (×2): qty 30, 30d supply, fill #0
  Filled 2022-09-17: qty 30, 30d supply, fill #1

## 2022-07-12 MED ORDER — BENZTROPINE MESYLATE 1 MG PO TABS
ORAL_TABLET | ORAL | 1 refills | Status: DC
  Filled 2022-07-12 – 2022-08-14 (×2): qty 30, 30d supply, fill #0
  Filled 2022-09-17: qty 30, 30d supply, fill #1

## 2022-07-12 MED ORDER — HYDROXYZINE HCL 25 MG PO TABS
ORAL_TABLET | ORAL | 1 refills | Status: DC
  Filled 2022-07-12: qty 90, 30d supply, fill #0
  Filled 2022-09-17: qty 90, 30d supply, fill #1

## 2022-07-12 MED ORDER — ARIPIPRAZOLE 30 MG PO TABS
ORAL_TABLET | ORAL | 1 refills | Status: DC
  Filled 2022-07-12 – 2022-08-13 (×2): qty 30, 30d supply, fill #0
  Filled 2022-09-17: qty 30, 30d supply, fill #1

## 2022-07-12 NOTE — Telephone Encounter (Signed)
Received fax from [facility] requesting FASTING labs to be drawn. Called and left message for patient to call and schedule lab appointment.

## 2022-07-18 ENCOUNTER — Other Ambulatory Visit: Payer: Medicaid Other

## 2022-07-18 DIAGNOSIS — R7303 Prediabetes: Secondary | ICD-10-CM

## 2022-07-18 DIAGNOSIS — Z0000 Encounter for general adult medical examination without abnormal findings: Secondary | ICD-10-CM

## 2022-07-19 LAB — CBC WITH DIFFERENTIAL/PLATELET
Basophils Absolute: 0.1 10*3/uL (ref 0.0–0.2)
Basos: 1 %
EOS (ABSOLUTE): 0.1 10*3/uL (ref 0.0–0.4)
Eos: 1 %
Hematocrit: 42.8 % (ref 37.5–51.0)
Hemoglobin: 14.3 g/dL (ref 13.0–17.7)
Immature Grans (Abs): 0.1 10*3/uL (ref 0.0–0.1)
Immature Granulocytes: 1 %
Lymphocytes Absolute: 1.9 10*3/uL (ref 0.7–3.1)
Lymphs: 21 %
MCH: 27.3 pg (ref 26.6–33.0)
MCHC: 33.4 g/dL (ref 31.5–35.7)
MCV: 82 fL (ref 79–97)
Monocytes Absolute: 0.9 10*3/uL (ref 0.1–0.9)
Monocytes: 9 %
Neutrophils Absolute: 6.2 10*3/uL (ref 1.4–7.0)
Neutrophils: 67 %
Platelets: 229 10*3/uL (ref 150–450)
RBC: 5.23 x10E6/uL (ref 4.14–5.80)
RDW: 13.6 % (ref 11.6–15.4)
WBC: 9.3 10*3/uL (ref 3.4–10.8)

## 2022-07-19 LAB — COMPREHENSIVE METABOLIC PANEL
ALT: 21 IU/L (ref 0–44)
AST: 18 IU/L (ref 0–40)
Albumin/Globulin Ratio: 2 (ref 1.2–2.2)
Albumin: 3.9 g/dL — ABNORMAL LOW (ref 4.1–5.1)
Alkaline Phosphatase: 74 IU/L (ref 44–121)
BUN/Creatinine Ratio: 13 (ref 9–20)
BUN: 12 mg/dL (ref 6–24)
Bilirubin Total: <0.2 mg/dL (ref 0.0–1.2)
CO2: 22 mmol/L (ref 20–29)
Calcium: 8.6 mg/dL — ABNORMAL LOW (ref 8.7–10.2)
Chloride: 104 mmol/L (ref 96–106)
Creatinine, Ser: 0.95 mg/dL (ref 0.76–1.27)
Globulin, Total: 2 g/dL (ref 1.5–4.5)
Glucose: 95 mg/dL (ref 70–99)
Potassium: 4.4 mmol/L (ref 3.5–5.2)
Sodium: 138 mmol/L (ref 134–144)
Total Protein: 5.9 g/dL — ABNORMAL LOW (ref 6.0–8.5)
eGFR: 102 mL/min/{1.73_m2} (ref >59)

## 2022-07-19 LAB — LIPID PANEL
Chol/HDL Ratio: 3.1 ratio (ref 0.0–5.0)
Cholesterol, Total: 156 mg/dL (ref 100–199)
HDL: 51 mg/dL (ref >39)
LDL Chol Calc (NIH): 87 mg/dL (ref 0–99)
Triglycerides: 100 mg/dL (ref 0–149)
VLDL Cholesterol Cal: 18 mg/dL (ref 5–40)

## 2022-07-19 LAB — PROLACTIN: Prolactin: 5.9 ng/mL (ref 4.0–15.2)

## 2022-07-19 LAB — HEMOGLOBIN A1C
Est. average glucose Bld gHb Est-mCnc: 128 mg/dL
Hgb A1c MFr Bld: 6.1 % — ABNORMAL HIGH (ref 4.8–5.6)

## 2022-07-19 LAB — VALPROIC ACID LEVEL: Valproic Acid Lvl: 18 ug/mL — ABNORMAL LOW (ref 50–100)

## 2022-07-30 ENCOUNTER — Ambulatory Visit (INDEPENDENT_AMBULATORY_CARE_PROVIDER_SITE_OTHER): Payer: Self-pay | Admitting: Surgery

## 2022-07-30 ENCOUNTER — Encounter: Payer: Self-pay | Admitting: Surgery

## 2022-07-30 ENCOUNTER — Other Ambulatory Visit: Payer: Self-pay

## 2022-07-30 VITALS — BP 116/78 | HR 74 | Temp 97.7°F | Ht 70.0 in | Wt 252.8 lb

## 2022-07-30 DIAGNOSIS — R0789 Other chest pain: Secondary | ICD-10-CM

## 2022-07-30 DIAGNOSIS — K6389 Other specified diseases of intestine: Secondary | ICD-10-CM

## 2022-07-30 NOTE — Patient Instructions (Addendum)
Please go to [facility] to have your labs drawn.  Your Chest CT is scheduled for 08/14/2022 at 11 am @ [facility]. Nothing to eat or drink 4 hours prior.   Your Pelvic/Abdomen CT is scheduled for 08/14/2022 at 11 am @ [facility]. Nothing to eat or drink 4 hours prior. Please pick up contrast between now and the day before.   The EKG department will call you to schedule an  appointment.   A referral has been placed for Cardiology. They will call you with an appointment.    If you have any concerns or questions, please feel free to call our office.   Colon Mass, Adult  A colon mass is an abnormal growth in the colon, which is part of the large intestine. A mass can cause a blockage (obstruction) or bleeding in the colon. What are the causes? This condition may be caused by: Cancer. Blood vessel problems. Infection. Certain colon or bowel diseases. These include: Inflammatory bowel disease, such as ulcerative colitis or Crohn's disease. Diverticulitis. This is inflammation or infection of small pouches in the bowel. Endometriosis. This is a condition in which the lining of the uterus grows outside of the uterus. Scar tissue (adhesions) from a past surgery on the abdomen or from benign tumors such as a lipoma, teratoma, or hemangioma. Volvulus. This is twisting of the intestine. What are the signs or symptoms? Symptoms of this condition include: Cramping, nausea, diarrhea, or vomiting. A fever or feeling weak. Pain in the abdomen, side, or back. Weight loss or loss of appetite. Constipation or changes in bowel habits. Bleeding from the rectum. Feeling the need to have a bowel movement after having had one recently. In some cases, there are no symptoms of this condition. How is this diagnosed? This condition may be diagnosed based on tests and procedures that are done to learn more about the mass. These may include: Blood tests. X-rays, ultrasound, a CT scan,  or an MRI. Colonoscopy. In this procedure, a flexible tube that has oil or gel on it (is lubricated) is inserted into the opening between the buttocks (anus) and then passed into the rectum and colon to examine the areas. Biopsy. This is removal of a tissue sample from the mass to be looked at under a microscope. A biopsy may be taken during a colonoscopy. In some cases, what seems like a colon mass may actually be something else, such as scarring that formed after a surgery or an ulcer. How is this treated? Treatment for this condition depends on the cause of the mass. You and your health care provider will discuss the meaning of your test results and the recommended steps for starting treatment. If there are serious concerns, emergency surgery may be needed. Follow these instructions at home: What you need to do at home depends on the cause of the mass. Follow instructions from your health care provider. In general: Take over-the-counter and prescription medicines only as told by your health care provider. Keep all follow-up visits. This is important. Contact a health care provider if: Your symptoms get worse. You have new symptoms. You have a fever. Medicine does not help your pain. You feel weaker. You bruise or bleed easily. Get help right away if: You vomit bright red blood or material that looks like coffee grounds. You have blood in your stools (feces), or your stools look black and tarry. You faint. You feel that the mass has suddenly gotten larger. You have  severe swelling or pressure in your abdomen (bloating). Summary A colon mass is an abnormal growth in the colon, which is part of the large intestine. There are many possible causes of a colon mass. Treatment depends on the cause of the mass. If there are serious concerns, emergency surgery may be needed. Take over-the-counter and prescription medicines only as told by your health care provider. This information is not  intended to replace advice given to you by your health care provider. Make sure you discuss any questions you have with your health care provider. Document Revised: 01/27/2020 Document Reviewed: 01/27/2020 Elsevier Patient Education  [place].

## 2022-07-31 ENCOUNTER — Telehealth: Payer: Self-pay | Admitting: Surgery

## 2022-07-31 ENCOUNTER — Other Ambulatory Visit: Payer: Self-pay

## 2022-07-31 DIAGNOSIS — R0789 Other chest pain: Secondary | ICD-10-CM

## 2022-07-31 NOTE — Telephone Encounter (Signed)
Left message for patient to call.  Left detailed information regarding EKG that is scheduled for Friday August 03, 2022 patient to arrive at 3:30 pm at the [facility].

## 2022-08-01 NOTE — Progress Notes (Signed)
Patient ID: Brian Rios, male   DOB: [birth year], 41 y.o.   MRN: [id number]  HPI Brian Rios is a 41 y.o. male seen in consultation at the request of Dr. Orvil Feil total small bowel tumor patient does have significant psychiatric history and has failed to follow-up with multiple doctors appointments.  There is significant social barriers. Does have a history of polysubstance abuse, history of embolic strokes.  He reports some residual right-sided weakness and occasional speech difficulties.  He smokes about a pack a day drain today.  Also admits some marijuana.  I think he lives but she is not present at this time.  He did have a CT scan last year that have personally reviewed showing evidence of small bowel mass quadrant as well as some infiltration into the mesentery.  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  There are some mesenteric implants as well. CBC and CMP nml.  He does have dyspnea on exertion.  Continue weight loss no nausea no vomiting no abdominal symptoms.  No prior abdominal operations.  HPI  Past Medical History:  Diagnosis Date   Alcohol abuse    Depression    Drug abuse ([place])    Tobacco abuse     Past Surgical History:  Procedure Laterality Date   ORIF ORBITAL FRACTURE     TEE WITHOUT CARDIOVERSION N/A 07/19/2020   Procedure: TRANSESOPHAGEAL ECHOCARDIOGRAM (TEE);  Surgeon: Corey Skains, MD;  Location: [facility] ORS;  Service: Cardiovascular;  Laterality: N/A;    Family History  Problem Relation Age of Onset   Alcoholism Mother    Hypertension Mother    Hyperlipidemia Mother    Breast cancer Mother    Alcoholism Father    Diabetes Maternal Grandmother     Social History Social History   Tobacco Use   Smoking status: Every Day    Packs/day: 1.00    Years: 15.00    Total pack years: 15.00    Types: Cigarettes   Smokeless tobacco: Former    Quit date: 03/09/2003  Vaping Use   Vaping Use: Never used  Substance Use Topics    Alcohol use: Yes    Alcohol/week: 6.0 standard drinks of alcohol    Types: 6 Cans of beer per week   Drug use: Not Currently    Types: "Crack" cocaine    Comment: last use 2016    No Known Allergies  Current Outpatient Medications  Medication Sig Dispense Refill   ARIPiprazole (ABILIFY) 30 MG tablet 1 po q hs 30 tablet 2   ARIPiprazole (ABILIFY) 30 MG tablet 1 po q hs 30 tablet 1   ARIPiprazole ER (ABILIFY MAINTENA) 400 MG PRSY prefilled syringe Inject 400 mg into the muscle every 30 (thirty) days. Last ~3 weeks ago (noted 3/9)     atorvastatin (LIPITOR) 40 MG tablet TAKE ONE TABLET BY MOUTH ONCE EVERY DAY. 30 tablet 2   benztropine (COGENTIN) 0.5 MG tablet Take 2 tablets by mouth at beddtime 60 tablet 2   benztropine (COGENTIN) 1 MG tablet Take 1 tablet (1 mg total) by mouth once daily at bedtime. 30 tablet 2   benztropine (COGENTIN) 1 MG tablet 1 po q hs 30 tablet 1   divalproex (DEPAKOTE ER) 250 MG 24 hr tablet 3 po q hs 90 tablet 2   divalproex (DEPAKOTE ER) 250 MG 24 hr tablet 3 po q hs 90 tablet 1   escitalopram (LEXAPRO) 20 MG tablet TAKE ONE TABLET BY MOUTH ONCE DAILY. 30 tablet 2  escitalopram (LEXAPRO) 20 MG tablet 1 po q day 30 tablet 2   escitalopram (LEXAPRO) 20 MG tablet 1 po q day 30 tablet 1   hydrOXYzine (ATARAX) 25 MG tablet Take 3 tablets (75 mg total) by mouth once daily at bedtime as needed. (Patient taking differently: Take 75 mg by mouth at bedtime as needed. Needs ~1 x per day (takes at nighttime)) 90 tablet 2   hydrOXYzine (ATARAX) 25 MG tablet 3 po q hs prn 90 tablet 1   metFORMIN (GLUCOPHAGE) 1000 MG tablet Take (1/2) tablet (500 mg total) by mouth once daily with breakfast. 30 tablet 3   mirtazapine (REMERON) 45 MG tablet 1 po q hs 30 tablet 2   mirtazapine (REMERON) 45 MG tablet 1 po q hs 30 tablet 1   tadalafil (CIALIS) 5 MG tablet Take 2-4 tablets (10-20 mg total) by mouth once daily as needed for erectile dysfunction. (Patient taking differently: Take  10-20 mg by mouth daily as needed for erectile dysfunction. Once a day) 30 tablet 11   valbenazine (INGREZZA) 80 MG capsule Take 1 capsule (80 mg total) by mouth once daily at bedtime. (Patient taking differently: Take 80 mg by mouth at bedtime. Pt receives delivery) 30 capsule 2   valbenazine (INGREZZA) 80 MG capsule Take one capsule by mouth at bedtime. 30 capsule 2   valbenazine (INGREZZA) 80 MG capsule Take one capsule by mouth at bedtime. 30 capsule 1   No current facility-administered medications for this visit.     Review of Systems Full ROS  was asked and was negative except for the information on the HPI  Physical Exam Blood pressure 116/78, pulse 74, temperature 97.7 F (36.5 C), temperature source Oral, height '5\' 10"'$  (1.778 m), weight 252 lb 12.8 oz (114.7 kg), SpO2 96 %. CONSTITUTIONAL: NAD. EYES: Pupils are equal, round, and reactive to light, Sclera are non-icteric. EARS, NOSE, MOUTH AND THROAT: The oropharynx is clear. The oral mucosa is pink and moist. Hearing is intact to voice. LYMPH NODES:  Lymph nodes in the neck are normal. RESPIRATORY:  Lungs are clear. There is normal respiratory effort, with equal breath sounds bilaterally, and without pathologic use of accessory muscles. CARDIOVASCULAR: Heart is regular without murmurs, gallops, or rubs. GI: The abdomen is  soft, nontender, and nondistended. There are no palpable masses. There is no hepatosplenomegaly. There are normal bowel sounds in all quadrants. GU: Rectal deferred.   MUSCULOSKELETAL: Normal muscle strength and tone. No cyanosis or edema.   SKIN: Turgor is good and there are no pathologic skin lesions or ulcers. NEUROLOGIC: Motor and sensation is grossly normal. Cranial nerves are grossly intact. PSYCH:  Oriented to person, place and time. Affect is normal.  Data Reviewed  I have personally reviewed the patient's imaging, laboratory findings and medical records.    Assessment/Plan 41 year old male with  significant psychiatric history consisting tumor.  Given that the CT scan is from last year I do want current CT scan to delineate intra-abdominal anatomy. We will obtain full laboratory evaluation as well as an EKG. Cards will be consulted as well for preop optimization.  He will likely require resection I would like to establish before any surgical intervention is attempted. He does have significant.  That limits his follow-up.  Encouraged him to bring his mother to the next.  Patient has transportation issues. Please note that I spent at least 60 minutes in this personally reviewing imaging studies, coordinating his care, placing orders and performing appropriate documentation. A copy  of this repost was sent to the referring provider.   Caroleen Hamman, MD FACS General Surgeon 08/01/2022, 3:50 PM

## 2022-08-03 ENCOUNTER — Ambulatory Visit
Admission: RE | Admit: 2022-08-03 | Discharge: 2022-08-03 | Disposition: A | Discharge status: Home / Self Care | Payer: Medicaid Other | Source: Ambulatory Visit | Attending: Surgery | Admitting: Surgery

## 2022-08-03 DIAGNOSIS — R0789 Other chest pain: Secondary | ICD-10-CM | POA: Insufficient documentation

## 2022-08-03 DIAGNOSIS — R9431 Abnormal electrocardiogram [ECG] [EKG]: Secondary | ICD-10-CM | POA: Insufficient documentation

## 2022-08-10 ENCOUNTER — Other Ambulatory Visit: Payer: Self-pay

## 2022-08-12 ENCOUNTER — Other Ambulatory Visit: Payer: Self-pay

## 2022-08-13 ENCOUNTER — Other Ambulatory Visit: Payer: Self-pay

## 2022-08-14 ENCOUNTER — Ambulatory Visit
Admission: RE | Admit: 2022-08-14 | Discharge: 2022-08-14 | Disposition: A | Discharge status: Home / Self Care | Payer: Medicaid Other | Source: Ambulatory Visit | Attending: Surgery | Admitting: Surgery

## 2022-08-14 ENCOUNTER — Ambulatory Visit: Payer: Medicaid Other | Admitting: Gerontology

## 2022-08-14 ENCOUNTER — Other Ambulatory Visit: Payer: Self-pay

## 2022-08-14 DIAGNOSIS — K6389 Other specified diseases of intestine: Secondary | ICD-10-CM

## 2022-08-14 DIAGNOSIS — R0789 Other chest pain: Secondary | ICD-10-CM

## 2022-08-14 MED ORDER — IOHEXOL 300 MG/ML  SOLN
100.0000 mL | Freq: Once | INTRAMUSCULAR | Status: AC | PRN
  Administered 2022-08-14: 100 mL via INTRAVENOUS

## 2022-08-14 MED ORDER — IOHEXOL 9 MG/ML PO SOLN
500.0000 mL | ORAL | Status: AC
  Administered 2022-08-14 (×2): 500 mL via ORAL

## 2022-08-16 ENCOUNTER — Telehealth: Payer: Self-pay | Admitting: *Deleted

## 2022-08-16 ENCOUNTER — Telehealth: Payer: Self-pay

## 2022-08-16 DIAGNOSIS — K6389 Other specified diseases of intestine: Secondary | ICD-10-CM

## 2022-08-16 NOTE — Telephone Encounter (Signed)
Patient called back and is aware if his PET scan appointment and office visit

## 2022-08-16 NOTE — Telephone Encounter (Signed)
Patient will call back once he arrives home to write appointment information down.   Pet scan scheduled 08/28/2022 @ 12:30 @ [facility]. Do not eat 2 hours prior to this scan. You m,ay only have PLAIN water to drink. No gum, no mouth mints. Please wear clothing w/o metal.   Dr.Pabon 08/29/2022 @ 10:30 to discuss results.

## 2022-08-28 ENCOUNTER — Ambulatory Visit
Admission: RE | Admit: 2022-08-28 | Discharge: 2022-08-28 | Disposition: A | Discharge status: Home / Self Care | Payer: Medicaid Other | Source: Ambulatory Visit | Attending: Surgery | Admitting: Surgery

## 2022-08-28 ENCOUNTER — Other Ambulatory Visit: Payer: Self-pay | Admitting: Surgery

## 2022-08-28 DIAGNOSIS — K6389 Other specified diseases of intestine: Secondary | ICD-10-CM | POA: Insufficient documentation

## 2022-08-28 DIAGNOSIS — R933 Abnormal findings on diagnostic imaging of other parts of digestive tract: Secondary | ICD-10-CM | POA: Insufficient documentation

## 2022-08-28 MED ORDER — COPPER CU 64 DOTATATE 1 MCI/ML IV SOLN
4.0000 | Freq: Once | INTRAVENOUS | Status: AC
  Administered 2022-08-28: 4.14 via INTRAVENOUS

## 2022-08-29 ENCOUNTER — Ambulatory Visit (INDEPENDENT_AMBULATORY_CARE_PROVIDER_SITE_OTHER): Payer: Self-pay | Admitting: Surgery

## 2022-08-29 ENCOUNTER — Telehealth: Payer: Self-pay | Admitting: Surgery

## 2022-08-29 ENCOUNTER — Other Ambulatory Visit: Payer: Self-pay

## 2022-08-29 ENCOUNTER — Encounter: Payer: Self-pay | Admitting: Surgery

## 2022-08-29 VITALS — BP 109/71 | HR 80 | Temp 97.8°F | Wt 238.0 lb

## 2022-08-29 DIAGNOSIS — K6389 Other specified diseases of intestine: Secondary | ICD-10-CM

## 2022-08-29 MED ORDER — METRONIDAZOLE 500 MG PO TABS
ORAL_TABLET | ORAL | 0 refills | Status: DC
  Filled 2022-08-29: qty 6, 1d supply, fill #0

## 2022-08-29 MED ORDER — NEOMYCIN SULFATE 500 MG PO TABS
ORAL_TABLET | ORAL | 0 refills | Status: DC
  Filled 2022-08-29: qty 6, 1d supply, fill #0

## 2022-08-29 MED ORDER — POLYETHYLENE GLYCOL 3350 17 GM/SCOOP PO POWD
ORAL | 0 refills | Status: DC
  Filled 2022-08-29: qty 238, 1d supply, fill #0

## 2022-08-29 NOTE — Patient Instructions (Signed)
Our surgery scheduler Pamala Hurry will call you within 24-48 hours to get you scheduled. If you have not heard from her after 48 hours, please call our office. Have the blue sheet available when she calls to write down important information.  If you have any concerns or questions, please feel free to call our office.    Minimally Invasive Small Bowel Resection  The small bowel, also called the small intestine, is the first part of the intestines. When food leaves the stomach, it enters the small bowel. Nutrients can then be absorbed into the body. Minimally invasive small bowel resection is surgery to remove part of the small bowel. You may need this procedure if you have: Blockage (obstruction). Inflammatory bowel disease, such as Crohn's disease. Cancer. A hole in the bowel (perforation). An injury to the bowel (trauma). Tell your health care provider about: Any allergies you have. All medicines you are taking, including vitamins, herbs, eye drops, creams, and over-the-counter medicines. Any problems you or family members have had with anesthesia. Any bleeding problems you have. Any surgeries you have had. Any medical conditions you have. Whether you are pregnant or may be pregnant. What are the risks? Your health care provider will talk with you about risks. These may include: Infection. Bleeding. Allergic reactions to medicines. Damage to nearby structures or organs. Problems with the bowel, such as: Fluids leaking from the intestines into the abdomen. A long delay before the bowels start to work again (ileus). Not absorbing enough vitamins and nutrients through the small bowel. Short bowel syndrome. This is when there is not enough of the small bowel left to absorb nutrients. Pulmonary embolism. This is a blood clot that forms in the veins and travels to the lungs. Hernia. This is when the abdomen bulges out. It may require surgery in the future. Scarring around an incision or  inside your body. If scarring occurs inside the body, you may need surgery. What happens before the procedure? When to stop eating and drinking Follow instructions from your health care provider about what you may eat and drink. These may include: 8 hours before your procedure Stop eating most foods. Do not eat meat, fried foods, or fatty foods. Eat only light foods, such as toast or crackers. All liquids are okay except energy drinks and alcohol. 6 hours before your procedure Stop eating. Drink only clear liquids, such as water, clear fruit juice, black coffee, plain tea, and sports drinks. Do not drink energy drinks or alcohol. 2 hours before your procedure Stop drinking all liquids. You may be allowed to take medicines with small sips of water. If you do not follow your health care provider's instructions, your procedure may be delayed or canceled. Medicines Ask your health care provider about: Changing or stopping your regular medicines. These include any diabetes medicines or blood thinners you take. Taking medicines such as aspirin and ibuprofen. These medicines can thin your blood. Do not take them unless your health care provider tells you to. Taking over-the-counter medicines, vitamins, herbs, and supplements. You may need to take medicine to clean out your bowels before surgery (bowel prep). Take the medicine as told by your health care provider. Tests You may have an exam or testing. Tests may include: Blood tests. Imaging tests, such as X-rays, CT scan, or MRI. Surgery safety Ask your health care provider: How your surgery site will be marked. What steps will be taken to help prevent infection. These steps may include: Removing hair at the surgery site.  Washing skin with a soap that kills germs. Taking antibiotics. General instructions Do not use any products that contain nicotine or tobacco for at least 4 weeks before the procedure. These products include cigarettes,  chewing tobacco, and vaping devices, such as e-cigarettes. If you need help quitting, ask your health care provider. Plan to have a responsible adult: Take you home from the hospital. You will not be allowed to drive. Care for you for the time you are told. What happens during the procedure?  An IV will be inserted into one of your veins. You may be given: A sedative. This helps you relax. Anesthesia. This will: Numb certain areas of your body. Make you fall asleep for surgery. Tubes may be put into your body, such as: A tube in your throat to help you breathe. A nasogastric tube (NG tube). This goes through your nose and into your stomach. Fluids from your stomach will drain through this tube during and after the procedure. A small, thin tube (catheter) put into your bladder to drain urine. A few small incisions will be made in your abdomen. Hollow tubes called trocars will be inserted into the incisions. A scope with a light and camera on the end (laparoscope) will be inserted into one of the trocars. This can be used to send pictures to a screen. Your abdomen will be filled with gas. This gives your surgeon room to see and work. The tools needed for the procedure will be inserted through the other trocars. A surgical robot may be used to control the laparoscope and tools (robot-assisted laparoscopic procedure). The affected part of the small bowel will be removed. The two healthy ends of the small bowel will be connected with staples or stitches (sutures). If the ends cannot be joined, you may need an ileostomy. This is when an opening (stoma) is made in your abdomen for stool to pass through. The incisions will be closed with sutures, skin glue, or adhesive strips and may be covered with bandages (dressings). The procedure may vary among health care providers and hospitals. What happens after the procedure? Your blood pressure, heart rate, breathing rate, and blood oxygen level will be  monitored until you leave the hospital. You may be given fluids and medicine through an IV. The NG tube will be removed after your bowels start working. Then you can start to have liquids and slowly move to more solid foods. You should get up and start walking within a day. This helps prevent blood clots in your legs. To help prevent breathing problems: You may be told to breathe deeply and cough several times a day. You may be given a device (incentive spirometer) to help you do breathing exercises. You may be told to wear compression stockings. These help prevent blood clots and reduce swelling in your legs. This information is not intended to replace advice given to you by your health care provider. Make sure you discuss any questions you have with your health care provider. Document Revised: 03/09/2022 Document Reviewed: 03/09/2022 Elsevier Patient Education  [place].

## 2022-08-29 NOTE — H&P (View-Only) (Signed)
Outpatient Surgical Follow Up  08/29/2022  Brian Rios is an 41 y.o. male.   Chief Complaint  Patient presents with   Follow-up    Small bowel tumor    HPI: Brian Rios is a 41 y.o. male following up for small bowel tumor patient does have significant psychiatric history and has failed to follow-up with multiple doctors appointments.  There is significant social barriers.   He smokes about a pack a day drain today.  Also admits some marijuana.    He did have a CT scan last year that have personally reviewed showing evidence of small bowel mass quadrant as well as some infiltration into the mesentery.  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 I also ordered DOTATATE scan and findings were not consistent with neuroendocrine tumor.  Differential will include lymphoma , adenoca or Meckle.  CBC and CMP nml.  .  Continue weight loss no nausea no vomiting no abdominal symptoms.  No prior abdominal operations  Past Medical History:  Diagnosis Date   Alcohol abuse    Depression    Drug abuse ([place])    Tobacco abuse     Past Surgical History:  Procedure Laterality Date   ORIF ORBITAL FRACTURE     TEE WITHOUT CARDIOVERSION N/A 07/19/2020   Procedure: TRANSESOPHAGEAL ECHOCARDIOGRAM (TEE);  Surgeon: Corey Skains, MD;  Location: [facility] ORS;  Service: Cardiovascular;  Laterality: N/A;    Family History  Problem Relation Age of Onset   Alcoholism Mother    Hypertension Mother    Hyperlipidemia Mother    Breast cancer Mother    Alcoholism Father    Diabetes Maternal Grandmother     Social History:  reports that he has been smoking cigarettes. He has a 15.00 pack-year smoking history. He quit smokeless tobacco use about 19 years ago. He reports current alcohol use of about 6.0 standard drinks of alcohol per week. He reports that he does not currently use drugs after having used the following drugs: "Crack" cocaine.  Allergies: No Known  Allergies  Medications reviewed.    ROS Full ROS performed and is otherwise negative other than what is stated in HPI   BP 109/71   Pulse 80   Temp 97.8 F (36.6 C) (Oral)   Wt 238 lb (108 kg)   SpO2 97%   BMI 34.15 kg/m   Physical Exam CONSTITUTIONAL: NAD. EYES: Pupils are equal, round, and reactive to light, Sclera are non-icteric. EARS, NOSE, MOUTH AND THROAT: The oropharynx is clear. The oral mucosa is pink and moist. Hearing is intact to voice. LYMPH NODES:  Lymph nodes in the neck are normal. RESPIRATORY:  Lungs are clear. There is normal respiratory effort, with equal breath sounds bilaterally, and without pathologic use of accessory muscles. CARDIOVASCULAR: Heart is regular without murmurs, gallops, or rubs. GI: The abdomen is  soft, nontender, and nondistended. There are no palpable masses. There is no hepatosplenomegaly. There are normal bowel sounds in all quadrants. GU: Rectal deferred.   MUSCULOSKELETAL: Normal muscle strength and tone. No cyanosis or edema.   SKIN: Turgor is good and there are no pathologic skin lesions or ulcers. NEUROLOGIC: Motor and sensation is grossly normal. Cranial nerves are grossly intact. PSYCH:  Oriented to person, place and time. Affect is normal.    No results found for this or any previous visit (from the past 48 hour(s)). NM PET DOTATATE SKULL BASE TO MID THIGH  Result Date: 08/28/2022 CLINICAL DATA:  Insert treatment strategy for  distal small bowel mass in the low signal mesentery. EXAM: NUCLEAR MEDICINE PET SKULL BASE TO THIGH TECHNIQUE: 4.1 mCi copper 74 DOTATATE was injected intravenously. Full-ring PET imaging was performed from the skull base to thigh after the radiotracer. CT data was obtained and used for attenuation correction and anatomic localization. COMPARISON:  None Available. FINDINGS: NECK No radiotracer activity in neck lymph nodes. Incidental CT findings: None CHEST No radiotracer accumulation within mediastinal or  hilar lymph nodes. No suspicious pulmonary nodules on the CT scan. Incidental CT finding:None ABDOMEN/PELVIS Soft tissue mass again demonstrated in the RIGHT lower quadrant mesentery measuring 3.2 x 2.8 cm in axial dimension (image 199/series 2). This mass has no associated radiotracer activity. The mass is immediately associated distal small bowel. There is smal focus of gas along the caudal margin of the mass which is similar comparison CT. Elsewhere in the abdomen and pelvis, there is no radiotracer avid mesenteric implants or lymph nodes. No abnormal radiotracer activity associated the small bowel. No abnormal radiotracer activity within the liver. Physiologic activity noted in the liver, spleen, adrenal glands and kidneys. Incidental CT findings:None SKELETON No focal activity to suggest skeletal metastasis. Incidental CT findings:None IMPRESSION: 1. No radiotracer activity associated with the RIGHT lower quadrant soft tissue mass. Findings are NOT consistent with well differentiated neuroendocrine tumor. 2. Differential considerations for soft tissue mass would include small bowel neoplasm, lymphoproliferative disease or Meckel's diverticulum. Electronically Signed   By: Suzy Bouchard M.D.   On: 08/28/2022 15:04    Assessment/Plan: 41 year old male with mass in the terminal ileum concerning for neoplastic process.  GIST versus adenocarcinoma versus neuroendocrine tumor certainly within the differential. Doing concerning characteristics I definitely recommend resection.  Discussed with the patient in detail about his disease process and he is in agreement.  Due to the proximity to the ileocecal valve I have also discussed with him the possibility of potential right colectomy.  Procedure discussed with the patient in detail.  Risks, benefits and possible complications including but not limited to: Bleeding, infection injury to adjacent structures, need for ostomy and anastomotic leak.  I do think that  given his disease process and potential for cancer and being neuroendocrine he is better served with exploratory laparotomy rather than an minimal invasive approach.  The open approach will Alateris to check a range of the small bowel for potential synchronous lesions. He is in agreement.  We will plan to schedule him for next week.  We will do bowel prep. Please note that I spent 40 minutes in this personally reviewing imaging studies, coordinating his care, placing orders and performing appropriate documentation.     Caroleen Hamman, MD FACS

## 2022-08-29 NOTE — Progress Notes (Signed)
Outpatient Surgical Follow Up  08/29/2022  Brian Rios is an 41 y.o. male.   Chief Complaint  Patient presents with   Follow-up    Small bowel tumor    HPI: Brian Rios is a 41 y.o. male following up for small bowel tumor patient does have significant psychiatric history and has failed to follow-up with multiple doctors appointments.  There is significant social barriers.   He smokes about a pack a day drain today.  Also admits some marijuana.    He did have a CT scan last year that have personally reviewed showing evidence of small bowel mass quadrant as well as some infiltration into the mesentery.  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 I also ordered DOTATATE scan and findings were not consistent with neuroendocrine tumor.  Differential will include lymphoma , adenoca or Meckle.  CBC and CMP nml.  .  Continue weight loss no nausea no vomiting no abdominal symptoms.  No prior abdominal operations  Past Medical History:  Diagnosis Date   Alcohol abuse    Depression    Drug abuse ([place])    Tobacco abuse     Past Surgical History:  Procedure Laterality Date   ORIF ORBITAL FRACTURE     TEE WITHOUT CARDIOVERSION N/A 07/19/2020   Procedure: TRANSESOPHAGEAL ECHOCARDIOGRAM (TEE);  Surgeon: Corey Skains, MD;  Location: [facility] ORS;  Service: Cardiovascular;  Laterality: N/A;    Family History  Problem Relation Age of Onset   Alcoholism Mother    Hypertension Mother    Hyperlipidemia Mother    Breast cancer Mother    Alcoholism Father    Diabetes Maternal Grandmother     Social History:  reports that he has been smoking cigarettes. He has a 15.00 pack-year smoking history. He quit smokeless tobacco use about 19 years ago. He reports current alcohol use of about 6.0 standard drinks of alcohol per week. He reports that he does not currently use drugs after having used the following drugs: "Crack" cocaine.  Allergies: No Known  Allergies  Medications reviewed.    ROS Full ROS performed and is otherwise negative other than what is stated in HPI   BP 109/71   Pulse 80   Temp 97.8 F (36.6 C) (Oral)   Wt 238 lb (108 kg)   SpO2 97%   BMI 34.15 kg/m   Physical Exam CONSTITUTIONAL: NAD. EYES: Pupils are equal, round, and reactive to light, Sclera are non-icteric. EARS, NOSE, MOUTH AND THROAT: The oropharynx is clear. The oral mucosa is pink and moist. Hearing is intact to voice. LYMPH NODES:  Lymph nodes in the neck are normal. RESPIRATORY:  Lungs are clear. There is normal respiratory effort, with equal breath sounds bilaterally, and without pathologic use of accessory muscles. CARDIOVASCULAR: Heart is regular without murmurs, gallops, or rubs. GI: The abdomen is  soft, nontender, and nondistended. There are no palpable masses. There is no hepatosplenomegaly. There are normal bowel sounds in all quadrants. GU: Rectal deferred.   MUSCULOSKELETAL: Normal muscle strength and tone. No cyanosis or edema.   SKIN: Turgor is good and there are no pathologic skin lesions or ulcers. NEUROLOGIC: Motor and sensation is grossly normal. Cranial nerves are grossly intact. PSYCH:  Oriented to person, place and time. Affect is normal.    No results found for this or any previous visit (from the past 48 hour(s)). NM PET DOTATATE SKULL BASE TO MID THIGH  Result Date: 08/28/2022 CLINICAL DATA:  Insert treatment strategy for  distal small bowel mass in the low signal mesentery. EXAM: NUCLEAR MEDICINE PET SKULL BASE TO THIGH TECHNIQUE: 4.1 mCi copper 70 DOTATATE was injected intravenously. Full-ring PET imaging was performed from the skull base to thigh after the radiotracer. CT data was obtained and used for attenuation correction and anatomic localization. COMPARISON:  None Available. FINDINGS: NECK No radiotracer activity in neck lymph nodes. Incidental CT findings: None CHEST No radiotracer accumulation within mediastinal or  hilar lymph nodes. No suspicious pulmonary nodules on the CT scan. Incidental CT finding:None ABDOMEN/PELVIS Soft tissue mass again demonstrated in the RIGHT lower quadrant mesentery measuring 3.2 x 2.8 cm in axial dimension (image 199/series 2). This mass has no associated radiotracer activity. The mass is immediately associated distal small bowel. There is smal focus of gas along the caudal margin of the mass which is similar comparison CT. Elsewhere in the abdomen and pelvis, there is no radiotracer avid mesenteric implants or lymph nodes. No abnormal radiotracer activity associated the small bowel. No abnormal radiotracer activity within the liver. Physiologic activity noted in the liver, spleen, adrenal glands and kidneys. Incidental CT findings:None SKELETON No focal activity to suggest skeletal metastasis. Incidental CT findings:None IMPRESSION: 1. No radiotracer activity associated with the RIGHT lower quadrant soft tissue mass. Findings are NOT consistent with well differentiated neuroendocrine tumor. 2. Differential considerations for soft tissue mass would include small bowel neoplasm, lymphoproliferative disease or Meckel's diverticulum. Electronically Signed   By: Suzy Bouchard M.D.   On: 08/28/2022 15:04    Assessment/Plan: 41 year old male with mass in the terminal ileum concerning for neoplastic process.  GIST versus adenocarcinoma versus neuroendocrine tumor certainly within the differential. Doing concerning characteristics I definitely recommend resection.  Discussed with the patient in detail about his disease process and he is in agreement.  Due to the proximity to the ileocecal valve I have also discussed with him the possibility of potential right colectomy.  Procedure discussed with the patient in detail.  Risks, benefits and possible complications including but not limited to: Bleeding, infection injury to adjacent structures, need for ostomy and anastomotic leak.  I do think that  given his disease process and potential for cancer and being neuroendocrine he is better served with exploratory laparotomy rather than an minimal invasive approach.  The open approach will Alateris to check a range of the small bowel for potential synchronous lesions. He is in agreement.  We will plan to schedule him for next week.  We will do bowel prep. Please note that I spent 40 minutes in this personally reviewing imaging studies, coordinating his care, placing orders and performing appropriate documentation.     Caroleen Hamman, MD FACS

## 2022-08-29 NOTE — Telephone Encounter (Signed)
Patient has been advised of Pre-Admission date/time, and Surgery date at [facility].  Surgery Date: 09/04/22 Preadmission Testing Date: 09/03/22 (phone 8a-1p)  Patient has been made aware to call [phone], between 1-3:00pm the day before surgery, to find out what time to arrive for surgery.

## 2022-09-03 ENCOUNTER — Encounter
Admission: RE | Admit: 2022-09-03 | Discharge: 2022-09-03 | Disposition: A | Discharge status: Home / Self Care | Payer: Medicaid Other | Source: Ambulatory Visit | Attending: Surgery | Admitting: Surgery

## 2022-09-03 VITALS — Ht 70.0 in | Wt 238.0 lb

## 2022-09-03 DIAGNOSIS — Z01812 Encounter for preprocedural laboratory examination: Secondary | ICD-10-CM

## 2022-09-03 HISTORY — DX: Schizophrenia, unspecified: F20.9

## 2022-09-03 HISTORY — DX: Male erectile dysfunction, unspecified: N52.9

## 2022-09-03 HISTORY — DX: Cardiac murmur, unspecified: R01.1

## 2022-09-03 HISTORY — DX: Hyperlipidemia, unspecified: E78.5

## 2022-09-03 NOTE — Patient Instructions (Addendum)
Your procedure is scheduled on: Tuesday, November 14 Report to the Registration Desk on the 1st floor of the [facility]. To find out your arrival time, please call [phone] between 1PM - 3PM on: Monday, November 13 If your arrival time is 6:00 am, do not arrive prior to that time as the [facility] entrance doors do not open until 6:00 am.  REMEMBER: Instructions that are not followed completely may result in serious medical risk, up to and including death; or upon the discretion of your surgeon and anesthesiologist your surgery may need to be rescheduled.  Do not eat or drink after midnight the night before surgery.  No gum chewing, lozengers or hard candies.  FOLLOW DIET AND BOWEL INSTRUCTIONS GIVEN TO YOU BY DR. Dahlia Byes.  DO NOT TAKE ANY MEDICATIONS THE MORNING OF SURGERY   One week prior to surgery: Stop Anti-inflammatories (NSAIDS) such as Advil, Aleve, Ibuprofen, Motrin, Naproxen, Naprosyn and Aspirin based products such as Excedrin, Goodys Powder, BC Powder. Stop ANY OVER THE COUNTER supplements until after surgery. You may however, continue to take Tylenol if needed for pain up until the day of surgery.  No Alcohol for 24 hours before or after surgery.  No Smoking including e-cigarettes for 24 hours prior to surgery.  No chewable tobacco products for at least 6 hours prior to surgery.  No nicotine patches on the day of surgery.  Do not use any "recreational" drugs for at least a week prior to your surgery.  Please be advised that the combination of cocaine and anesthesia may have negative outcomes, up to and including death. If you test positive for cocaine, your surgery will be cancelled.  On the morning of surgery brush your teeth with toothpaste and water, you may rinse your mouth with mouthwash if you wish. Do not swallow any toothpaste or mouthwash.  SHOWER USING ANTIBACTERIAL SOAP BEFORE ARRIVING TO [facility] ON THE DAY OF SURGERY.  Do not wear jewelry,  make-up, hairpins, clips or nail polish.  Do not wear lotions, powders, or perfumes.   Do not shave body from the neck down 48 hours prior to surgery just in case you cut yourself which could leave a site for infection.   Contact lenses, hearing aids and dentures may not be worn into surgery.  Do not bring valuables to the hospital. [facility] is not responsible for any missing/lost belongings or valuables.   Notify your doctor if there is any change in your medical condition (cold, fever, infection).  Wear comfortable clothing (specific to your surgery type) to the hospital.  After surgery, you can help prevent lung complications by doing breathing exercises.  Take deep breaths and cough every 1-2 hours. Your doctor may order a device called an Incentive Spirometer to help you take deep breaths. When coughing or sneezing, hold a pillow firmly against your incision with both hands. This is called "splinting." Doing this helps protect your incision. It also decreases belly discomfort.  If you are being admitted to the hospital overnight, leave your suitcase in the car. After surgery it may be brought to your room.  Please call the [employer] Dept. at [phone] if you have any questions about these instructions.  Surgery Visitation Policy:  Patients undergoing a surgery or procedure may have two family members or support persons with them as long as the person is not COVID-19 positive or experiencing its symptoms.   Inpatient Visitation:    Visiting hours are 7 a.m. to 8  p.m. Up to four visitors are allowed at one time in a patient room. The visitors may rotate out with other people during the day. One designated support person (adult) may remain overnight.  MASKING: Due to an increase in RSV rates and hospitalizations, starting Wednesday, Nov. 15, in patient care areas in which we serve newborns, infants and children, masks will be required for teammates and  visitors.  Children ages 46 and under may not visit. This policy affects the following departments only:  [facility] area Mother Baby Unit Newborn nursery/Special care nursery  Other areas: Masks continue to be strongly recommended for [place] teammates, visitors and patients in all other areas. Visitation is not restricted outside of the units listed above.

## 2022-09-04 ENCOUNTER — Inpatient Hospital Stay: Payer: Self-pay | Admitting: Anesthesiology

## 2022-09-04 ENCOUNTER — Encounter: Payer: Self-pay | Admitting: Surgery

## 2022-09-04 ENCOUNTER — Encounter
Admission: RE | Disposition: A | Discharge status: Home / Self Care | Payer: Self-pay | Source: Home / Self Care | Attending: Surgery

## 2022-09-04 ENCOUNTER — Inpatient Hospital Stay
Admission: RE | Admit: 2022-09-04 | Discharge: 2022-09-07 | DRG: 331 | Disposition: A | Discharge status: Home / Self Care | Payer: Self-pay | Attending: Surgery | Admitting: Surgery

## 2022-09-04 ENCOUNTER — Other Ambulatory Visit: Payer: Self-pay

## 2022-09-04 ENCOUNTER — Ambulatory Visit: Payer: Self-pay | Admitting: Gerontology

## 2022-09-04 DIAGNOSIS — Z01812 Encounter for preprocedural laboratory examination: Secondary | ICD-10-CM

## 2022-09-04 DIAGNOSIS — D122 Benign neoplasm of ascending colon: Principal | ICD-10-CM | POA: Diagnosis present

## 2022-09-04 DIAGNOSIS — F17210 Nicotine dependence, cigarettes, uncomplicated: Secondary | ICD-10-CM | POA: Diagnosis present

## 2022-09-04 DIAGNOSIS — Z811 Family history of alcohol abuse and dependence: Secondary | ICD-10-CM

## 2022-09-04 DIAGNOSIS — K6389 Other specified diseases of intestine: Secondary | ICD-10-CM

## 2022-09-04 DIAGNOSIS — D1339 Benign neoplasm of other parts of small intestine: Secondary | ICD-10-CM | POA: Diagnosis present

## 2022-09-04 DIAGNOSIS — Z9049 Acquired absence of other specified parts of digestive tract: Principal | ICD-10-CM

## 2022-09-04 DIAGNOSIS — D72829 Elevated white blood cell count, unspecified: Secondary | ICD-10-CM | POA: Diagnosis not present

## 2022-09-04 DIAGNOSIS — Z833 Family history of diabetes mellitus: Secondary | ICD-10-CM

## 2022-09-04 DIAGNOSIS — F1010 Alcohol abuse, uncomplicated: Secondary | ICD-10-CM | POA: Diagnosis present

## 2022-09-04 DIAGNOSIS — Z803 Family history of malignant neoplasm of breast: Secondary | ICD-10-CM

## 2022-09-04 DIAGNOSIS — Z6372 Alcoholism and drug addiction in family: Secondary | ICD-10-CM

## 2022-09-04 DIAGNOSIS — Z6834 Body mass index (BMI) 34.0-34.9, adult: Secondary | ICD-10-CM

## 2022-09-04 HISTORY — PX: PARTIAL COLECTOMY: SHX5273

## 2022-09-04 HISTORY — PX: BOWEL RESECTION: SHX1257

## 2022-09-04 LAB — GLUCOSE, CAPILLARY
Glucose-Capillary: 87 mg/dL (ref 70–99)
Glucose-Capillary: 142 mg/dL — ABNORMAL HIGH (ref 70–99)
Glucose-Capillary: 124 mg/dL — ABNORMAL HIGH (ref 70–99)
Glucose-Capillary: 175 mg/dL — ABNORMAL HIGH (ref 70–99)

## 2022-09-04 LAB — URINE DRUG SCREEN, QUALITATIVE (ARMC ONLY)
Amphetamines, Ur Screen: NOT DETECTED
Barbiturates, Ur Screen: NOT DETECTED
Benzodiazepine, Ur Scrn: NOT DETECTED
Cannabinoid 50 Ng, Ur ~~LOC~~: NOT DETECTED
Cocaine Metabolite,Ur ~~LOC~~: NOT DETECTED
MDMA (Ecstasy)Ur Screen: NOT DETECTED
Methadone Scn, Ur: NOT DETECTED
Opiate, Ur Screen: NOT DETECTED
Phencyclidine (PCP) Ur S: NOT DETECTED
Tricyclic, Ur Screen: NOT DETECTED

## 2022-09-04 LAB — CBC
MCH: 27.5 pg (ref 26.0–34.0)
MCHC: 32.5 g/dL (ref 30.0–36.0)
MCV: 84.6 fL (ref 80.0–100.0)
Platelets: 239 10*3/uL (ref 150–400)
RBC: 5.31 MIL/uL (ref 4.22–5.81)
RDW: 13.5 % (ref 11.5–15.5)
WBC: 10.8 10*3/uL — ABNORMAL HIGH (ref 4.0–10.5)
nRBC: 0 % (ref 0.0–0.2)

## 2022-09-04 LAB — TYPE AND SCREEN
ABO/RH(D): O POS
Antibody Screen: NEGATIVE

## 2022-09-04 LAB — ABO/RH: ABO/RH(D): O POS

## 2022-09-04 LAB — HIV ANTIBODY (ROUTINE TESTING W REFLEX): HIV Screen 4th Generation wRfx: NONREACTIVE

## 2022-09-04 SURGERY — EXCISION, SMALL INTESTINE
Anesthesia: General | Laterality: Right

## 2022-09-04 MED ORDER — SEVOFLURANE IN SOLN
RESPIRATORY_TRACT | Status: AC
  Filled 2022-09-04: qty 250

## 2022-09-04 MED ORDER — SODIUM CHLORIDE 0.9 % IV SOLN
2.0000 g | INTRAVENOUS | Status: AC
  Administered 2022-09-04: 2 g via INTRAVENOUS

## 2022-09-04 MED ORDER — LACTATED RINGERS IV SOLN: INTRAVENOUS | Status: DC

## 2022-09-04 MED ORDER — OXYCODONE HCL 5 MG PO TABS: 5.0000 mg | ORAL_TABLET | Freq: Once | ORAL | Status: DC | PRN

## 2022-09-04 MED ORDER — ONDANSETRON 4 MG PO TBDP: 4.0000 mg | ORAL_TABLET | Freq: Four times a day (QID) | ORAL | Status: DC | PRN

## 2022-09-04 MED ORDER — INSULIN ASPART 100 UNIT/ML IJ SOLN
4.0000 [IU] | Freq: Three times a day (TID) | INTRAMUSCULAR | Status: DC
  Administered 2022-09-04 – 2022-09-06 (×6): 4 [IU] via SUBCUTANEOUS
  Filled 2022-09-04 (×6): qty 1

## 2022-09-04 MED ORDER — HYDROXYZINE HCL 25 MG PO TABS: 25.0000 mg | ORAL_TABLET | Freq: Three times a day (TID) | ORAL | Status: DC | PRN

## 2022-09-04 MED ORDER — INSULIN ASPART 100 UNIT/ML IJ SOLN
0.0000 [IU] | Freq: Three times a day (TID) | INTRAMUSCULAR | Status: DC
  Administered 2022-09-04: 2 [IU] via SUBCUTANEOUS
  Filled 2022-09-04: qty 1

## 2022-09-04 MED ORDER — BUPIVACAINE HCL (PF) 0.25 % IJ SOLN
INTRAMUSCULAR | Status: AC
  Filled 2022-09-04: qty 30

## 2022-09-04 MED ORDER — SODIUM CHLORIDE 0.9 % IV SOLN
2.0000 g | Freq: Two times a day (BID) | INTRAVENOUS | Status: AC
  Administered 2022-09-04 – 2022-09-05 (×2): 2 g via INTRAVENOUS
  Filled 2022-09-04 (×2): qty 2

## 2022-09-04 MED ORDER — PHENYLEPHRINE HCL (PRESSORS) 10 MG/ML IV SOLN
INTRAVENOUS | Status: DC | PRN
  Administered 2022-09-04 (×4): 160 ug via INTRAVENOUS

## 2022-09-04 MED ORDER — HYDROMORPHONE HCL 1 MG/ML IJ SOLN
INTRAMUSCULAR | Status: DC | PRN
  Administered 2022-09-04: 1 mg via INTRAVENOUS

## 2022-09-04 MED ORDER — SODIUM CHLORIDE 0.9 % IV SOLN: INTRAVENOUS | Status: DC

## 2022-09-04 MED ORDER — APREPITANT 40 MG PO CAPS: 40.0000 mg | ORAL_CAPSULE | Freq: Once | ORAL | Status: AC

## 2022-09-04 MED ORDER — FENTANYL CITRATE (PF) 100 MCG/2ML IJ SOLN
INTRAMUSCULAR | Status: AC
  Filled 2022-09-04: qty 2

## 2022-09-04 MED ORDER — KETOROLAC TROMETHAMINE 30 MG/ML IJ SOLN
30.0000 mg | Freq: Four times a day (QID) | INTRAMUSCULAR | Status: DC
  Administered 2022-09-04 – 2022-09-06 (×7): 30 mg via INTRAVENOUS
  Filled 2022-09-04 (×7): qty 1

## 2022-09-04 MED ORDER — DEXMEDETOMIDINE HCL IN NACL 80 MCG/20ML IV SOLN
INTRAVENOUS | Status: DC | PRN
  Administered 2022-09-04 (×2): 8 ug via BUCCAL
  Administered 2022-09-04: 4 ug via BUCCAL

## 2022-09-04 MED ORDER — ONDANSETRON HCL 4 MG/2ML IJ SOLN
4.0000 mg | Freq: Four times a day (QID) | INTRAMUSCULAR | Status: DC | PRN
  Administered 2022-09-04: 4 mg via INTRAVENOUS
  Filled 2022-09-04: qty 2

## 2022-09-04 MED ORDER — CELECOXIB 200 MG PO CAPS
ORAL_CAPSULE | ORAL | Status: AC
  Administered 2022-09-04: 200 mg via ORAL
  Filled 2022-09-04: qty 1

## 2022-09-04 MED ORDER — MIRTAZAPINE 15 MG PO TABS
45.0000 mg | ORAL_TABLET | Freq: Every day | ORAL | Status: DC
  Administered 2022-09-04 – 2022-09-06 (×3): 45 mg via ORAL
  Filled 2022-09-04 (×3): qty 3

## 2022-09-04 MED ORDER — LIDOCAINE HCL (CARDIAC) PF 100 MG/5ML IV SOSY
PREFILLED_SYRINGE | INTRAVENOUS | Status: DC | PRN
  Administered 2022-09-04: 100 mg via INTRAVENOUS

## 2022-09-04 MED ORDER — INSULIN ASPART 100 UNIT/ML IJ SOLN
0.0000 [IU] | Freq: Every day | INTRAMUSCULAR | Status: DC
  Filled 2022-09-04: qty 1

## 2022-09-04 MED ORDER — CHLORHEXIDINE GLUCONATE CLOTH 2 % EX PADS
6.0000 | MEDICATED_PAD | Freq: Once | CUTANEOUS | Status: AC
  Administered 2022-09-04: 6 via TOPICAL

## 2022-09-04 MED ORDER — ACETAMINOPHEN 10 MG/ML IV SOLN: 1000.0000 mg | Freq: Once | INTRAVENOUS | Status: DC | PRN

## 2022-09-04 MED ORDER — BUPIVACAINE LIPOSOME 1.3 % IJ SUSP
INTRAMUSCULAR | Status: AC
  Filled 2022-09-04: qty 20

## 2022-09-04 MED ORDER — PANTOPRAZOLE SODIUM 40 MG IV SOLR
40.0000 mg | Freq: Every day | INTRAVENOUS | Status: DC
  Administered 2022-09-04 – 2022-09-05 (×2): 40 mg via INTRAVENOUS
  Filled 2022-09-04 (×2): qty 10

## 2022-09-04 MED ORDER — MIDAZOLAM HCL 2 MG/2ML IJ SOLN
INTRAMUSCULAR | Status: AC
  Filled 2022-09-04: qty 2

## 2022-09-04 MED ORDER — ARIPIPRAZOLE 10 MG PO TABS
30.0000 mg | ORAL_TABLET | Freq: Every day | ORAL | Status: DC
  Administered 2022-09-04 – 2022-09-06 (×3): 30 mg via ORAL
  Filled 2022-09-04 (×3): qty 3

## 2022-09-04 MED ORDER — ALVIMOPAN 12 MG PO CAPS
ORAL_CAPSULE | ORAL | Status: AC
  Administered 2022-09-04: 12 mg via ORAL
  Filled 2022-09-04: qty 1

## 2022-09-04 MED ORDER — SODIUM CHLORIDE (PF) 0.9 % IJ SOLN
INTRAMUSCULAR | Status: DC | PRN
  Administered 2022-09-04: 50 mL

## 2022-09-04 MED ORDER — HYDRALAZINE HCL 20 MG/ML IJ SOLN: 10.0000 mg | INTRAMUSCULAR | Status: DC | PRN

## 2022-09-04 MED ORDER — DEXAMETHASONE SODIUM PHOSPHATE 10 MG/ML IJ SOLN
INTRAMUSCULAR | Status: DC | PRN
  Administered 2022-09-04: 10 mg via INTRAVENOUS

## 2022-09-04 MED ORDER — LACTATED RINGERS IV SOLN: INTRAVENOUS | Status: DC | PRN

## 2022-09-04 MED ORDER — APREPITANT 40 MG PO CAPS
ORAL_CAPSULE | ORAL | Status: AC
  Administered 2022-09-04: 40 mg via ORAL
  Filled 2022-09-04: qty 1

## 2022-09-04 MED ORDER — GABAPENTIN 300 MG PO CAPS
ORAL_CAPSULE | ORAL | Status: AC
  Administered 2022-09-04: 300 mg via ORAL
  Filled 2022-09-04: qty 1

## 2022-09-04 MED ORDER — ACETAMINOPHEN 500 MG PO TABS: 1000.0000 mg | ORAL_TABLET | ORAL | Status: AC

## 2022-09-04 MED ORDER — SODIUM CHLORIDE 0.9 % IV SOLN
INTRAVENOUS | Status: AC
  Filled 2022-09-04: qty 2

## 2022-09-04 MED ORDER — SODIUM CHLORIDE (PF) 0.9 % IJ SOLN
INTRAMUSCULAR | Status: AC
  Filled 2022-09-04: qty 50

## 2022-09-04 MED ORDER — ONDANSETRON HCL 4 MG/2ML IJ SOLN
INTRAMUSCULAR | Status: DC | PRN
  Administered 2022-09-04: 4 mg via INTRAVENOUS

## 2022-09-04 MED ORDER — CELECOXIB 200 MG PO CAPS: 200.0000 mg | ORAL_CAPSULE | ORAL | Status: AC

## 2022-09-04 MED ORDER — CHLORHEXIDINE GLUCONATE CLOTH 2 % EX PADS: 6.0000 | MEDICATED_PAD | Freq: Once | CUTANEOUS | Status: DC

## 2022-09-04 MED ORDER — MIDAZOLAM HCL 2 MG/2ML IJ SOLN
INTRAMUSCULAR | Status: DC | PRN
  Administered 2022-09-04: 2 mg via INTRAVENOUS

## 2022-09-04 MED ORDER — KETAMINE HCL 50 MG/5ML IJ SOSY
PREFILLED_SYRINGE | INTRAMUSCULAR | Status: AC
  Filled 2022-09-04: qty 5

## 2022-09-04 MED ORDER — DIPHENHYDRAMINE HCL 12.5 MG/5ML PO ELIX: 12.5000 mg | ORAL_SOLUTION | Freq: Four times a day (QID) | ORAL | Status: DC | PRN

## 2022-09-04 MED ORDER — ONDANSETRON HCL 4 MG/2ML IJ SOLN: 4.0000 mg | Freq: Once | INTRAMUSCULAR | Status: DC | PRN

## 2022-09-04 MED ORDER — BENZTROPINE MESYLATE 0.5 MG PO TABS
1.0000 mg | ORAL_TABLET | Freq: Every day | ORAL | Status: DC
  Administered 2022-09-04 – 2022-09-07 (×4): 1 mg via ORAL
  Filled 2022-09-04 (×4): qty 2

## 2022-09-04 MED ORDER — BUPIVACAINE LIPOSOME 1.3 % IJ SUSP
INTRAMUSCULAR | Status: DC | PRN
  Administered 2022-09-04: 20 mL

## 2022-09-04 MED ORDER — CHLORHEXIDINE GLUCONATE 0.12 % MT SOLN
OROMUCOSAL | Status: AC
  Administered 2022-09-04: 15 mL via OROMUCOSAL
  Filled 2022-09-04: qty 15

## 2022-09-04 MED ORDER — ALVIMOPAN 12 MG PO CAPS: 12.0000 mg | ORAL_CAPSULE | ORAL | Status: AC

## 2022-09-04 MED ORDER — HYDROMORPHONE HCL 1 MG/ML IJ SOLN: 0.5000 mg | INTRAMUSCULAR | Status: DC | PRN

## 2022-09-04 MED ORDER — FENTANYL CITRATE (PF) 100 MCG/2ML IJ SOLN
INTRAMUSCULAR | Status: DC | PRN
  Administered 2022-09-04: 100 ug via INTRAVENOUS
  Administered 2022-09-04 (×2): 50 ug via INTRAVENOUS

## 2022-09-04 MED ORDER — MORPHINE SULFATE (PF) 2 MG/ML IV SOLN: 2.0000 mg | INTRAVENOUS | Status: DC | PRN

## 2022-09-04 MED ORDER — OXYCODONE HCL 5 MG PO TABS
5.0000 mg | ORAL_TABLET | ORAL | Status: DC | PRN
  Administered 2022-09-04 – 2022-09-07 (×8): 10 mg via ORAL
  Filled 2022-09-04 (×8): qty 2

## 2022-09-04 MED ORDER — DIVALPROEX SODIUM ER 500 MG PO TB24
750.0000 mg | ORAL_TABLET | Freq: Every day | ORAL | Status: DC
  Administered 2022-09-04 – 2022-09-07 (×4): 750 mg via ORAL
  Filled 2022-09-04 (×4): qty 1

## 2022-09-04 MED ORDER — ACETAMINOPHEN 500 MG PO TABS
1000.0000 mg | ORAL_TABLET | Freq: Four times a day (QID) | ORAL | Status: DC
  Administered 2022-09-04 – 2022-09-07 (×8): 1000 mg via ORAL
  Filled 2022-09-04 (×8): qty 2

## 2022-09-04 MED ORDER — CHLORHEXIDINE GLUCONATE 0.12 % MT SOLN: 15.0000 mL | Freq: Once | OROMUCOSAL | Status: AC

## 2022-09-04 MED ORDER — ENOXAPARIN SODIUM 40 MG/0.4ML IJ SOSY
40.0000 mg | PREFILLED_SYRINGE | INTRAMUSCULAR | Status: DC
  Administered 2022-09-05 – 2022-09-06 (×2): 40 mg via SUBCUTANEOUS
  Filled 2022-09-04 (×2): qty 0.4

## 2022-09-04 MED ORDER — ORAL CARE MOUTH RINSE: 15.0000 mL | Freq: Once | OROMUCOSAL | Status: AC

## 2022-09-04 MED ORDER — SUGAMMADEX SODIUM 500 MG/5ML IV SOLN
INTRAVENOUS | Status: DC | PRN
  Administered 2022-09-04: 300 mg via INTRAVENOUS

## 2022-09-04 MED ORDER — ROCURONIUM BROMIDE 100 MG/10ML IV SOLN
INTRAVENOUS | Status: DC | PRN
  Administered 2022-09-04: 70 mg via INTRAVENOUS
  Administered 2022-09-04: 30 mg via INTRAVENOUS
  Administered 2022-09-04 (×2): 20 mg via INTRAVENOUS

## 2022-09-04 MED ORDER — GABAPENTIN 300 MG PO CAPS: 300.0000 mg | ORAL_CAPSULE | ORAL | Status: AC

## 2022-09-04 MED ORDER — ACETAMINOPHEN 500 MG PO TABS
ORAL_TABLET | ORAL | Status: AC
  Administered 2022-09-04: 1000 mg via ORAL
  Filled 2022-09-04: qty 2

## 2022-09-04 MED ORDER — OXYCODONE HCL 5 MG/5ML PO SOLN: 5.0000 mg | Freq: Once | ORAL | Status: DC | PRN

## 2022-09-04 MED ORDER — ARIPIPRAZOLE ER 400 MG IM PRSY: 400.0000 mg | PREFILLED_SYRINGE | INTRAMUSCULAR | Status: DC

## 2022-09-04 MED ORDER — HYDROMORPHONE HCL 1 MG/ML IJ SOLN
INTRAMUSCULAR | Status: AC
  Filled 2022-09-04: qty 1

## 2022-09-04 MED ORDER — FENTANYL CITRATE (PF) 100 MCG/2ML IJ SOLN: 25.0000 ug | INTRAMUSCULAR | Status: DC | PRN

## 2022-09-04 MED ORDER — PROPOFOL 10 MG/ML IV BOLUS
INTRAVENOUS | Status: DC | PRN
  Administered 2022-09-04: 150 mg via INTRAVENOUS

## 2022-09-04 MED ORDER — BUPIVACAINE-EPINEPHRINE 0.25% -1:200000 IJ SOLN
INTRAMUSCULAR | Status: DC | PRN
  Administered 2022-09-04: 30 mL

## 2022-09-04 MED ORDER — DIPHENHYDRAMINE HCL 50 MG/ML IJ SOLN: 12.5000 mg | Freq: Four times a day (QID) | INTRAMUSCULAR | Status: DC | PRN

## 2022-09-04 MED ORDER — ESCITALOPRAM OXALATE 10 MG PO TABS
20.0000 mg | ORAL_TABLET | Freq: Every day | ORAL | Status: DC
  Administered 2022-09-04 – 2022-09-06 (×3): 20 mg via ORAL
  Filled 2022-09-04 (×3): qty 2

## 2022-09-04 SURGICAL SUPPLY — 40 items
CHLORAPREP W/TINT 26 (MISCELLANEOUS) ×2 IMPLANT
DRAPE LAPAROTOMY 100X77 ABD (DRAPES) ×2 IMPLANT
ELECT BLADE 6.5 EXT (BLADE) IMPLANT
ELECT EZSTD 165MM 6.5IN (MISCELLANEOUS) ×2
ELECT REM PT RETURN 9FT ADLT (ELECTROSURGICAL) ×2
ELECTRODE EZSTD 165MM 6.5IN (MISCELLANEOUS) ×2 IMPLANT
ELECTRODE REM PT RTRN 9FT ADLT (ELECTROSURGICAL) ×2 IMPLANT
GAUZE 4X4 16PLY ~~LOC~~+RFID DBL (SPONGE) ×2 IMPLANT
GAUZE SPONGE 4X4 12PLY STRL (GAUZE/BANDAGES/DRESSINGS) ×2 IMPLANT
GLOVE BIO SURGEON STRL SZ7 (GLOVE) ×4 IMPLANT
GOWN STRL REUS W/ TWL LRG LVL3 (GOWN DISPOSABLE) ×8 IMPLANT
GOWN STRL REUS W/TWL LRG LVL3 (GOWN DISPOSABLE) ×10
KIT TURNOVER KIT A (KITS) ×2 IMPLANT
LABEL OR SOLS (LABEL) ×2 IMPLANT
LIGASURE IMPACT 36 18CM CVD LR (INSTRUMENTS) ×2 IMPLANT
MANIFOLD NEPTUNE II (INSTRUMENTS) ×2 IMPLANT
NEEDLE HYPO 22GX1.5 SAFETY (NEEDLE) ×2 IMPLANT
NS IRRIG 1000ML POUR BTL (IV SOLUTION) ×2 IMPLANT
PACK BASIN MAJOR ARMC (MISCELLANEOUS) ×2 IMPLANT
PACK COLON CLEAN CLOSURE (MISCELLANEOUS) ×2 IMPLANT
RELOAD PROXIMATE 75MM BLUE (ENDOMECHANICALS) ×6 IMPLANT
RELOAD STAPLE 75 3.8 BLU REG (ENDOMECHANICALS) IMPLANT
SPONGE T-LAP 18X18 ~~LOC~~+RFID (SPONGE) ×10 IMPLANT
SPONGE T-LAP 18X36 ~~LOC~~+RFID STR (SPONGE) IMPLANT
STAPLER CVD CUT BL 40 RELOAD (ENDOMECHANICALS) IMPLANT
STAPLER CVD CUT BLU 40 RELOAD (ENDOMECHANICALS) ×2 IMPLANT
STAPLER PROXIMATE 75MM BLUE (STAPLE) IMPLANT
STAPLER SKIN PROX 35W (STAPLE) ×2 IMPLANT
SUT PDS AB 0 CT1 27 (SUTURE) ×6 IMPLANT
SUT SILK 2 0 (SUTURE) ×2
SUT SILK 2 0SH CR/8 30 (SUTURE) ×2 IMPLANT
SUT SILK 2-0 18XBRD TIE 12 (SUTURE) ×2 IMPLANT
SUT V-LOC 90 ABS 3-0 VLT  V-20 (SUTURE) ×2
SUT V-LOC 90 ABS 3-0 VLT V-20 (SUTURE) IMPLANT
SUT VIC AB 3-0 SH 27 (SUTURE) ×4
SUT VIC AB 3-0 SH 27X BRD (SUTURE) ×2 IMPLANT
SYR 20ML LL LF (SYRINGE) ×2 IMPLANT
TRAP FLUID SMOKE EVACUATOR (MISCELLANEOUS) ×2 IMPLANT
TRAY FOLEY MTR SLVR 16FR STAT (SET/KITS/TRAYS/PACK) ×2 IMPLANT
WATER STERILE IRR 500ML POUR (IV SOLUTION) ×2 IMPLANT

## 2022-09-04 NOTE — Anesthesia Preprocedure Evaluation (Addendum)
Anesthesia Evaluation  Patient identified by MRN, date of birth, ID band Patient awake    Reviewed: Allergy & Precautions, NPO status , Patient's Chart, lab work & pertinent test results  History of Anesthesia Complications Negative for: history of anesthetic complications  Airway Mallampati: III  TM Distance: >3 FB Neck ROM: Full   Comment: Large beard Dental no notable dental hx. (+) Teeth Intact   Pulmonary neg sleep apnea, neg COPD, Current Smoker and Patient abstained from smoking.   Pulmonary exam normal breath sounds clear to auscultation       Cardiovascular Exercise Tolerance: Good METS(-) hypertension(-) CAD and (-) Past MI (-) dysrhythmias  Rhythm:Regular Rate:Normal - Systolic murmurs Murmur  TTE 2021 unremarkable  ECG 08/03/22:  Normal sinus rhythm Nonspecific T wave abnormality Abnormal ECG When compared with ECG of 17-Jul-2020 12:14, No significant change since last tracing   Neuro/Psych  PSYCHIATRIC DISORDERS  Depression  Schizophrenia  Alcohol use disorder CVA (2021), No Residual Symptoms    GI/Hepatic ,neg GERD  ,,(+)     substance abuse  marijuana use  Endo/Other  neg diabetes    Renal/GU negative Renal ROS     Musculoskeletal   Abdominal  (+) + obese  Peds  Hematology   Anesthesia Other Findings Past Medical History: No date: Alcohol abuse No date: Depression No date: Drug abuse ([place]) 66/03/3015: 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)     Comment:  left cerebrum No date: Erectile dysfunction No date: Heart murmur No date: Hyperlipidemia No date: Schizophrenia ([place]) No date: Tobacco abuse  Reproductive/Obstetrics                              Anesthesia Physical Anesthesia Plan  ASA: 3  Anesthesia Plan: General   Post-op Pain Management: Celebrex PO (pre-op)*, Gabapentin PO (pre-op)*, Tylenol PO (pre-op)* and Dilaudid IV   Induction:  Intravenous  PONV Risk Score and Plan: 3 and Ondansetron, Dexamethasone, Midazolam and Treatment may vary due to age or medical condition  Airway Management Planned: Oral ETT  Additional Equipment: None  Intra-op Plan:   Post-operative Plan: Extubation in OR  Informed Consent: I have reviewed the patients History and Physical, chart, labs and discussed the procedure including the risks, benefits and alternatives for the proposed anesthesia with the patient or authorized representative who has indicated his/her understanding and acceptance.     Dental advisory given  Plan Discussed with: CRNA and Surgeon  Anesthesia Plan Comments: (Discussed risks of anesthesia with patient, including PONV, sore throat, lip/dental/eye damage. Rare risks discussed as well, such as cardiorespiratory and neurological sequelae, and allergic reactions. Discussed the role of CRNA in patient's perioperative care. Patient understands. Patient counseled on benefits of smoking cessation, and increased perioperative risks associated with continued smoking.  Discussed abdomiinal nerve blocks with surgeon, who would rather prefer to perform himself at end of case under direct visualization.)         Anesthesia Quick Evaluation

## 2022-09-04 NOTE — Op Note (Signed)
PROCEDURES: Right Hemicolectomy With stapled ileocolostomy Small Bowel resection 35 cm additional  Pre-operative Diagnosis: Small bowel tumor invading right colon  Post-operative Diagnosis: Same  Surgeon: Jules Husbands MD FACS  Assistants: Apolonio Schneiders PA-S Required due to the complexity of the case: for exposure and creation of the anastomosis  Anesthesia: General endotracheal anesthesia  ASA Class: 2  Surgeon: Caroleen Hamman, MD FACS  Anesthesia: Gen. with endotracheal tube   Findings: Mesenteric mass from Terminal ileum abutting into the right colon No evidence of distant metastasis, liver free of mets Tension free anastomosis, no evidence of intraop leak and good perfusion  Estimated Blood Loss: 50cc         Drains: none         Specimens: Right colon with terminal ileum, additional 35 cm segment of ileum required due to additional mesenteric involvement       Complications: none          Procedure Details  The patient was seen again in the Holding Room. The benefits, complications, treatment options, and expected outcomes were discussed with the patient. The risks of bleeding, infection, recurrence of symptoms, failure to resolve symptoms,  bowel injury, any of which could require further surgery were reviewed with the patient.   The patient was taken to Operating Room, identified as Brian Rios and the procedure verified.  A Time Out was held and the above information confirmed.  Prior to the induction of general anesthesia, antibiotic prophylaxis was administered. VTE prophylaxis was in place. General endotracheal anesthesia was then administered and tolerated well. After the induction, the abdomen was prepped with Chloraprep and draped in the sterile fashion. The patient was positioned in the supine position.  Generous incision was created as a midline  laparotomy. The abdominal cavity was entered under direct visualization after elevating the peritoneum and accessing the  abdominal cavity under direct visualization after incising the peritoneum.  Initial inspection revealed a mesenteric mass within the terminal ileum brought in the right colon.  I was able to create a window in the proximal small bowel and divided the small bowel using a standard 75 GIA stapler.  This gave him more mobility and using the LigaSure device the mesentery was divided in the standard fashion.  This allowed me to assess the mass even further and I noticed that it was involving up to the last centimeter of the terminal ileum.  For that reason this patient needed a completion right colectomy.   The greater omentum was divided and the hepatic flexure was taken down using harmonic scalpel.  The white line of Toldt was incised and a lateral to medial dissection was performed.  We identified the right ureter as well as the duodenum and preserve both structures at all times. We Were also able to mobilize the attachments of the cecum and terminal ileum.   Attention then was turned to the distal excision margin.  We identified the middle colic artery on selected a spot right to the middle colic artery.  Were able to also use a 75 GIA stapler to divide this area.  The mesentery was scored with electrocautery.  We identified the right colic artery and suture ligated with 2 oh silks in the standard fashion.  The rest of the mesentery was divided using the harmonic scalpel.  Please note that we went as low as possible to the base of the mesentery to obtain adequate lymph nodes and adequate margins of dissection. Specimen was passed and sent to  permanent pathology.   After the specimen was removed I noticed still a mass within the mesentery that needed to be excised.  Were able to excise the space of the mesentery and by doing this we needed to also perform a segmental resection of the terminal ileum.  Please note that an additional 35 cm of the terminal ileum was taken out by dividing the proximal bowel with a  GIA stapler.  The mesentery was divided with a LigaSure device and the pedicle was suture-ligated with 2 oh silks.    A standard side-to-side functional end to end staple anastomosis was created with multiple loads of a 75 GIA stapler device.  Common channel closed w a 3-0 vlock sutures.  We check for patency as well as leak.  There was a tension-free anastomosis with good perfusion and no evidence of intraoperative leak.  The mesenteric defect was closed with a running 3-0 Vicryl in the standard fashion. We changed gloves and place a clean closure tray.   Liposomal Marcaine was injected throughout the abdominal wall on both sides under direct visualization and palpation.  The fascia was closed with a running 0 PDS using the small bite techniques.  Incisions were closed with staples in the standard  fashion.   Needle and laparotomy counts were correct and there were no immediate complications   Caroleen Hamman, MD, FACS

## 2022-09-04 NOTE — Interval H&P Note (Signed)
History and Physical Interval Note:  09/04/2022 7:43 AM  Brian Rios  has presented today for surgery, with the diagnosis of smal bowel mass.  The various methods of treatment have been discussed with the patient and family. After consideration of risks, benefits and other options for treatment, the patient has consented to  Procedure(s): SMALL BOWEL RESECTION, open (N/A) as a surgical intervention.  The patient's history has been reviewed, patient examined, no change in status, stable for surgery.  I have reviewed the patient's chart and labs.  Questions were answered to the patient's satisfaction.     [place]

## 2022-09-04 NOTE — Anesthesia Procedure Notes (Signed)
Procedure Name: Intubation Date/Time: 09/04/2022 9:37 AM  Performed by: Beverely Low, CRNAPre-anesthesia Checklist: Patient identified, Patient being monitored, Timeout performed, Emergency Drugs available and Suction available Patient Re-evaluated:Patient Re-evaluated prior to induction Oxygen Delivery Method: Circle system utilized Preoxygenation: Pre-oxygenation with 100% oxygen Induction Type: IV induction Ventilation: Mask ventilation without difficulty Laryngoscope Size: McGraph and 4 Grade View: Grade I Tube type: Oral Tube size: 7.5 mm Number of attempts: 1 Airway Equipment and Method: Stylet Placement Confirmation: ETT inserted through vocal cords under direct vision, positive ETCO2 and breath sounds checked- equal and bilateral Secured at: 21 cm Tube secured with: Tape Dental Injury: Teeth and Oropharynx as per pre-operative assessment

## 2022-09-04 NOTE — Transfer of Care (Signed)
Immediate Anesthesia Transfer of Care Note  Patient: Brian Rios  Procedure(s) Performed: SMALL BOWEL RESECTION, open PARTIAL COLECTOMY (Right)  Patient Location: PACU  Anesthesia Type:General  Level of Consciousness: drowsy  Airway & Oxygen Therapy: Patient Spontanous Breathing and Patient connected to face mask oxygen  Post-op Assessment: Report given to RN and Post -op Vital signs reviewed and stable  Post vital signs: Reviewed and stable  Last Vitals:  Vitals Value Taken Time  BP 121/70 09/04/22 1300  Temp    Pulse 90 09/04/22 1304  Resp 15 09/04/22 1304  SpO2 90 % 09/04/22 1304  Vitals shown include unvalidated device data.  Last Pain:  Vitals:   09/04/22 0810  TempSrc: Temporal  PainSc: 0-No pain         Complications: No notable events documented.

## 2022-09-05 LAB — PHOSPHORUS: Phosphorus: 3.7 mg/dL (ref 2.5–4.6)

## 2022-09-05 LAB — CBC
HCT: 40.4 % (ref 39.0–52.0)
Hemoglobin: 13.3 g/dL (ref 13.0–17.0)
MCH: 27.6 pg (ref 26.0–34.0)
MCHC: 32.9 g/dL (ref 30.0–36.0)
MCV: 83.8 fL (ref 80.0–100.0)
Platelets: 253 10*3/uL (ref 150–400)
RBC: 4.82 MIL/uL (ref 4.22–5.81)
RDW: 13.3 % (ref 11.5–15.5)
WBC: 24.4 10*3/uL — ABNORMAL HIGH (ref 4.0–10.5)
nRBC: 0 % (ref 0.0–0.2)

## 2022-09-05 LAB — BASIC METABOLIC PANEL
Anion gap: 3 — ABNORMAL LOW (ref 5–15)
BUN: 9 mg/dL (ref 6–20)
CO2: 25 mmol/L (ref 22–32)
Calcium: 7.9 mg/dL — ABNORMAL LOW (ref 8.9–10.3)
Chloride: 111 mmol/L (ref 98–111)
Creatinine, Ser: 1.06 mg/dL (ref 0.61–1.24)
GFR, Estimated: >60 mL/min (ref >60)
Glucose, Bld: 115 mg/dL — ABNORMAL HIGH (ref 70–99)
Potassium: 3.9 mmol/L (ref 3.5–5.1)
Sodium: 139 mmol/L (ref 135–145)

## 2022-09-05 LAB — GLUCOSE, CAPILLARY
Glucose-Capillary: 102 mg/dL — ABNORMAL HIGH (ref 70–99)
Glucose-Capillary: 81 mg/dL (ref 70–99)
Glucose-Capillary: 100 mg/dL — ABNORMAL HIGH (ref 70–99)
Glucose-Capillary: 94 mg/dL (ref 70–99)

## 2022-09-05 LAB — MAGNESIUM: Magnesium: 1.9 mg/dL (ref 1.7–2.4)

## 2022-09-05 LAB — HEMOGLOBIN A1C
Hgb A1c MFr Bld: 5.7 % — ABNORMAL HIGH (ref 4.8–5.6)
Mean Plasma Glucose: 116.89 mg/dL

## 2022-09-05 MED ORDER — VALBENAZINE TOSYLATE 40 MG PO CAPS
80.0000 mg | ORAL_CAPSULE | Freq: Every day | ORAL | Status: DC
  Administered 2022-09-06: 80 mg via ORAL
  Filled 2022-09-05: qty 2

## 2022-09-05 NOTE — Progress Notes (Addendum)
42 - received report 2040 - scheduled medication administered 2222 - right side lying with eyes closed, respirations noted to be even and unlabored, no obvious signs of distress or discomfort noted 2338 - refused scheduled medications 0042 - would like scheduled medications, c/o  pain, prn analgesic administered per physician's order 0216 - pt resting in bed with eyes closed lying supine, respirations even and unlabored, no signs of distress or discomfort noted 0345 - pt continues to rest with eyes closed, no signs of discomfort or distress noted, respirations even and unlabored 0527 - pt awakened for scheduled medication administration. Refuses scheduled medication stating "I feel pretty good right now".

## 2022-09-05 NOTE — Plan of Care (Signed)

## 2022-09-05 NOTE — TOC Initial Note (Signed)
Transition of Care [facility]) - Initial/Assessment Note    Patient Details  Name: Brian Rios MRN: [id number] Date of Birth: [birth year]  Transition of Care [facility]) CM/SW Contact:    Beverly Sessions, RN Phone Number: 09/05/2022, 1:50 PM  Clinical Narrative:                     Transition of Care (TOC) Screening Note   Patient Details  Name: Brian Rios Date of Birth: May 26, 1981   Transition of Care [facility]) CM/SW Contact:    Beverly Sessions, RN Phone Number: 09/05/2022, 1:51 PM    Transition of Care Department [facility]) has reviewed patient and no TOC needs have been identified at this time. We will continue to monitor patient advancement through interdisciplinary progression rounds. If new patient transition needs arise, please place a TOC consult.  Established with PCP [place] [facility] pharmacy for mediation needs       Patient Goals and CMS Choice        Expected Discharge Plan and Services                                                Prior Living Arrangements/Services                       Activities of Daily Living Home Assistive Devices/Equipment: None ADL Screening (condition at time of admission) Patient's cognitive ability adequate to safely complete daily activities?: Yes Is the patient deaf or have difficulty hearing?: No Does the patient have difficulty seeing, even when wearing glasses/contacts?: No Does the patient have difficulty concentrating, remembering, or making decisions?: No Patient able to express need for assistance with ADLs?: Yes Does the patient have difficulty dressing or bathing?: No Independently performs ADLs?: Yes (appropriate for developmental age) Does the patient have difficulty walking or climbing stairs?: No Weakness of Legs: None Weakness of Arms/Hands: None  Permission Sought/Granted                  Emotional Assessment              Admission diagnosis:  S/P right  colectomy [Z90.49] Patient Active Problem List   Diagnosis Date Noted   Small bowel mass 09/04/2022   S/P right colectomy 09/04/2022   Dental decay 06/12/2022   Chest pain 05/17/2022   Abnormal CT scan 12/28/2021   Elevated blood pressure reading 07/26/2021   Erectile dysfunction 07/26/2021   H/O elevated lipids 97/67/3419   Embolic stroke ([place]) 37/90/2409   Stroke ([place]) 07/17/2020   Drug abuse ([place]) 07/17/2020   Major depressive disorder, recurrent episode, severe ([place]) 02/20/2015   Alcohol use disorder, severe, dependence ([place]) 02/20/2015   Stimulant use disorder 02/20/2015   Tobacco use disorder 02/20/2015   PCP:  Langston Reusing, NP Pharmacy:   [facility] [place] [address] Phone: [phone] Fax: [phone]     Social Determinants of Health (SDOH) Interventions    Readmission Risk Interventions     No data to display

## 2022-09-05 NOTE — Progress Notes (Signed)
Per Jules Husbands, MD page tonight, OK to leave foley overnight this shift.   EOS: A&Ox4, but intermittently confused/bizarre behavior/interactions/reactions to conversation w/ labile mood. Pt removed PIV, but states this RN did so. Overall, pleasant overnight.

## 2022-09-05 NOTE — Progress Notes (Signed)
[facility] Hospital Day(s): 1.   Post op day(s): 1 Day Post-Op.   Interval History:  Patient seen and examined No acute events or new complaints overnight.  Patient reports he has abdominal soreness; expectedly No fever, chills, nausea, emesis Leukocytosis to 24.4K this morning Renal function improved; sCr - 1.06; UO - 1300 ccs No electrolyte derangements  He is on CLD  Vital signs in last 24 hours: [min-max] current  Temp:  [97.1 F (36.2 C)-98.9 F (37.2 C)] 98 F (36.7 C) (11/15 0740) Pulse Rate:  [72-94] 89 (11/15 0740) Resp:  [14-20] 16 (11/15 0740) BP: (121-141)/(58-96) 140/87 (11/15 0740) SpO2:  [90 %-99 %] 96 % (11/15 0740) Weight:  [108 kg] 108 kg (11/14 0810)     Height: '5\' 10"'$  (177.8 cm) Weight: 108 kg BMI (Calculated): 34.15   Intake/Output last 2 shifts:  11/14 0701 - 11/15 0700 In: 2411.1 [P.O.:240; I.V.:1971.1; IV Piggyback:200] Out: 1350 [Urine:1300; Blood:50]   Physical Exam:  Constitutional: alert, cooperative and no distress  Respiratory: breathing non-labored at rest  Cardiovascular: regular rate and sinus rhythm  Gastrointestinal: Soft, incisional soreness, non-distended, no rebound/guarding Integumentary: Laparotomy is CDI with staples and honeycomb, no erythema   Labs:     Latest Ref Rng & Units 09/05/2022    4:23 AM 09/04/2022    2:44 PM 07/18/2022    9:51 AM  CBC  WBC 4.0 - 10.5 K/uL 24.4  10.8  9.3   Hemoglobin 13.0 - 17.0 g/dL 13.3  14.6  14.3   Hematocrit 39.0 - 52.0 % 40.4  44.9  42.8   Platelets 150 - 400 K/uL 253  239  229       Latest Ref Rng & Units 09/05/2022    4:23 AM 09/04/2022    2:44 PM 07/18/2022    9:51 AM  CMP  Glucose 70 - 99 mg/dL 115   95   BUN 6 - 20 mg/dL 9   12   Creatinine 0.61 - 1.24 mg/dL 1.06  1.28  0.95   Sodium 135 - 145 mmol/L 139   138   Potassium 3.5 - 5.1 mmol/L 3.9   4.4   Chloride 98 - 111 mmol/L 111   104   CO2 22 - 32 mmol/L 25   22   Calcium 8.9 -  10.3 mg/dL 7.9   8.6   Total Protein 6.0 - 8.5 g/dL   5.9   Total Bilirubin 0.0 - 1.2 mg/dL   <0.2   Alkaline Phos 44 - 121 IU/L   74   AST 0 - 40 IU/L   18   ALT 0 - 44 IU/L   21      Imaging studies: No new pertinent imaging studies   Assessment/Plan: 41 y.o. 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     - Advance to full liquid diet  - Discontinue foley catheter  - Complete perioperative Abx  - Monitor abdominal examination; on-going bowel function  - Pain control prn; antiemetics prn   - Monitor leukocytosis; morning CBC   - Mobilize   All of the above findings and recommendations were discussed with the patient, and the medical team, and all of patient's questions were answered to his expressed satisfaction.  -- Edison Simon, PA-C [facility] 09/05/2022, 7:54 AM M-F: 7am - 4pm

## 2022-09-05 NOTE — Anesthesia Postprocedure Evaluation (Signed)
Anesthesia Post Note  Patient: Brian Rios  Procedure(s) Performed: SMALL BOWEL RESECTION, open PARTIAL COLECTOMY (Right)  Patient location during evaluation: PACU Anesthesia Type: General Level of consciousness: awake and alert Pain management: pain level controlled Vital Signs Assessment: post-procedure vital signs reviewed and stable Respiratory status: spontaneous breathing, nonlabored ventilation, respiratory function stable and patient connected to nasal cannula oxygen Cardiovascular status: blood pressure returned to baseline and stable Postop Assessment: no apparent nausea or vomiting Anesthetic complications: no   No notable events documented.   Last Vitals:  Vitals:   09/05/22 0425 09/05/22 0740  BP: (!) 127/58 (!) 140/87  Pulse: 72 89  Resp: 20 16  Temp: 36.6 C 36.7 C  SpO2: 97% 96%    Last Pain:  Vitals:   09/05/22 0814  TempSrc:   PainSc: 7                  Arita Miss

## 2022-09-05 NOTE — Progress Notes (Signed)
Patient tolerating oral intake well. Ambulating around nurses station on own. Order received from MD to discontinue IVF

## 2022-09-06 LAB — BASIC METABOLIC PANEL
Anion gap: 6 (ref 5–15)
BUN: 5 mg/dL — ABNORMAL LOW (ref 6–20)
CO2: 26 mmol/L (ref 22–32)
Calcium: 8.3 mg/dL — ABNORMAL LOW (ref 8.9–10.3)
Chloride: 111 mmol/L (ref 98–111)
Creatinine, Ser: 0.97 mg/dL (ref 0.61–1.24)
GFR, Estimated: >60 mL/min (ref >60)
Glucose, Bld: 98 mg/dL (ref 70–99)
Potassium: 4.1 mmol/L (ref 3.5–5.1)
Sodium: 143 mmol/L (ref 135–145)

## 2022-09-06 LAB — CBC
HCT: 36.2 % — ABNORMAL LOW (ref 39.0–52.0)
Hemoglobin: 11.9 g/dL — ABNORMAL LOW (ref 13.0–17.0)
MCH: 27.3 pg (ref 26.0–34.0)
MCHC: 32.9 g/dL (ref 30.0–36.0)
MCV: 83 fL (ref 80.0–100.0)
Platelets: 213 10*3/uL (ref 150–400)
RBC: 4.36 MIL/uL (ref 4.22–5.81)
RDW: 13.4 % (ref 11.5–15.5)
WBC: 15.6 10*3/uL — ABNORMAL HIGH (ref 4.0–10.5)
nRBC: 0 % (ref 0.0–0.2)

## 2022-09-06 LAB — GLUCOSE, CAPILLARY
Glucose-Capillary: 105 mg/dL — ABNORMAL HIGH (ref 70–99)
Glucose-Capillary: 99 mg/dL (ref 70–99)
Glucose-Capillary: 91 mg/dL (ref 70–99)
Glucose-Capillary: 79 mg/dL (ref 70–99)
Glucose-Capillary: 135 mg/dL — ABNORMAL HIGH (ref 70–99)

## 2022-09-06 MED ORDER — PANTOPRAZOLE SODIUM 40 MG PO TBEC
40.0000 mg | DELAYED_RELEASE_TABLET | Freq: Every day | ORAL | Status: DC
  Administered 2022-09-06: 40 mg via ORAL
  Filled 2022-09-06: qty 1

## 2022-09-06 NOTE — Progress Notes (Signed)
Patient refusing ice pack at this time.  Provided education and rationale as to reason for ice pack however pt continues to refuse.

## 2022-09-06 NOTE — Progress Notes (Signed)
[facility] Hospital Day(s): 2.   Post op day(s): 2 Days Post-Op.   Interval History:  Patient seen and examined No acute events or new complaints overnight.  Doing well; abdominal soreness No fever, chills, nausea, emesis Leukocytosis improving; 15.6K Renal function improved; sCr - 0.97; UO - 275 ccs + unmeasured  No electrolyte derangements  He is on FLD; asking for solids Had BM  Vital signs in last 24 hours: [min-max] current  Temp:  [97.3 F (36.3 C)-98 F (36.7 C)] 97.3 F (36.3 C) (11/16 0726) Pulse Rate:  [80-105] 89 (11/16 0726) Resp:  [16-20] 18 (11/16 0726) BP: (139-150)/(82-99) 150/99 (11/16 0726) SpO2:  [95 %-98 %] 98 % (11/16 0726)     Height: '5\' 10"'$  (177.8 cm) Weight: 108 kg BMI (Calculated): 34.15   Intake/Output last 2 shifts:  11/15 0701 - 11/16 0700 In: 1801.2 [P.O.:780; I.V.:921.2; IV Piggyback:100] Out: 275 [Urine:275]   Physical Exam:  Constitutional: alert, cooperative and no distress  Respiratory: breathing non-labored at rest  Cardiovascular: regular rate and sinus rhythm  Gastrointestinal: Soft, incisional soreness, non-distended, no rebound/guarding Integumentary: Laparotomy is CDI with staples, no erythema   Labs:     Latest Ref Rng & Units 09/06/2022    5:42 AM 09/05/2022    4:23 AM 09/04/2022    2:44 PM  CBC  WBC 4.0 - 10.5 K/uL 15.6  24.4  10.8   Hemoglobin 13.0 - 17.0 g/dL 11.9  13.3  14.6   Hematocrit 39.0 - 52.0 % 36.2  40.4  44.9   Platelets 150 - 400 K/uL 213  253  239       Latest Ref Rng & Units 09/06/2022    5:42 AM 09/05/2022    4:23 AM 09/04/2022    2:44 PM  CMP  Glucose 70 - 99 mg/dL 98  115    BUN 6 - 20 mg/dL 5  9    Creatinine 0.61 - 1.24 mg/dL 0.97  1.06  1.28   Sodium 135 - 145 mmol/L 143  139    Potassium 3.5 - 5.1 mmol/L 4.1  3.9    Chloride 98 - 111 mmol/L 111  111    CO2 22 - 32 mmol/L 26  25    Calcium 8.9 - 10.3 mg/dL 8.3  7.9       Imaging studies: No new  pertinent imaging studies   Assessment/Plan: 41 y.o. 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     - Advance to soft diet  - Monitor abdominal examination; on-going bowel function  - Pain control prn; antiemetics prn   - Monitor leukocytosis; improved  - Mobilize today   - Discharge Planning: Doing well, anticipate discharge tomorrow (11/17) morning   All of the above findings and recommendations were discussed with the patient, and the medical team, and all of patient's questions were answered to his expressed satisfaction.  -- Edison Simon, PA-C [facility] 09/06/2022, 8:21 AM M-F: 7am - 4pm

## 2022-09-06 NOTE — Progress Notes (Addendum)
5 - received report 2029 - scheduled medications administered with PRN analgesics administered per physician's order. Denies needs at this time 2210 - pt resting with eyes closed, respirations even and unlabored, no signs of distress or discomfort noted 0046 - pt resting in bed with eyes closed lying supine, respirations even and unlabored, no distress or discomfort noted 0233 - pt resting in bed with eyes closed lying right side, respirations even and unlabored, no signs of distress or discomfort noted 0337 - pt called this RN for pain medicine. Removed IV while sleeping per pt. Asked if it is okay to replace IV, pt states it's okay to leave it out until he speaks with surgeon.  Will notify on-call to make aware.  0552 - pt resting with eyes closed, respirations even and unlabored, no signs of distress or discomfort noted 0615 - attempted to call surgery on call [place] to make aware of IV, but went to VM and mailbox is full.

## 2022-09-06 NOTE — Progress Notes (Signed)
  Progress Note   Date: 09/06/2022  Patient Name: Brian Rios        MRN#: [id number]  Review the patient's clinical findings supports the diagnosis of:   Morbid obesity

## 2022-09-06 NOTE — Plan of Care (Signed)
  Problem: Nutritional: Goal: Maintenance of adequate nutrition will improve Outcome: Progressing   Problem: Skin Integrity: Goal: Risk for impaired skin integrity will decrease Outcome: Progressing

## 2022-09-07 ENCOUNTER — Other Ambulatory Visit: Payer: Self-pay

## 2022-09-07 ENCOUNTER — Ambulatory Visit: Payer: Medicaid Other | Admitting: Cardiology

## 2022-09-07 LAB — CBC
HCT: 35.5 % — ABNORMAL LOW (ref 39.0–52.0)
Hemoglobin: 11.7 g/dL — ABNORMAL LOW (ref 13.0–17.0)
MCH: 27.4 pg (ref 26.0–34.0)
MCHC: 33 g/dL (ref 30.0–36.0)
MCV: 83.1 fL (ref 80.0–100.0)
Platelets: 226 10*3/uL (ref 150–400)
RBC: 4.27 MIL/uL (ref 4.22–5.81)
RDW: 13.8 % (ref 11.5–15.5)
WBC: 10 10*3/uL (ref 4.0–10.5)
nRBC: 0 % (ref 0.0–0.2)

## 2022-09-07 LAB — BASIC METABOLIC PANEL
Anion gap: 6 (ref 5–15)
BUN: 6 mg/dL (ref 6–20)
CO2: 26 mmol/L (ref 22–32)
Calcium: 8.4 mg/dL — ABNORMAL LOW (ref 8.9–10.3)
Chloride: 111 mmol/L (ref 98–111)
Creatinine, Ser: 0.87 mg/dL (ref 0.61–1.24)
GFR, Estimated: >60 mL/min (ref >60)
Glucose, Bld: 96 mg/dL (ref 70–99)
Potassium: 3.9 mmol/L (ref 3.5–5.1)
Sodium: 143 mmol/L (ref 135–145)

## 2022-09-07 LAB — GLUCOSE, CAPILLARY: Glucose-Capillary: 86 mg/dL (ref 70–99)

## 2022-09-07 MED ORDER — OXYCODONE HCL 5 MG PO TABS
5.0000 mg | ORAL_TABLET | Freq: Four times a day (QID) | ORAL | 0 refills | Status: DC | PRN
  Filled 2022-09-07: qty 20, 5d supply, fill #0

## 2022-09-07 MED ORDER — IBUPROFEN 600 MG PO TABS
600.0000 mg | ORAL_TABLET | Freq: Four times a day (QID) | ORAL | 0 refills | Status: DC | PRN
  Filled 2022-09-07: qty 30, 8d supply, fill #0

## 2022-09-07 NOTE — Discharge Instructions (Signed)
In addition to included general post-operative instructions,  Diet: Resume home diet.   Activity: No heavy lifting >20 pounds (children, pets, laundry, garbage) or strenuous activity for 6 weeks, but light activity and walking are encouraged. Do not drive or drink alcohol if taking narcotic pain medications or having pain that might distract from driving.  Wound care: You may shower/get incision wet with soapy water and pat dry (do not rub incisions), but no baths or submerging incision underwater until follow-up.   Medications: Resume all home medications. For mild to moderate pain: acetaminophen (Tylenol) or ibuprofen/naproxen (if no kidney disease). Combining Tylenol with alcohol can substantially increase your risk of causing liver disease. Narcotic pain medications, if prescribed, can be used for severe pain, though may cause nausea, constipation, and drowsiness. Do not combine Tylenol and Percocet (or similar) within a 6 hour period as Percocet (and similar) contain(s) Tylenol. If you do not need the narcotic pain medication, you do not need to fill the prescription.  Call office [phone]) at any time if any questions, worsening pain, fevers/chills, bleeding, drainage from incision site, or other concerns.

## 2022-09-07 NOTE — Discharge Summary (Signed)
[facility] SURGICAL DISCHARGE SUMMARY  Patient ID: Brian Rios MRN: [id number] DOB/AGE: 1981/06/13 41 y.o.  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     HPI: Brian Rios is a 41 y.o. male with history of small bowel tumor who presents to [facility] on [facility]/14 for scheduled resection with Dr Dahlia Byes.   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 (Dr Dahlia Byes, 09/04/2022).  Post-operatively, patient did remarkably well. Advancement of patient's diet and ambulation were well-tolerated. The remainder of patient's hospital course was essentially unremarkable, and discharge planning was initiated accordingly with patient safely able to be discharged home with appropriate discharge instruction, pain control, and outpatient follow-up after all of his questions were answered to his expressed satisfaction.   Discharge Condition: Good   Physical Examination:  Constitutional: alert, cooperative and no distress  Respiratory: breathing non-labored at rest  Cardiovascular: regular rate and sinus rhythm  Gastrointestinal: Soft, incisional soreness, non-distended, no rebound/guarding Integumentary: Laparotomy is CDI with staples, no erythema    Allergies as of 09/07/2022   No Known Allergies      Medication List     STOP taking these medications    metroNIDAZOLE 500 MG tablet Commonly known as: Flagyl   neomycin 500 MG tablet Commonly known as: MYCIFRADIN   polyethylene glycol powder 17 GM/SCOOP powder Commonly known as:  MiraLax       TAKE these medications    ARIPiprazole ER 400 MG Prsy prefilled syringe Commonly known as: ABILIFY MAINTENA Inject 400 mg into the muscle every 30 (thirty) days.   ARIPiprazole 30 MG tablet Commonly known as: ABILIFY Take 1 tablet by mouth daily at bedtime (TAKE ONE TABLET BY MOUTH EVERY NIGHT AT BEDTIME)   atorvastatin 40 MG tablet Commonly known as: LIPITOR TAKE ONE TABLET BY MOUTH ONCE EVERY DAY. What changed:  how much to take when to take this   benztropine 0.5 MG tablet Commonly known as: COGENTIN Take 2 tablets ('1mg'$ ) by mouth at bedtime (Take 2 tablets by mouth at beddtime)   benztropine 1 MG tablet Commonly known as: COGENTIN Take 1 tablet by mouth daily at bedtime (1 po q hs)   divalproex 250 MG 24 hr tablet Commonly known as: DEPAKOTE ER Take 3 tablets by mouth daily at bedtime (3 po q hs)   escitalopram 20 MG tablet Commonly known as: LEXAPRO Take 1 tablet by mouth daily (1 po q day) What changed:  how much to take how to take this when to take this   hydrOXYzine 25 MG tablet Commonly known as: ATARAX Take 3 tablets by mouth daily at bedtime as needed (3 po q hs prn)   ibuprofen 600 MG tablet Commonly known as: ADVIL Take 1 tablet (600 mg total) by mouth every 6 (six) hours as needed.   Ingrezza 80 MG capsule Generic drug: valbenazine Take one capsule by mouth at bedtime.   metFORMIN 1000 MG tablet Commonly known as: GLUCOPHAGE Take (1/2) tablet (500 mg total) by mouth once daily with breakfast. What changed: when to take this   mirtazapine 45 MG tablet Commonly known as: REMERON Take 1  tablet by mouth daily at bedtime (1 po q hs)   oxyCODONE 5 MG immediate release tablet Commonly known as: Oxy IR/ROXICODONE Take 1 tablet (5 mg total) by mouth every 6 (six) hours as needed for breakthrough pain or severe pain.   tadalafil 5 MG tablet Commonly known as: CIALIS Take 2-4 tablets (10-20 mg total) by mouth once daily as  needed for erectile dysfunction. What changed: additional instructions          Follow-up Information     Tylene Fantasia, PA-C. Schedule an appointment as soon as possible for a visit in 10 day(s).   Specialty: Physician Assistant Why: s/p right hemicolectomy; has staples Contact information: [address] [place] [address] [phone]                  Time spent on discharge management including discussion of hospital course, clinical condition, outpatient instructions, prescriptions, and follow up with the patient and members of the medical team: >30 minutes  -- Edison Simon , PA-C [facility]  09/07/2022, 9:46 AM [phone] M-F: 7am - 4pm

## 2022-09-17 ENCOUNTER — Other Ambulatory Visit: Payer: Self-pay

## 2022-09-18 ENCOUNTER — Other Ambulatory Visit: Payer: Self-pay

## 2022-09-18 ENCOUNTER — Encounter: Payer: Self-pay | Admitting: Physician Assistant

## 2022-09-18 ENCOUNTER — Ambulatory Visit (INDEPENDENT_AMBULATORY_CARE_PROVIDER_SITE_OTHER): Payer: Self-pay | Admitting: Physician Assistant

## 2022-09-18 VITALS — BP 120/76 | HR 94 | Temp 98.1°F | Wt 230.0 lb

## 2022-09-18 DIAGNOSIS — K6389 Other specified diseases of intestine: Secondary | ICD-10-CM

## 2022-09-18 DIAGNOSIS — Z09 Encounter for follow-up examination after completed treatment for conditions other than malignant neoplasm: Secondary | ICD-10-CM

## 2022-09-18 MED ORDER — ESCITALOPRAM OXALATE 20 MG PO TABS
20.0000 mg | ORAL_TABLET | Freq: Every day | ORAL | 1 refills | Status: DC
  Filled 2022-10-17: qty 30, 30d supply, fill #0
  Filled 2022-11-19: qty 30, 30d supply, fill #1

## 2022-09-18 MED ORDER — LOPERAMIDE HCL 2 MG PO CAPS
2.0000 mg | ORAL_CAPSULE | Freq: Two times a day (BID) | ORAL | 0 refills | Status: DC
  Filled 2022-09-18: qty 30, 15d supply, fill #0

## 2022-09-18 MED ORDER — MIRTAZAPINE 45 MG PO TABS
45.0000 mg | ORAL_TABLET | Freq: Every evening | ORAL | 1 refills | Status: DC
  Filled 2022-10-17: qty 30, 30d supply, fill #0
  Filled 2022-11-19: qty 30, 30d supply, fill #1

## 2022-09-18 MED ORDER — DIVALPROEX SODIUM ER 250 MG PO TB24
750.0000 mg | ORAL_TABLET | Freq: Every evening | ORAL | 1 refills | Status: DC
  Filled 2022-09-18 – 2022-10-17 (×2): qty 90, 30d supply, fill #0
  Filled 2022-11-19: qty 90, 30d supply, fill #1

## 2022-09-18 MED ORDER — BENZTROPINE MESYLATE 1 MG PO TABS
1.0000 mg | ORAL_TABLET | Freq: Every evening | ORAL | 1 refills | Status: DC
  Filled 2022-10-17: qty 30, 30d supply, fill #0
  Filled 2022-11-19: qty 30, 30d supply, fill #1

## 2022-09-18 MED ORDER — HYDROXYZINE HCL 25 MG PO TABS
75.0000 mg | ORAL_TABLET | Freq: Every evening | ORAL | 1 refills | Status: DC | PRN
  Filled 2022-10-17: qty 90, 30d supply, fill #0

## 2022-09-18 MED ORDER — INGREZZA 80 MG PO CAPS: 80.0000 mg | ORAL_CAPSULE | Freq: Every day | ORAL | 1 refills | Status: DC

## 2022-09-18 MED ORDER — ARIPIPRAZOLE 30 MG PO TABS
30.0000 mg | ORAL_TABLET | Freq: Every evening | ORAL | 1 refills | Status: DC
  Filled 2022-10-17: qty 30, 30d supply, fill #0
  Filled 2022-11-19: qty 30, 30d supply, fill #1

## 2022-09-18 MED ORDER — IBUPROFEN 600 MG PO TABS
600.0000 mg | ORAL_TABLET | Freq: Four times a day (QID) | ORAL | 0 refills | Status: DC | PRN
  Filled 2022-09-18: qty 30, 8d supply, fill #0

## 2022-09-18 NOTE — Patient Instructions (Signed)
Please pick up your prescriptions at your pharmacy.   If you have any concerns or questions, please feel free to call our office. See follow up appointment below.   Minimally Invasive Small Bowel Resection, Care After The following information offers guidance on how to care for yourself after your procedure. Your health care provider may also give you more specific instructions. If you have problems or questions, contact your health care provider. What can I expect after the procedure? After the procedure, it is common to have: Pain or swelling in your abdomen. Tiredness (fatigue). Diarrhea. Nausea. Follow these instructions at home: Medicines Take over-the-counter and prescription medicines only as told by your health care provider. If you were prescribed antibiotics, use them as told by your health care provider. Do not stop using the antibiotic even if you start to feel better. Ask your health care provider if the medicine prescribed to you requires you to avoid driving or using machinery. Eating and drinking Follow instructions from your health care provider about what you may eat and drink. You may be instructed to: Work with a dietitian to find the best eating plan for you. Have fluids or eat a soft diet while your bowel heals. Eat foods that are easy to digest. Take supplements to get enough vitamins and minerals. Drink enough fluid to keep your urine pale yellow. Incision care  Follow instructions from your health care provider about how to take care of your incision areas. Make sure you: Wash your hands with soap and water for at least 20 seconds before and after you change your bandage (dressing). If soap and water are not available, use hand sanitizer. Change your dressing as told by your health care provider. Leave stitches (sutures), skin glue, or adhesive strips in place. These skin closures may need to stay in place for 2 weeks or longer. If adhesive strip edges start to  loosen and curl up, you may trim the loose edges. Do not remove adhesive strips completely unless your health care provider tells you to do that. Check your incision areas every day for signs of infection. Check for: Redness, swelling, or more pain. Fluid or blood. Warmth. Pus or a bad smell. Activity Rest as told by your health care provider. Do not sit for a long time without moving. Get up to take short walks every 1-2 hours. This will improve blood flow and breathing. Ask for help if you feel weak or unsteady. You may have to avoid lifting. Ask your health care provider how much you can safely lift. Return to your normal activities as told by your health care provider. Ask your health care provider what activities are safe for you. General instructions Do not use any products that contain nicotine or tobacco. These products include cigarettes, chewing tobacco, and vaping devices, such as e-cigarettes. These can delay healing after surgery. If you need help quitting, ask your health care provider. Do not take baths, swim, or use a hot tub until your health care provider approves. Ask your health care provider if you may take showers. You may only be allowed to take sponge baths. Wear compression stockings as told by your health care provider. These stockings help to prevent blood clots and reduce swelling in your legs. Do deep breathing and coughing exercises as told by your health care provider. If it hurts to cough, hold a pillow against your abdomen as you cough. If you were given a device to help you with your breathing (incentive spirometer),  use it as told by your health care provider. Keep all follow-up visits. Your health care provider will monitor your healing and digestion and watch for problems. Contact a health care provider if: You have pain that does not get better with medicine. You have stomach discomfort, such as: You do not feel like eating. You feel nauseous or you  vomit. You have diarrhea that gets worse. You have swelling of the abdomen that gets worse. You have a fever or chills. Your incision breaks open or you have any signs of infection. Your legs or arms become painful, red, or swollen. You have bleeding from the rectum. You cannot have a bowel movement or pass gas. Get help right away if: You have chest pain. You have trouble breathing. These symptoms may be an emergency. Get help right away. Call 911. Do not wait to see if the symptoms will go away. Do not drive yourself to the hospital. This information is not intended to replace advice given to you by your health care provider. Make sure you discuss any questions you have with your health care provider. Document Revised: 03/09/2022 Document Reviewed: 03/09/2022 Elsevier Patient Education  [place].

## 2022-09-18 NOTE — Progress Notes (Signed)
[facility] POST-OP OFFICE VISIT  09/18/2022  HPI: Brian Rios is a 41 y.o. 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 SMALL BOWEL AND RIGHT COLON; HEMICOLECTOMY:  - BENIGN ENTERIC AND COLONIC MUCOSA.  - BLAND MESENTERIC FIBROUS AND SPINDLE CELL PROLIFERATION, 5 X 3.9 X 2.4  CM.  - BENIGN VERMIFORM APPENDIX.  - TWENTY-ONE BENIGN LYMPH NODES, WITH 2 SURROUNDED BY FIBROBLASTIC  PROLIFERATION (0/21).  - NEGATIVE FOR DYSPLASIA AND MALIGNANCY.   B. SMALL BOWEL; PARTIAL RESECTION:  - BENIGN ENTERIC MUCOSA.  - BLAND MESENTERIC FIBROUS AND SPINDLE CELL PROLIFERATION, 3.5 X 2.6 X  1.6 CM.  - SIX BENIGN LYMPH NODES (0/6).  - NEGATIVE FOR DYSPLASIA AND MALIGNANCY.    Vital signs: BP 120/76   Pulse 94   Temp 98.1 F (36.7 C) (Oral)   Wt 230 lb (104.3 kg)   SpO2 97%   BMI 33.00 kg/m     Physical Exam: Constitutional: Well appearing male, NAD Abdomen: Soft, mild peri-incisional soreness (worse on left), non-distended, no rebound/guarding Skin: Laparotomy is well healed; staples in place (removed); there is a very small area of superficial skin dehiscence at the umbilicus, no depth, no drainage, no erythema throughout   Assessment/Plan: This is a 41 y.o. 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 09/18/2022, 12:22 PM M-F: 7am - 4pm

## 2022-09-20 ENCOUNTER — Other Ambulatory Visit: Payer: Self-pay

## 2022-09-20 ENCOUNTER — Ambulatory Visit: Payer: Medicaid Other | Admitting: Gerontology

## 2022-09-20 ENCOUNTER — Encounter: Payer: Self-pay | Admitting: Gerontology

## 2022-09-20 VITALS — BP 110/73 | HR 80 | Temp 97.6°F | Resp 16 | Ht 70.0 in | Wt 234.1 lb

## 2022-09-20 DIAGNOSIS — Z09 Encounter for follow-up examination after completed treatment for conditions other than malignant neoplasm: Secondary | ICD-10-CM | POA: Insufficient documentation

## 2022-09-20 DIAGNOSIS — R7303 Prediabetes: Secondary | ICD-10-CM

## 2022-09-20 MED ORDER — METFORMIN HCL ER 500 MG PO TB24
500.0000 mg | ORAL_TABLET | Freq: Every day | ORAL | 2 refills | Status: DC
  Filled 2022-09-20: qty 30, 30d supply, fill #0
  Filled 2022-10-17: qty 30, 30d supply, fill #1
  Filled 2022-11-19 (×2): qty 30, 30d supply, fill #2

## 2022-09-20 NOTE — Progress Notes (Signed)
Established Patient Office Visit  Subjective   Patient ID: Brian Rios, male    DOB: June 04, 1981  Age: 41 y.o. MRN: [id number]  Chief Complaint  Patient presents with   Follow-up    HPI  Brian Rios is a 41 y/o male who has history of Alcohol abuse, Depression, Drug abuse, CVA presents for routine follow up. 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  Staples out; steri-strips placed, - Imodium for diarrhea , - Pain control prn; refilled ibuprofen,- Reviewed wound care recommendation,  - Reviewed lifting restrictions; 6 weeks total,             - Reviewed surgical pathology as above; no malignancy ,  - We will see him again in 2-3 weeks for recheck. He states that he's compliant with his medications, denies side effects and continues to make healthy lifestyle changes. He denies erythema, discharge, from abdominal incision site, endorses mild pain to left lower abdominal quadrant if he coughs. He states that his mood is good, denies suicidal nor homicidal ideation. He states that he's doing well and offers no further complaint.    Review of Systems  Constitutional: Negative.   Eyes: Negative.   Respiratory: Negative.    Cardiovascular: Negative.   Gastrointestinal:  Positive for abdominal pain (mild pain to left lower quadrant when coughing) and diarrhea (was started on Immodium).  Skin:        Steri stripes to abdominal incision,   Neurological: Negative.   Psychiatric/Behavioral: Negative.        Objective:     BP 110/73 (BP Location: Right Arm, Patient Position: Sitting, Cuff Size: Large)   Pulse 80   Temp 97.6 F (36.4 C) (Oral)   Resp 16    Ht '5\' 10"'$  (1.778 m)   Wt 234 lb 1.6 oz (106.2 kg)   SpO2 97%   BMI 33.59 kg/m  BP Readings from Last 3 Encounters:  09/20/22 110/73  09/18/22 120/76  09/07/22 (!) 148/91   Wt Readings from Last 3 Encounters:  09/20/22 234 lb 1.6 oz (106.2 kg)  09/18/22 230 lb (104.3 kg)  09/04/22 238 lb (108 kg)      Physical Exam HENT:     Head: Normocephalic and atraumatic.     Mouth/Throat:     Mouth: Mucous membranes are moist.  Cardiovascular:     Rate and Rhythm: Normal rate and regular rhythm.     Pulses: Normal pulses.     Heart sounds: Normal heart sounds.  Pulmonary:     Effort: Pulmonary effort is normal.     Breath sounds: Normal breath sounds.  Abdominal:     General: Bowel sounds are normal.     Palpations: Abdomen is soft.  Skin:    Comments: Steri stripes present to abdominal incision, no erythema, nor discharge from site  Neurological:     General: No focal deficit present.     Mental Status: He is alert and oriented to person, place, and time.  Psychiatric:        Mood and Affect: Mood normal.        Behavior: Behavior normal.        Thought Content:  Thought content normal.        Judgment: Judgment normal.      No results found for any visits on 09/20/22.  Last CBC Lab Results  Component Value Date   WBC 10.0 09/07/2022   HGB 11.7 (L) 09/07/2022   HCT 35.5 (L) 09/07/2022   MCV 83.1 09/07/2022   MCH 27.4 09/07/2022   RDW 13.8 09/07/2022   PLT 226 32/95/1884   Last metabolic panel Lab Results  Component Value Date   GLUCOSE 96 09/07/2022   NA 143 09/07/2022   K 3.9 09/07/2022   CL 111 09/07/2022   CO2 26 09/07/2022   BUN 6 09/07/2022   CREATININE 0.87 09/07/2022   GFRNONAA >60 09/07/2022   CALCIUM 8.4 (L) 09/07/2022   PHOS 3.7 09/05/2022   PROT 5.9 (L) 07/18/2022   ALBUMIN 3.9 (L) 07/18/2022   LABGLOB 2.0 07/18/2022   AGRATIO 2.0 07/18/2022   BILITOT <0.2 07/18/2022   ALKPHOS 74 07/18/2022   AST 18 07/18/2022   ALT 21 07/18/2022    ANIONGAP 6 09/07/2022   Last lipids Lab Results  Component Value Date   CHOL 156 07/18/2022   HDL 51 07/18/2022   LDLCALC 87 07/18/2022   TRIG 100 07/18/2022   CHOLHDL 3.1 07/18/2022   Last hemoglobin A1c Lab Results  Component Value Date   HGBA1C 5.7 (H) 09/05/2022      The ASCVD Risk score (Arnett DK, et al., 2019) failed to calculate for the following reasons:   The patient has a prior MI or stroke diagnosis    Assessment & Plan:   1. Prediabetes - He will continue on current medication, low carb/non concentrated sweet diet and exercise as tolerated. - metFORMIN (GLUCOPHAGE-XR) 500 MG 24 hr tablet; Take 1 tablet (500 mg total) by mouth daily with breakfast.  Dispense: 30 tablet; Refill: 2  2. Hospital discharge follow-up - He was advised to follow up with hospital discharge instruction, keep appointment with General Surgery Dr Brian Rios on 10/03/22. He was advised to notify Surgeon with worsening lower quadrant abdominal pain.   Return in about 5 weeks (around 10/25/2022), or if symptoms worsen or fail to improve.    Brian Gleed Jerold Coombe, NP

## 2022-09-24 ENCOUNTER — Telehealth: Payer: Self-pay | Admitting: *Deleted

## 2022-09-24 NOTE — Telephone Encounter (Signed)
Patient called and had surgery on 09/04/22 Dr Dahlia Byes Small Bowel Resection/Partial colectomy and he wants to know when he can start giving Plasma again? Please call and advise

## 2022-09-24 NOTE — Telephone Encounter (Signed)
Patient notified that he does need to wait 6 weeks from surgery before donating any plasma to allow for full healing.

## 2022-10-03 ENCOUNTER — Ambulatory Visit (INDEPENDENT_AMBULATORY_CARE_PROVIDER_SITE_OTHER): Payer: Medicaid Other | Admitting: Surgery

## 2022-10-03 ENCOUNTER — Other Ambulatory Visit: Payer: Self-pay

## 2022-10-03 ENCOUNTER — Encounter: Payer: Self-pay | Admitting: Surgery

## 2022-10-03 VITALS — BP 146/84 | HR 81 | Temp 98.2°F | Ht 70.0 in | Wt 232.0 lb

## 2022-10-03 DIAGNOSIS — Z09 Encounter for follow-up examination after completed treatment for conditions other than malignant neoplasm: Secondary | ICD-10-CM

## 2022-10-03 DIAGNOSIS — K6389 Other specified diseases of intestine: Secondary | ICD-10-CM

## 2022-10-03 LAB — SURGICAL PATHOLOGY

## 2022-10-03 NOTE — Progress Notes (Signed)
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

## 2022-10-03 NOTE — Patient Instructions (Signed)
If you have any concerns or questions, please feel free to call our office. Follow up as needed.   Minimally Invasive Small Bowel Resection, Care After The following information offers guidance on how to care for yourself after your procedure. Your health care provider may also give you more specific instructions. If you have problems or questions, contact your health care provider. What can I expect after the procedure? After the procedure, it is common to have: Pain or swelling in your abdomen. Tiredness (fatigue). Diarrhea. Nausea. Follow these instructions at home: Medicines Take over-the-counter and prescription medicines only as told by your health care provider. If you were prescribed antibiotics, use them as told by your health care provider. Do not stop using the antibiotic even if you start to feel better. Ask your health care provider if the medicine prescribed to you requires you to avoid driving or using machinery. Eating and drinking Follow instructions from your health care provider about what you may eat and drink. You may be instructed to: Work with a dietitian to find the best eating plan for you. Have fluids or eat a soft diet while your bowel heals. Eat foods that are easy to digest. Take supplements to get enough vitamins and minerals. Drink enough fluid to keep your urine pale yellow. Incision care  Follow instructions from your health care provider about how to take care of your incision areas. Make sure you: Wash your hands with soap and water for at least 20 seconds before and after you change your bandage (dressing). If soap and water are not available, use hand sanitizer. Change your dressing as told by your health care provider. Leave stitches (sutures), skin glue, or adhesive strips in place. These skin closures may need to stay in place for 2 weeks or longer. If adhesive strip edges start to loosen and curl up, you may trim the loose edges. Do not remove  adhesive strips completely unless your health care provider tells you to do that. Check your incision areas every day for signs of infection. Check for: Redness, swelling, or more pain. Fluid or blood. Warmth. Pus or a bad smell. Activity Rest as told by your health care provider. Do not sit for a long time without moving. Get up to take short walks every 1-2 hours. This will improve blood flow and breathing. Ask for help if you feel weak or unsteady. You may have to avoid lifting. Ask your health care provider how much you can safely lift. Return to your normal activities as told by your health care provider. Ask your health care provider what activities are safe for you. General instructions Do not use any products that contain nicotine or tobacco. These products include cigarettes, chewing tobacco, and vaping devices, such as e-cigarettes. These can delay healing after surgery. If you need help quitting, ask your health care provider. Do not take baths, swim, or use a hot tub until your health care provider approves. Ask your health care provider if you may take showers. You may only be allowed to take sponge baths. Wear compression stockings as told by your health care provider. These stockings help to prevent blood clots and reduce swelling in your legs. Do deep breathing and coughing exercises as told by your health care provider. If it hurts to cough, hold a pillow against your abdomen as you cough. If you were given a device to help you with your breathing (incentive spirometer), use it as told by your health care provider. Keep all  follow-up visits. Your health care provider will monitor your healing and digestion and watch for problems. Contact a health care provider if: You have pain that does not get better with medicine. You have stomach discomfort, such as: You do not feel like eating. You feel nauseous or you vomit. You have diarrhea that gets worse. You have swelling of the  abdomen that gets worse. You have a fever or chills. Your incision breaks open or you have any signs of infection. Your legs or arms become painful, red, or swollen. You have bleeding from the rectum. You cannot have a bowel movement or pass gas. Get help right away if: You have chest pain. You have trouble breathing. These symptoms may be an emergency. Get help right away. Call 911. Do not wait to see if the symptoms will go away. Do not drive yourself to the hospital. This information is not intended to replace advice given to you by your health care provider. Make sure you discuss any questions you have with your health care provider. Document Revised: 03/09/2022 Document Reviewed: 03/09/2022 Elsevier Patient Education  [place].

## 2022-10-17 ENCOUNTER — Other Ambulatory Visit: Payer: Self-pay

## 2022-10-17 ENCOUNTER — Other Ambulatory Visit: Payer: Self-pay | Admitting: Gerontology

## 2022-10-17 DIAGNOSIS — Z8639 Personal history of other endocrine, nutritional and metabolic disease: Secondary | ICD-10-CM

## 2022-10-17 MED ORDER — ATORVASTATIN CALCIUM 40 MG PO TABS
40.0000 mg | ORAL_TABLET | Freq: Every day | ORAL | 0 refills | Status: DC
  Filled 2022-10-17: qty 30, 30d supply, fill #0

## 2022-10-17 NOTE — Telephone Encounter (Signed)
Must keep OV with Benjamine Mola, NP on 10/25/22 prior to additional refills

## 2022-10-25 ENCOUNTER — Other Ambulatory Visit: Payer: Self-pay

## 2022-10-25 ENCOUNTER — Encounter: Payer: Self-pay | Admitting: Gerontology

## 2022-10-25 ENCOUNTER — Ambulatory Visit: Payer: Medicaid Other | Admitting: Gerontology

## 2022-10-25 VITALS — BP 125/77 | HR 96 | Temp 97.5°F | Resp 16 | Ht 70.0 in | Wt 226.2 lb

## 2022-10-25 DIAGNOSIS — Z09 Encounter for follow-up examination after completed treatment for conditions other than malignant neoplasm: Secondary | ICD-10-CM

## 2022-10-25 NOTE — Progress Notes (Signed)
Established Patient Office Visit  Subjective   Patient ID: Brian Rios, male    DOB: January 08, 1981  Age: 42 y.o. MRN: [id number]  Chief Complaint  Patient presents with   Follow-up    HPI Brian Rios is a 42 y/o male who has history of Alcohol abuse, Depression, Drug abuse, CVA presents for routine follow up. He states that he's compliant with his medications, denies side effects and continues to make healthy lifestyle changes. 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 He states that his mood is good, denies suicidal nor homicidal ideation. Overall, he states that he's doing well and offers no further complaint.   Review of Systems  Constitutional: Negative.   Eyes: Negative.   Cardiovascular: Negative.   Gastrointestinal: Negative.   Neurological: Negative.   Psychiatric/Behavioral: Negative.        Objective:     BP 125/77 (BP Location: Left Arm, Patient Position: Sitting, Cuff Size: Large)   Pulse 96   Temp (!) 97.5 F (36.4 C) (Oral)   Resp 16   Ht '5\' 10"'$  (1.778 m)   Wt 226 lb 3.2 oz (102.6 kg)   SpO2 95%   BMI 32.46 kg/m  BP Readings from Last 3 Encounters:  10/25/22 125/77  10/03/22 (!) 146/84  09/20/22 110/73   Wt Readings from Last 3 Encounters:  10/25/22 226 lb 3.2 oz (102.6 kg)  10/03/22 232 lb (105.2 kg)  09/20/22 234 lb 1.6 oz (106.2 kg)      Physical Exam HENT:     Head: Normocephalic and atraumatic.  Eyes:     Pupils: Pupils are equal, round, and reactive to light.  Cardiovascular:     Rate and Rhythm: Normal rate and regular rhythm.     Pulses: Normal pulses.     Heart sounds: Normal heart sounds.  Pulmonary:     Effort: Pulmonary effort is normal.     Breath sounds: Normal breath sounds.  Abdominal:     General: Bowel sounds are normal.      Palpations: Abdomen is soft.  Skin:    General: Skin is warm.  Neurological:     General: No focal deficit present.     Mental Status: He is alert and oriented to person, place, and time. Mental status is at baseline.  Psychiatric:        Mood and Affect: Mood normal.        Behavior: Behavior normal.        Thought Content: Thought content normal.        Judgment: Judgment normal.      No results found for any visits on 10/25/22.  Last CBC Lab Results  Component Value Date   WBC 10.0 09/07/2022   HGB 11.7 (L) 09/07/2022   HCT 35.5 (L) 09/07/2022   MCV 83.1 09/07/2022   MCH 27.4 09/07/2022   RDW 13.8 09/07/2022   PLT 226 80/99/8338   Last metabolic panel Lab Results  Component Value Date   GLUCOSE 96 09/07/2022   NA 143 09/07/2022   K 3.9 09/07/2022   CL 111 09/07/2022   CO2 26 09/07/2022   BUN 6 09/07/2022   CREATININE 0.87 09/07/2022   GFRNONAA >60 09/07/2022   CALCIUM 8.4 (L) 09/07/2022   PHOS 3.7 09/05/2022   PROT 5.9 (L) 07/18/2022   ALBUMIN 3.9 (  L) 07/18/2022   LABGLOB 2.0 07/18/2022   AGRATIO 2.0 07/18/2022   BILITOT <0.2 07/18/2022   ALKPHOS 74 07/18/2022   AST 18 07/18/2022   ALT 21 07/18/2022   ANIONGAP 6 09/07/2022   Last lipids Lab Results  Component Value Date   CHOL 156 07/18/2022   HDL 51 07/18/2022   LDLCALC 87 07/18/2022   TRIG 100 07/18/2022   CHOLHDL 3.1 07/18/2022   Last hemoglobin A1c Lab Results  Component Value Date   HGBA1C 5.7 (H) 09/05/2022      The ASCVD Risk score (Arnett DK, et al., 2019) failed to calculate for the following reasons:   The patient has a prior MI or stroke diagnosis    Assessment & Plan:   1. Follow-up exam - He will continue current treatment regimen, and follow up as needed.   Return if symptoms worsen or fail to improve. He has no follow up appointment because he has active Medicaid. He was advised to call his new PCP and schedule an appointment. [place] wishes him well with his care.   Sherlin Sonier  Jerold Coombe, NP

## 2022-10-30 ENCOUNTER — Other Ambulatory Visit (HOSPITAL_COMMUNITY): Payer: Self-pay

## 2022-11-19 ENCOUNTER — Other Ambulatory Visit: Payer: Self-pay | Admitting: Gerontology

## 2022-11-19 ENCOUNTER — Other Ambulatory Visit: Payer: Self-pay

## 2022-11-19 DIAGNOSIS — Z8639 Personal history of other endocrine, nutritional and metabolic disease: Secondary | ICD-10-CM

## 2022-11-20 ENCOUNTER — Other Ambulatory Visit: Payer: Self-pay

## 2022-11-20 MED ORDER — BENZTROPINE MESYLATE 1 MG PO TABS
1.0000 mg | ORAL_TABLET | Freq: Every evening | ORAL | 1 refills | Status: DC
  Filled 2022-12-21: qty 30, 30d supply, fill #0

## 2022-11-20 MED ORDER — DIVALPROEX SODIUM ER 250 MG PO TB24
750.0000 mg | ORAL_TABLET | Freq: Every evening | ORAL | 1 refills | Status: DC
  Filled 2022-12-21: qty 90, 30d supply, fill #0

## 2022-11-20 MED ORDER — INGREZZA 80 MG PO CAPS
80.0000 mg | ORAL_CAPSULE | Freq: Every day | ORAL | 1 refills | Status: DC
  Filled 2022-11-20: qty 30, 30d supply, fill #0

## 2022-11-20 MED ORDER — MIRTAZAPINE 45 MG PO TABS
45.0000 mg | ORAL_TABLET | Freq: Every evening | ORAL | 1 refills | Status: DC
  Filled 2022-12-21: qty 30, 30d supply, fill #0

## 2022-11-20 MED ORDER — ESCITALOPRAM OXALATE 20 MG PO TABS
20.0000 mg | ORAL_TABLET | Freq: Every day | ORAL | 1 refills | Status: DC
  Filled 2022-12-21: qty 30, 30d supply, fill #0

## 2022-11-20 MED ORDER — ARIPIPRAZOLE 30 MG PO TABS
30.0000 mg | ORAL_TABLET | Freq: Every evening | ORAL | 1 refills | Status: DC
  Filled 2022-11-20 – 2022-12-21 (×2): qty 30, 30d supply, fill #0

## 2022-11-20 MED ORDER — HYDROXYZINE HCL 25 MG PO TABS
75.0000 mg | ORAL_TABLET | Freq: Every evening | ORAL | 1 refills | Status: DC | PRN
  Filled 2022-11-20: qty 90, 30d supply, fill #0
  Filled 2022-12-21: qty 90, 30d supply, fill #1

## 2022-11-20 MED FILL — Atorvastatin Calcium Tab 40 MG (Base Equivalent): ORAL | 30 days supply | Qty: 30 | Fill #0 | Status: AC

## 2022-11-21 ENCOUNTER — Other Ambulatory Visit: Payer: Self-pay

## 2022-11-22 ENCOUNTER — Other Ambulatory Visit: Payer: Self-pay

## 2022-11-22 ENCOUNTER — Ambulatory Visit (INDEPENDENT_AMBULATORY_CARE_PROVIDER_SITE_OTHER): Payer: Medicaid Other | Admitting: Physician Assistant

## 2022-11-22 ENCOUNTER — Encounter: Payer: Self-pay | Admitting: Physician Assistant

## 2022-11-22 VITALS — BP 124/72 | HR 114 | Temp 98.8°F | Ht 70.0 in | Wt 219.0 lb

## 2022-11-22 DIAGNOSIS — K6389 Other specified diseases of intestine: Secondary | ICD-10-CM

## 2022-11-22 DIAGNOSIS — Z09 Encounter for follow-up examination after completed treatment for conditions other than malignant neoplasm: Secondary | ICD-10-CM

## 2022-11-22 MED ORDER — METAMUCIL SMOOTH TEXTURE 58.6 % PO POWD
1.0000 | Freq: Every day | ORAL | 12 refills | Status: AC
  Filled 2022-11-22: qty 283, fill #0

## 2022-11-22 MED ORDER — LOPERAMIDE HCL 2 MG PO CAPS
4.0000 mg | ORAL_CAPSULE | Freq: Three times a day (TID) | ORAL | 1 refills | Status: AC
  Filled 2022-11-22: qty 180, 30d supply, fill #0

## 2022-11-22 NOTE — Progress Notes (Signed)
[facility] POST-OP OFFICE VISIT  11/22/2022  HPI: Brian Rios is a 42 y.o. 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 He states "I just take whatever is in my bag." Stools are watery in nature. Feels like "everything runs through him." No abdominal pain, nausea, emesis He is eating well Incision has healed well No other complaints.   Vital signs: BP 124/72   Pulse (!) 114   Temp 98.8 F (37.1 C)   Ht '5\' 10"'$  (1.778 m)   Wt 219 lb (99.3 kg)   SpO2 98%   BMI 31.42 kg/m    Physical Exam: Constitutional: Well appearing male, NAD Abdomen: Soft, non-tender, non-distended, no rebound/guarding Skin: Laparotomy is well healed; no erythema or drainage   Assessment/Plan: This is a 42 y.o. 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

## 2022-11-22 NOTE — Patient Instructions (Signed)
We are going to send you in a prescription for Imodium and you also need to take Metamucil.  You should concentrate on eating a high fiber diet.   Follow-up with our office as needed.  Please call and ask to speak with a nurse if you develop questions or concerns.   High-Fiber Eating Plan Fiber, also called dietary fiber, is a type of carbohydrate. It is found foods such as fruits, vegetables, whole grains, and beans. A high-fiber diet can have many health benefits. Your health care provider may recommend a high-fiber diet to help: Prevent constipation. Fiber can make your bowel movements more regular. Lower your cholesterol. Relieve the following conditions: Inflammation of veins in the anus (hemorrhoids). Inflammation of specific areas of the digestive tract (uncomplicated diverticulosis). A problem of the large intestine, also called the colon, that sometimes causes pain and diarrhea (irritable bowel syndrome, or IBS). Prevent overeating as part of a weight-loss plan. Prevent heart disease, type 2 diabetes, and certain cancers. What are tips for following this plan? Reading food labels  Check the nutrition facts label on food products for the amount of dietary fiber. Choose foods that have 5 grams of fiber or more per serving. The goals for recommended daily fiber intake include: Men (age 77 or younger): 34-38 g. Men (over age 63): 28-34 g. Women (age 56 or younger): 25-28 g. Women (over age 31): 22-25 g. Your daily fiber goal is _____________ g. Shopping Choose whole fruits and vegetables instead of processed forms, such as apple juice or applesauce. Choose a wide variety of high-fiber foods such as avocados, lentils, oats, and kidney beans. Read the nutrition facts label of the foods you choose. Be aware of foods with added fiber. These foods often have high sugar and sodium amounts per serving. Cooking Use whole-grain flour for baking and cooking. Cook with brown rice  instead of white rice. Meal planning Start the day with a breakfast that is high in fiber, such as a cereal that contains 5 g of fiber or more per serving. Eat breads and cereals that are made with whole-grain flour instead of refined flour or white flour. Eat brown rice, bulgur wheat, or millet instead of white rice. Use beans in place of meat in soups, salads, and pasta dishes. Be sure that half of the grains you eat each day are whole grains. General information You can get the recommended daily intake of dietary fiber by: Eating a variety of fruits, vegetables, grains, nuts, and beans. Taking a fiber supplement if you are not able to take in enough fiber in your diet. It is better to get fiber through food than from a supplement. Gradually increase how much fiber you consume. If you increase your intake of dietary fiber too quickly, you may have bloating, cramping, or gas. Drink plenty of water to help you digest fiber. Choose high-fiber snacks, such as berries, raw vegetables, nuts, and popcorn. What foods should I eat? Fruits Berries. Pears. Apples. Oranges. Avocado. Prunes and raisins. Dried figs. Vegetables Sweet potatoes. Spinach. Kale. Artichokes. Cabbage. Broccoli. Cauliflower. Green peas. Carrots. Squash. Grains Whole-grain breads. Multigrain cereal. Oats and oatmeal. Brown rice. Barley. Bulgur wheat. [place]. Quinoa. Bran muffins. Popcorn. Rye wafer crackers. Meats and other proteins Navy beans, kidney beans, and pinto beans. Soybeans. Split peas. Lentils. Nuts and seeds. Dairy Fiber-fortified yogurt. Beverages Fiber-fortified soy milk. Fiber-fortified orange juice. Other foods Fiber bars. The items listed above may not be a complete list of recommended foods and beverages. Contact a  dietitian for more information. What foods should I avoid? Fruits Fruit juice. Cooked, strained fruit. Vegetables Fried potatoes. Canned vegetables. Well-cooked vegetables. Grains White  bread. Pasta made with refined flour. White rice. Meats and other proteins Fatty cuts of meat. Fried chicken or fried fish. Dairy Milk. Yogurt. Cream cheese. Sour cream. Fats and oils Butters. Beverages Soft drinks. Other foods Cakes and pastries. The items listed above may not be a complete list of foods and beverages to avoid. Talk with your dietitian about what choices are best for you. Summary Fiber is a type of carbohydrate. It is found in foods such as fruits, vegetables, whole grains, and beans. A high-fiber diet has many benefits. It can help to prevent constipation, lower blood cholesterol, aid weight loss, and reduce your risk of heart disease, diabetes, and certain cancers. Increase your intake of fiber gradually. Increasing fiber too quickly may cause cramping, bloating, and gas. Drink plenty of water while you increase the amount of fiber you consume. The best sources of fiber include whole fruits and vegetables, whole grains, nuts, seeds, and beans. This information is not intended to replace advice given to you by your health care provider. Make sure you discuss any questions you have with your health care provider. Document Revised: 02/11/2020 Document Reviewed: 02/11/2020 Elsevier Patient Education  [place].

## 2022-12-21 ENCOUNTER — Other Ambulatory Visit: Payer: Self-pay

## 2022-12-24 ENCOUNTER — Other Ambulatory Visit: Payer: Self-pay

## 2023-01-16 ENCOUNTER — Other Ambulatory Visit: Payer: Self-pay

## 2023-01-23 ENCOUNTER — Ambulatory Visit: Payer: Medicaid Other | Admitting: Nurse Practitioner

## 2023-01-24 ENCOUNTER — Other Ambulatory Visit: Payer: Self-pay

## 2023-01-24 ENCOUNTER — Other Ambulatory Visit: Payer: Self-pay | Admitting: Physician Assistant

## 2023-01-24 MED ORDER — LOPERAMIDE HCL 2 MG PO CAPS: 4.0000 mg | ORAL_CAPSULE | Freq: Three times a day (TID) | ORAL | 1 refills | Status: DC | PRN

## 2023-03-11 ENCOUNTER — Other Ambulatory Visit: Payer: Self-pay

## 2023-03-13 ENCOUNTER — Ambulatory Visit: Payer: Medicaid Other | Admitting: Surgery

## 2023-03-20 ENCOUNTER — Ambulatory Visit: Payer: Medicaid Other | Admitting: Surgery

## 2023-03-21 ENCOUNTER — Emergency Department (HOSPITAL_COMMUNITY): Payer: Medicaid Other

## 2023-03-21 ENCOUNTER — Emergency Department (HOSPITAL_COMMUNITY): Payer: Medicaid Other | Admitting: Anesthesiology

## 2023-03-21 ENCOUNTER — Encounter (HOSPITAL_COMMUNITY)
Admission: EM | Disposition: A | Discharge status: Rehabilitation Facility | Payer: Self-pay | Source: Other Acute Inpatient Hospital | Attending: Neurology

## 2023-03-21 ENCOUNTER — Inpatient Hospital Stay: Admit: 2023-03-21 | Payer: Medicaid Other

## 2023-03-21 ENCOUNTER — Emergency Department: Payer: Medicaid Other

## 2023-03-21 ENCOUNTER — Encounter (HOSPITAL_COMMUNITY): Payer: Self-pay | Admitting: Certified Registered Nurse Anesthetist

## 2023-03-21 ENCOUNTER — Emergency Department
Admission: EM | Admit: 2023-03-21 | Discharge: 2023-03-21 | Disposition: A | Discharge status: Other Acute Inpatient Hospital | Payer: Medicaid Other | Attending: Emergency Medicine | Admitting: Emergency Medicine

## 2023-03-21 ENCOUNTER — Other Ambulatory Visit: Payer: Self-pay

## 2023-03-21 ENCOUNTER — Inpatient Hospital Stay (HOSPITAL_COMMUNITY)
Admission: EM | Admit: 2023-03-21 | Discharge: 2023-03-27 | DRG: 907 | Disposition: A | Discharge status: Rehabilitation Facility | Payer: Medicaid Other | Source: Other Acute Inpatient Hospital | Attending: Neurology | Admitting: Neurology

## 2023-03-21 DIAGNOSIS — Z79899 Other long term (current) drug therapy: Secondary | ICD-10-CM

## 2023-03-21 DIAGNOSIS — F209 Schizophrenia, unspecified: Secondary | ICD-10-CM | POA: Diagnosis present

## 2023-03-21 DIAGNOSIS — Z803 Family history of malignant neoplasm of breast: Secondary | ICD-10-CM

## 2023-03-21 DIAGNOSIS — I63312 Cerebral infarction due to thrombosis of left middle cerebral artery: Secondary | ICD-10-CM | POA: Diagnosis not present

## 2023-03-21 DIAGNOSIS — I6389 Other cerebral infarction: Secondary | ICD-10-CM | POA: Diagnosis not present

## 2023-03-21 DIAGNOSIS — Z20822 Contact with and (suspected) exposure to covid-19: Secondary | ICD-10-CM | POA: Diagnosis not present

## 2023-03-21 DIAGNOSIS — F3341 Major depressive disorder, recurrent, in partial remission: Secondary | ICD-10-CM | POA: Diagnosis not present

## 2023-03-21 DIAGNOSIS — I6300 Cerebral infarction due to thrombosis of unspecified precerebral artery: Secondary | ICD-10-CM | POA: Diagnosis not present

## 2023-03-21 DIAGNOSIS — T405X1A Poisoning by cocaine, accidental (unintentional), initial encounter: Secondary | ICD-10-CM | POA: Diagnosis present

## 2023-03-21 DIAGNOSIS — R531 Weakness: Secondary | ICD-10-CM | POA: Diagnosis present

## 2023-03-21 DIAGNOSIS — I6602 Occlusion and stenosis of left middle cerebral artery: Secondary | ICD-10-CM | POA: Diagnosis not present

## 2023-03-21 DIAGNOSIS — F32A Depression, unspecified: Secondary | ICD-10-CM | POA: Diagnosis present

## 2023-03-21 DIAGNOSIS — Y92009 Unspecified place in unspecified non-institutional (private) residence as the place of occurrence of the external cause: Secondary | ICD-10-CM

## 2023-03-21 DIAGNOSIS — R4702 Dysphasia: Secondary | ICD-10-CM | POA: Diagnosis present

## 2023-03-21 DIAGNOSIS — Z9049 Acquired absence of other specified parts of digestive tract: Secondary | ICD-10-CM | POA: Diagnosis not present

## 2023-03-21 DIAGNOSIS — R197 Diarrhea, unspecified: Secondary | ICD-10-CM | POA: Diagnosis not present

## 2023-03-21 DIAGNOSIS — F1410 Cocaine abuse, uncomplicated: Secondary | ICD-10-CM | POA: Diagnosis present

## 2023-03-21 DIAGNOSIS — Z8673 Personal history of transient ischemic attack (TIA), and cerebral infarction without residual deficits: Secondary | ICD-10-CM | POA: Diagnosis not present

## 2023-03-21 DIAGNOSIS — R29810 Facial weakness: Secondary | ICD-10-CM | POA: Diagnosis present

## 2023-03-21 DIAGNOSIS — F17210 Nicotine dependence, cigarettes, uncomplicated: Secondary | ICD-10-CM | POA: Diagnosis present

## 2023-03-21 DIAGNOSIS — I639 Cerebral infarction, unspecified: Secondary | ICD-10-CM | POA: Diagnosis not present

## 2023-03-21 DIAGNOSIS — F1210 Cannabis abuse, uncomplicated: Secondary | ICD-10-CM | POA: Diagnosis present

## 2023-03-21 DIAGNOSIS — R414 Neurologic neglect syndrome: Secondary | ICD-10-CM | POA: Diagnosis present

## 2023-03-21 DIAGNOSIS — F0631 Mood disorder due to known physiological condition with depressive features: Secondary | ICD-10-CM | POA: Diagnosis present

## 2023-03-21 DIAGNOSIS — Z8249 Family history of ischemic heart disease and other diseases of the circulatory system: Secondary | ICD-10-CM

## 2023-03-21 DIAGNOSIS — I10 Essential (primary) hypertension: Secondary | ICD-10-CM | POA: Diagnosis present

## 2023-03-21 DIAGNOSIS — Z7982 Long term (current) use of aspirin: Secondary | ICD-10-CM

## 2023-03-21 DIAGNOSIS — R4701 Aphasia: Secondary | ICD-10-CM | POA: Diagnosis present

## 2023-03-21 DIAGNOSIS — Z811 Family history of alcohol abuse and dependence: Secondary | ICD-10-CM | POA: Diagnosis not present

## 2023-03-21 DIAGNOSIS — Z833 Family history of diabetes mellitus: Secondary | ICD-10-CM

## 2023-03-21 DIAGNOSIS — R1310 Dysphagia, unspecified: Secondary | ICD-10-CM | POA: Diagnosis present

## 2023-03-21 DIAGNOSIS — J189 Pneumonia, unspecified organism: Secondary | ICD-10-CM | POA: Diagnosis present

## 2023-03-21 DIAGNOSIS — Z83438 Family history of other disorder of lipoprotein metabolism and other lipidemia: Secondary | ICD-10-CM

## 2023-03-21 DIAGNOSIS — R482 Apraxia: Secondary | ICD-10-CM | POA: Diagnosis present

## 2023-03-21 DIAGNOSIS — Z7984 Long term (current) use of oral hypoglycemic drugs: Secondary | ICD-10-CM

## 2023-03-21 DIAGNOSIS — I63512 Cerebral infarction due to unspecified occlusion or stenosis of left middle cerebral artery: Secondary | ICD-10-CM

## 2023-03-21 DIAGNOSIS — Z1152 Encounter for screening for COVID-19: Secondary | ICD-10-CM | POA: Diagnosis not present

## 2023-03-21 DIAGNOSIS — R29709 NIHSS score 9: Secondary | ICD-10-CM | POA: Diagnosis present

## 2023-03-21 DIAGNOSIS — F1010 Alcohol abuse, uncomplicated: Secondary | ICD-10-CM | POA: Diagnosis present

## 2023-03-21 DIAGNOSIS — I63032 Cerebral infarction due to thrombosis of left carotid artery: Secondary | ICD-10-CM | POA: Diagnosis not present

## 2023-03-21 DIAGNOSIS — E785 Hyperlipidemia, unspecified: Secondary | ICD-10-CM | POA: Diagnosis present

## 2023-03-21 DIAGNOSIS — E876 Hypokalemia: Secondary | ICD-10-CM | POA: Diagnosis present

## 2023-03-21 DIAGNOSIS — R471 Dysarthria and anarthria: Secondary | ICD-10-CM | POA: Diagnosis present

## 2023-03-21 HISTORY — PX: IR CT HEAD LTD: IMG2386

## 2023-03-21 HISTORY — PX: IR PERCUTANEOUS ART THROMBECTOMY/INFUSION INTRACRANIAL INC DIAG ANGIO: IMG6087

## 2023-03-21 HISTORY — PX: RADIOLOGY WITH ANESTHESIA: SHX6223

## 2023-03-21 LAB — COMPREHENSIVE METABOLIC PANEL
ALT: 17 U/L (ref 0–44)
AST: 20 U/L (ref 15–41)
Albumin: 2.9 g/dL — ABNORMAL LOW (ref 3.5–5.0)
Alkaline Phosphatase: 52 U/L (ref 38–126)
Anion gap: 5 (ref 5–15)
BUN: 9 mg/dL (ref 6–20)
CO2: 24 mmol/L (ref 22–32)
Calcium: 8.1 mg/dL — ABNORMAL LOW (ref 8.9–10.3)
Chloride: 109 mmol/L (ref 98–111)
Creatinine, Ser: 1.13 mg/dL (ref 0.61–1.24)
GFR, Estimated: >60 mL/min (ref >60)
Glucose, Bld: 127 mg/dL — ABNORMAL HIGH (ref 70–99)
Potassium: 3.2 mmol/L — ABNORMAL LOW (ref 3.5–5.1)
Sodium: 138 mmol/L (ref 135–145)
Total Bilirubin: 0.6 mg/dL (ref 0.3–1.2)
Total Protein: 5.4 g/dL — ABNORMAL LOW (ref 6.5–8.1)

## 2023-03-21 LAB — GLUCOSE, CAPILLARY
Glucose-Capillary: 180 mg/dL — ABNORMAL HIGH (ref 70–99)
Glucose-Capillary: 98 mg/dL (ref 70–99)

## 2023-03-21 LAB — DIFFERENTIAL
Abs Immature Granulocytes: 0.14 10*3/uL — ABNORMAL HIGH (ref 0.00–0.07)
Basophils Absolute: 0.1 10*3/uL (ref 0.0–0.1)
Basophils Relative: 1 %
Eosinophils Absolute: 0.1 10*3/uL (ref 0.0–0.5)
Eosinophils Relative: 1 %
Immature Granulocytes: 1 %
Lymphocytes Relative: 16 %
Lymphs Abs: 2.2 10*3/uL (ref 0.7–4.0)
Monocytes Absolute: 1 10*3/uL (ref 0.1–1.0)
Monocytes Relative: 7 %
Neutro Abs: 10 10*3/uL — ABNORMAL HIGH (ref 1.7–7.7)
Neutrophils Relative %: 74 %

## 2023-03-21 LAB — I-STAT ARTERIAL BLOOD GAS, ED
Acid-base deficit: 7 mmol/L — ABNORMAL HIGH (ref 0.0–2.0)
Acid-base deficit: 7 mmol/L — ABNORMAL HIGH (ref 0.0–2.0)
Acid-base deficit: 7 mmol/L — ABNORMAL HIGH (ref 0.0–2.0)
Bicarbonate: 17.9 mmol/L — ABNORMAL LOW (ref 20.0–28.0)
Bicarbonate: 18.1 mmol/L — ABNORMAL LOW (ref 20.0–28.0)
Bicarbonate: 17.6 mmol/L — ABNORMAL LOW (ref 20.0–28.0)
Calcium, Ion: 1.11 mmol/L — ABNORMAL LOW (ref 1.15–1.40)
Calcium, Ion: 1.12 mmol/L — ABNORMAL LOW (ref 1.15–1.40)
Calcium, Ion: 1.13 mmol/L — ABNORMAL LOW (ref 1.15–1.40)
HCT: 47 % (ref 39.0–52.0)
HCT: 47 % (ref 39.0–52.0)
HCT: 47 % (ref 39.0–52.0)
Hemoglobin: 16 g/dL (ref 13.0–17.0)
Hemoglobin: 16 g/dL (ref 13.0–17.0)
Hemoglobin: 16 g/dL (ref 13.0–17.0)
O2 Saturation: 86 %
O2 Saturation: 89 %
O2 Saturation: 93 %
Potassium: 3.6 mmol/L (ref 3.5–5.1)
Potassium: 3.6 mmol/L (ref 3.5–5.1)
Potassium: 3.5 mmol/L (ref 3.5–5.1)
Sodium: 137 mmol/L (ref 135–145)
Sodium: 138 mmol/L (ref 135–145)
Sodium: 138 mmol/L (ref 135–145)
TCO2: 19 mmol/L — ABNORMAL LOW (ref 22–32)
TCO2: 19 mmol/L — ABNORMAL LOW (ref 22–32)
TCO2: 19 mmol/L — ABNORMAL LOW (ref 22–32)
pCO2 arterial: 33.6 mmHg (ref 32–48)
pCO2 arterial: 36.7 mmHg (ref 32–48)
pCO2 arterial: 31.7 mmHg — ABNORMAL LOW (ref 32–48)
pH, Arterial: 7.302 — ABNORMAL LOW (ref 7.35–7.45)
pH, Arterial: 7.336 — ABNORMAL LOW (ref 7.35–7.45)
pH, Arterial: 7.352 (ref 7.35–7.45)
pO2, Arterial: 57 mmHg — ABNORMAL LOW (ref 83–108)
pO2, Arterial: 60 mmHg — ABNORMAL LOW (ref 83–108)
pO2, Arterial: 69 mmHg — ABNORMAL LOW (ref 83–108)

## 2023-03-21 LAB — CBC
HCT: 44.2 % (ref 39.0–52.0)
Hemoglobin: 14.4 g/dL (ref 13.0–17.0)
MCH: 27.6 pg (ref 26.0–34.0)
MCHC: 32.6 g/dL (ref 30.0–36.0)
MCV: 84.7 fL (ref 80.0–100.0)
Platelets: 232 10*3/uL (ref 150–400)
RBC: 5.22 MIL/uL (ref 4.22–5.81)
RDW: 14.5 % (ref 11.5–15.5)
WBC: 13.4 10*3/uL — ABNORMAL HIGH (ref 4.0–10.5)
nRBC: 0 % (ref 0.0–0.2)

## 2023-03-21 LAB — CBG MONITORING, ED: Glucose-Capillary: 123 mg/dL — ABNORMAL HIGH (ref 70–99)

## 2023-03-21 LAB — PROTIME-INR
INR: 1 (ref 0.8–1.2)
Prothrombin Time: 13.5 seconds (ref 11.4–15.2)

## 2023-03-21 LAB — APTT: aPTT: 30 seconds (ref 24–36)

## 2023-03-21 LAB — ETHANOL: Alcohol, Ethyl (B): <10 mg/dL (ref <10)

## 2023-03-21 LAB — SARS CORONAVIRUS 2 BY RT PCR: SARS Coronavirus 2 by RT PCR: NEGATIVE

## 2023-03-21 SURGERY — RADIOLOGY WITH ANESTHESIA
Anesthesia: General

## 2023-03-21 MED ORDER — LACTATED RINGERS IV SOLN: INTRAVENOUS | Status: DC | PRN

## 2023-03-21 MED ORDER — ASPIRIN 81 MG PO CHEW: 81.0000 mg | CHEWABLE_TABLET | Freq: Every day | ORAL | Status: DC

## 2023-03-21 MED ORDER — CANGRELOR TETRASODIUM 50 MG IV SOLR
INTRAVENOUS | Status: AC
  Filled 2023-03-21: qty 50

## 2023-03-21 MED ORDER — DEXMEDETOMIDINE HCL IN NACL 200 MCG/50ML IV SOLN
INTRAVENOUS | Status: AC
  Filled 2023-03-21: qty 50

## 2023-03-21 MED ORDER — BENZTROPINE MESYLATE 1 MG PO TABS
1.0000 mg | ORAL_TABLET | Freq: Every day | ORAL | Status: DC
  Administered 2023-03-22 – 2023-03-26 (×5): 1 mg via ORAL
  Filled 2023-03-21 (×7): qty 1

## 2023-03-21 MED ORDER — SUCCINYLCHOLINE CHLORIDE 200 MG/10ML IV SOSY
PREFILLED_SYRINGE | INTRAVENOUS | Status: DC | PRN
  Administered 2023-03-21: 60 mg via INTRAVENOUS

## 2023-03-21 MED ORDER — SODIUM CHLORIDE 0.9 % IV SOLN: INTRAVENOUS | Status: DC

## 2023-03-21 MED ORDER — FENTANYL CITRATE (PF) 100 MCG/2ML IJ SOLN
INTRAMUSCULAR | Status: AC
  Filled 2023-03-21: qty 2

## 2023-03-21 MED ORDER — CLEVIDIPINE BUTYRATE 0.5 MG/ML IV EMUL
INTRAVENOUS | Status: AC
  Filled 2023-03-21: qty 50

## 2023-03-21 MED ORDER — IOHEXOL 350 MG/ML SOLN
100.0000 mL | Freq: Once | INTRAVENOUS | Status: AC | PRN
  Administered 2023-03-21: 100 mL via INTRAVENOUS

## 2023-03-21 MED ORDER — SODIUM CHLORIDE 0.9 % IV SOLN: Freq: Once | INTRAVENOUS | Status: AC

## 2023-03-21 MED ORDER — VALPROATE SODIUM 100 MG/ML IV SOLN
250.0000 mg | Freq: Three times a day (TID) | INTRAVENOUS | Status: AC
  Administered 2023-03-22 – 2023-03-23 (×6): 250 mg via INTRAVENOUS
  Filled 2023-03-21: qty 2.5
  Filled 2023-03-21 (×3): qty 250
  Filled 2023-03-21 (×4): qty 2.5

## 2023-03-21 MED ORDER — NITROGLYCERIN 1 MG/10 ML FOR IR/CATH LAB
INTRA_ARTERIAL | Status: AC | PRN
  Administered 2023-03-21 (×4): 25 ug via INTRA_ARTERIAL

## 2023-03-21 MED ORDER — INSULIN ASPART 100 UNIT/ML IJ SOLN
0.0000 [IU] | Freq: Three times a day (TID) | INTRAMUSCULAR | Status: DC
  Administered 2023-03-24: 2 [IU] via SUBCUTANEOUS

## 2023-03-21 MED ORDER — VALBENAZINE TOSYLATE 40 MG PO CAPS: 80.0000 mg | ORAL_CAPSULE | Freq: Every day | ORAL | Status: DC

## 2023-03-21 MED ORDER — POTASSIUM CHLORIDE 10 MEQ/100ML IV SOLN: 10.0000 meq | Freq: Once | INTRAVENOUS | Status: DC

## 2023-03-21 MED ORDER — IOHEXOL 300 MG/ML  SOLN
150.0000 mL | Freq: Once | INTRAMUSCULAR | Status: AC | PRN
  Administered 2023-03-21: 60 mL via INTRA_ARTERIAL

## 2023-03-21 MED ORDER — SODIUM CHLORIDE 0.9 % IV BOLUS
250.0000 mL | INTRAVENOUS | Status: DC | PRN
  Administered 2023-03-22: 250 mL via INTRAVENOUS

## 2023-03-21 MED ORDER — SODIUM CHLORIDE (PF) 0.9 % IJ SOLN
INTRAVENOUS | Status: AC | PRN
  Administered 2023-03-21 (×5): 25 ug via INTRA_ARTERIAL

## 2023-03-21 MED ORDER — DEXMEDETOMIDINE HCL IN NACL 200 MCG/50ML IV SOLN
INTRAVENOUS | Status: DC | PRN
  Administered 2023-03-21: 16 ug via INTRAVENOUS

## 2023-03-21 MED ORDER — TICAGRELOR 90 MG PO TABS
ORAL_TABLET | ORAL | Status: AC
  Filled 2023-03-21: qty 1

## 2023-03-21 MED ORDER — POTASSIUM CHLORIDE 10 MEQ/100ML IV SOLN: 10.0000 meq | INTRAVENOUS | Status: DC

## 2023-03-21 MED ORDER — ACETAMINOPHEN 650 MG RE SUPP: 650.0000 mg | RECTAL | Status: DC | PRN

## 2023-03-21 MED ORDER — ROCURONIUM BROMIDE 10 MG/ML (PF) SYRINGE
PREFILLED_SYRINGE | INTRAVENOUS | Status: DC | PRN
  Administered 2023-03-21: 40 mg via INTRAVENOUS
  Administered 2023-03-21: 10 mg via INTRAVENOUS
  Administered 2023-03-21: 20 mg via INTRAVENOUS
  Administered 2023-03-21: 10 mg via INTRAVENOUS
  Administered 2023-03-21 (×2): 20 mg via INTRAVENOUS

## 2023-03-21 MED ORDER — FENTANYL CITRATE (PF) 100 MCG/2ML IJ SOLN
INTRAMUSCULAR | Status: DC | PRN
  Administered 2023-03-21 (×2): 50 ug via INTRAVENOUS

## 2023-03-21 MED ORDER — LABETALOL HCL 5 MG/ML IV SOLN
INTRAVENOUS | Status: DC | PRN
  Administered 2023-03-21 (×3): 5 mg via INTRAVENOUS
  Administered 2023-03-21: 2.5 mg via INTRAVENOUS
  Administered 2023-03-21: 5 mg via INTRAVENOUS
  Administered 2023-03-21 (×5): 2.5 mg via INTRAVENOUS
  Administered 2023-03-21: 5 mg via INTRAVENOUS
  Administered 2023-03-21 (×2): 2.5 mg via INTRAVENOUS

## 2023-03-21 MED ORDER — ATORVASTATIN CALCIUM 40 MG PO TABS
40.0000 mg | ORAL_TABLET | Freq: Every day | ORAL | Status: DC
  Administered 2023-03-22 – 2023-03-27 (×6): 40 mg via ORAL
  Filled 2023-03-21 (×6): qty 1

## 2023-03-21 MED ORDER — TICAGRELOR 90 MG PO TABS
90.0000 mg | ORAL_TABLET | Freq: Two times a day (BID) | ORAL | Status: DC
  Filled 2023-03-21 (×3): qty 1

## 2023-03-21 MED ORDER — ESCITALOPRAM OXALATE 10 MG PO TABS
20.0000 mg | ORAL_TABLET | Freq: Every day | ORAL | Status: DC
  Administered 2023-03-22 – 2023-03-27 (×6): 20 mg via ORAL
  Filled 2023-03-21 (×6): qty 2

## 2023-03-21 MED ORDER — FENTANYL CITRATE (PF) 250 MCG/5ML IJ SOLN: INTRAMUSCULAR | Status: DC | PRN

## 2023-03-21 MED ORDER — ASPIRIN 81 MG PO CHEW
CHEWABLE_TABLET | ORAL | Status: AC | PRN
  Administered 2023-03-21: 81 mg

## 2023-03-21 MED ORDER — IOHEXOL 300 MG/ML  SOLN
100.0000 mL | Freq: Once | INTRAMUSCULAR | Status: AC | PRN
  Administered 2023-03-21: 12 mL via INTRA_ARTERIAL

## 2023-03-21 MED ORDER — SODIUM CHLORIDE 0.9 % IV SOLN: INTRAVENOUS | Status: DC | PRN

## 2023-03-21 MED ORDER — TICAGRELOR 60 MG PO TABS
ORAL_TABLET | ORAL | Status: AC | PRN
  Administered 2023-03-21: 90 mg via ORAL

## 2023-03-21 MED ORDER — ACETAMINOPHEN 160 MG/5ML PO SOLN: 650.0000 mg | ORAL | Status: DC | PRN

## 2023-03-21 MED ORDER — DIVALPROEX SODIUM ER 500 MG PO TB24: 750.0000 mg | ORAL_TABLET | Freq: Every day | ORAL | Status: DC

## 2023-03-21 MED ORDER — SENNOSIDES-DOCUSATE SODIUM 8.6-50 MG PO TABS: 1.0000 | ORAL_TABLET | Freq: Every evening | ORAL | Status: DC | PRN

## 2023-03-21 MED ORDER — STROKE: EARLY STAGES OF RECOVERY BOOK
Freq: Once | Status: AC
  Filled 2023-03-21: qty 1

## 2023-03-21 MED ORDER — NITROGLYCERIN 1 MG/10 ML FOR IR/CATH LAB
INTRA_ARTERIAL | Status: AC
  Filled 2023-03-21: qty 10

## 2023-03-21 MED ORDER — CEFAZOLIN SODIUM-DEXTROSE 2-4 GM/100ML-% IV SOLN
INTRAVENOUS | Status: AC
  Filled 2023-03-21: qty 100

## 2023-03-21 MED ORDER — CLEVIDIPINE BUTYRATE 0.5 MG/ML IV EMUL
INTRAVENOUS | Status: DC | PRN
  Administered 2023-03-21: 2 mg/h via INTRAVENOUS

## 2023-03-21 MED ORDER — VERAPAMIL HCL 2.5 MG/ML IV SOLN
INTRAVENOUS | Status: AC | PRN
  Administered 2023-03-21: 2.5 mg via INTRA_ARTERIAL

## 2023-03-21 MED ORDER — HYDRALAZINE HCL 20 MG/ML IJ SOLN
INTRAMUSCULAR | Status: DC | PRN
  Administered 2023-03-21 (×3): 5 mg via INTRAVENOUS

## 2023-03-21 MED ORDER — CLEVIDIPINE BUTYRATE 0.5 MG/ML IV EMUL
0.0000 mg/h | INTRAVENOUS | Status: DC
  Administered 2023-03-21: 8 mg/h via INTRAVENOUS
  Filled 2023-03-21: qty 100
  Filled 2023-03-21: qty 50

## 2023-03-21 MED ORDER — HEPARIN SODIUM (PORCINE) 1000 UNIT/ML IJ SOLN
INTRAMUSCULAR | Status: AC
  Filled 2023-03-21: qty 10

## 2023-03-21 MED ORDER — ASPIRIN 81 MG PO CHEW
81.0000 mg | CHEWABLE_TABLET | Freq: Every day | ORAL | Status: DC
  Administered 2023-03-22 – 2023-03-27 (×6): 81 mg via ORAL
  Filled 2023-03-21 (×6): qty 1

## 2023-03-21 MED ORDER — ACETAMINOPHEN 325 MG PO TABS: 650.0000 mg | ORAL_TABLET | ORAL | Status: DC | PRN

## 2023-03-21 MED ORDER — MIRTAZAPINE 15 MG PO TABS
45.0000 mg | ORAL_TABLET | Freq: Every day | ORAL | Status: DC
  Administered 2023-03-22 – 2023-03-26 (×5): 45 mg via ORAL
  Filled 2023-03-21 (×5): qty 3

## 2023-03-21 MED ORDER — VERAPAMIL HCL 2.5 MG/ML IV SOLN
INTRAVENOUS | Status: AC
  Filled 2023-03-21: qty 2

## 2023-03-21 MED ORDER — TICAGRELOR 90 MG PO TABS
90.0000 mg | ORAL_TABLET | Freq: Two times a day (BID) | ORAL | Status: DC
  Administered 2023-03-22 – 2023-03-27 (×11): 90 mg via ORAL
  Filled 2023-03-21 (×12): qty 1

## 2023-03-21 MED ORDER — ONDANSETRON HCL 4 MG/2ML IJ SOLN
INTRAMUSCULAR | Status: DC | PRN
  Administered 2023-03-21: 4 mg via INTRAVENOUS

## 2023-03-21 MED ORDER — CEFAZOLIN SODIUM-DEXTROSE 2-4 GM/100ML-% IV SOLN
INTRAVENOUS | Status: AC | PRN
  Administered 2023-03-21: 2 g via INTRAVENOUS

## 2023-03-21 MED ORDER — ASPIRIN 81 MG PO CHEW
CHEWABLE_TABLET | ORAL | Status: AC
  Filled 2023-03-21: qty 1

## 2023-03-21 MED ORDER — ACETAMINOPHEN 325 MG PO TABS
650.0000 mg | ORAL_TABLET | ORAL | Status: DC | PRN
  Administered 2023-03-24: 650 mg via ORAL
  Filled 2023-03-21: qty 2

## 2023-03-21 MED ORDER — SUGAMMADEX SODIUM 200 MG/2ML IV SOLN
INTRAVENOUS | Status: DC | PRN
  Administered 2023-03-21: 200 mg via INTRAVENOUS

## 2023-03-21 MED ORDER — PROPOFOL 10 MG/ML IV BOLUS
INTRAVENOUS | Status: DC | PRN
  Administered 2023-03-21: 150 mg via INTRAVENOUS

## 2023-03-21 MED ORDER — LIDOCAINE 2% (20 MG/ML) 5 ML SYRINGE
INTRAMUSCULAR | Status: DC | PRN
  Administered 2023-03-21: 60 mg via INTRAVENOUS

## 2023-03-21 MED ORDER — ARIPIPRAZOLE 10 MG PO TABS
30.0000 mg | ORAL_TABLET | Freq: Every day | ORAL | Status: DC
  Administered 2023-03-22 – 2023-03-26 (×5): 30 mg via ORAL
  Filled 2023-03-21 (×7): qty 3

## 2023-03-21 NOTE — ED Provider Notes (Signed)
[facility] Provider Note    Event Date/Time   First MD Initiated Contact with Patient 03/21/23 1517     (approximate)   History   Code Stroke   HPI  Brian Rios is a 42 y.o. male who presents to the emergency department today because of concerns for difficulty with speech and right-sided weakness.  Symptoms reportedly started around noon today.  Patient himself is unable to give any significant history. Per chart review patient had an admission in 2021 for CVA.     Physical Exam   Triage Vital Signs: ED Triage Vitals [03/21/23 1525]  Enc Vitals Group     BP 129/83     Pulse Rate 92     Resp 18     Temp 98.9 F (37.2 C)     Temp Source Oral     SpO2 95 %     Weight 218 lb 14.7 oz (99.3 kg)     Height 5\' 10"  (1.778 m)     Head Circumference      Peak Flow      Pain Score 0     Pain Loc      Pain Edu?      Excl. in GC?     Most recent vital signs: Vitals:   03/21/23 1525 03/21/23 1608  BP: 129/83 (!) 145/95  Pulse: 92 89  Resp: 18 19  Temp: 98.9 F (37.2 C)   SpO2: 95% 98%   General: Awake, alert, aphagic CV:  Good peripheral perfusion. Regular rate and rhythm. Resp:  Normal effort. Lungs clear. Abd:  No distention.    ED Results / Procedures / Treatments   Labs (all labs ordered are listed, but only abnormal results are displayed) Labs Reviewed  CBC - Abnormal; Notable for the following components:      Result Value   WBC 13.4 (*)    All other components within normal limits  DIFFERENTIAL - Abnormal; Notable for the following components:   Neutro Abs 10.0 (*)    Abs Immature Granulocytes 0.14 (*)    All other components within normal limits  COMPREHENSIVE METABOLIC PANEL - Abnormal; Notable for the following components:   Potassium 3.2 (*)    Glucose, Bld 127 (*)    Calcium 8.1 (*)    Total Protein 5.4 (*)    Albumin 2.9 (*)    All other components within normal limits  CBG MONITORING, ED - Abnormal; Notable  for the following components:   Glucose-Capillary 123 (*)    All other components within normal limits  SARS CORONAVIRUS 2 BY RT PCR  ETHANOL  PROTIME-INR  APTT  URINE DRUG SCREEN, QUALITATIVE ([facility] ONLY)  URINALYSIS, ROUTINE W REFLEX MICROSCOPIC     EKG  I, Phineas Semen, attending physician, personally viewed and interpreted this EKG  EKG Time: 1615 Rate: 95 Rhythm: sinus rhythm Axis: normal Intervals: qtc 424 QRS: narrow ST changes: no st elevation Impression: normal ekg    RADIOLOGY I independently interpreted and visualized the CT head. My interpretation: no bleed Radiology interpretation:  IMPRESSION:  Findings are worrisome for an acute infarct in the posterior left  parietal lobe. Aspects is 9. No hemorrhage.    Findings were paged to Dr. Iver Nestle on 03/21/23 at 3:29 PM via [facility]  paging system.    PROCEDURES:  Critical Care performed: No   MEDICATIONS ORDERED IN ED: Medications  potassium chloride 10 mEq in 100 mL IVPB (has no administration in time range)  iohexol (OMNIPAQUE) 350 MG/ML injection 100 mL (100 mLs Intravenous Contrast Given 03/21/23 1528)  0.9 %  sodium chloride infusion ( Intravenous New Bag/Given 03/21/23 1636)     IMPRESSION / MDM / ASSESSMENT AND PLAN / ED COURSE  I reviewed the triage vital signs and the nursing notes.                              Differential diagnosis includes, but is not limited to, CVA, recrudescence, TIA  Patient's presentation is most consistent with acute presentation with potential threat to life or bodily function.   The patient is on the cardiac monitor to evaluate for evidence of arrhythmia and/or significant heart rate changes.  Patient presented to the emergency department today as a code stroke.  Imaging was concerning for acute infarct.  He was seen upon arrival by Dr. Iver Nestle with neurology. He was transferred emergently to [facility] for IR intervention.      FINAL CLINICAL IMPRESSION(S) /  ED DIAGNOSES   Final diagnoses:  Cerebrovascular accident (CVA), unspecified mechanism (HCC)      Note:  This document was prepared using Dragon voice recognition software and may include unintentional dictation errors.    Phineas Semen, MD 03/21/23 [phone]

## 2023-03-21 NOTE — ED Notes (Signed)
Neuro at bedside.

## 2023-03-21 NOTE — ED Triage Notes (Signed)
Pt here via [facility] with stroke like symptoms. Pt was home with family with they noticed he began dropping items from his right hand and has a facial droop. Pt denies pain.

## 2023-03-21 NOTE — ED Notes (Signed)
Code  IR CALLED  TO  Melburn Hake

## 2023-03-21 NOTE — Procedures (Signed)
INR.  Status post left common carotid arteriogram.  Right CFA approach.  Findings.  1. 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  Status post revascularization of occluded left MCA inferior division with 1 pass with a 3 mm x 20 mm Solitaire X retrieval device, 1 pass with 4 mm x 40 mm solitary X arterial device and contact aspiration and 2 passes with aspiration with sustained TICI 2C revascularization.  Post CT of brain no evidence of intracranial hemorrhage.  8 French Angio-Seal closure device deployed for hemostasis at the right groin puncture site.  Distal pulses unchanged from start of the procedure.  Patient extubated.  Pupils 5/6 mm RT =Lt ,sluggishly reactive. Moves Lt side spontaneously. Not following commands.  Meds given.  Aspirin 81 mg and brilinta 90 mg  via  OG tube in anticipation of rescue stenting.  S.Ameer Sanden MD

## 2023-03-21 NOTE — Transfer of Care (Signed)
Immediate Anesthesia Transfer of Care Note  Patient: Brian Rios  Procedure(s) Performed: RADIOLOGY WITH ANESTHESIA  Patient Location: ICU  Anesthesia Type:General  Level of Consciousness: drowsy and responds to stimulation  Airway & Oxygen Therapy: Patient Spontanous Breathing and Patient connected to face mask  Post-op Assessment: Report given to RN and Post -op Vital signs reviewed and stable  Post vital signs: Reviewed and stable  Last Vitals:  Vitals Value Taken Time  BP 119/66 03/21/23 2100  Temp    Pulse 92 03/21/23 2112  Resp 26 03/21/23 2112  SpO2 95 % 03/21/23 2112  Vitals shown include unvalidated device data.  Last Pain: There were no vitals filed for this visit.       Complications: No notable events documented.

## 2023-03-21 NOTE — Code Documentation (Addendum)
CODE STROKE- PHARMACY COMMUNICATION   Time CODE STROKE called/page received:1511  Time response to CODE STROKE was made (in person or via phone): Immediate  Time Stroke Kit retrieved from Pyxis (only if needed):N/A  Name of Provider/Nurse contacted:Dr. 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 Authorizing Provider  ARIPiprazole (ABILIFY) 30 MG tablet Take 1 tablet (30 mg total) by mouth at bedtime. 11/20/22     ARIPiprazole ER (ABILIFY MAINTENA) 400 MG PRSY prefilled syringe Inject 400 mg into the muscle every 30 (thirty) days.    [provider]  atorvastatin (LIPITOR) 40 MG tablet Take 1 tablet (40 mg total) by mouth daily. Must keep OV with Lanora Manis, NP on 10/25/22 prior to additional refills. 11/20/22   Iloabachie, Chioma E, NP  benztropine (COGENTIN) 0.5 MG tablet Take 2 tablets by mouth at beddtime 04/19/22     benztropine (COGENTIN) 1 MG tablet 1 po q hs 07/12/22     benztropine (COGENTIN) 1 MG tablet Take 1 tablet (1 mg total) by mouth at bedtime. 11/20/22     divalproex (DEPAKOTE ER) 250 MG 24 hr tablet 3 po q hs 07/12/22     divalproex (DEPAKOTE ER) 250 MG 24 hr tablet Take 3 tablets (750 mg total) by mouth at bedtime. 11/20/22     escitalopram (LEXAPRO) 20 MG tablet 1 po q day Patient taking differently: Take 20 mg by mouth at bedtime. 07/12/22     escitalopram (LEXAPRO) 20 MG tablet Take 1 tablet (20 mg total) by mouth daily. 11/20/22     hydrOXYzine (ATARAX) 25 MG tablet Take 3 tablets (75 mg total) by mouth at bedtime as needed. 09/18/22     hydrOXYzine (ATARAX) 25 MG tablet Take 3 tablets (75 mg total) by mouth at bedtime as needed. 11/20/22     loperamide (IMODIUM) 2  MG capsule Take 2 capsules (4 mg total) by mouth 3 (three) times daily as needed for diarrhea or loose stools. 01/24/23   Donovan Kail, PA-C  metFORMIN (GLUCOPHAGE-XR) 500 MG 24 hr tablet Take 1 tablet (500 mg total) by mouth daily with breakfast. 09/20/22   Iloabachie, Chioma E, NP  mirtazapine (REMERON) 45 MG tablet 1 po q hs 07/12/22     mirtazapine (REMERON) 45 MG tablet Take 1 tablet (45 mg total) by mouth at bedtime. 11/20/22     tadalafil (CIALIS) 5 MG tablet Take 2-4 tablets (10-20 mg total) by mouth once daily as needed for erectile dysfunction. 10/04/21   Sondra Come, MD  valbenazine [facility]) 80 MG capsule Take one capsule by mouth at bedtime. 09/18/22     valbenazine (INGREZZA) 80 MG capsule Take one capsule by mouth at bedtime. 11/20/22       Barrie Folk ,PharmD Clinical Pharmacist  03/21/2023  4:13 PM

## 2023-03-21 NOTE — H&P (Addendum)
Admission H&P    Chief Complaint: Right sided weakness, right facial droop and dysphasia.    HPI: Brian Rios is a 42 y.o. male with a PMHx of EtOH abuse, depression, drug abuse, left basal ganglia embolic stroke in 2021, ED, heart murmur, ;HLD, schizophrenia and tobacco abuse who initially presented to the [facility] ED today with acute onset of right sided weakness, right facial droop and dysphasia. The patient was home with family with they noticed he began dropping items from his right hand and had a facial droop. LKN was unknown. Time of symptom recognition was 1200. Exam in the [facility] ED revealed the patient to be disoriented, with right facial droop, right arm weakness, right leg weakness, expressive aphasia and dysarthria. NIHSS was 9.   CT head was worrisome for an acute infarct in the posterior left parietal lobe, with ASPECTS of 9.   CTA revealed occlusion of two proximal left M2 branches, mild left M1 stenosis and widely patent carotid arteries.  CTP was nondiagnostic.  MRI brain revealed patchy DWI-FLAIR mismatch in the posterior left frontoparietal region.   The patient was then emergently transported to [facility] for emergent thrombectomy.   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   left cerebrum   Erectile dysfunction    Heart murmur    Hyperlipidemia    Schizophrenia (HCC)    Tobacco abuse     Past Surgical History:  Procedure Laterality Date   BOWEL RESECTION N/A 09/04/2022   Procedure: SMALL BOWEL RESECTION, open;  Surgeon: Leafy Ro, MD;  Location: [facility] ORS;  Service: General;  Laterality: N/A;   ORIF ORBITAL FRACTURE  1985   PARTIAL COLECTOMY Right 09/04/2022   Procedure: PARTIAL COLECTOMY;  Surgeon: Leafy Ro, MD;  Location: [facility] ORS;  Service: General;  Laterality: Right;  Right colectimy   TEE WITHOUT CARDIOVERSION N/A 07/19/2020   Procedure:  TRANSESOPHAGEAL ECHOCARDIOGRAM (TEE);  Surgeon: Lamar Blinks, MD;  Location: [facility] ORS;  Service: Cardiovascular;  Laterality: N/A;   WRIST SURGERY Left 11/07/2013   REPAIR ULNAR ARTERY, NERVE AND TENDON    Family History  Problem Relation Age of Onset   Alcoholism Mother    Hypertension Mother    Hyperlipidemia Mother    Breast cancer Mother    Alcoholism Father    Diabetes Maternal Grandmother    Social History:  reports that he has been smoking cigarettes. He has a 15.00 pack-year smoking history. He has been exposed to tobacco smoke. He quit smokeless tobacco use about 20 years ago. He reports current alcohol use of about 6.0 standard drinks of alcohol per week. He reports current drug use. Frequency: 7.00 times per week. Drugs: "Crack" cocaine and Marijuana.  Allergies: No Known Allergies  Home Medications: No current facility-administered medications on file prior to encounter.   Current Outpatient Medications on File Prior to Encounter  Medication Sig Dispense Refill   ARIPiprazole (ABILIFY) 30 MG tablet Take 1 tablet (30 mg total) by mouth at bedtime. 30 tablet 1   ARIPiprazole ER (ABILIFY MAINTENA) 400 MG PRSY prefilled syringe Inject 400 mg into the muscle every 30 (thirty) days.     atorvastatin (LIPITOR) 40 MG tablet Take 1 tablet (40 mg total) by mouth daily. Must keep OV with Lanora Manis, NP on 10/25/22 prior to additional refills. 30 tablet 0   benztropine (COGENTIN) 0.5 MG tablet  Take 2 tablets by mouth at beddtime 60 tablet 2   benztropine (COGENTIN) 1 MG tablet 1 po q hs 30 tablet 1   benztropine (COGENTIN) 1 MG tablet Take 1 tablet (1 mg total) by mouth at bedtime. 30 tablet 1   divalproex (DEPAKOTE ER) 250 MG 24 hr tablet 3 po q hs 90 tablet 1   divalproex (DEPAKOTE ER) 250 MG 24 hr tablet Take 3 tablets (750 mg total) by mouth at bedtime. 90 tablet 1   escitalopram (LEXAPRO) 20 MG tablet 1 po q day (Patient taking differently: Take 20 mg by mouth at bedtime.) 30  tablet 1   escitalopram (LEXAPRO) 20 MG tablet Take 1 tablet (20 mg total) by mouth daily. 30 tablet 1   hydrOXYzine (ATARAX) 25 MG tablet Take 3 tablets (75 mg total) by mouth at bedtime as needed. 90 tablet 1   hydrOXYzine (ATARAX) 25 MG tablet Take 3 tablets (75 mg total) by mouth at bedtime as needed. 90 tablet 1   loperamide (IMODIUM) 2 MG capsule Take 2 capsules (4 mg total) by mouth 3 (three) times daily as needed for diarrhea or loose stools. 180 capsule 1   metFORMIN (GLUCOPHAGE-XR) 500 MG 24 hr tablet Take 1 tablet (500 mg total) by mouth daily with breakfast. 30 tablet 2   mirtazapine (REMERON) 45 MG tablet 1 po q hs 30 tablet 1   mirtazapine (REMERON) 45 MG tablet Take 1 tablet (45 mg total) by mouth at bedtime. 30 tablet 1   tadalafil (CIALIS) 5 MG tablet Take 2-4 tablets (10-20 mg total) by mouth once daily as needed for erectile dysfunction. 30 tablet 11   valbenazine (INGREZZA) 80 MG capsule Take one capsule by mouth at bedtime. 30 capsule 1   valbenazine (INGREZZA) 80 MG capsule Take one capsule by mouth at bedtime. 30 capsule 1     ROS: As per HPI. Detailed ROS deferred in the context of need to rapidly transport to IR and prepare for thrombectomy.    Physical Examination: There were no vitals taken for this visit.  Physical Exam  HEENT:  [place]/AT Lungs: Respirations unlabored Extremities: Warm and well perfused.   Neurological Examination Mental Status: Awake with mildly decreased level of alertness. Partially oriented. Speech is sparse and halting, but with no errors of grammar or syntax. Poor insight in conjunction with confusion. Not agitated. Naming intact. Able to follow simple commands, but has difficulty with comprehension of complex motor commands and questions. Subtle right sided neglect.  Cranial Nerves: II: Has difficulty following instructions for testing of visual fields.    III,IV, VI: No ptosis. EOMI. No nystagmus.  VII: Mild right facial droop VIII:  Hearing intact to questions IX,X: No hoarseness or hypophonia XI: Head is slightly preferentially rotated to the left more often than the right Motor: LUE 5/5 RUE 3/5 distally, 4/5 proximally LLE 5/5 RLE equivocal findings in the context of confusion and difficulty following instructions Sensory: Gives inconsistent responses and confuses right with left when testing for DSS Cerebellar: No ataxia with FNF bilaterally, but slower on the right Gait: Deferred  Results for orders placed or performed during the hospital encounter of 03/21/23 (from the past 48 hour(s))  CBG monitoring, ED     Status: Abnormal   Collection Time: 03/21/23  3:15 PM  Result Value Ref Range   Glucose-Capillary 123 (H) 70 - 99 mg/dL    Comment: Glucose reference range applies only to samples taken after fasting for at least 8 hours.  Ethanol     Status: None   Collection Time: 03/21/23  3:18 PM  Result Value Ref Range   Alcohol, Ethyl (B) <10 <10 mg/dL    Comment: (NOTE) Lowest detectable limit for serum alcohol is 10 mg/dL.  For medical purposes only. Performed at [facility], [address] [place], [place] [address]   Protime-INR     Status: None   Collection Time: 03/21/23  3:18 PM  Result Value Ref Range   Prothrombin Time 13.5 11.4 - 15.2 seconds   INR 1.0 0.8 - 1.2    Comment: (NOTE) INR goal varies based on device and disease states. Performed at [facility], [address] [place], [place] [address]   APTT     Status: None   Collection Time: 03/21/23  3:18 PM  Result Value Ref Range   aPTT 30 24 - 36 seconds    Comment: Performed at [facility], [address] [place], [place] [address]  CBC     Status: Abnormal   Collection Time: 03/21/23  3:18 PM  Result Value Ref Range   WBC 13.4 (H) 4.0 - 10.5 K/uL   RBC 5.22 4.22 - 5.81 MIL/uL   Hemoglobin 14.4 13.0 - 17.0 g/dL   HCT 91.4 78.2 - 95.6 %   MCV 84.7 80.0 - 100.0 fL   MCH 27.6 26.0 - 34.0 pg   MCHC  32.6 30.0 - 36.0 g/dL   RDW 21.3 08.6 - 57.8 %   Platelets 232 150 - 400 K/uL   nRBC 0.0 0.0 - 0.2 %    Comment: Performed at [facility], [address] [place], [place] [address]  Differential     Status: Abnormal   Collection Time: 03/21/23  3:18 PM  Result Value Ref Range   Neutrophils Relative % 74 %   Neutro Abs 10.0 (H) 1.7 - 7.7 K/uL   Lymphocytes Relative 16 %   Lymphs Abs 2.2 0.7 - 4.0 K/uL   Monocytes Relative 7 %   Monocytes Absolute 1.0 0.1 - 1.0 K/uL   Eosinophils Relative 1 %   Eosinophils Absolute 0.1 0.0 - 0.5 K/uL   Basophils Relative 1 %   Basophils Absolute 0.1 0.0 - 0.1 K/uL   Immature Granulocytes 1 %   Abs Immature Granulocytes 0.14 (H) 0.00 - 0.07 K/uL    Comment: Performed at [facility], [address] [place], [place] [address]  Comprehensive metabolic panel     Status: Abnormal   Collection Time: 03/21/23  3:18 PM  Result Value Ref Range   Sodium 138 135 - 145 mmol/L   Potassium 3.2 (L) 3.5 - 5.1 mmol/L   Chloride 109 98 - 111 mmol/L   CO2 24 22 - 32 mmol/L   Glucose, Bld 127 (H) 70 - 99 mg/dL    Comment: Glucose reference range applies only to samples taken after fasting for at least 8 hours.   BUN 9 6 - 20 mg/dL   Creatinine, Ser 1.32 0.61 - 1.24 mg/dL   Calcium 8.1 (L) 8.9 - 10.3 mg/dL   Total Protein 5.4 (L) 6.5 - 8.1 g/dL   Albumin 2.9 (L) 3.5 - 5.0 g/dL   AST 20 15 - 41 U/L   ALT 17 0 - 44 U/L   Alkaline Phosphatase 52 38 - 126 U/L   Total Bilirubin 0.6 0.3 - 1.2 mg/dL   GFR, Estimated >44 >01 mL/min    Comment: (NOTE) Calculated using the CKD-EPI Creatinine  Equation (2021)    Anion gap 5 5 - 15    Comment: Performed at [facility], [address] [place], [place] [address]  SARS Coronavirus 2 by RT PCR (hospital order, performed in [facility] hospital lab) *cepheid single result test* Anterior Nasal Swab     Status: None   Collection Time: 03/21/23  4:11 PM   Specimen: Anterior Nasal Swab  Result  Value Ref Range   SARS Coronavirus 2 by RT PCR NEGATIVE NEGATIVE    Comment: (NOTE) SARS-CoV-2 target nucleic acids are NOT DETECTED.  The SARS-CoV-2 RNA is generally detectable in upper and lower respiratory specimens during the acute phase of infection. The lowest concentration of SARS-CoV-2 viral copies this assay can detect is 250 copies / mL. A negative result does not preclude SARS-CoV-2 infection and should not be used as the sole basis for treatment or other patient management decisions.  A negative result may occur with improper specimen collection / handling, submission of specimen other than nasopharyngeal swab, presence of viral mutation(s) within the areas targeted by this assay, and inadequate number of viral copies (<250 copies / mL). A negative result must be combined with clinical observations, patient history, and epidemiological information.  Fact Sheet for Patients:   RoadLapTop.co.za  Fact Sheet for Healthcare Providers: http://kim-miller.com/  This test is not yet approved or  cleared by the [place] FDA and has been authorized for detection and/or diagnosis of SARS-CoV-2 by FDA under an Emergency Use Authorization (EUA).  This EUA will remain in effect (meaning this test can be used) for the duration of the COVID-19 declaration under Section 564(b)(1) of the Act, 21 U.S.C. section 360bbb-3(b)(1), unless the authorization is terminated or revoked sooner.  Performed at [facility], [address] [place], [place] [address]    MR BRAIN WO CONTRAST  Result Date: 03/21/2023 CLINICAL DATA:  Neuro deficit, acute, stroke suspected. Right-sided weakness and facial droop. EXAM: MRI HEAD WITHOUT CONTRAST TECHNIQUE: Multiplanar, multiecho pulse sequences of the brain and surrounding structures were obtained without intravenous contrast. COMPARISON:  Head CT and CTA 03/21/2023.  Head MRI 07/17/2020. FINDINGS:  The examination was discontinued at the direction of the attending neurologist prior to obtaining a sagittal T1 sequence due to the patient's condition. Brain: There are patchy acute cortical and subcortical infarcts in the left MCA territory involving the frontal and parietal lobes with corresponding mild FLAIR hyperintensity. 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 There is ex vacuo dilatation of the frontal horn of the left lateral ventricle. Small T2 hyperintensities scattered throughout the cerebral white matter elsewhere bilaterally are stable to slightly increased compared to the prior MRI and are moderately advanced for age. No mass, midline shift, or extra-axial fluid collection is evident. Vascular: Major intracranial vascular flow voids are preserved. Skull and upper cervical spine: No suspicious marrow lesion. Sinuses/Orbits: Unremarkable orbits. Mild mucosal thickening in the paranasal sinuses. Clear mastoid air cells. Other: None. IMPRESSION: 1. Patchy acute left MCA infarcts superimposed on chronic infarcts. 2. Moderately age advanced cerebral white matter T2 signal changes, nonspecific though may reflect chronic small vessel ischemia, migraines, vasculitis, sequelae of previous infection/inflammation, or demyelinating disease. Electronically Signed   By: Sebastian Ache M.D.   On: 03/21/2023 16:22   CT ANGIO HEAD NECK W WO CM W PERF (CODE STROKE)  Result Date: 03/21/2023 CLINICAL DATA:  Neuro deficit, acute, stroke suspected. Right-sided weakness and facial droop. EXAM: CT ANGIOGRAPHY HEAD AND NECK CT PERFUSION BRAIN TECHNIQUE: Multidetector CT imaging of the head and neck was performed using the standard protocol during bolus administration of intravenous contrast. Multiplanar CT image reconstructions and MIPs were  obtained to evaluate the vascular anatomy. Carotid stenosis measurements (when applicable) are obtained utilizing NASCET criteria, using the distal internal carotid diameter as the denominator. Multiphase CT imaging of the brain was performed following IV bolus contrast injection. Subsequent parametric perfusion maps were calculated using RAPID software. RADIATION DOSE REDUCTION: This exam was performed according to the departmental dose-optimization program which includes automated exposure control, adjustment of the mA and/or kV according to patient size and/or use of iterative reconstruction technique. CONTRAST:  OMNIPAQUE IOHEXOL 350 MG/ML SOLN COMPARISON:  Head and neck MRA 07/17/2020 FINDINGS: CTA NECK FINDINGS Aortic arch: Standard 3 vessel aortic arch with widely patent arch vessel origins. Right carotid system: Patent without evidence of stenosis or dissection. Tortuous distal cervical ICA. Left carotid system: Patent without evidence of stenosis or dissection. Vertebral arteries: Patent with the right being moderately dominant. Limited assessment of the left V1 segment due to venous contrast. No evidence of a significant stenosis or dissection elsewhere in the neck. Skeleton: No acute osseous abnormality or suspicious osseous lesion. Other neck: No evidence of cervical lymphadenopathy or mass. Upper chest: No mass or consolidation in the included lung apices. Review of the MIP images confirms the above findings CTA HEAD FINDINGS Anterior circulation: The internal carotid arteries are widely patent from skull base to carotid termini. The left M1 segment is patent with a mild stenosis proximally. When comparing with the 2021 MRA, there is new occlusion of 2 separate proximal left M2 branches. The right MCA and both ACAs are patent without evidence of a significant proximal stenosis. The right A1 segment is absent. No aneurysm is identified. Posterior circulation: The intracranial vertebral arteries are  patent to the basilar. Patent PICA and SCA origins are visualized bilaterally. The basilar artery is widely patent. Posterior communicating arteries are diminutive or absent. Both PCAs are patent without evidence of a significant proximal stenosis. No aneurysm is identified. Venous sinuses: Patent. Anatomic variants: Absent right A1 segment. Review of the MIP images confirms the above findings CT Brain Perfusion Findings: Failed processing due to either mistimed or inadequate bolus which could not be manually corrected. These results were communicated to Dr. Iver Nestle at 3:50 pm on 03/21/2023 by text page via the [facility] messaging system. IMPRESSION: 1. 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 2. Mild left M1 stenosis. 3. Widely patent carotid arteries. 4. Nondiagnostic CTP. Electronically Signed   By: Sebastian Ache M.D.   On: 03/21/2023 16:13   CT HEAD CODE STROKE WO CONTRAST  Result Date: 03/21/2023 CLINICAL DATA:  Code stroke.  Right-sided weakness and facial droop EXAM: CT HEAD WITHOUT CONTRAST TECHNIQUE: Contiguous axial images were obtained from the base of the skull through the vertex without intravenous contrast. RADIATION DOSE REDUCTION: This exam was performed according to the departmental dose-optimization program which includes automated exposure control, adjustment of the mA and/or kV according to patient size and/or use of iterative reconstruction technique. COMPARISON:  CT Head 07/17/20 FINDINGS: Brain: Possible region of loss of gray-white differentiation in the left parietal lobe (series 3, image 26). No hemorrhage. No extra-axial fluid collection. No extra-axial fluid collection. Vascular: No hyperdense vessel or unexpected calcification. Skull: Normal. Negative for fracture or focal  lesion. Sinuses/Orbits: No middle ear or mastoid effusion. Frothy secretions in the right maxillary sinus, which can be seen in the setting of acute sinusitis. Orbits are unremarkable. Other: None. ASPECTS [facility] Stroke  Program Early CT Score) - Ganglionic level infarction (caudate, lentiform nuclei, internal capsule, insula, M1-M3 cortex): 7 - Supraganglionic infarction (M4-M6 cortex): 2 Total score (0-10 with 10 being normal): 9 IMPRESSION: Findings are worrisome for an acute infarct in the posterior left parietal lobe. Aspects is 9. No hemorrhage. Findings were paged to Dr. Iver Nestle on 03/21/23 at 3:29 PM via [facility] paging system. Electronically Signed   By: Lorenza Cambridge M.D.   On: 03/21/2023 15:31     Assessment: 42 year old male presenting with new onset of right sided weakness, right facial droop and dysphasia secondary to occlusion of two proximal left M2 branches - Exam after arriving to the bridge at the [facility] ED reveals findings referable to the left MCA territory including receptive dysphasia, right facial droop and RUE weakness. NIHSS is 9 based on multiple deficits, some of which are relatively subtle.  - Imaging findings are as described in the HPI above - The patient was consented for VIR while still at [facility]. Consent form brought to the IR suite.  - The patient was not a TNK candidate due to unknown LKN - Candidacy for thrombectomy in the setting of unknown LKN was determined with MRI, which showed ischemic lesions with DWI-FLAIR mismatch in the left MCA territory.    Plan: 1. Admitting to Neuro ICU after VIR.  2. Post-VIR order set to include frequent neuro checks and BP management.  3. No antiplatelet medications or anticoagulants for at least 24 hours following VIR.  4. DVT prophylaxis with SCDs.  5. Continue atorvastatin.  6. Will need escalation to be started on antiplatelet therapy if follow up CT at 24 hours is negative for hemorrhagic conversion. 7. Cardiac telemetry.  8. TTE.  9. Repeat MRI brain tomorrow to assess for possible post-VIR reversal of the DWI abnormalities seen on initial MRI at [facility]  10. PT/OT/Speech.  11. NPO until passes swallow evaluation.  12. Fasting lipid panel,  HgbA1c 13. Full Code    Electronically signed: Dr. Caryl Pina 03/21/2023, 5:13 PM

## 2023-03-21 NOTE — Anesthesia Preprocedure Evaluation (Signed)
Anesthesia Evaluation  Patient identified by MRN, date of birth, ID band Patient awake    Reviewed: Allergy & Precautions, NPO status , Patient's Chart, lab work & pertinent test resultsPreop documentation limited or incomplete due to emergent nature of procedure.  Airway Mallampati: III  TM Distance: >3 FB Neck ROM: Full    Dental  (+) Dental Advisory Given   Pulmonary Current Smoker and Patient abstained from smoking.   breath sounds clear to auscultation       Cardiovascular  Rhythm:Regular Rate:Tachycardia     Neuro/Psych CVA    GI/Hepatic   Endo/Other    Renal/GU      Musculoskeletal   Abdominal   Peds  Hematology   Anesthesia Other Findings   Reproductive/Obstetrics                              Lab Results  Component Value Date   WBC 13.4 (H) 03/21/2023   HGB 14.4 03/21/2023   HCT 44.2 03/21/2023   MCV 84.7 03/21/2023   PLT 232 03/21/2023   Lab Results  Component Value Date   CREATININE 1.13 03/21/2023   BUN 9 03/21/2023   NA 138 03/21/2023   K 3.2 (L) 03/21/2023   CL 109 03/21/2023   CO2 24 03/21/2023    Anesthesia Physical Anesthesia Plan  ASA: 4 and emergent  Anesthesia Plan: General   Post-op Pain Management: Minimal or no pain anticipated   Induction: Intravenous  PONV Risk Score and Plan: 1 and Ondansetron, Dexamethasone and Treatment may vary due to age or medical condition  Airway Management Planned: Oral ETT  Additional Equipment: Arterial line  Intra-op Plan:   Post-operative Plan: Possible Post-op intubation/ventilation and Extubation in OR  Informed Consent: I have reviewed the patients History and Physical, chart, labs and discussed the procedure including the risks, benefits and alternatives for the proposed anesthesia with the patient or authorized representative who has indicated his/her understanding and acceptance.     Dental advisory  given  Plan Discussed with: CRNA  Anesthesia Plan Comments:          Anesthesia Quick Evaluation

## 2023-03-21 NOTE — Sedation Documentation (Signed)
Pt not following commands. No movement of right sided extremities. Moving left leg and left arm. Non verbal. Pupils sluggish 5-6 mm. Right knee immobilizer applied.

## 2023-03-21 NOTE — Consult Note (Signed)
Neurology Consultation Reason for Consult: Code stroke Requesting Physician: Kandis Mannan  CC: Speech difficulty and right sided weakness  History is obtained from: EMS and chart review,   HPI: Brian Rios is a 42 y.o. 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  Eventually was able to speak with an aunt by which time we had confirmed left MCA occlusion and I focused my discussion on risks and benefits of thrombectomy for which she gave consent.  LKW: Unclear, noon per EMS but unable to reach family who was with the patient at the time Thrombolytic given?: No, significant FLAIR change in many areas of diffusion change   Delay in decision due to aphasia patient, family difficult to reach  IA performed?: Yes, transferred to [facility]  Premorbid modified rankin scale: 1 based on last neurological exam at discharge (2021)  ROS: Unable to obtain due to aphasia  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   left cerebrum   Erectile dysfunction    Heart murmur    Hyperlipidemia    Schizophrenia (HCC)    Tobacco abuse    Past Surgical History:  Procedure Laterality Date   BOWEL RESECTION N/A 09/04/2022   Procedure: SMALL BOWEL RESECTION, open;  Surgeon: Leafy Ro, MD;  Location: [facility] ORS;  Service: General;  Laterality: N/A;    ORIF ORBITAL FRACTURE  1985   PARTIAL COLECTOMY Right 09/04/2022   Procedure: PARTIAL COLECTOMY;  Surgeon: Leafy Ro, MD;  Location: [facility] ORS;  Service: General;  Laterality: Right;  Right colectimy   TEE WITHOUT CARDIOVERSION N/A 07/19/2020   Procedure: TRANSESOPHAGEAL ECHOCARDIOGRAM (TEE);  Surgeon: Lamar Blinks, MD;  Location: [facility] ORS;  Service: Cardiovascular;  Laterality: N/A;   WRIST SURGERY Left 11/07/2013   REPAIR ULNAR ARTERY, NERVE AND TENDON   Current Outpatient Medications  Medication Instructions   ARIPiprazole (ABILIFY) 30 mg, Oral, Nightly   ARIPiprazole ER (ABILIFY MAINTENA) 400 mg, Intramuscular, Every 30 days   atorvastatin (LIPITOR) 40 MG tablet Take 1 tablet (40 mg total) by mouth daily. Must keep OV with Lanora Manis, NP on 10/25/22 prior to additional refills.   benztropine (COGENTIN) 0.5 MG tablet Take 2 tablets by mouth at beddtime   benztropine (COGENTIN) 1 MG tablet 1 po q hs   benztropine (COGENTIN) 1 mg, Oral, Nightly   divalproex (DEPAKOTE ER) 250 MG 24 hr tablet 3 po q hs   divalproex (DEPAKOTE ER) 750 mg, Oral, Nightly   escitalopram (LEXAPRO) 20 MG tablet 1 po q day   escitalopram (LEXAPRO) 20 mg, Oral, Daily   hydrOXYzine (ATARAX) 75 mg, Oral, At bedtime PRN   hydrOXYzine (ATARAX) 75 mg, Oral, At bedtime PRN   loperamide (IMODIUM) 4 mg, Oral, 3 times daily PRN   metFORMIN (GLUCOPHAGE-XR) 500 mg, Oral, Daily with breakfast   mirtazapine (REMERON) 45 MG tablet 1  po q hs   mirtazapine (REMERON) 45 mg, Oral, Nightly   tadalafil (CIALIS) 5 MG tablet Take 2-4 tablets (10-20 mg total) by mouth once daily as needed for erectile dysfunction.   valbenazine (INGREZZA) 80 MG capsule Take one capsule by mouth at bedtime.   valbenazine (INGREZZA) 80 MG capsule Take one capsule by mouth at bedtime.     Family History  Problem Relation Age of Onset   Alcoholism Mother    Hypertension Mother    Hyperlipidemia Mother    Breast cancer Mother    Alcoholism  Father    Diabetes Maternal Grandmother     Social History:  reports that he has been smoking cigarettes. He has a 15.00 pack-year smoking history. He has been exposed to tobacco smoke. He quit smokeless tobacco use about 20 years ago. He reports current alcohol use of about 6.0 standard drinks of alcohol per week. He reports current drug use. Frequency: 7.00 times per week. Drugs: "Crack" cocaine and Marijuana.  Exam: Current vital signs: BP 129/83   Pulse 92   Temp 98.9 F (37.2 C) (Oral)   Resp 18   Ht 5\' 10"  (1.778 m)   Wt 99.3 kg   SpO2 95%   BMI 31.41 kg/m  Vital signs in last 24 hours: Temp:  [98.9 F (37.2 C)] 98.9 F (37.2 C) (05/30 1525) Pulse Rate:  [92] 92 (05/30 1525) Resp:  [18] 18 (05/30 1525) BP: (129)/(83) 129/83 (05/30 1525) SpO2:  [95 %] 95 % (05/30 1525) Weight:  [99.3 kg] 99.3 kg (05/30 1525)   Physical Exam  Constitutional: Appears well-developed and well-nourished.  Psych: Affect appropriate to situation, tearful Eyes: No scleral injection HENT: No oropharyngeal obstruction.  MSK: no joint deformities.  Cardiovascular: Normal rate and regular rhythm.  Respiratory: Effort normal, non-labored breathing, mild sinus congestion  GI: Soft.  No distension. There is no tenderness.  Skin: Warm dry and intact visible skin  Neuro: Mental Status: Patient is awake, alert, but able to give very limited history with fluctuating speech ability. Unable to state age ("um") or month ("June"). Speech hesitancy, word finding difficulty, paraphasic errors. Able to follow commands (productive > receptive aphasia) Cranial Nerves: II: Visual Fields are full.  III,IV, VI: EOMI without ptosis or diploplia.  V: Facial sensation is symmetric to light touch  VII: Facial movement notable for right facial droop  VIII: hearing is intact to voice XII: tongue is midline without atrophy or fasciculations.  Motor: Drift of the RUE, no drift LUE. RLE quickly hits the bed. No drift  LLE Sensory: Sensation is symmetric to light touch and temperature in the arms and legs. Cerebellar: FNF and HKS are intact bilaterally within limits of weakness  Gait:  Deferred   NIHSS total 9 Score breakdown: 2 for orientation, 2 for right facial droop, 1 for RUE, 2 for RLE, 1 for aphasia, 1 for dysarthria  Performed at time of patient arrival to ED    I have reviewed labs in epic and the results pertinent to this consultation are:  Basic Metabolic Panel: Recent Labs  Lab 03/21/23 1518  NA 138  K 3.2*  CL 109  CO2 24  GLUCOSE 127*  BUN 9  CREATININE 1.13  CALCIUM 8.1*    CBC: Recent Labs  Lab 03/21/23 1518  WBC 13.4*  NEUTROABS 10.0*  HGB 14.4  HCT 44.2  MCV 84.7  PLT 232    Coagulation Studies: Recent Labs    03/21/23 1518  LABPROT 13.5  INR 1.0      I have reviewed the images obtained:  Head CT personally reviewed, agree with radiology:   Findings are worrisome for an acute infarct in the posterior left parietal lobe. Aspects is 9. No hemorrhage.  CTA personally reviewed, agree with radiology:   1. 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 2. Mild left M1 stenosis. 3. Widely patent carotid arteries. 4. Nondiagnostic CTP.  MRI brain personally reviewed, agree with radiology:   1. Patchy acute left MCA infarcts superimposed on chronic infarcts. 2. Moderately age advanced cerebral white matter T2 signal changes, nonspecific though may reflect chronic small vessel ischemia, migraines, vasculitis, sequelae of previous infection/inflammation, or demyelinating disease.  Impression: 42 year old presenting with recurrent left MCA territory embolic strokes of cryptogenic etiology at this time.  Notably his blood pressure is low in the setting of acute stroke with raising concern for cardioembolic process.  Will need further workup after emergent procedure  Emergent code stroke recommendations / decision making - CTA/CTP obtained prior to TNK decision  as we were still trying to reach family to ascertain last known well more accurately - Given inability to reach family proceeded to MRI which did reveal significant FLAIR changes matching much of the diffusion restriction  Recommendations: - No TNK due to FLAIR change with much of the diffusion change - Emergent thrombectomy - Permissive hypertension - 500 cc NS due to relatively SBP 140s  - Full stroke workup on admission to [facility], appreciate accepting MD Dr. Merlene Morse MD-PhD [facility] [phone] [facility] coverage for [facility] is from 8 AM to 4 AM in-house and 4 PM to 8 PM by telephone/video. 8 PM to 8 AM emergent questions or overnight urgent questions should be addressed to Teleneurology On-call or [facility] neurohospitalist; contact information can be found on AMION  CRITICAL CARE Performed by: Gordy Councilman   Total critical care time: 70 minutes  Critical care time was exclusive of separately billable procedures and treating other patients.  Critical care was necessary to treat or prevent imminent or life-threatening deterioration.  Critical care was time spent personally by me on the following activities: development of treatment plan with patient and/or surrogate as well as nursing, discussions with consultants, evaluation of patient's response to treatment, examination of patient, obtaining history from patient or surrogate, ordering and performing treatments and interventions, ordering and review of laboratory studies, ordering and review of radiographic studies, pulse oximetry and re-evaluation of patient's condition.

## 2023-03-21 NOTE — ED Notes (Signed)
EMTALA reviewed by this RN, pt ready for transport by [facility].

## 2023-03-21 NOTE — Anesthesia Procedure Notes (Signed)
Procedure Name: Intubation Date/Time: 03/21/2023 5:03 PM  Performed by: Cy Blamer, CRNAPre-anesthesia Checklist: Patient identified, Emergency Drugs available, Suction available and Patient being monitored Patient Re-evaluated:Patient Re-evaluated prior to induction Oxygen Delivery Method: Circle system utilized Preoxygenation: Pre-oxygenation with 100% oxygen Induction Type: IV induction, Cricoid Pressure applied and Rapid sequence Laryngoscope Size: Glidescope and 3 Grade View: Grade I Tube type: Oral Tube size: 7.5 mm Number of attempts: 2 Airway Equipment and Method: Stylet and Video-laryngoscopy Placement Confirmation: ETT inserted through vocal cords under direct vision, positive ETCO2 and breath sounds checked- equal and bilateral Secured at: 24 cm Tube secured with: Tape Dental Injury: Teeth and Oropharynx as per pre-operative assessment  Comments: 1st attempt Miller 3 blade, grade 2b view unable to advance ETT

## 2023-03-21 NOTE — Code Documentation (Signed)
Stroke Response Nurse Documentation Code Documentation  Brian Rios is a 42 y.o. male arriving to [facility] via [facility] on 03/21/2023 with past medical hx of ETOH use, tobabbo use, heart murmur, prev left sided stroke, HLD. On No antithrombotic. Code stroke was activated by EMS.   Patient from home where he was LKW at 1200 and now complaining of worsening facial droop and aphasia. Per EMS, patient was LKW at 1200. Pt's family member noticed facial droop at 1200. Attempting to reach family.   Stroke team at the bedside on patient arrival. Labs drawn and patient cleared for CT by Dr. Lenard Lance. Patient to CT with team. NIHSS 9, see documentation for details and code stroke times. Patient with disoriented, right facial droop, right arm weakness, right leg weakness, Expressive aphasia , and dysarthria  on exam. The following imaging was completed:  CT Head, CTA, and MRI. Patient is not a candidate for IV Thrombolytic due to unknown LKW, per MD. Patient is a candidate for IR due to LVO on imaging, per MD.   Care Plan: Prepare for transfer to [facility] for IR. Items laced in bag for transfer: cell phone, phone charger, one bank card, one blue lighter, shoes, black pants   Bedside handoff with ED RN Brian Rios.    Brian Rios  Stroke Response RN

## 2023-03-21 NOTE — Anesthesia Procedure Notes (Signed)
Arterial Line Insertion Start/End5/30/2024 5:15 PM, 03/21/2023 5:30 PM Performed by: Lelon Perla, CRNA, CRNA  Patient location: Pre-op. Preanesthetic checklist: patient identified, IV checked, site marked, risks and benefits discussed, surgical consent, monitors and equipment checked, pre-op evaluation, timeout performed and anesthesia consent Lidocaine 1% used for infiltration Left, radial was placed Catheter size: 20 G Hand hygiene performed  and maximum sterile barriers used   Attempts: 1 Procedure performed without using ultrasound guided technique. Following insertion, dressing applied and Biopatch. Post procedure assessment: normal and unchanged  Patient tolerated the procedure well with no immediate complications.

## 2023-03-21 NOTE — Progress Notes (Signed)
This Chap responded to Code Stroke and visited the Pt at bedside. No family yet at the moment. Pt current being evaluated by the MD. Mirna Mires is available via page.   03/21/23 1700  Spiritual Encounters  Type of Visit Follow up  Care provided to: Patient  Referral source Code page  Reason for visit Code  OnCall Visit Yes  Spiritual Framework  Presenting Themes Significant life change  Interventions  Spiritual Care Interventions Made Mindfulness intervention  Intervention Outcomes  Outcomes Connection to spiritual care  Spiritual Care Plan  Spiritual Care Issues Still Outstanding No further spiritual care needs at this time (see row info)

## 2023-03-21 NOTE — Anesthesia Postprocedure Evaluation (Signed)
Anesthesia Post Note  Patient: Brian Rios  Procedure(s) Performed: RADIOLOGY WITH ANESTHESIA     Patient location during evaluation: PACU Anesthesia Type: General Level of consciousness: awake and alert Pain management: pain level controlled Vital Signs Assessment: post-procedure vital signs reviewed and stable Respiratory status: spontaneous breathing, nonlabored ventilation, respiratory function stable and patient connected to nasal cannula oxygen Cardiovascular status: blood pressure returned to baseline and stable Postop Assessment: no apparent nausea or vomiting Anesthetic complications: no   No notable events documented.  Last Vitals:  Vitals:   03/21/23 2140 03/21/23 2203  BP:    Pulse: 93   Resp: (!) 23   Temp:    SpO2: 95% 99%    Last Pain: There were no vitals filed for this visit.               Earl Lites P Teresia Myint

## 2023-03-21 NOTE — Progress Notes (Signed)
Chap responded to code stroke at Ed. Pt has been taken to CT Scan. No family present. Kindly page [place] as need arise.   03/21/23 1525  Spiritual Encounters  Type of Visit Initial  Care provided to: Pt not available  Conversation partners present during encounter Nurse  Referral source Code page  Reason for visit Code  Advance Directives (For Healthcare)  Does Patient Have a Medical Advance Directive? No  Would patient like information on creating a medical advance directive? No - Patient declined  Mental Health Advance Directives  Does Patient Have a Mental Health Advance Directive? No  Would patient like information on creating a mental health advance directive? No - Guardian declined

## 2023-03-22 ENCOUNTER — Inpatient Hospital Stay (HOSPITAL_COMMUNITY): Payer: Medicaid Other

## 2023-03-22 ENCOUNTER — Encounter (HOSPITAL_COMMUNITY): Payer: Self-pay | Admitting: Radiology

## 2023-03-22 DIAGNOSIS — I63512 Cerebral infarction due to unspecified occlusion or stenosis of left middle cerebral artery: Secondary | ICD-10-CM

## 2023-03-22 DIAGNOSIS — I6389 Other cerebral infarction: Secondary | ICD-10-CM

## 2023-03-22 LAB — ECHOCARDIOGRAM COMPLETE
AR max vel: 2.53 cm2
AV Area VTI: 2.33 cm2
AV Area mean vel: 2.39 cm2
AV Mean grad: 6 mmHg
AV Peak grad: 12.1 mmHg
Ao pk vel: 1.74 m/s
Area-P 1/2: 4.19 cm2
Height: 70 in
S' Lateral: 2.9 cm
Weight: 3502.67 oz

## 2023-03-22 LAB — BASIC METABOLIC PANEL
Anion gap: 8 (ref 5–15)
BUN: <5 mg/dL — ABNORMAL LOW (ref 6–20)
CO2: 22 mmol/L (ref 22–32)
Calcium: 7.5 mg/dL — ABNORMAL LOW (ref 8.9–10.3)
Chloride: 107 mmol/L (ref 98–111)
Creatinine, Ser: 0.97 mg/dL (ref 0.61–1.24)
GFR, Estimated: >60 mL/min (ref >60)
Glucose, Bld: 106 mg/dL — ABNORMAL HIGH (ref 70–99)
Potassium: 3.3 mmol/L — ABNORMAL LOW (ref 3.5–5.1)
Sodium: 137 mmol/L (ref 135–145)

## 2023-03-22 LAB — GLUCOSE, CAPILLARY
Glucose-Capillary: 101 mg/dL — ABNORMAL HIGH (ref 70–99)
Glucose-Capillary: 106 mg/dL — ABNORMAL HIGH (ref 70–99)
Glucose-Capillary: 111 mg/dL — ABNORMAL HIGH (ref 70–99)
Glucose-Capillary: 120 mg/dL — ABNORMAL HIGH (ref 70–99)
Glucose-Capillary: 85 mg/dL (ref 70–99)
Glucose-Capillary: 89 mg/dL (ref 70–99)

## 2023-03-22 LAB — MRSA NEXT GEN BY PCR, NASAL: MRSA by PCR Next Gen: NOT DETECTED

## 2023-03-22 LAB — CBC
HCT: 37.8 % — ABNORMAL LOW (ref 39.0–52.0)
Hemoglobin: 12.5 g/dL — ABNORMAL LOW (ref 13.0–17.0)
MCH: 27.5 pg (ref 26.0–34.0)
MCHC: 33.1 g/dL (ref 30.0–36.0)
MCV: 83.1 fL (ref 80.0–100.0)
Platelets: 207 10*3/uL (ref 150–400)
RBC: 4.55 MIL/uL (ref 4.22–5.81)
RDW: 14.6 % (ref 11.5–15.5)
WBC: 18.4 10*3/uL — ABNORMAL HIGH (ref 4.0–10.5)
nRBC: 0 % (ref 0.0–0.2)

## 2023-03-22 LAB — RAPID URINE DRUG SCREEN, HOSP PERFORMED
Amphetamines: NOT DETECTED
Barbiturates: NOT DETECTED
Benzodiazepines: NOT DETECTED
Cocaine: POSITIVE — AB
Opiates: NOT DETECTED
Tetrahydrocannabinol: POSITIVE — AB

## 2023-03-22 LAB — LIPID PANEL
Cholesterol: 113 mg/dL (ref 0–200)
HDL: 37 mg/dL — ABNORMAL LOW (ref >40)
LDL Cholesterol: 54 mg/dL (ref 0–99)
Total CHOL/HDL Ratio: 3.1 RATIO
Triglycerides: 109 mg/dL (ref <150)
VLDL: 22 mg/dL (ref 0–40)

## 2023-03-22 MED ORDER — CHLORHEXIDINE GLUCONATE CLOTH 2 % EX PADS
6.0000 | MEDICATED_PAD | Freq: Every day | CUTANEOUS | Status: DC
  Administered 2023-03-21 – 2023-03-25 (×5): 6 via TOPICAL

## 2023-03-22 MED ORDER — ORAL CARE MOUTH RINSE: 15.0000 mL | OROMUCOSAL | Status: DC | PRN

## 2023-03-22 MED ORDER — POTASSIUM CHLORIDE 10 MEQ/100ML IV SOLN
10.0000 meq | INTRAVENOUS | Status: AC
  Administered 2023-03-22 (×6): 10 meq via INTRAVENOUS
  Filled 2023-03-22 (×6): qty 100

## 2023-03-22 MED ORDER — ENOXAPARIN SODIUM 40 MG/0.4ML IJ SOSY
40.0000 mg | PREFILLED_SYRINGE | INTRAMUSCULAR | Status: DC
  Administered 2023-03-22 – 2023-03-26 (×5): 40 mg via SUBCUTANEOUS
  Filled 2023-03-22 (×5): qty 0.4

## 2023-03-22 MED ORDER — ORAL CARE MOUTH RINSE
15.0000 mL | OROMUCOSAL | Status: DC
  Administered 2023-03-22 – 2023-03-23 (×6): 15 mL via OROMUCOSAL

## 2023-03-22 MED ORDER — DEXTROSE 50 % IV SOLN
INTRAVENOUS | Status: AC
  Filled 2023-03-22: qty 50

## 2023-03-22 NOTE — Evaluation (Signed)
Clinical/Bedside Swallow Evaluation Patient Details  Name: Brian Rios MRN: [id number] Date of Birth: December 12, 1980  Today's Date: 03/22/2023 Time: SLP Start Time (ACUTE ONLY): 1340 SLP Stop Time (ACUTE ONLY): 1410 SLP Time Calculation (min) (ACUTE ONLY): 30 min  Past Medical History:  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   left cerebrum   Erectile dysfunction    Heart murmur    Hyperlipidemia    Schizophrenia (HCC)    Tobacco abuse    Past Surgical History:  Past Surgical History:  Procedure Laterality Date   BOWEL RESECTION N/A 09/04/2022   Procedure: SMALL BOWEL RESECTION, open;  Surgeon: Leafy Ro, MD;  Location: [facility] ORS;  Service: General;  Laterality: N/A;   ORIF ORBITAL FRACTURE  1985   PARTIAL COLECTOMY Right 09/04/2022   Procedure: PARTIAL COLECTOMY;  Surgeon: Leafy Ro, MD;  Location: [facility] ORS;  Service: General;  Laterality: Right;  Right colectimy   RADIOLOGY WITH ANESTHESIA N/A 03/21/2023   Procedure: RADIOLOGY WITH ANESTHESIA;  Surgeon: Radiologist, Medication, MD;  Location: [facility] OR;  Service: Radiology;  Laterality: N/A;   TEE WITHOUT CARDIOVERSION N/A 07/19/2020   Procedure: TRANSESOPHAGEAL ECHOCARDIOGRAM (TEE);  Surgeon: Lamar Blinks, MD;  Location: [facility] ORS;  Service: Cardiovascular;  Laterality: N/A;   WRIST SURGERY Left 11/07/2013   REPAIR ULNAR ARTERY, NERVE AND TENDON   HPI:  42 year old male presenting with new onset of right sided weakness, right facial droop and dysphasia secondary to occlusion of two proximal left M2 branches. Patchy acute left MCA infarcts superimposed on chronic infarcts.    Assessment / Plan / Recommendation  Clinical Impression  Pt demonstrates oral dysphagia with right motor and sensory impairment leading to anterior spillage and oral residue. Pt struggled to complete lingual ROM exam, but suspect some  lingual weakness as well on the right. Pt technically passed the yale, but well after intake he had repeated dysphonic throat clearing. Cough also sounds dysphonic. Question potential silent aspiration or decreased glottic competence. Pt also with severe right sided pocketing and spillage with solids.  Pt would benefit from a modified diet for full, nectar thick liquids until instrumental testing can be completed if still needed when possible. Pt benefit from straw sips due to anterior spillage. Seems to want to feed himself, but needs assist. Will f/u.   SLP Visit Diagnosis: Dysphagia, oropharyngeal phase (R13.12)    Aspiration Risk  Moderate aspiration risk    Diet Recommendation Nectar-thick liquid   Liquid Administration via: Spoon Medication Administration: Crushed with puree Supervision: Staff to assist with self feeding Compensations: Slow rate;Small sips/bites Postural Changes: Seated upright at 90 degrees    Other  Recommendations Oral Care Recommendations: Oral care BID    Recommendations for follow up therapy are one component of a multi-disciplinary discharge planning process, led by the attending physician.  Recommendations may be updated based on patient status, additional functional criteria and insurance authorization.  Follow up Recommendations Acute inpatient rehab (3hours/day)      Assistance Recommended at Discharge    Functional Status Assessment    Frequency and Duration min 2x/week  2 weeks       Prognosis        Swallow Study   General HPI: 42 year old male presenting with new onset of right sided weakness, right facial droop and dysphasia secondary to occlusion of  two proximal left M2 branches. Patchy acute left MCA infarcts superimposed on chronic infarcts. Type of Study: Bedside Swallow Evaluation Previous Swallow Assessment: none Respiratory Status: Room air Behavior/Cognition: Alert;Cooperative Oral Cavity Assessment: Within Functional Limits Oral  Care Completed by SLP: Yes Oral Cavity - Dentition: Adequate natural dentition Vision: Functional for self-feeding Self-Feeding Abilities: Needs assist Patient Positioning: Upright in bed Baseline Vocal Quality: Hoarse Volitional Cough:  (dysphonic) Volitional Swallow: Able to elicit    Oral/Motor/Sensory Function Overall Oral Motor/Sensory Function: Moderate impairment Facial ROM: Reduced right;Suspected CN VII (facial) dysfunction Facial Symmetry: Abnormal symmetry right;Suspected CN VII (facial) dysfunction Facial Strength: Reduced right;Suspected CN VII (facial) dysfunction Facial Sensation: Reduced right;Suspected CN V (Trigeminal) dysfunction Lingual ROM: Reduced right;Suspected CN XII (hypoglossal) dysfunction Lingual Symmetry: Abnormal symmetry right;Suspected CN XII (hypoglossal) dysfunction   Ice Chips Ice chips: Not tested   Thin Liquid Thin Liquid: Impaired Presentation: Straw;Cup Oral Phase Impairments: Reduced labial seal Oral Phase Functional Implications: Right anterior spillage Pharyngeal  Phase Impairments: Throat Clearing - Delayed    Nectar Thick Nectar Thick Liquid: Within functional limits Presentation: Straw   Honey Thick Honey Thick Liquid: Not tested   Puree Puree: Impaired Presentation: Spoon Oral Phase Impairments: Reduced labial seal Oral Phase Functional Implications: Oral residue;Right anterior spillage   Solid     Solid: Impaired Presentation: Self Fed Oral Phase Functional Implications: Impaired mastication;Right anterior spillage;Right lateral sulci pocketing      Srija Southard, Riley Nearing 03/22/2023,2:44 PM

## 2023-03-22 NOTE — Progress Notes (Addendum)
STROKE TEAM PROGRESS NOTE   INTERVAL HISTORY Patient is seen in his room with no family at the bedside.  Yesterday, he came to the hospital with acute onset right-sided weakness, right facial droop and dysarthria.  CTA revealed occlusion of 2 proximal left M2 branches, and patient was brought here for mechanical thrombectomy.  TICI 2C revascularization was achieved.  This morning, patient has persistent transcortical aphasia as well as right-sided weakness.  Repeat MRI reveals large left MCA stroke.  Vitals:   03/22/23 1000 03/22/23 1100 03/22/23 1112 03/22/23 1300  BP: 111/72 129/72  128/80  Pulse: 77 92  77  Resp: 16 17  15   Temp:   99.6 F (37.6 C)   TempSrc:   Axillary   SpO2: 97% 98%  96%   CBC:  Recent Labs  Lab 03/21/23 1518 03/21/23 2047 03/21/23 2134 03/22/23 0443  WBC 13.4*  --   --  18.4*  NEUTROABS 10.0*  --   --   --   HGB 14.4   < > 16.0 12.5*  HCT 44.2   < > 47.0 37.8*  MCV 84.7  --   --  83.1  PLT 232  --   --  207   < > = values in this interval not displayed.   Basic Metabolic Panel:  Recent Labs  Lab 03/21/23 1518 03/21/23 2047 03/21/23 2134 03/22/23 0443  NA 138   < > 138 137  K 3.2*   < > 3.5 3.3*  CL 109  --   --  107  CO2 24  --   --  22  GLUCOSE 127*  --   --  106*  BUN 9  --   --  <5*  CREATININE 1.13  --   --  0.97  CALCIUM 8.1*  --   --  7.5*   < > = values in this interval not displayed.   Lipid Panel:  Recent Labs  Lab 03/22/23 0443  CHOL 113  TRIG 109  HDL 37*  CHOLHDL 3.1  VLDL 22  LDLCALC 54   HgbA1c: No results for input(s): "HGBA1C" in the last 168 hours. Urine Drug Screen:  Recent Labs  Lab 03/22/23 0107  LABOPIA NONE DETECTED  COCAINSCRNUR POSITIVE*  LABBENZ NONE DETECTED  AMPHETMU NONE DETECTED  THCU POSITIVE*  LABBARB NONE DETECTED    Alcohol Level  Recent Labs  Lab 03/21/23 1518  ETH <10    IMAGING past 24 hours MR BRAIN WO CONTRAST  Result Date: 03/21/2023 CLINICAL DATA:  Neuro deficit, acute,  stroke suspected. Right-sided weakness and facial droop. EXAM: MRI HEAD WITHOUT CONTRAST TECHNIQUE: Multiplanar, multiecho pulse sequences of the brain and surrounding structures were obtained without intravenous contrast. COMPARISON:  Head CT and CTA 03/21/2023.  Head MRI 07/17/2020. FINDINGS: The examination was discontinued at the direction of the attending neurologist prior to obtaining a sagittal T1 sequence due to the patient's condition. Brain: There are patchy acute cortical and subcortical infarcts in the left MCA territory involving the frontal and parietal lobes with corresponding mild FLAIR hyperintensity. 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 There is ex vacuo dilatation of the frontal horn of the left lateral ventricle. Small T2 hyperintensities scattered throughout the cerebral white matter elsewhere bilaterally are stable to slightly increased compared to the prior MRI and are moderately advanced  for age. No mass, midline shift, or extra-axial fluid collection is evident. Vascular: Major intracranial vascular flow voids are preserved. Skull and upper cervical spine: No suspicious marrow lesion. Sinuses/Orbits: Unremarkable orbits. Mild mucosal thickening in the paranasal sinuses. Clear mastoid air cells. Other: None. IMPRESSION: 1. Patchy acute left MCA infarcts superimposed on chronic infarcts. 2. Moderately age advanced cerebral white matter T2 signal changes, nonspecific though may reflect chronic small vessel ischemia, migraines, vasculitis, sequelae of previous infection/inflammation, or demyelinating disease. Electronically Signed   By: Sebastian Ache M.D.   On: 03/21/2023 16:22   CT ANGIO HEAD NECK W WO CM W PERF (CODE STROKE)  Result Date: 03/21/2023 CLINICAL DATA:  Neuro deficit, acute, stroke suspected.  Right-sided weakness and facial droop. EXAM: CT ANGIOGRAPHY HEAD AND NECK CT PERFUSION BRAIN TECHNIQUE: Multidetector CT imaging of the head and neck was performed using the standard protocol during bolus administration of intravenous contrast. Multiplanar CT image reconstructions and MIPs were obtained to evaluate the vascular anatomy. Carotid stenosis measurements (when applicable) are obtained utilizing NASCET criteria, using the distal internal carotid diameter as the denominator. Multiphase CT imaging of the brain was performed following IV bolus contrast injection. Subsequent parametric perfusion maps were calculated using RAPID software. RADIATION DOSE REDUCTION: This exam was performed according to the departmental dose-optimization program which includes automated exposure control, adjustment of the mA and/or kV according to patient size and/or use of iterative reconstruction technique. CONTRAST:  OMNIPAQUE IOHEXOL 350 MG/ML SOLN COMPARISON:  Head and neck MRA 07/17/2020 FINDINGS: CTA NECK FINDINGS Aortic arch: Standard 3 vessel aortic arch with widely patent arch vessel origins. Right carotid system: Patent without evidence of stenosis or dissection. Tortuous distal cervical ICA. Left carotid system: Patent without evidence of stenosis or dissection. Vertebral arteries: Patent with the right being moderately dominant. Limited assessment of the left V1 segment due to venous contrast. No evidence of a significant stenosis or dissection elsewhere in the neck. Skeleton: No acute osseous abnormality or suspicious osseous lesion. Other neck: No evidence of cervical lymphadenopathy or mass. Upper chest: No mass or consolidation in the included lung apices. Review of the MIP images confirms the above findings CTA HEAD FINDINGS Anterior circulation: The internal carotid arteries are widely patent from skull base to carotid termini. The left M1 segment is patent with a mild stenosis proximally. When comparing  with the 2021 MRA, there is new occlusion of 2 separate proximal left M2 branches. The right MCA and both ACAs are patent without evidence of a significant proximal stenosis. The right A1 segment is absent. No aneurysm is identified. Posterior circulation: The intracranial vertebral arteries are patent to the basilar. Patent PICA and SCA origins are visualized bilaterally. The basilar artery is widely patent. Posterior communicating arteries are diminutive or absent. Both PCAs are patent without evidence of a significant proximal stenosis. No aneurysm is identified. Venous sinuses: Patent. Anatomic variants: Absent right A1 segment. Review of the MIP images confirms the above findings CT Brain Perfusion Findings: Failed processing due to either mistimed or inadequate bolus which could not be manually corrected. These results were communicated to Dr. Iver Nestle at 3:50 pm on 03/21/2023 by text page via the [facility] messaging system. IMPRESSION: 1. 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 2. Mild left M1 stenosis. 3. Widely patent carotid arteries. 4. Nondiagnostic CTP. Electronically Signed   By: Sebastian Ache M.D.   On: 03/21/2023 16:13   CT HEAD CODE STROKE WO CONTRAST  Result Date: 03/21/2023 CLINICAL DATA:  Code  stroke.  Right-sided weakness and facial droop EXAM: CT HEAD WITHOUT CONTRAST TECHNIQUE: Contiguous axial images were obtained from the base of the skull through the vertex without intravenous contrast. RADIATION DOSE REDUCTION: This exam was performed according to the departmental dose-optimization program which includes automated exposure control, adjustment of the mA and/or kV according to patient size and/or use of iterative reconstruction technique. COMPARISON:  CT Head 07/17/20 FINDINGS: Brain: Possible region of loss of gray-white differentiation in the left parietal lobe (series 3, image 26). No hemorrhage. No extra-axial fluid collection. No extra-axial fluid collection. Vascular: No hyperdense vessel  or unexpected calcification. Skull: Normal. Negative for fracture or focal lesion. Sinuses/Orbits: No middle ear or mastoid effusion. Frothy secretions in the right maxillary sinus, which can be seen in the setting of acute sinusitis. Orbits are unremarkable. Other: None. ASPECTS [facility] Stroke Program Early CT Score) - Ganglionic level infarction (caudate, lentiform nuclei, internal capsule, insula, M1-M3 cortex): 7 - Supraganglionic infarction (M4-M6 cortex): 2 Total score (0-10 with 10 being normal): 9 IMPRESSION: Findings are worrisome for an acute infarct in the posterior left parietal lobe. Aspects is 9. No hemorrhage. Findings were paged to Dr. Iver Nestle on 03/21/23 at 3:29 PM via [facility] paging system. Electronically Signed   By: Lorenza Cambridge M.D.   On: 03/21/2023 15:31    PHYSICAL EXAM General: Alert, well-nourished, well-developed patient in no acute distress Respiratory: Regular, unlabored respirations Neurological: Pupils equal round and reactive, but gaze disconjugate with right eye unable to cross midline to the left and left eye unable to cross midline to the right.  He is able to intermittently follow commands.  Patient has global transcortical aphasia but is able to repeat simple sentences.  Right facial droop noted, 5 out of 5 strength in left upper extremity and left lower extremity, 3 out of 5 strength in right upper extremity and right lower extremity  ASSESSMENT/PLAN Mr. Josede Warshauer is a 42 y.o. male with history of depression, EtOH abuse, polysubstance abuse, embolic stroke in 2021, hyperlipidemia, schizophrenia and tobacco abuse presenting with acute onset right-sided weakness, right facial droop and dysarthria.  CTA revealed occlusion of 2 proximal left M2 branches, and patient was brought here for mechanical thrombectomy.  TICI 2C revascularization was achieved.  This morning, patient has persistent transcortical aphasia as well as right-sided weakness.  Repeat MRI reveals large  left MCA stroke.  Stroke:  left MCA territory stroke due to left M2 occlusion s/p IR with TICI2c but reoccluded, likely large vessel disease from uncontrolled risk factors and cocaine use Code Stroke CT head likely acute infarct in posterior left parietal lobe ASPECTS 9 CTA head & neck occlusion of 2 left proximal M2 branches, left M1 stenosis MRI 5/30: Patchy acute left MCA infarct superimposed on chronic infarct S/p IR with left M2 TICI2c reperfusion MRI 5/31 interval increase in size of the acute infarct involving the majority of the left and insular cortex and posterior left frontal operculum extending into high posterior left parietal lobe MRA previously occluded left M2 segment has again occluded  2D Echo EF 60-65% LDL 54 HgbA1c 5.7 UDS positive for cocaine and THC VTE prophylaxis - lovenox aspirin 325 mg daily prior to admission, now on aspirin 81 mg daily and Brilinta (ticagrelor) 90 mg bid.  Therapy recommendations: Pending Disposition: Pending  Hx of stroke 06/2020 left BG, left MCA, MCA/PCA and left MCA/ACA scattered infarcts, MRA head and neck negative, TEE neg, EF 50-55%, no PFO, UDS neg, LDL 200 and A1C 6.0, discharged  on DAPT and lipitor 80  Hypertension Home meds: None Stable BP < 180/105 Long-term BP goal normotensive  Hyperlipidemia Home meds: Atorvastatin 40 mg daily, resumed in hospital LDL 54, goal < 70 Continue statin at discharge  Fever Leukocytosis Tmax 100.7->afebrile WBC 13.4 Continue monitoring  Dysphagia  Passed swallow, on nectar thick liquid Gentle hydration  Avoid aspiration  Cocaine abuse UDS positive for cocaine Cessation education will be provided  Tobacco abuse Current smoker Smoking cessation counseling will be provided  Other Stroke Risk Factors ETOH use, alcohol level <10, advised to drink no more than 1-2 drink(s) a day THC abuse - UDS:  THC POSITIVE Patient will be advised to stop using due to stroke risk.  Other Active  Problems Schizophrenia-continue home Abilify and Ingrezza Hypokalemia K 3.2 , supplement  Hospital day # 1  [name] , MSN, AGACNP-BC [facility] See Amion for schedule and pager information 03/22/2023 2:32 PM  ATTENDING NOTE: I reviewed above note and agree with the assessment and plan. Pt was seen and examined.   RN is at the bedside. Pt awake, alert, eyes open, global aphasia except able to close eyes on command and seems able to repeat "I am here" in severe dysarthric voice, otherwise not able to name, not following commands. Disconjugate gaze, left eye INO, right eye able to gaze bilaterally but incomplete. Tracking bilaterally but more left gaze preference, inconsistent blinking to visual threat bilaterally but seems less blinking on the right, PERRL. Right facial droop. Tongue protrusion not cooperative. LUE no drift, RUE flaccid. BLE 3/5 proximal and knee flexion. However, distally left ankle DF 5/5, right 4/5. Sensation, coordination not cooperative and gait not tested.   Discussed with Dr. Corliss Skains, pt etiology consistent with large vessel disease. MRI showed enlarged left MCA infarcts, and left M2 reoccluded. Pt has multiple uncontrolled risk factors including cocaine abuse, smoker and THC use. Now on DAPT and statin. PT and OT pending.   For detailed assessment and plan, please refer to above/below as I have made changes wherever appropriate.   Marvel Plan, MD PhD Stroke Neurology 03/22/2023 10:09 PM  This patient is critically ill due to increased left MCA stroke and left M2 reocclusion and at significant risk of neurological worsening, death form recurrent stroke and hemorrhagic conversion, cerebral edema, seizure. This patient's care requires constant monitoring of vital signs, hemodynamics, respiratory and cardiac monitoring, review of multiple databases, neurological assessment, discussion with family, other specialists and medical decision making of  high complexity. I spent 45 minutes of neurocritical care time in the care of this patient.   To contact Stroke Continuity provider, please refer to [email or web address]. After hours, contact General Neurology

## 2023-03-22 NOTE — Progress Notes (Signed)
OT Cancellation Note  Patient Details Name: Abelardo Hartsock MRN: [id number] DOB: 24-Dec-1980   Cancelled Treatment:    Reason Eval/Treat Not Completed: Patient not medically ready;Active bedrest order per IR, pt to remain on bedrest. Will follow up for OT eval, likely tomorrow.  Lorre Munroe 03/22/2023, 11:34 AM

## 2023-03-22 NOTE — Evaluation (Signed)
Speech Language Pathology Evaluation Patient Details Name: Brian Rios MRN: [id number] DOB: 02-11-1981 Today's Date: 03/22/2023 Time: 1340-1410 SLP Time Calculation (min) (ACUTE ONLY): 30 min  Problem List:  Patient Active Problem List   Diagnosis Date Noted   Stroke (cerebrum) (HCC) 03/21/2023   Middle cerebral artery embolism, 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   Follow-up exam 10/25/2022   Hospital discharge follow-up 09/20/2022   Small bowel mass 09/04/2022   S/P right colectomy 09/04/2022   Dental decay 06/12/2022   Chest pain 05/17/2022   Abnormal CT scan 12/28/2021   Elevated blood pressure reading 07/26/2021   Erectile dysfunction 07/26/2021   H/O elevated lipids 03/28/2021   Embolic stroke (HCC) 07/18/2020   Stroke (HCC) 07/17/2020   Drug abuse (HCC) 07/17/2020   Major depressive disorder, recurrent episode, severe (HCC) 02/20/2015   Alcohol use disorder, severe, dependence (HCC) 02/20/2015   Stimulant use disorder 02/20/2015   Tobacco use disorder 02/20/2015   Past Medical History:  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   left cerebrum   Erectile dysfunction    Heart murmur    Hyperlipidemia    Schizophrenia (HCC)    Tobacco abuse    Past Surgical History:  Past Surgical History:  Procedure Laterality Date   BOWEL RESECTION N/A 09/04/2022   Procedure: SMALL BOWEL RESECTION, open;  Surgeon: Leafy Ro, MD;  Location: [facility] ORS;  Service: General;  Laterality: N/A;   ORIF ORBITAL FRACTURE  1985   PARTIAL COLECTOMY Right 09/04/2022   Procedure: PARTIAL COLECTOMY;  Surgeon: Leafy Ro, MD;  Location: [facility] ORS;  Service: General;  Laterality: Right;  Right colectimy   RADIOLOGY WITH ANESTHESIA N/A 03/21/2023   Procedure: RADIOLOGY WITH ANESTHESIA;  Surgeon: Radiologist, Medication, MD;  Location: [facility] OR;  Service: Radiology;  Laterality: N/A;   TEE WITHOUT  CARDIOVERSION N/A 07/19/2020   Procedure: TRANSESOPHAGEAL ECHOCARDIOGRAM (TEE);  Surgeon: Lamar Blinks, MD;  Location: [facility] ORS;  Service: Cardiovascular;  Laterality: N/A;   WRIST SURGERY Left 11/07/2013   REPAIR ULNAR ARTERY, NERVE AND TENDON   HPI:  42 year old male presenting with new onset of right sided weakness, right facial droop and dysphasia secondary to occlusion of two proximal left M2 branches. Patchy acute left MCA infarcts superimposed on chronic infarcts.   Assessment / Plan / Recommendation Clinical Impression  Pt demonstrates severe aphasia with inability to repeat; dysfluent spontaneous speech that is mostly characterized by stereotypy. Pt able to identify objects in a field of two with groping, hesitating pointing behavior. When asked to point to body parts pt unable to initiate pointing and when guided to do so, pt with resistance. Does not like cueing or assistance. Pt finally agreeable to singing exercise and was able to say "you" after carrier phrase "Happy birthday to ... "Pt may be stimulable for assistive communication device. Will attempt next session. Recommend intensive rehabilitation setting for SLP interventions.    SLP Assessment  SLP Visit Diagnosis: Dysphagia, oropharyngeal phase (R13.12)    Recommendations for follow up therapy are one component of a multi-disciplinary discharge planning process, led by the attending physician.  Recommendations may be updated based on patient status, additional functional criteria and insurance authorization.    Follow Up Recommendations  Acute inpatient rehab (3hours/day)    Assistance Recommended at Discharge  Intermittent Supervision/Assistance  Functional Status Assessment Patient has had a recent  decline in their functional status and demonstrates the ability to make significant improvements in function in a reasonable and predictable amount of time.  Frequency and Duration min 2x/week         SLP  Evaluation Cognition  Overall Cognitive Status: Impaired/Different from baseline Arousal/Alertness: Awake/alert Orientation Level: Oriented to person Awareness: Impaired Awareness Impairment: Emergent impairment;Anticipatory impairment Problem Solving: Impaired Problem Solving Impairment: Functional basic       Comprehension  Auditory Comprehension Overall Auditory Comprehension: Impaired Yes/No Questions: Not tested Commands: Impaired Visual Recognition/Discrimination Discrimination: Within Function Limits Reading Comprehension Reading Status: Not tested    Expression Verbal Expression Overall Verbal Expression: Impaired Initiation: Impaired Automatic Speech: Name;Counting;Singing (only successful with singing, could not verbalize with other attempts) Repetition: Impaired Level of Impairment: Word level Naming: Impairment Verbal Errors:  (stereotypy)   Oral / Motor  Oral Motor/Sensory Function Overall Oral Motor/Sensory Function: Moderate impairment Facial ROM: Reduced right;Suspected CN VII (facial) dysfunction Facial Symmetry: Abnormal symmetry right;Suspected CN VII (facial) dysfunction Facial Strength: Reduced right;Suspected CN VII (facial) dysfunction Facial Sensation: Reduced right;Suspected CN V (Trigeminal) dysfunction Lingual ROM: Reduced right;Suspected CN XII (hypoglossal) dysfunction Lingual Symmetry: Abnormal symmetry right;Suspected CN XII (hypoglossal) dysfunction Motor Speech Overall Motor Speech: Impaired Respiration: Within functional limits Phonation: Breathy;Hoarse Motor Planning: Impaired Level of Impairment: Word            Burrell Hodapp, Riley Nearing 03/22/2023, 2:59 PM

## 2023-03-22 NOTE — Progress Notes (Signed)
Referring Provider(s): Caryl Pina  Supervising Physician: Julieanne Cotton  Patient Status:  [facility] - In-pt  Chief Complaint:  Stroke - s/p intervention  Brief History:  Brian Rios is a 42 y.o. male with alcohol abuse, depression, drug abuse, history of left basal ganglia embolic stroke in 2021, schizophrenia and tobacco abuse.  He presented to the [facility] ED yesterday with acute onset of right sided weakness, right facial droop and dysphasia.    CTA showed occlusion of two proximal left M2 branches, mild left M1 stenosis and widely patent carotid arteries.   He emergently transferred to [facility] for emergent intervention.  He underwent revascularization of occluded left MCA inferior division with 1 pass with a 3 mm x 20 mm Solitaire X retrieval device, 1 pass with 4 mm x 40 mm solitary X arterial device and contact aspiration and 2 passes with aspiration with sustained TICI 2C revascularization.    Subjective:  Lying in bed.   Allergies: Patient has no known allergies.  Medications: Prior to Admission medications   Medication Sig Start Date End Date Taking? Authorizing Provider  ARIPiprazole (ABILIFY) 30 MG tablet Take 1 tablet (30 mg total) by mouth at bedtime. 11/20/22     ARIPiprazole ER (ABILIFY MAINTENA) 400 MG PRSY prefilled syringe Inject 400 mg into the muscle every 30 (thirty) days.    [provider]  atorvastatin (LIPITOR) 40 MG tablet Take 1 tablet (40 mg total) by mouth daily. Must keep OV with Lanora Manis, NP on 10/25/22 prior to additional refills. 11/20/22   Iloabachie, Chioma E, NP  benztropine (COGENTIN) 0.5 MG tablet Take 2 tablets by mouth at beddtime 04/19/22     benztropine (COGENTIN) 1 MG tablet 1 po q hs 07/12/22     benztropine (COGENTIN) 1 MG tablet Take 1 tablet (1 mg total) by mouth at bedtime. 11/20/22     divalproex (DEPAKOTE ER) 250 MG 24 hr tablet 3 po q hs 07/12/22     divalproex (DEPAKOTE ER) 250 MG 24 hr tablet Take 3 tablets (750 mg  total) by mouth at bedtime. 11/20/22     escitalopram (LEXAPRO) 20 MG tablet 1 po q day Patient taking differently: Take 20 mg by mouth at bedtime. 07/12/22     escitalopram (LEXAPRO) 20 MG tablet Take 1 tablet (20 mg total) by mouth daily. 11/20/22     hydrOXYzine (ATARAX) 25 MG tablet Take 3 tablets (75 mg total) by mouth at bedtime as needed. 09/18/22     hydrOXYzine (ATARAX) 25 MG tablet Take 3 tablets (75 mg total) by mouth at bedtime as needed. 11/20/22     loperamide (IMODIUM) 2 MG capsule Take 2 capsules (4 mg total) by mouth 3 (three) times daily as needed for diarrhea or loose stools. 01/24/23   Donovan Kail, PA-C  metFORMIN (GLUCOPHAGE-XR) 500 MG 24 hr tablet Take 1 tablet (500 mg total) by mouth daily with breakfast. 09/20/22   Iloabachie, Chioma E, NP  mirtazapine (REMERON) 45 MG tablet 1 po q hs 07/12/22     mirtazapine (REMERON) 45 MG tablet Take 1 tablet (45 mg total) by mouth at bedtime. 11/20/22     tadalafil (CIALIS) 5 MG tablet Take 2-4 tablets (10-20 mg total) by mouth once daily as needed for erectile dysfunction. 10/04/21   Sondra Come, MD  valbenazine [facility]) 80 MG capsule Take one capsule by mouth at bedtime. 09/18/22     valbenazine (INGREZZA) 80 MG capsule Take one capsule by mouth at bedtime. 11/20/22  Vital Signs: BP 127/67 (BP Location: Right Arm)   Pulse 84   Temp 98.5 F (36.9 C) (Oral)   Resp 14   SpO2 97%   Physical Exam Alert, awake,+ Expressive aphasia EOMs do not move past midline + facial droop Does not follow command to stick out Tongue  5/5 Strength on left, no movement Right arm, some movement right leg. Common femoral artery puncture site looks good, no bleeding, no hematoma, no pseudoaneurysm  Labs:  CBC: Recent Labs    09/06/22 0542 09/07/22 0406 03/21/23 1518 03/21/23 2047 03/21/23 2106 03/21/23 2134 03/22/23 0443  WBC 15.6* 10.0 13.4*  --   --   --  18.4*  HGB 11.9* 11.7* 14.4 16.0 16.0 16.0 12.5*  HCT 36.2* 35.5*  44.2 47.0 47.0 47.0 37.8*  PLT 213 226 232  --   --   --  207    COAGS: Recent Labs    03/21/23 1518  INR 1.0  APTT 30    BMP: Recent Labs    09/06/22 0542 09/07/22 0406 03/21/23 1518 03/21/23 2047 03/21/23 2106 03/21/23 2134 03/22/23 0443  NA 143 143 138 137 138 138 137  K 4.1 3.9 3.2* 3.6 3.6 3.5 3.3*  CL 111 111 109  --   --   --  107  CO2 26 26 24   --   --   --  22  GLUCOSE 98 96 127*  --   --   --  106*  BUN 5* 6 9  --   --   --  <5*  CALCIUM 8.3* 8.4* 8.1*  --   --   --  7.5*  CREATININE 0.97 0.87 1.13  --   --   --  0.97  GFRNONAA >60 >60 >60  --   --   --  >60    LIVER FUNCTION TESTS: Recent Labs    07/18/22 0951 03/21/23 1518  BILITOT <0.2 0.6  AST 18 20  ALT 21 17  ALKPHOS 74 52  PROT 5.9* 5.4*  ALBUMIN 3.9* 2.9*    Assessment and Plan:  Occluded branch of the M2 inferior division of the left MCA .   Status post revascularization of occluded left MCA by Dr. Corliss Skains.  Patient remains aphasic and unable to move right arm.  Care per Neurology and CCM.  Electronically Signed: Gwynneth Macleod, PA-C 03/22/2023, 8:53 AM    I spent a total of 15 Minutes at the the patient's bedside AND on the patient's hospital floor or unit, greater than 50% of which was counseling/coordinating care for f/u stroke intervention.

## 2023-03-22 NOTE — Progress Notes (Signed)
PT Cancellation Note  Patient Details Name: Brian Rios MRN: [id number] DOB: 11/24/1980   Cancelled Treatment:    Reason Eval/Treat Not Completed: Active bedrest order  [name], PT DPT Acute Rehabilitation Services Secure Chat Preferred  Office [phone]   [name] [name] 03/22/2023, 11:17 AM

## 2023-03-22 NOTE — Progress Notes (Signed)
SLP Cancellation Note  Patient Details Name: Brian Rios MRN: [id number] DOB: Jun 25, 1981   Cancelled treatment:        Attempted to see pt for swallowing and cognitive-linguistic assessment.  Pt off floor for MRI at time of attempt.  SLP will reattempt as schedule permits.   Kerrie Pleasure, MA, CCC-SLP Acute Rehabilitation Services Office: [phone] 03/22/2023, 12:15 PM

## 2023-03-23 ENCOUNTER — Inpatient Hospital Stay (HOSPITAL_COMMUNITY): Payer: Medicaid Other

## 2023-03-23 DIAGNOSIS — I63512 Cerebral infarction due to unspecified occlusion or stenosis of left middle cerebral artery: Secondary | ICD-10-CM | POA: Diagnosis not present

## 2023-03-23 LAB — URINALYSIS, ROUTINE W REFLEX MICROSCOPIC
Bilirubin Urine: NEGATIVE
Glucose, UA: NEGATIVE mg/dL
Ketones, ur: 5 mg/dL — AB
Leukocytes,Ua: NEGATIVE
Nitrite: NEGATIVE
Protein, ur: NEGATIVE mg/dL
RBC / HPF: >50 RBC/hpf (ref 0–5)
Specific Gravity, Urine: 1.01 (ref 1.005–1.030)
pH: 8 (ref 5.0–8.0)

## 2023-03-23 LAB — GLUCOSE, CAPILLARY
Glucose-Capillary: 110 mg/dL — ABNORMAL HIGH (ref 70–99)
Glucose-Capillary: 101 mg/dL — ABNORMAL HIGH (ref 70–99)
Glucose-Capillary: 114 mg/dL — ABNORMAL HIGH (ref 70–99)

## 2023-03-23 LAB — BASIC METABOLIC PANEL
Anion gap: 8 (ref 5–15)
BUN: <5 mg/dL — ABNORMAL LOW (ref 6–20)
CO2: 23 mmol/L (ref 22–32)
Calcium: 8.1 mg/dL — ABNORMAL LOW (ref 8.9–10.3)
Chloride: 107 mmol/L (ref 98–111)
Creatinine, Ser: 0.95 mg/dL (ref 0.61–1.24)
GFR, Estimated: >60 mL/min (ref >60)
Glucose, Bld: 97 mg/dL (ref 70–99)
Potassium: 3.4 mmol/L — ABNORMAL LOW (ref 3.5–5.1)
Sodium: 138 mmol/L (ref 135–145)

## 2023-03-23 LAB — CBC
HCT: 38.8 % — ABNORMAL LOW (ref 39.0–52.0)
Hemoglobin: 12.8 g/dL — ABNORMAL LOW (ref 13.0–17.0)
MCH: 27.3 pg (ref 26.0–34.0)
MCHC: 33 g/dL (ref 30.0–36.0)
MCV: 82.7 fL (ref 80.0–100.0)
Platelets: 193 10*3/uL (ref 150–400)
RBC: 4.69 MIL/uL (ref 4.22–5.81)
RDW: 14.5 % (ref 11.5–15.5)
WBC: 14.7 10*3/uL — ABNORMAL HIGH (ref 4.0–10.5)
nRBC: 0 % (ref 0.0–0.2)

## 2023-03-23 LAB — MAGNESIUM: Magnesium: 1.7 mg/dL (ref 1.7–2.4)

## 2023-03-23 MED ORDER — SODIUM CHLORIDE 0.9 % IV SOLN
3.0000 g | Freq: Four times a day (QID) | INTRAVENOUS | Status: DC
  Administered 2023-03-23 – 2023-03-27 (×12): 3 g via INTRAVENOUS
  Filled 2023-03-23 (×14): qty 8

## 2023-03-23 MED ORDER — POTASSIUM CHLORIDE 20 MEQ PO PACK
60.0000 meq | PACK | Freq: Once | ORAL | Status: AC
  Administered 2023-03-23: 60 meq via ORAL
  Filled 2023-03-23: qty 3

## 2023-03-23 MED ORDER — MAGNESIUM SULFATE 2 GM/50ML IV SOLN
2.0000 g | Freq: Once | INTRAVENOUS | Status: AC
  Administered 2023-03-23: 2 g via INTRAVENOUS
  Filled 2023-03-23: qty 50

## 2023-03-23 MED ORDER — PIPERACILLIN-TAZOBACTAM 3.375 G IVPB
3.3750 g | Freq: Three times a day (TID) | INTRAVENOUS | Status: DC
  Administered 2023-03-23: 3.375 g via INTRAVENOUS
  Filled 2023-03-23 (×2): qty 50

## 2023-03-23 MED ORDER — DIVALPROEX SODIUM ER 500 MG PO TB24
750.0000 mg | ORAL_TABLET | Freq: Every day | ORAL | Status: DC
  Administered 2023-03-23 – 2023-03-26 (×4): 750 mg via ORAL
  Filled 2023-03-23 (×6): qty 1

## 2023-03-23 NOTE — Progress Notes (Addendum)
STROKE TEAM PROGRESS NOTE   INTERVAL HISTORY Patient is seen in his room with no family at the bedside.  He has had low-grade fevers, leukocytosis and right lower lobe pneumonia seen on chest x-ray.  Will start Zosyn.  He is otherwise hemodynamically stable, and neurological exam remains stable with continued transcortical aphasia.  Vitals:   03/23/23 0900 03/23/23 1000 03/23/23 1100 03/23/23 1129  BP: (!) 145/90 (!) 141/83 138/78   Pulse: 82  76   Resp: 16 13 17    Temp:    97.7 F (36.5 C)  TempSrc:    Oral  SpO2: 99%  97%    CBC:  Recent Labs  Lab 03/21/23 1518 03/21/23 2047 03/22/23 0443 03/23/23 0404  WBC 13.4*  --  18.4* 14.7*  NEUTROABS 10.0*  --   --   --   HGB 14.4   < > 12.5* 12.8*  HCT 44.2   < > 37.8* 38.8*  MCV 84.7  --  83.1 82.7  PLT 232  --  207 193   < > = values in this interval not displayed.    Basic Metabolic Panel:  Recent Labs  Lab 03/22/23 0443 03/23/23 0404  NA 137 138  K 3.3* 3.4*  CL 107 107  CO2 22 23  GLUCOSE 106* 97  BUN <5* <5*  CREATININE 0.97 0.95  CALCIUM 7.5* 8.1*  MG  --  1.7    Lipid Panel:  Recent Labs  Lab 03/22/23 0443  CHOL 113  TRIG 109  HDL 37*  CHOLHDL 3.1  VLDL 22  LDLCALC 54    HgbA1c: No results for input(s): "HGBA1C" in the last 168 hours. Urine Drug Screen:  Recent Labs  Lab 03/22/23 0107  LABOPIA NONE DETECTED  COCAINSCRNUR POSITIVE*  LABBENZ NONE DETECTED  AMPHETMU NONE DETECTED  THCU POSITIVE*  LABBARB NONE DETECTED     Alcohol Level  Recent Labs  Lab 03/21/23 1518  ETH <10     IMAGING past 24 hours DG CHEST PORT 1 VIEW  Result Date: 03/23/2023 CLINICAL DATA:  100030 Leukocytosis 100030 EXAM: PORTABLE CHEST - 1 VIEW COMPARISON:  CT 08/14/2022 FINDINGS: focal airspace opacity laterally at the right lung base. Relatively low lung volumes with some crowding of bronchovascular structures particularly in the lung bases. Heart size and mediastinal contours are within normal limits. No  effusion. Visualized bones unremarkable. IMPRESSION: Focal right lower lobe airspace disease. Electronically Signed   By: Corlis Leak M.D.   On: 03/23/2023 10:03   ECHOCARDIOGRAM COMPLETE  Result Date: 03/22/2023    ECHOCARDIOGRAM REPORT   Patient Name:   Brian Rios Date of Exam: 03/22/2023 Medical Rec #:  [id number]           Height:       70.0 in Accession #:    [id number]          Weight:       218.9 lb Date of Birth:  09/12/1981          BSA:          2.169 m Patient Age:    41 years            BP:           146/81 mmHg Patient Gender: M                   HR:           95 bpm. Exam Location:  Inpatient Procedure: 2D Echo, Cardiac  Doppler and Color Doppler Indications:    Stroke I63.9  History:        Patient has prior history of Echocardiogram examinations, most                 recent 07/18/2020. Stroke; Risk Factors:Hypertension and Current                 Smoker.  Sonographer:    Dondra Prader RVT RCS Referring Phys: [phone] ERIC LINDZEN  Sonographer Comments: Image acquisition challenging due to uncooperative patient. IMPRESSIONS  1. Left ventricular ejection fraction, by estimation, is 60 to 65%. The left ventricle has normal function. The left ventricle has no regional wall motion abnormalities. Left ventricular diastolic parameters were normal.  2. Right ventricular systolic function is normal. The right ventricular size is normal. There is normal pulmonary artery systolic pressure.  3. The mitral valve is normal in structure. No evidence of mitral valve regurgitation. No evidence of mitral stenosis.  4. The aortic valve is tricuspid. Aortic valve regurgitation is not visualized. No aortic stenosis is present.  5. The inferior vena cava is normal in size with greater than 50% respiratory variability, suggesting right atrial pressure of 3 mmHg. FINDINGS  Left Ventricle: Left ventricular ejection fraction, by estimation, is 60 to 65%. The left ventricle has normal function. The left ventricle has no  regional wall motion abnormalities. The left ventricular internal cavity size was normal in size. There is  no left ventricular hypertrophy. Left ventricular diastolic parameters were normal. Indeterminate filling pressures. Right Ventricle: The right ventricular size is normal. No increase in right ventricular wall thickness. Right ventricular systolic function is normal. There is normal pulmonary artery systolic pressure. The tricuspid regurgitant velocity is 1.80 m/s, and  with an assumed right atrial pressure of 3 mmHg, the estimated right ventricular systolic pressure is 16.0 mmHg. Left Atrium: Left atrial size was normal in size. Right Atrium: Right atrial size was normal in size. Pericardium: There is no evidence of pericardial effusion. Mitral Valve: The mitral valve is normal in structure. No evidence of mitral valve regurgitation. No evidence of mitral valve stenosis. Tricuspid Valve: The tricuspid valve is normal in structure. Tricuspid valve regurgitation is trivial. No evidence of tricuspid stenosis. Aortic Valve: The aortic valve is tricuspid. Aortic valve regurgitation is not visualized. No aortic stenosis is present. Aortic valve mean gradient measures 6.0 mmHg. Aortic valve peak gradient measures 12.1 mmHg. Aortic valve area, by VTI measures 2.33  cm. Pulmonic Valve: The pulmonic valve was normal in structure. Pulmonic valve regurgitation is not visualized. No evidence of pulmonic stenosis. Aorta: The aortic root is normal in size and structure. Venous: The inferior vena cava is normal in size with greater than 50% respiratory variability, suggesting right atrial pressure of 3 mmHg. IAS/Shunts: No atrial level shunt detected by color flow Doppler.  LEFT VENTRICLE PLAX 2D LVIDd:         4.00 cm   Diastology LVIDs:         2.90 cm   LV e' medial:    8.38 cm/s LV PW:         1.10 cm   LV E/e' medial:  10.9 LV IVS:        0.90 cm   LV e' lateral:   14.10 cm/s LVOT diam:     1.90 cm   LV E/e' lateral:  6.5 LV SV:         65 LV SV Index:   30 LVOT Area:  2.84 cm  RIGHT VENTRICLE             IVC RV Basal diam:  3.80 cm     IVC diam: 1.60 cm RV S prime:     12.60 cm/s TAPSE (M-mode): 2.4 cm LEFT ATRIUM             Index        RIGHT ATRIUM           Index LA diam:        2.90 cm 1.34 cm/m   RA Area:     18.80 cm LA Vol (A2C):   30.9 ml 14.25 ml/m  RA Volume:   59.50 ml  27.43 ml/m LA Vol (A4C):   39.6 ml 18.26 ml/m LA Biplane Vol: 37.3 ml 17.20 ml/m  AORTIC VALVE                     PULMONIC VALVE AV Area (Vmax):    2.53 cm      PV Vmax:       1.00 m/s AV Area (Vmean):   2.39 cm      PV Peak grad:  4.0 mmHg AV Area (VTI):     2.33 cm AV Vmax:           174.00 cm/s AV Vmean:          114.000 cm/s AV VTI:            0.278 m AV Peak Grad:      12.1 mmHg AV Mean Grad:      6.0 mmHg LVOT Vmax:         155.00 cm/s LVOT Vmean:        95.900 cm/s LVOT VTI:          0.228 m LVOT/AV VTI ratio: 0.82  AORTA Ao Root diam: 2.80 cm Ao Asc diam:  2.50 cm MITRAL VALVE               TRICUSPID VALVE MV Area (PHT): 4.19 cm    TR Peak grad:   13.0 mmHg MV Decel Time: 181 msec    TR Vmax:        180.00 cm/s MV E velocity: 91.70 cm/s MV A velocity: 70.50 cm/s  SHUNTS MV E/A ratio:  1.30        Systemic VTI:  0.23 m                            Systemic Diam: 1.90 cm Chilton Si MD Electronically signed by Chilton Si MD Signature Date/Time: 03/22/2023/4:04:21 PM    Final     PHYSICAL EXAM General: Alert, well-nourished, well-developed patient in no acute distress Respiratory: Regular, unlabored respirations Neurological: Pupils equal round and reactive, but gaze disconjugate with right eye unable to cross midline to the left and left eye unable to cross midline to the right.  He is able to intermittently follow commands.  Patient has global transcortical aphasia but is able to repeat simple sentences.  Right facial droop noted, 5 out of 5 strength in left upper extremity and left lower extremity, 3 out of 5  strength in right upper extremity and right lower extremity  ASSESSMENT/PLAN Brian Rios is a 42 y.o. male with history of depression, EtOH abuse, polysubstance abuse, embolic stroke in 2021, hyperlipidemia, schizophrenia and tobacco abuse presenting with acute onset right-sided weakness, right facial droop and dysarthria.  CTA revealed occlusion  of 2 proximal left M2 branches, and patient was brought here for mechanical thrombectomy.  TICI 2C revascularization was achieved.  This morning, patient has persistent transcortical aphasia as well as right-sided weakness.  Repeat MRI reveals large left MCA stroke.  Stroke:  left MCA territory stroke due to left M2 occlusion s/p IR with TICI2c but reoccluded, likely large vessel disease from uncontrolled risk factors and cocaine use Code Stroke CT head likely acute infarct in posterior left parietal lobe ASPECTS 9 CTA head & neck occlusion of 2 left proximal M2 branches, left M1 stenosis MRI 5/30: Patchy acute left MCA infarct superimposed on chronic infarct S/p IR with left M2 TICI2c reperfusion MRI 5/31 interval increase in size of the acute infarct involving the majority of the left and insular cortex and posterior left frontal operculum extending into high posterior left parietal lobe MRA previously occluded left M2 segment has again occluded  2D Echo EF 60-65% LDL 54 HgbA1c 5.7 UDS positive for cocaine and THC VTE prophylaxis - lovenox aspirin 325 mg daily prior to admission, now on aspirin 81 mg daily and Brilinta (ticagrelor) 90 mg bid.  Therapy recommendations: CIR Disposition: Pending  Hx of stroke 06/2020 left BG, left MCA, MCA/PCA and left MCA/ACA scattered infarcts, MRA head and neck negative, TEE neg, EF 50-55%, no PFO, UDS neg, LDL 200 and A1C 6.0, discharged on DAPT and lipitor 80  Hypertension Home meds: None Stable BP < 180/105 Long-term BP goal normotensive  Hyperlipidemia Home meds: Atorvastatin 40 mg daily,  resumed in hospital LDL 54, goal < 70 Continue statin at discharge  Fever Leukocytosis Tmax 100.7->afebrile-> continue low-grade fevers WBC 13.4->14.7 Urinalysis negative Chest x-ray reveals right lower lobe pneumonia Start Zosyn per pharmacy  Dysphagia  Passed swallow, on nectar thick liquid Gentle hydration  Avoid aspiration  Cocaine abuse UDS positive for cocaine Cessation education will be provided  Tobacco abuse Current smoker Smoking cessation counseling will be provided  Other Stroke Risk Factors ETOH use, alcohol level <10, advised to drink no more than 1-2 drink(s) a day THC abuse - UDS:  THC POSITIVE Patient will be advised to stop using due to stroke risk.  Other Active Problems Schizophrenia-continue home Abilify and Ingrezza Hypokalemia K 3.4 , supplement  Hospital day # 2  [name] , MSN, AGACNP-BC [facility] See Amion for schedule and pager information 03/23/2023 12:47 PM  ATTENDING ATTESTATION:  42 year old with acute left MCA stroke status post IR.  Now on aspirin and Brilinta.  Positive for cocaine and THC.  Did well overnight in ICU.  No drips required.  White blood cell count is 14 and febrile overnight.  Chest x-ray and UA obtained.  Showed right lower lobe pneumonia started on Zosyn but will switch to unasyn as this less likely HAP.  Will transfer out of the ICU today.  Dr. Viviann Spare evaluated pt independently, reviewed imaging, chart, labs. Discussed and formulated plan with the Resident/APP. Changes were made to the note where appropriate. Please see APP/resident note above for details.     Airanna Partin,MD    To contact Stroke Continuity provider, please refer to [email or web address]. After hours, contact General Neurology

## 2023-03-23 NOTE — Progress Notes (Signed)
Pharmacy Antibiotic Note  Marquice Crabbe is a 42 y.o. male admitted on 03/21/2023 with Stroke.  Pharmacy has been consulted for Zosyn dosing.  CrCl >100 mL/min  Plan: Zosyn 3.375g Q8H Pharmacy will monitor peripherally     Temp (24hrs), Avg:98.3 F (36.8 C), Min:97.7 F (36.5 C), Max:99 F (37.2 C)  Recent Labs  Lab 03/21/23 1518 03/22/23 0443 03/23/23 0404  WBC 13.4* 18.4* 14.7*  CREATININE 1.13 0.97 0.95    Estimated Creatinine Clearance: 118.4 mL/min (by C-G formula based on SCr of 0.95 mg/dL).    No Known Allergies   Thank you for allowing pharmacy to be a part of this patient's care.  Ellis Savage, PharmD Clinical Pharmacist 03/23/2023 1:01 PM

## 2023-03-23 NOTE — Discharge Summary (Shared)
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 s/p mechanical thrombectomy with TICI2c initial revascularization but subsequent reocclusion, likely large vessel disease from uncontrolled risk factors and cocaine use  Principal Problem:   Stroke (cerebrum) (HCC) Active Problems:   Middle cerebral artery occlusion, left Aphasia Cocaine abuse Hypertension Diabetes Cigarette abuse Right lower lobe pneumonia   Medications to be continued on Rehab Allergies as of 03/27/2023   No Known Allergies      Medication List     STOP taking these medications    acetaminophen 500 MG tablet Commonly known as: TYLENOL   aspirin 325 MG tablet Replaced by: aspirin 81 MG chewable tablet   hydrOXYzine 25 MG tablet Commonly known as: ATARAX   Ingrezza 80 MG capsule Generic drug: valbenazine   tadalafil 5 MG tablet Commonly known as: CIALIS       TAKE these medications    ARIPiprazole 30 MG tablet Commonly known as: ABILIFY Take 1 tablet (30 mg total) by mouth at bedtime. What changed:  when to take this Another medication with the same name was removed. Continue taking this medication, and follow the directions you see here.   aspirin 81 MG chewable tablet Chew 1 tablet (81 mg total) by mouth daily. Start taking on: March 28, 2023 Replaces: aspirin 325 MG tablet   atorvastatin 40 MG tablet Commonly known as: LIPITOR Take 1 tablet (40 mg total) by mouth daily. Must keep OV with Lanora Manis, NP on 10/25/22 prior to additional refills.   benztropine 1 MG tablet Commonly known as: COGENTIN Take 1 tablet (1 mg total) by mouth at bedtime. What changed:  medication strength how much to  take how to take this when to take this Another medication with the same name was removed. Continue taking this medication, and follow the directions you see here.   divalproex 250 MG 24 hr tablet Commonly known as: DEPAKOTE ER Take 3 tablets (750 mg total) by mouth at bedtime. What changed: when to take this   enoxaparin 40 MG/0.4ML injection Commonly known as: LOVENOX Inject 0.4 mLs (40 mg total) into the skin daily.   escitalopram 20 MG tablet Commonly known as: LEXAPRO Take 1 tablet (20 mg total) by mouth daily.   lisinopril 10 MG tablet Commonly known as: ZESTRIL Take 1 tablet (10 mg total) by mouth daily. Start taking on: March 28, 2023   loperamide 2 MG capsule Commonly known as: IMODIUM Take 2 capsules (4 mg total) by mouth 3 (three) times daily as needed for diarrhea or loose stools.   metFORMIN 500 MG 24 hr tablet Commonly known as: GLUCOPHAGE-XR Take 1 tablet (500 mg total) by mouth daily with breakfast.   mirtazapine 45 MG tablet Commonly known as: REMERON Take 1 tablet (45 mg total) by mouth at bedtime. What changed: when to take this   nicotine 21 mg/24hr patch Commonly known as: NICODERM CQ - dosed in mg/24 hours Place 1 patch (21 mg total) onto the skin daily. Start taking on: March 28, 2023   nicotine polacrilex 2 MG gum Commonly known as: NICORETTE Take 1 each (2 mg total) by mouth as needed for smoking cessation.   QUEtiapine 50 MG  tablet Commonly known as: SEROQUEL Take 1 tablet (50 mg total) by mouth 3 (three) times daily as needed (agitation).   senna-docusate 8.6-50 MG tablet Commonly known as: Senokot-S Take 1 tablet by mouth at bedtime as needed for mild constipation.   ticagrelor 90 MG Tabs tablet Commonly known as: BRILINTA Take 1 tablet (90 mg total) by mouth 2 (two) times daily.        LABORATORY STUDIES CBC    Component Value Date/Time   WBC 11.6 (H) 03/26/2023 0639   RBC 4.72 03/26/2023 0639   HGB 13.0 03/26/2023 0639    HGB 14.3 07/18/2022 0951   HCT 39.5 03/26/2023 0639   HCT 42.8 07/18/2022 0951   PLT 237 03/26/2023 0639   PLT 229 07/18/2022 0951   MCV 83.7 03/26/2023 0639   MCV 82 07/18/2022 0951   MCV 83 02/14/2015 1915   MCH 27.5 03/26/2023 0639   MCHC 32.9 03/26/2023 0639   RDW 14.0 03/26/2023 0639   RDW 13.6 07/18/2022 0951   RDW 14.5 02/14/2015 1915   LYMPHSABS 2.2 03/21/2023 1518   LYMPHSABS 1.9 07/18/2022 0951   MONOABS 1.0 03/21/2023 1518   EOSABS 0.1 03/21/2023 1518   EOSABS 0.1 07/18/2022 0951   BASOSABS 0.1 03/21/2023 1518   BASOSABS 0.1 07/18/2022 0951   CMP    Component Value Date/Time   NA 140 03/26/2023 0639   NA 138 07/18/2022 0951   NA 137 02/14/2015 1915   K 3.8 03/26/2023 0639   K 3.7 02/14/2015 1915   CL 106 03/26/2023 0639   CL 107 02/14/2015 1915   CO2 22 03/26/2023 0639   CO2 21 (L) 02/14/2015 1915   GLUCOSE 86 03/26/2023 0639   GLUCOSE 85 02/14/2015 1915   BUN 6 03/26/2023 0639   BUN 12 07/18/2022 0951   BUN 12 02/14/2015 1915   CREATININE 1.10 03/26/2023 0639   CREATININE 0.88 02/14/2015 1915   CALCIUM 8.7 (L) 03/26/2023 0639   CALCIUM 9.3 02/14/2015 1915   PROT 5.4 (L) 03/21/2023 1518   PROT 5.9 (L) 07/18/2022 0951   PROT 8.3 (H) 02/14/2015 1915   ALBUMIN 2.9 (L) 03/21/2023 1518   ALBUMIN 3.9 (L) 07/18/2022 0951   ALBUMIN 4.7 02/14/2015 1915   AST 20 03/21/2023 1518   AST 25 02/14/2015 1915   ALT 17 03/21/2023 1518   ALT 32 02/14/2015 1915   ALKPHOS 52 03/21/2023 1518   ALKPHOS 62 02/14/2015 1915   BILITOT 0.6 03/21/2023 1518   BILITOT <0.2 07/18/2022 0951   BILITOT 0.3 02/14/2015 1915   GFRNONAA >60 03/26/2023 0639   GFRNONAA >60 02/14/2015 1915   GFRAA >60 07/17/2020 1338   GFRAA >60 02/14/2015 1915   COAGS Lab Results  Component Value Date   INR 1.0 03/21/2023   INR 1.0 07/17/2020   Lipid Panel    Component Value Date/Time   CHOL 113 03/22/2023 0443   CHOL 156 07/18/2022 0951   TRIG 109 03/22/2023 0443   HDL 37 (L) 03/22/2023  0443   HDL 51 07/18/2022 0951   CHOLHDL 3.1 03/22/2023 0443   VLDL 22 03/22/2023 0443   LDLCALC 54 03/22/2023 0443   LDLCALC 87 07/18/2022 0951   HgbA1C  Lab Results  Component Value Date   HGBA1C 5.6 03/23/2023   Urinalysis    Component Value Date/Time   COLORURINE YELLOW 03/23/2023 1117   APPEARANCEUR CLOUDY (A) 03/23/2023 1117   APPEARANCEUR Clear 02/14/2015 1915   LABSPEC 1.010 03/23/2023 1117   LABSPEC 1.004 02/14/2015 1915  PHURINE 8.0 03/23/2023 1117   GLUCOSEU NEGATIVE 03/23/2023 1117   GLUCOSEU Negative 02/14/2015 1915   HGBUR LARGE (A) 03/23/2023 1117   BILIRUBINUR NEGATIVE 03/23/2023 1117   BILIRUBINUR Negative 02/14/2015 1915   KETONESUR 5 (A) 03/23/2023 1117   PROTEINUR NEGATIVE 03/23/2023 1117   NITRITE NEGATIVE 03/23/2023 1117   LEUKOCYTESUR NEGATIVE 03/23/2023 1117   LEUKOCYTESUR Negative 02/14/2015 1915   Urine Drug Screen     Component Value Date/Time   LABOPIA NONE DETECTED 03/22/2023 0107   COCAINSCRNUR POSITIVE (A) 03/22/2023 0107   COCAINSCRNUR NONE DETECTED 09/04/2022 0801   LABBENZ NONE DETECTED 03/22/2023 0107   AMPHETMU NONE DETECTED 03/22/2023 0107   THCU POSITIVE (A) 03/22/2023 0107   LABBARB NONE DETECTED 03/22/2023 0107    Alcohol Level    Component Value Date/Time   ETH <10 03/21/2023 1518     SIGNIFICANT DIAGNOSTIC STUDIES DG Swallowing Func-Speech Pathology  Result Date: 03/24/2023 Table formatting from the original result was not included. Modified Barium Swallow Study Patient Details Name: Octavio Milton MRN: [id number] Date of Birth: Nov 26, 1980 Today's Date: 03/24/2023 HPI/PMH: HPI: 42 year old male presenting with new onset of right sided weakness, right facial droop and dysphasia secondary to occlusion of two proximal left M2 branches. Patchy acute left MCA infarcts superimposed on chronic infarcts. Clinical Impression: Clinical Impression: Pt presents with a mild oropharyngeal dysphagia c/b premature spillage, delayed  swallow initiation, incomplete laryngeal closure, and diminished sensation.  These deficits resulted in mild, silent aspiration of thin liquid prior to the swallow x1 with serial straw sips of thin liquid.  Pt was unable to generate a cued cough to attempt to clear.  Additional trials of thin liquid by straw sips resulted in trace, transient penetration of thin liquid with serial straws.  There was no penetration or aspiration of any other consistencies.  Pt exhibted good oral clearance of solids after slightly prolonged oral phase.  With pill simulation, there was brief oral stasis of tablet on posterior lingual surface which cleared with liquid wash.  There was no increased penetration of thin liquid during pill simulation.  On esophageal sweep there was retention of contrast in the esophagus with backflow noted, especially with belching.  Recommend regular texture diet with thin liquids by cup.  Recommend close supervision with POs 2/2 impulsivity. Factors that may increase risk of adverse event in presence of aspiration Rubye Oaks [employer] Clearance Coots 2021): No data recorded Recommendations/Plan: Swallowing Evaluation Recommendations Swallowing Evaluation Recommendations Recommendations: PO diet PO Diet Recommendation: Regular; Thin liquids (Level 0) Liquid Administration via: Cup; No straw Medication Administration: Whole meds with liquid Supervision: Full supervision/cueing for swallowing strategies Swallowing strategies: Slow rate; Small bites/sips; Single bites; Single sips Postural changes: Position pt fully upright for meals Oral care recommendations: Oral care BID (2x/day) Treatment Plan Treatment Plan Treatment recommendations: Therapy as outlined in treatment plan below Follow-up recommendations: Acute inpatient rehab (3 hours/day) Functional status assessment: Patient has had a recent decline in their functional status and demonstrates the ability to make significant improvements in function in a reasonable and  predictable amount of time. Treatment frequency: Min 2x/week Treatment duration: 2 weeks Interventions: Aspiration precaution training; Diet toleration management by SLP Recommendations Recommendations for follow up therapy are one component of a multi-disciplinary discharge planning process, led by the attending physician.  Recommendations may be updated based on patient status, additional functional criteria and insurance authorization. Assessment: Orofacial Exam: Orofacial Exam Oral Cavity: Oral Hygiene: WFL Oral Cavity - Dentition: Adequate natural dentition Orofacial Anatomy: [facility] Oral  Motor/Sensory Function: -- (See BSE) Anatomy: No data recorded Boluses Administered: Boluses Administered Boluses Administered: Thin liquids (Level 0); Mildly thick liquids (Level 2, nectar thick); Moderately thick liquids (Level 3, honey thick); Puree; Solid  Oral Impairment Domain: Oral Impairment Domain Lip Closure: Escape beyond mid-chin (R) Tongue control during bolus hold: Posterior escape of less than half of bolus Bolus preparation/mastication: Timely and efficient chewing and mashing Bolus transport/lingual motion: Brisk tongue motion Oral residue: Complete oral clearance Location of oral residue : N/A Initiation of pharyngeal swallow : Pyriform sinuses  Pharyngeal Impairment Domain: Pharyngeal Impairment Domain Soft palate elevation: No bolus between soft palate (SP)/pharyngeal wall (PW) Laryngeal elevation: Complete superior movement of thyroid cartilage with complete approximation of arytenoids to epiglottic petiole Anterior hyoid excursion: Partial anterior movement Epiglottic movement: Complete inversion Laryngeal vestibule closure: Incomplete, narrow column air/contrast in laryngeal vestibule Pharyngeal stripping wave : Present - complete Pharyngeal contraction (A/P view only): N/A Pharyngoesophageal segment opening: Complete distension and complete duration, no obstruction of flow Tongue base retraction: Trace  column of contrast or air between tongue base and PPW Pharyngeal residue: Trace residue within or on pharyngeal structures Location of pharyngeal residue: Pyriform sinuses; Valleculae  Esophageal Impairment Domain: Esophageal Impairment Domain Esophageal clearance upright position: Esophageal retention with retrograde flow below pharyngoesophageal segment (PES) Pill: Esophageal Impairment Domain Esophageal clearance upright position: Esophageal retention with retrograde flow below pharyngoesophageal segment (PES) Penetration/Aspiration Scale Score: Penetration/Aspiration Scale Score 1.  Material does not enter airway: Mildly thick liquids (Level 2, nectar thick); Thin liquids (Level 0); Moderately thick liquids (Level 3, honey thick); Puree; Solid; Pill 2.  Material enters airway, remains ABOVE vocal cords then ejected out: Thin liquids (Level 0) 8.  Material enters airway, passes BELOW cords without attempt by patient to eject out (silent aspiration) : Thin liquids (Level 0) Compensatory Strategies: Compensatory Strategies Compensatory strategies: Yes Other(comment): Ineffective (Cued cough)   General Information: No data recorded No data recorded  No data recorded  No data recorded  No data recorded  No data recorded Behavior/Cognition: Alert; Cooperative No data recorded No data recorded No data recorded Volitional Swallow: Able to elicit Exam Limitations: No limitations Goal Planning: Prognosis for improved oropharyngeal function: Good No data recorded No data recorded No data recorded Consulted and agree with results and recommendations: Patient; Nurse Pain: Pain Assessment Pain Assessment: Faces Faces Pain Scale: 0 Facial Expression: 0 Body Movements: 0 Muscle Tension: 0 Compliance with ventilator (intubated pts.): N/A Vocalization (extubated pts.): 0 CPOT Total: 0 Pain Location: shaking head no when asked about pain Pain Intervention(s): Limited activity within patient's tolerance; Monitored during session;  Repositioned End of Session: Start Time:SLP Start Time (ACUTE ONLY): 1400 Stop Time: SLP Stop Time (ACUTE ONLY): 1415 Time Calculation:SLP Time Calculation (min) (ACUTE ONLY): 15 min Charges: SLP Evaluations $ SLP Speech Visit: 1 Visit SLP Evaluations $BSS Swallow: 1 Procedure $ SLP EVAL LANGUAGE/SOUND PRODUCTION: 1 Procedure $Swallowing Treatment: 1 Procedure $Speech Treatment for Individual: 1 Procedure SLP visit diagnosis: SLP Visit Diagnosis: Dysphagia, oropharyngeal phase (R13.12) Past Medical History: Past Medical History: Diagnosis Date  Alcohol abuse   Depression   Drug abuse (HCC)   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  Erectile dysfunction   Heart murmur   Hyperlipidemia   Schizophrenia (HCC)   Tobacco abuse  Past Surgical History: Past Surgical History: Procedure Laterality Date  BOWEL RESECTION N/A 09/04/2022  Procedure: SMALL BOWEL RESECTION, open;  Surgeon: Leafy Ro, MD;  Location: [facility] ORS;  Service: General;  Laterality: N/A;  IR CT HEAD LTD  03/21/2023  IR CT HEAD LTD  03/21/2023  IR PERCUTANEOUS ART THROMBECTOMY/INFUSION INTRACRANIAL INC DIAG ANGIO  03/21/2023  ORIF ORBITAL FRACTURE  1985  PARTIAL COLECTOMY Right 09/04/2022  Procedure: PARTIAL COLECTOMY;  Surgeon: Leafy Ro, MD;  Location: [facility] ORS;  Service: General;  Laterality: Right;  Right colectimy  RADIOLOGY WITH ANESTHESIA N/A 03/21/2023  Procedure: RADIOLOGY WITH ANESTHESIA;  Surgeon: Radiologist, Medication, MD;  Location: [facility] OR;  Service: Radiology;  Laterality: N/A;  TEE WITHOUT CARDIOVERSION N/A 07/19/2020  Procedure: TRANSESOPHAGEAL ECHOCARDIOGRAM (TEE);  Surgeon: Lamar Blinks, MD;  Location: [facility] ORS;  Service: Cardiovascular;  Laterality: N/A;  WRIST SURGERY Left 11/07/2013  REPAIR ULNAR ARTERY, NERVE AND TENDON Kerrie Pleasure, MA, CCC-SLP Acute Rehabilitation Services Office: [phone] 03/24/2023, 12:12 PM  IR PERCUTANEOUS ART THROMBECTOMY/INFUSION INTRACRANIAL INC DIAG ANGIO  Result  Date: 03/23/2023 INDICATION: New onset of expressive aphasia and right-sided weakness and facial droop. 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 EXAM: 1. EMERGENT LARGE VESSEL OCCLUSION THROMBOLYSIS (anterior CIRCULATION) COMPARISON:  CT angiogram Mar 21, 2023, and MRI of the brain Mar 21, 2023. MEDICATIONS: Ancef 2 g IV antibiotic was administered within 1 hour of the procedure. ANESTHESIA/SEDATION: General anesthesia. CONTRAST:  Omnipaque 300 approximately 125 mL. FLUOROSCOPY TIME:  Fluoroscopy Time: 23 minutes 448 seconds (1438 mGy). COMPLICATIONS: None immediate. TECHNIQUE: Following a full explanation of the procedure along with the potential associated complications, an informed witnessed consent was obtained. The risks of intracranial hemorrhage of 10%, worsening neurological deficit, ventilator dependency, death and inability to revascularize were all reviewed in detail with the patient's aunt. The patient was then put under general anesthesia by the Department of Anesthesiology at [facility]. The right groin was prepped and draped in the usual sterile fashion. Thereafter using modified Seldinger technique, transfemoral access into the right common femoral artery was obtained without difficulty. Over an 0.035 inch guidewire an 8 French 15 cm sheath was inserted. Through this, and also over an 0.035 inch guidewire a combination of a Berenstein 125 cm support catheter inside of a 95 cm 087 balloon guide was advanced to the aortic arch region and selectively positioned in the left common carotid artery initially and then in the distal left internal carotid artery. FINDINGS: The left common carotid arteriogram demonstrates the left external carotid artery and its major branches to be widely patent. The left internal carotid artery at the bulb to the cranial skull base is  widely patent. The petrous, the cavernous and the supraclinoid ICA are patent. The left anterior cerebral artery opacifies into the capillary and venous phases. Prompt cross-filling via the anterior communicating artery of the right anterior cerebral artery A2 segment and distally is noted. The left middle cerebral artery proximally demonstrates a moderate stenosis. The inferior division is widely patent. The superior division at its origin and extending into the distal M3 region demonstrates an area of irregularity associated with a significant stenosis. There is also a small stump like configuration arising from the more proximal portion of the superior division projecting posteriorly. PROCEDURE: Through the balloon guide catheter now in the distal left ICA, a combination of a 045 Zoom aspiration catheter with a 160 cm 021 microcatheter was advanced using a standard 014 micro guidewire with a moderate J configuration to the distal M1 segment. The microcatheter was then  advanced without difficulty into the superior division distal M3 segment followed by the microcatheter. The guidewire was removed. Brisk aspiration was obtained from the hub of the microcatheter which was then connected to continuous heparinized saline infusion. A 3 mm x 40 mm Solitaire X retrieval device was then advanced to the distal end of the microcatheter and deployed such that the proximal portion of the device was just proximal to the origin of the inferior division. The 045 Zoom aspiration catheter was then advanced to engage the proximal portion of the device. Contact aspiration was then applied at the hub of the Zoom aspiration catheter and at the hub of the balloon guide catheter for approximately a minute and a half. Thereafter, the combination of the retrieval device, the micro guidewire and the microcatheter were retrieved and removed. A control arteriogram performed through the balloon guide catheter demonstrated patency of the  inferior division. There was progressive narrowing with the previously noted filling defects at the proximal aspect of the superior division. Four aliquots of 25 mcg of nitroglycerin, were then infused intra-arterially with a modest improvement in the caliber of the proximal superior division with minimal relief. With modest improvement in the caliber of the superior division just distal to its origin, a faint patency was evident of a side branch arising from the proximal superior division projecting posteriorly. This demonstrated a filling defect within it with slow antegrade flow distal to this. The combination of the 045 Zoom aspiration catheter with a 021 microcatheter was then advanced to the distal left M1 segment over an 014 inch micro guidewire with a moderate J configuration. Using a torque device access into the partially recanalized branch was obtained with the micro guidewire followed by the microcatheter. The guidewire was removed. Good aspiration obtained from the hub of the microcatheter. A control arteriogram performed through the microcatheter demonstrated prominent vessel distal to this. This was then connected to continuous heparinized saline infusion. A 4 mm x 40 mm Solitaire X retrieval device was then advanced to the distal end of the microcatheter. This was deployed in the usual manner such that the proximal portion of the device was just proximal to the origin of the superior division. Constant aspiration was then applied through the Zoom aspiration catheter which has been advanced to the origin of the superior division for a minute and a half. Thereafter, with proximal flow arrest, and constant aspiration applied at the hub of the Zoom aspiration catheter and the balloon guide catheter, the combination was retrieved and removed. A control arteriogram performed through the balloon guide catheter demonstrated complete occlusion of the superior division with the stent at its origin. 2 aliquots  of nitroglycerin and 2.5 mg of verapamil were then infused intra-arterially. A third pass was now made using a Vecta 46 aspiration catheter which was advanced with the catheter over an 014 inch soft tip Aristotle micro guidewire. The micro guidewire was advanced through the occluded superior division into the M2 M3 junction followed by the microcatheter. The guidewire was removed. Good aspiration obtained from the hub of the microcatheter. The Philipp Ovens was then advanced to engage just distal to the origin of the superior division. The microcatheter and micro guidewire were removed. Following aspiration for a minute and a half, the Vecta catheter was removed. A control arteriogram performed through the balloon guide catheter demonstrated revascularization of the superior division including the first branch projecting posteriorly. An irregular filling defect was evident at the origin of the initial branch and distal to  this. A TICI 3 revascularization was achieved. A control arteriogram performed 2-3 minutes later demonstrated complete reocclusion of the superior division just distal to the first branch from it. A fourth pass was made again with a Vecta 46 aspiration catheter again advanced over the micro guidewire and a microcatheter as described above the distal end of the left M1 segment. Access through the occluded superior division was obtained with the micro guidewire followed by the microcatheter followed by advancement of the Vecta 46 aspiration catheter just distal to the origin of the superior division. Aspiration was then performed at the hub of the Vecta 46 aspiration catheter for approximately a minute and a half after removal of the microcatheter and the micro guidewire. A control arteriogram performed through the balloon guide catheter now demonstrated a complete revascularization of the superior division with the exception of the first branch from the proximal portion of the superior division achieving a  TICI 2C revascularization. Worsening spasm in the left M1 segment was also noted. This responded to 25 mcg of nitroglycerin intra-arterially. It was decided to stop at this juncture. A final control arteriogram performed through the balloon guide catheter in the left common carotid artery demonstrate patency of the internal carotid artery extra cranially and intracranially with a stable TICI 2C revascularization of the left MCA distribution. An 8 French Angio-Seal closure device was then deployed at the groin puncture site for hemostasis. Distal pulses remained stable unchanged from prior to the procedure. A flat panel CT of the brain demonstrated no evidence of intracranial hemorrhage or mass effect. Patient was extubated. Pupils were a 5-6 mm equals left sluggishly reactive. Patient moved his left side spontaneously but followed no commands or showed movement in the right arm and leg. He was the transferred to the MICU for post revascularization care. Medications utilized aspirin 81 mg and Brilinta 90 mg via an orogastric tube early in anticipation of placement of a stent. IMPRESSION: Status post revascularization of occluded superior division of the left middle cerebral artery mid M2 segment, with 1 pass with a 3 mm x 20 mm Solitaire X retrieval device and contact aspiration, and 1 pass with a 4 mm x 40 mm Solitaire X retrieval device and contact aspiration, and 2 passes with the direct contact aspiration achieving a TICI 2C revascularization. PLAN: As per referring MD. Electronically Signed   By: Julieanne Cotton M.D.   On: 03/23/2023 11:06   IR CT Head Ltd  Result Date: 03/23/2023 INDICATION: New onset of expressive aphasia and right-sided weakness and facial droop. 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 EXAM: 1. EMERGENT LARGE VESSEL OCCLUSION THROMBOLYSIS (anterior  CIRCULATION) COMPARISON:  CT angiogram Mar 21, 2023, and MRI of the brain Mar 21, 2023. MEDICATIONS: Ancef 2 g IV antibiotic was administered within 1 hour of the procedure. ANESTHESIA/SEDATION: General anesthesia. CONTRAST:  Omnipaque 300 approximately 125 mL. FLUOROSCOPY TIME:  Fluoroscopy Time: 23 minutes 448 seconds (1438 mGy). COMPLICATIONS: None immediate. TECHNIQUE: Following a full explanation of the procedure along with the potential associated complications, an informed witnessed consent was obtained. The risks of intracranial hemorrhage of 10%, worsening neurological deficit, ventilator dependency, death and inability to revascularize were all reviewed in detail with the patient's aunt. The patient was then put under general anesthesia by the Department of Anesthesiology at [facility]. The right groin was prepped and draped in the  usual sterile fashion. Thereafter using modified Seldinger technique, transfemoral access into the right common femoral artery was obtained without difficulty. Over an 0.035 inch guidewire an 8 French 15 cm sheath was inserted. Through this, and also over an 0.035 inch guidewire a combination of a Berenstein 125 cm support catheter inside of a 95 cm 087 balloon guide was advanced to the aortic arch region and selectively positioned in the left common carotid artery initially and then in the distal left internal carotid artery. FINDINGS: The left common carotid arteriogram demonstrates the left external carotid artery and its major branches to be widely patent. The left internal carotid artery at the bulb to the cranial skull base is widely patent. The petrous, the cavernous and the supraclinoid ICA are patent. The left anterior cerebral artery opacifies into the capillary and venous phases. Prompt cross-filling via the anterior communicating artery of the right anterior cerebral artery A2 segment and distally is noted. The left middle cerebral artery proximally  demonstrates a moderate stenosis. The inferior division is widely patent. The superior division at its origin and extending into the distal M3 region demonstrates an area of irregularity associated with a significant stenosis. There is also a small stump like configuration arising from the more proximal portion of the superior division projecting posteriorly. PROCEDURE: Through the balloon guide catheter now in the distal left ICA, a combination of a 045 Zoom aspiration catheter with a 160 cm 021 microcatheter was advanced using a standard 014 micro guidewire with a moderate J configuration to the distal M1 segment. The microcatheter was then advanced without difficulty into the superior division distal M3 segment followed by the microcatheter. The guidewire was removed. Brisk aspiration was obtained from the hub of the microcatheter which was then connected to continuous heparinized saline infusion. A 3 mm x 40 mm Solitaire X retrieval device was then advanced to the distal end of the microcatheter and deployed such that the proximal portion of the device was just proximal to the origin of the inferior division. The 045 Zoom aspiration catheter was then advanced to engage the proximal portion of the device. Contact aspiration was then applied at the hub of the Zoom aspiration catheter and at the hub of the balloon guide catheter for approximately a minute and a half. Thereafter, the combination of the retrieval device, the micro guidewire and the microcatheter were retrieved and removed. A control arteriogram performed through the balloon guide catheter demonstrated patency of the inferior division. There was progressive narrowing with the previously noted filling defects at the proximal aspect of the superior division. Four aliquots of 25 mcg of nitroglycerin, were then infused intra-arterially with a modest improvement in the caliber of the proximal superior division with minimal relief. With modest improvement  in the caliber of the superior division just distal to its origin, a faint patency was evident of a side branch arising from the proximal superior division projecting posteriorly. This demonstrated a filling defect within it with slow antegrade flow distal to this. The combination of the 045 Zoom aspiration catheter with a 021 microcatheter was then advanced to the distal left M1 segment over an 014 inch micro guidewire with a moderate J configuration. Using a torque device access into the partially recanalized branch was obtained with the micro guidewire followed by the microcatheter. The guidewire was removed. Good aspiration obtained from the hub of the microcatheter. A control arteriogram performed through the microcatheter demonstrated prominent vessel distal to this. This was then connected to continuous heparinized  saline infusion. A 4 mm x 40 mm Solitaire X retrieval device was then advanced to the distal end of the microcatheter. This was deployed in the usual manner such that the proximal portion of the device was just proximal to the origin of the superior division. Constant aspiration was then applied through the Zoom aspiration catheter which has been advanced to the origin of the superior division for a minute and a half. Thereafter, with proximal flow arrest, and constant aspiration applied at the hub of the Zoom aspiration catheter and the balloon guide catheter, the combination was retrieved and removed. A control arteriogram performed through the balloon guide catheter demonstrated complete occlusion of the superior division with the stent at its origin. 2 aliquots of nitroglycerin and 2.5 mg of verapamil were then infused intra-arterially. A third pass was now made using a Vecta 46 aspiration catheter which was advanced with the catheter over an 014 inch soft tip Aristotle micro guidewire. The micro guidewire was advanced through the occluded superior division into the M2 M3 junction followed by  the microcatheter. The guidewire was removed. Good aspiration obtained from the hub of the microcatheter. The Philipp Ovens was then advanced to engage just distal to the origin of the superior division. The microcatheter and micro guidewire were removed. Following aspiration for a minute and a half, the Vecta catheter was removed. A control arteriogram performed through the balloon guide catheter demonstrated revascularization of the superior division including the first branch projecting posteriorly. An irregular filling defect was evident at the origin of the initial branch and distal to this. A TICI 3 revascularization was achieved. A control arteriogram performed 2-3 minutes later demonstrated complete reocclusion of the superior division just distal to the first branch from it. A fourth pass was made again with a Vecta 46 aspiration catheter again advanced over the micro guidewire and a microcatheter as described above the distal end of the left M1 segment. Access through the occluded superior division was obtained with the micro guidewire followed by the microcatheter followed by advancement of the Vecta 46 aspiration catheter just distal to the origin of the superior division. Aspiration was then performed at the hub of the Vecta 46 aspiration catheter for approximately a minute and a half after removal of the microcatheter and the micro guidewire. A control arteriogram performed through the balloon guide catheter now demonstrated a complete revascularization of the superior division with the exception of the first branch from the proximal portion of the superior division achieving a TICI 2C revascularization. Worsening spasm in the left M1 segment was also noted. This responded to 25 mcg of nitroglycerin intra-arterially. It was decided to stop at this juncture. A final control arteriogram performed through the balloon guide catheter in the left common carotid artery demonstrate patency of the internal carotid  artery extra cranially and intracranially with a stable TICI 2C revascularization of the left MCA distribution. An 8 French Angio-Seal closure device was then deployed at the groin puncture site for hemostasis. Distal pulses remained stable unchanged from prior to the procedure. A flat panel CT of the brain demonstrated no evidence of intracranial hemorrhage or mass effect. Patient was extubated. Pupils were a 5-6 mm equals left sluggishly reactive. Patient moved his left side spontaneously but followed no commands or showed movement in the right arm and leg. He was the transferred to the MICU for post revascularization care. Medications utilized aspirin 81 mg and Brilinta 90 mg via an orogastric tube early in anticipation of placement of  a stent. IMPRESSION: Status post revascularization of occluded superior division of the left middle cerebral artery mid M2 segment, with 1 pass with a 3 mm x 20 mm Solitaire X retrieval device and contact aspiration, and 1 pass with a 4 mm x 40 mm Solitaire X retrieval device and contact aspiration, and 2 passes with the direct contact aspiration achieving a TICI 2C revascularization. PLAN: As per referring MD. Electronically Signed   By: Julieanne Cotton M.D.   On: 03/23/2023 11:06   IR CT Head Ltd  Result Date: 03/23/2023 INDICATION: New onset of expressive aphasia and right-sided weakness and facial droop. 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 EXAM: 1. EMERGENT LARGE VESSEL OCCLUSION THROMBOLYSIS (anterior CIRCULATION) COMPARISON:  CT angiogram Mar 21, 2023, and MRI of the brain Mar 21, 2023. MEDICATIONS: Ancef 2 g IV antibiotic was administered within 1 hour of the procedure. ANESTHESIA/SEDATION: General anesthesia. CONTRAST:  Omnipaque 300 approximately 125 mL. FLUOROSCOPY TIME:  Fluoroscopy Time: 23 minutes 448 seconds (1438 mGy).  COMPLICATIONS: None immediate. TECHNIQUE: Following a full explanation of the procedure along with the potential associated complications, an informed witnessed consent was obtained. The risks of intracranial hemorrhage of 10%, worsening neurological deficit, ventilator dependency, death and inability to revascularize were all reviewed in detail with the patient's aunt. The patient was then put under general anesthesia by the Department of Anesthesiology at [facility]. The right groin was prepped and draped in the usual sterile fashion. Thereafter using modified Seldinger technique, transfemoral access into the right common femoral artery was obtained without difficulty. Over an 0.035 inch guidewire an 8 French 15 cm sheath was inserted. Through this, and also over an 0.035 inch guidewire a combination of a Berenstein 125 cm support catheter inside of a 95 cm 087 balloon guide was advanced to the aortic arch region and selectively positioned in the left common carotid artery initially and then in the distal left internal carotid artery. FINDINGS: The left common carotid arteriogram demonstrates the left external carotid artery and its major branches to be widely patent. The left internal carotid artery at the bulb to the cranial skull base is widely patent. The petrous, the cavernous and the supraclinoid ICA are patent. The left anterior cerebral artery opacifies into the capillary and venous phases. Prompt cross-filling via the anterior communicating artery of the right anterior cerebral artery A2 segment and distally is noted. The left middle cerebral artery proximally demonstrates a moderate stenosis. The inferior division is widely patent. The superior division at its origin and extending into the distal M3 region demonstrates an area of irregularity associated with a significant stenosis. There is also a small stump like configuration arising from the more proximal portion of the superior division  projecting posteriorly. PROCEDURE: Through the balloon guide catheter now in the distal left ICA, a combination of a 045 Zoom aspiration catheter with a 160 cm 021 microcatheter was advanced using a standard 014 micro guidewire with a moderate J configuration to the distal M1 segment. The microcatheter was then advanced without difficulty into the superior division distal M3 segment followed by the microcatheter. The guidewire was removed. Brisk aspiration was obtained from the hub of the microcatheter which was then connected to continuous heparinized saline infusion. A 3 mm x 40 mm Solitaire X retrieval device was then advanced to the distal end of the microcatheter and deployed such  that the proximal portion of the device was just proximal to the origin of the inferior division. The 045 Zoom aspiration catheter was then advanced to engage the proximal portion of the device. Contact aspiration was then applied at the hub of the Zoom aspiration catheter and at the hub of the balloon guide catheter for approximately a minute and a half. Thereafter, the combination of the retrieval device, the micro guidewire and the microcatheter were retrieved and removed. A control arteriogram performed through the balloon guide catheter demonstrated patency of the inferior division. There was progressive narrowing with the previously noted filling defects at the proximal aspect of the superior division. Four aliquots of 25 mcg of nitroglycerin, were then infused intra-arterially with a modest improvement in the caliber of the proximal superior division with minimal relief. With modest improvement in the caliber of the superior division just distal to its origin, a faint patency was evident of a side branch arising from the proximal superior division projecting posteriorly. This demonstrated a filling defect within it with slow antegrade flow distal to this. The combination of the 045 Zoom aspiration catheter with a 021  microcatheter was then advanced to the distal left M1 segment over an 014 inch micro guidewire with a moderate J configuration. Using a torque device access into the partially recanalized branch was obtained with the micro guidewire followed by the microcatheter. The guidewire was removed. Good aspiration obtained from the hub of the microcatheter. A control arteriogram performed through the microcatheter demonstrated prominent vessel distal to this. This was then connected to continuous heparinized saline infusion. A 4 mm x 40 mm Solitaire X retrieval device was then advanced to the distal end of the microcatheter. This was deployed in the usual manner such that the proximal portion of the device was just proximal to the origin of the superior division. Constant aspiration was then applied through the Zoom aspiration catheter which has been advanced to the origin of the superior division for a minute and a half. Thereafter, with proximal flow arrest, and constant aspiration applied at the hub of the Zoom aspiration catheter and the balloon guide catheter, the combination was retrieved and removed. A control arteriogram performed through the balloon guide catheter demonstrated complete occlusion of the superior division with the stent at its origin. 2 aliquots of nitroglycerin and 2.5 mg of verapamil were then infused intra-arterially. A third pass was now made using a Vecta 46 aspiration catheter which was advanced with the catheter over an 014 inch soft tip Aristotle micro guidewire. The micro guidewire was advanced through the occluded superior division into the M2 M3 junction followed by the microcatheter. The guidewire was removed. Good aspiration obtained from the hub of the microcatheter. The Philipp Ovens was then advanced to engage just distal to the origin of the superior division. The microcatheter and micro guidewire were removed. Following aspiration for a minute and a half, the Vecta catheter was removed. A  control arteriogram performed through the balloon guide catheter demonstrated revascularization of the superior division including the first branch projecting posteriorly. An irregular filling defect was evident at the origin of the initial branch and distal to this. A TICI 3 revascularization was achieved. A control arteriogram performed 2-3 minutes later demonstrated complete reocclusion of the superior division just distal to the first branch from it. A fourth pass was made again with a Vecta 46 aspiration catheter again advanced over the micro guidewire and a microcatheter as described above the distal end of the left M1  segment. Access through the occluded superior division was obtained with the micro guidewire followed by the microcatheter followed by advancement of the Vecta 46 aspiration catheter just distal to the origin of the superior division. Aspiration was then performed at the hub of the Vecta 46 aspiration catheter for approximately a minute and a half after removal of the microcatheter and the micro guidewire. A control arteriogram performed through the balloon guide catheter now demonstrated a complete revascularization of the superior division with the exception of the first branch from the proximal portion of the superior division achieving a TICI 2C revascularization. Worsening spasm in the left M1 segment was also noted. This responded to 25 mcg of nitroglycerin intra-arterially. It was decided to stop at this juncture. A final control arteriogram performed through the balloon guide catheter in the left common carotid artery demonstrate patency of the internal carotid artery extra cranially and intracranially with a stable TICI 2C revascularization of the left MCA distribution. An 8 French Angio-Seal closure device was then deployed at the groin puncture site for hemostasis. Distal pulses remained stable unchanged from prior to the procedure. A flat panel CT of the brain demonstrated no  evidence of intracranial hemorrhage or mass effect. Patient was extubated. Pupils were a 5-6 mm equals left sluggishly reactive. Patient moved his left side spontaneously but followed no commands or showed movement in the right arm and leg. He was the transferred to the MICU for post revascularization care. Medications utilized aspirin 81 mg and Brilinta 90 mg via an orogastric tube early in anticipation of placement of a stent. IMPRESSION: Status post revascularization of occluded superior division of the left middle cerebral artery mid M2 segment, with 1 pass with a 3 mm x 20 mm Solitaire X retrieval device and contact aspiration, and 1 pass with a 4 mm x 40 mm Solitaire X retrieval device and contact aspiration, and 2 passes with the direct contact aspiration achieving a TICI 2C revascularization. PLAN: As per referring MD. Electronically Signed   By: Julieanne Cotton M.D.   On: 03/23/2023 11:06   DG CHEST PORT 1 VIEW  Result Date: 03/23/2023 CLINICAL DATA:  100030 Leukocytosis 100030 EXAM: PORTABLE CHEST - 1 VIEW COMPARISON:  CT 08/14/2022 FINDINGS: focal airspace opacity laterally at the right lung base. Relatively low lung volumes with some crowding of bronchovascular structures particularly in the lung bases. Heart size and mediastinal contours are within normal limits. No effusion. Visualized bones unremarkable. IMPRESSION: Focal right lower lobe airspace disease. Electronically Signed   By: Corlis Leak M.D.   On: 03/23/2023 10:03   ECHOCARDIOGRAM COMPLETE  Result Date: 03/22/2023    ECHOCARDIOGRAM REPORT   Patient Name:   Brian Rios Date of Exam: 03/22/2023 Medical Rec #:  [id number]           Height:       70.0 in Accession #:    [id number]          Weight:       218.9 lb Date of Birth:  12-23-1980          BSA:          2.169 m Patient Age:    41 years            BP:           146/81 mmHg Patient Gender: M                   HR:  95 bpm. Exam Location:  Inpatient Procedure: 2D  Echo, Cardiac Doppler and Color Doppler Indications:    Stroke I63.9  History:        Patient has prior history of Echocardiogram examinations, most                 recent 07/18/2020. Stroke; Risk Factors:Hypertension and Current                 Smoker.  Sonographer:    Dondra Prader RVT RCS Referring Phys: [phone] ERIC LINDZEN  Sonographer Comments: Image acquisition challenging due to uncooperative patient. IMPRESSIONS  1. Left ventricular ejection fraction, by estimation, is 60 to 65%. The left ventricle has normal function. The left ventricle has no regional wall motion abnormalities. Left ventricular diastolic parameters were normal.  2. Right ventricular systolic function is normal. The right ventricular size is normal. There is normal pulmonary artery systolic pressure.  3. The mitral valve is normal in structure. No evidence of mitral valve regurgitation. No evidence of mitral stenosis.  4. The aortic valve is tricuspid. Aortic valve regurgitation is not visualized. No aortic stenosis is present.  5. The inferior vena cava is normal in size with greater than 50% respiratory variability, suggesting right atrial pressure of 3 mmHg. FINDINGS  Left Ventricle: Left ventricular ejection fraction, by estimation, is 60 to 65%. The left ventricle has normal function. The left ventricle has no regional wall motion abnormalities. The left ventricular internal cavity size was normal in size. There is  no left ventricular hypertrophy. Left ventricular diastolic parameters were normal. Indeterminate filling pressures. Right Ventricle: The right ventricular size is normal. No increase in right ventricular wall thickness. Right ventricular systolic function is normal. There is normal pulmonary artery systolic pressure. The tricuspid regurgitant velocity is 1.80 m/s, and  with an assumed right atrial pressure of 3 mmHg, the estimated right ventricular systolic pressure is 16.0 mmHg. Left Atrium: Left atrial size was normal in  size. Right Atrium: Right atrial size was normal in size. Pericardium: There is no evidence of pericardial effusion. Mitral Valve: The mitral valve is normal in structure. No evidence of mitral valve regurgitation. No evidence of mitral valve stenosis. Tricuspid Valve: The tricuspid valve is normal in structure. Tricuspid valve regurgitation is trivial. No evidence of tricuspid stenosis. Aortic Valve: The aortic valve is tricuspid. Aortic valve regurgitation is not visualized. No aortic stenosis is present. Aortic valve mean gradient measures 6.0 mmHg. Aortic valve peak gradient measures 12.1 mmHg. Aortic valve area, by VTI measures 2.33  cm. Pulmonic Valve: The pulmonic valve was normal in structure. Pulmonic valve regurgitation is not visualized. No evidence of pulmonic stenosis. Aorta: The aortic root is normal in size and structure. Venous: The inferior vena cava is normal in size with greater than 50% respiratory variability, suggesting right atrial pressure of 3 mmHg. IAS/Shunts: No atrial level shunt detected by color flow Doppler.  LEFT VENTRICLE PLAX 2D LVIDd:         4.00 cm   Diastology LVIDs:         2.90 cm   LV e' medial:    8.38 cm/s LV PW:         1.10 cm   LV E/e' medial:  10.9 LV IVS:        0.90 cm   LV e' lateral:   14.10 cm/s LVOT diam:     1.90 cm   LV E/e' lateral: 6.5 LV SV:  65 LV SV Index:   30 LVOT Area:     2.84 cm  RIGHT VENTRICLE             IVC RV Basal diam:  3.80 cm     IVC diam: 1.60 cm RV S prime:     12.60 cm/s TAPSE (M-mode): 2.4 cm LEFT ATRIUM             Index        RIGHT ATRIUM           Index LA diam:        2.90 cm 1.34 cm/m   RA Area:     18.80 cm LA Vol (A2C):   30.9 ml 14.25 ml/m  RA Volume:   59.50 ml  27.43 ml/m LA Vol (A4C):   39.6 ml 18.26 ml/m LA Biplane Vol: 37.3 ml 17.20 ml/m  AORTIC VALVE                     PULMONIC VALVE AV Area (Vmax):    2.53 cm      PV Vmax:       1.00 m/s AV Area (Vmean):   2.39 cm      PV Peak grad:  4.0 mmHg AV Area  (VTI):     2.33 cm AV Vmax:           174.00 cm/s AV Vmean:          114.000 cm/s AV VTI:            0.278 m AV Peak Grad:      12.1 mmHg AV Mean Grad:      6.0 mmHg LVOT Vmax:         155.00 cm/s LVOT Vmean:        95.900 cm/s LVOT VTI:          0.228 m LVOT/AV VTI ratio: 0.82  AORTA Ao Root diam: 2.80 cm Ao Asc diam:  2.50 cm MITRAL VALVE               TRICUSPID VALVE MV Area (PHT): 4.19 cm    TR Peak grad:   13.0 mmHg MV Decel Time: 181 msec    TR Vmax:        180.00 cm/s MV E velocity: 91.70 cm/s MV A velocity: 70.50 cm/s  SHUNTS MV E/A ratio:  1.30        Systemic VTI:  0.23 m                            Systemic Diam: 1.90 cm Chilton Si MD Electronically signed by Chilton Si MD Signature Date/Time: 03/22/2023/4:04:21 PM    Final    MR ANGIO HEAD WO CONTRAST  Result Date: 03/22/2023 CLINICAL DATA:  Stroke, follow-up. Status post T key 2 revascularization of an inferior left M2 branch. EXAM: MRA HEAD WITHOUT CONTRAST TECHNIQUE: Angiographic images of the Circle of Willis were acquired using MRA technique without intravenous contrast. COMPARISON:  Soft tissues the neck are otherwise unremarkable. Salivary glands are within normal limits. Thyroid is normal. No significant adenopathy is present. No focal mucosal or submucosal lesions are present. FINDINGS: Anterior circulation: The internal carotid arteries are within normal limits through the ICA termini. Left A1 segment is dominant. The anterior communicating artery is patent and both ACA vessels are fed from the left. The right A1 segment is aplastic. The right M1 segment is normal. The  MCA bifurcation is normal. Right MCA branch vessels are within normal limits. Left M1 segment is unremarkable. The previously occluded M2 segment has again occluded. The posterior M2 segment is patent. Posterior circulation: The dural sinuses are patent. The straight sinus and deep cerebral veins are intact. Cortical veins are within normal limits. The right  vertebral artery is dominant. PICA origins are visualized and normal. The vertebrobasilar junction and basilar artery are normal. Both superior cerebellar arteries are within normal limits. Posterior cerebral arteries originate from the basilar tip. The PCA branch vessels are within normal limits bilaterally. Anatomic variants: None Other: None. IMPRESSION: 1. The previously occluded left M2 segment has again occluded. This corresponds with the area of expanded infarct. 2. Otherwise normal MRA circle-of-Willis without significant proximal stenosis, aneurysm, or branch vessel occlusion. 3. No evidence for dural sinus thrombosis. Electronically Signed   By: Marin Roberts M.D.   On: 03/22/2023 14:31   MR BRAIN WO CONTRAST  Result Date: 03/22/2023 CLINICAL DATA:  Stroke, follow-up. Status post TICI2 revascularization of occluded inferior left M2 branch. EXAM: MRI HEAD WITHOUT CONTRAST TECHNIQUE: Multiplanar, multiecho pulse sequences of the brain and surrounding structures were obtained without intravenous contrast. COMPARISON:  MR head without contrast 03/21/2023. FINDINGS: Brain: The previously seen infarct has increased in size. The infarcts now encompasses the majority of the insular cortex in the posterior left frontal operculum more confluent infarct extends into the high posterior left parietal lobe. T2 and FLAIR signal hyperintensity is associated with the areas of acute/subacute infarct. No acute hemorrhage is present. Some swelling of the cortex is noted without significant mass effect or midline shift. The ventricles are of normal size. No significant extraaxial fluid collection is present. The brainstem and cerebellum are within normal limits. The internal auditory canals are within normal limits. Midline structures are within normal limits. Vascular: Flow is present in the major intracranial arteries. Skull and upper cervical spine: The craniocervical junction is normal. Upper cervical spine is  within normal limits. Marrow signal is unremarkable. Sinuses/Orbits: Moderate mucosal thickening is present the right maxillary sinus. Mild mucosal thickening is present in the anterior right ethmoid air cells and inferior right frontal sinus. No fluid levels are present. The paranasal sinuses and mastoid air cells are otherwise clear. The globes and orbits are within normal limits. IMPRESSION: 1. Interval increase in size of acute/subacute nonhemorrhagic infarct involving the majority of the left insular cortex and posterior left frontal operculum extending into the high posterior left parietal lobe. 2. No acute hemorrhage or significant mass effect or midline shift. 3. Right-sided sinus disease. Electronically Signed   By: Marin Roberts M.D.   On: 03/22/2023 14:17   MR BRAIN WO CONTRAST  Result Date: 03/21/2023 CLINICAL DATA:  Neuro deficit, acute, stroke suspected. Right-sided weakness and facial droop. EXAM: MRI HEAD WITHOUT CONTRAST TECHNIQUE: Multiplanar, multiecho pulse sequences of the brain and surrounding structures were obtained without intravenous contrast. COMPARISON:  Head CT and CTA 03/21/2023.  Head MRI 07/17/2020. FINDINGS: The examination was discontinued at the direction of the attending neurologist prior to obtaining a sagittal T1 sequence due to the patient's condition. Brain: There are patchy acute cortical and subcortical infarcts in the left MCA territory involving the frontal and parietal lobes with corresponding mild FLAIR hyperintensity. 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 There is ex vacuo dilatation  of the frontal horn of the left lateral ventricle. Small T2 hyperintensities scattered throughout the cerebral white matter elsewhere bilaterally are stable to slightly increased compared to the prior  MRI and are moderately advanced for age. No mass, midline shift, or extra-axial fluid collection is evident. Vascular: Major intracranial vascular flow voids are preserved. Skull and upper cervical spine: No suspicious marrow lesion. Sinuses/Orbits: Unremarkable orbits. Mild mucosal thickening in the paranasal sinuses. Clear mastoid air cells. Other: None. IMPRESSION: 1. Patchy acute left MCA infarcts superimposed on chronic infarcts. 2. Moderately age advanced cerebral white matter T2 signal changes, nonspecific though may reflect chronic small vessel ischemia, migraines, vasculitis, sequelae of previous infection/inflammation, or demyelinating disease. Electronically Signed   By: Sebastian Ache M.D.   On: 03/21/2023 16:22   CT ANGIO HEAD NECK W WO CM W PERF (CODE STROKE)  Result Date: 03/21/2023 CLINICAL DATA:  Neuro deficit, acute, stroke suspected. Right-sided weakness and facial droop. EXAM: CT ANGIOGRAPHY HEAD AND NECK CT PERFUSION BRAIN TECHNIQUE: Multidetector CT imaging of the head and neck was performed using the standard protocol during bolus administration of intravenous contrast. Multiplanar CT image reconstructions and MIPs were obtained to evaluate the vascular anatomy. Carotid stenosis measurements (when applicable) are obtained utilizing NASCET criteria, using the distal internal carotid diameter as the denominator. Multiphase CT imaging of the brain was performed following IV bolus contrast injection. Subsequent parametric perfusion maps were calculated using RAPID software. RADIATION DOSE REDUCTION: This exam was performed according to the departmental dose-optimization program which includes automated exposure control, adjustment of the mA and/or kV according to patient size and/or use of iterative reconstruction technique. CONTRAST:  OMNIPAQUE IOHEXOL 350 MG/ML SOLN COMPARISON:  Head and neck MRA 07/17/2020 FINDINGS: CTA NECK FINDINGS Aortic arch: Standard 3 vessel aortic arch with  widely patent arch vessel origins. Right carotid system: Patent without evidence of stenosis or dissection. Tortuous distal cervical ICA. Left carotid system: Patent without evidence of stenosis or dissection. Vertebral arteries: Patent with the right being moderately dominant. Limited assessment of the left V1 segment due to venous contrast. No evidence of a significant stenosis or dissection elsewhere in the neck. Skeleton: No acute osseous abnormality or suspicious osseous lesion. Other neck: No evidence of cervical lymphadenopathy or mass. Upper chest: No mass or consolidation in the included lung apices. Review of the MIP images confirms the above findings CTA HEAD FINDINGS Anterior circulation: The internal carotid arteries are widely patent from skull base to carotid termini. The left M1 segment is patent with a mild stenosis proximally. When comparing with the 2021 MRA, there is new occlusion of 2 separate proximal left M2 branches. The right MCA and both ACAs are patent without evidence of a significant proximal stenosis. The right A1 segment is absent. No aneurysm is identified. Posterior circulation: The intracranial vertebral arteries are patent to the basilar. Patent PICA and SCA origins are visualized bilaterally. The basilar artery is widely patent. Posterior communicating arteries are diminutive or absent. Both PCAs are patent without evidence of a significant proximal stenosis. No aneurysm is identified. Venous sinuses: Patent. Anatomic variants: Absent right A1 segment. Review of the MIP images confirms the above findings CT Brain Perfusion Findings: Failed processing due to either mistimed or inadequate bolus which could not be manually corrected. These results were communicated to Dr. Iver Nestle at 3:50 pm on 03/21/2023 by text page via the [facility] messaging system. IMPRESSION: 1. 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 2. Mild left M1 stenosis. 3. Widely patent carotid  arteries. 4. Nondiagnostic CTP.  Electronically Signed   By: Sebastian Ache M.D.   On: 03/21/2023 16:13   CT HEAD CODE STROKE WO CONTRAST  Result Date: 03/21/2023 CLINICAL DATA:  Code stroke.  Right-sided weakness and facial droop EXAM: CT HEAD WITHOUT CONTRAST TECHNIQUE: Contiguous axial images were obtained from the base of the skull through the vertex without intravenous contrast. RADIATION DOSE REDUCTION: This exam was performed according to the departmental dose-optimization program which includes automated exposure control, adjustment of the mA and/or kV according to patient size and/or use of iterative reconstruction technique. COMPARISON:  CT Head 07/17/20 FINDINGS: Brain: Possible region of loss of gray-white differentiation in the left parietal lobe (series 3, image 26). No hemorrhage. No extra-axial fluid collection. No extra-axial fluid collection. Vascular: No hyperdense vessel or unexpected calcification. Skull: Normal. Negative for fracture or focal lesion. Sinuses/Orbits: No middle ear or mastoid effusion. Frothy secretions in the right maxillary sinus, which can be seen in the setting of acute sinusitis. Orbits are unremarkable. Other: None. ASPECTS [facility] Stroke Program Early CT Score) - Ganglionic level infarction (caudate, lentiform nuclei, internal capsule, insula, M1-M3 cortex): 7 - Supraganglionic infarction (M4-M6 cortex): 2 Total score (0-10 with 10 being normal): 9 IMPRESSION: Findings are worrisome for an acute infarct in the posterior left parietal lobe. Aspects is 9. No hemorrhage. Findings were paged to Dr. Iver Nestle on 03/21/23 at 3:29 PM via [facility] paging system. Electronically Signed   By: Lorenza Cambridge M.D.   On: 03/21/2023 15:31       HISTORY OF PRESENT ILLNESS 42 year old patient with history of depression, EtOH abuse, polysubstance abuse, embolic stroke in 2021, hyperlipidemia, schizophrenia and tobacco abuse presented with acute onset right-sided weakness, right facial droop and dysarthria.  HOSPITAL  COURSE Patient was found to be having an acute stroke with occlusion of 2 proximal left M2 branches and was taken to IR for mechanical thrombectomy. TICI 2C revascularization was achieved, but MRI revealed large left MCA stroke.  He is now stable and ready to be discharged to CIR.  Stroke:  left MCA territory stroke due to left M2 occlusion s/p IR with TICI2c but reoccluded, likely large vessel disease from uncontrolled risk factors and cocaine use Code Stroke CT head likely acute infarct in posterior left parietal lobe ASPECTS 9 CTA head & neck occlusion of 2 left proximal M2 branches, left M1 stenosis MRI 5/30: Patchy acute left MCA infarct superimposed on chronic infarct S/p IR with left M2 TICI2c reperfusion MRI 5/31 interval increase in size of the acute infarct involving the majority of the left and insular cortex and posterior left frontal operculum extending into high posterior left parietal lobe MRA previously occluded left M2 segment has again occluded  2D Echo EF 60-65% LDL 54 HgbA1c 5.7 UDS positive for cocaine and THC VTE prophylaxis - lovenox aspirin 325 mg daily prior to admission, now on aspirin 81 mg daily and Brilinta (ticagrelor) 90 mg bid.  Therapy recommendations: CIR    Hx of stroke 06/2020 left BG, left MCA, MCA/PCA and left MCA/ACA scattered infarcts, MRA head and neck negative, TEE neg, EF 50-55%, no PFO, UDS neg, LDL 200 and A1C 6.0, discharged on DAPT and lipitor 80   Hypertension Home meds: None Stable BP < 180/105 Long-term BP goal normotensive   Hyperlipidemia Home meds: Atorvastatin 40 mg daily, resumed in hospital LDL 54, goal < 70 Continue statin at discharge   Fever Leukocytosis Tmax 100.7->afebrile-> continue low-grade fevers WBC 13.4->14.7 Urinalysis negative Chest x-ray reveals  right lower lobe pneumonia Start Zosyn per pharmacy   Dysphagia  Passed swallow, on nectar thick liquid Gentle hydration  Avoid aspiration   Cocaine abuse UDS  positive for cocaine Cessation education will be provided   Tobacco abuse Current smoker Smoking cessation counseling will be provided   Other Stroke Risk Factors ETOH use, alcohol level <10, advised to drink no more than 1-2 drink(s) a day THC abuse - UDS:  THC POSITIVE Patient will be advised to stop using due to stroke risk.   Other Active Problems Schizophrenia-continue home Abilify and Ingrezza Hypokalemia K 3.4 , supplement  DISCHARGE EXAM Blood pressure 126/76, pulse 99, temperature 98.4 F (36.9 C), temperature source Oral, resp. rate 18, SpO2 99 %. General: Alert, well-nourished, well-developed middle-aged African-American male in no acute distress Respiratory: Regular, unlabored respirations Neurological: Awake alert and interactive.  Severe expressive aphasia with word finding difficulties and word hesitancy.  Pupils equal round and reactive, but disconjugate gaze, OS palsy, with inability  to cross midline.  He is able to intermittently follow commands.  Patient  is able to repeat  occasional simple simple words.  He is unable to name objects and can only say "yes", "no" or "all right".  His receptive aphasia appears to be better today with patient able to understand more of what is spoken to him and able to point to different pictures when asked.  Severe Right facial droop noted, 5 out of 5 strength in left upper extremity and left lower extremity, 3 out of 5 strength in upper right upper extremity  Discharge Diet      Diet   DIET DYS 3 Fluid consistency: Thin   liquids  DISCHARGE PLAN Disposition:  Transfer to [facility] for ongoing PT, OT and ST aspirin 81 mg daily and Brilinta (ticagrelor) 90 mg bid for secondary stroke prevention Recommend ongoing stroke risk factor control by Primary Care Physician at time of discharge from inpatient rehabilitation. Follow-up PCP Default, Provider, MD in 2 weeks following discharge from rehab. Follow-up in [facility] in 8 weeks with Dr. Pearlean Brownie following discharge from rehab, office to schedule an appointment.   34 minutes were spent preparing discharge.  Patient seen and examined by NP/APP with MD. MD to update note as needed.   Elmer Picker, DNP, FNP-BC [facility] Pager: [phone]  I have personally obtained history,examined this patient, reviewed notes, independently viewed imaging studies, participated in medical decision making and plan of care.ROS completed by me personally and pertinent positives fully documented  I have made any additions or clarifications directly to the above note. Agree with note above.    Delia Heady, MD Medical Director [facility] Pager: [phone] 03/27/2023 2:22 PM

## 2023-03-23 NOTE — Progress Notes (Signed)
Inpatient Rehab Admissions Coordinator Note:   Per therapy patient was screened for CIR candidacy by Edson Deridder Luvenia Starch, CCC-SLP. At this time, pt appears to be a potential candidate for CIR. I will place an order for rehab consult for full assessment, per our protocol.  Please contact me any with questions.Wolfgang Phoenix, MS, CCC-SLP Admissions Coordinator [phone] 03/23/23 6:06 PM

## 2023-03-23 NOTE — Evaluation (Signed)
Occupational Therapy Evaluation Patient Details Name: Brian Rios MRN: [id number] DOB: 08/07/1981 Today's Date: 03/23/2023   History of Present Illness Brian Rios is a 42 y.o. male who presented to the [facility] ED today with acute onset of right sided weakness, right facial droop and dysphasia; pt transported to [facility] for emergent thrombectomy. TICI 2C revascularization was achieved. MRI reveals large left MCA stroke. 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   Clinical Impression   Brian Rios was evaluated s/p the above admission list. Anticipate he is indep at baseline however pt presents with impaired communication and is unable to confirm PLOF or home set up. Upon evaluation he was limited by impaired communication, impulsivity, R hemibody sensory motor deficits, weakness, R lateral lean, impaired balance, dysconjugate gaze and decreased activity tolerance. Overall he required min A for bed mobility, min A +2 for STS at EOB and mod A +2 with HHA to progress mobility into the hallway. Due to the deficits listed below he also needs max A +2 for LB ADLs and up to mod A +2 for UB ADLs. Pt will benefit from continued acute OT services and intensive inpatient follow up therapy, >3 hours/day after discharge.        Recommendations for follow up therapy are one component of a multi-disciplinary discharge planning process, led by the attending physician.  Recommendations may be updated based on patient status, additional functional criteria and insurance authorization.   Assistance Recommended at Discharge Frequent or constant Supervision/Assistance  Patient can return home with the following A lot of help with walking and/or transfers;A lot of help with bathing/dressing/bathroom;Assistance with cooking/housework;Assistance  with feeding;Direct supervision/assist for medications management;Direct supervision/assist for financial management;Assist for transportation;Help with stairs or ramp for entrance    Functional Status Assessment  Patient has had a recent decline in their functional status and demonstrates the ability to make significant improvements in function in a reasonable and predictable amount of time.  Equipment Recommendations  Other (comment) (defer)    Recommendations for Other Services Rehab consult     Precautions / Restrictions Precautions Precautions: Fall Restrictions Weight Bearing Restrictions: No      Mobility Bed Mobility Overal bed mobility: Needs Assistance Bed Mobility: Supine to Sit     Supine to sit: Min assist     General bed mobility comments: pt came to long sitting in the bed with min G and needed min A for management of RUE during transfer    Transfers Overall transfer level: Needs assistance Equipment used: 2 person hand held assist Transfers: Sit to/from Stand Sit to Stand: Min assist, +2 safety/equipment, +2 physical assistance           General transfer comment: min A +2 to stand at EOB - mod A +2 to mobilize with HHA      Balance Overall balance assessment: Needs assistance Sitting-balance support: Feet supported Sitting balance-Leahy Scale: Fair Sitting balance - Comments: R lateral lean Postural control: Right lateral lean Standing balance support: Bilateral upper extremity supported, During functional activity Standing balance-Leahy Scale: Poor Standing balance comment: R lateral lean min/mod to correct                           ADL either performed or assessed with clinical judgement   ADL Overall ADL's : Needs assistance/impaired Eating/Feeding: Maximal assistance;Sitting   Grooming: Moderate assistance;Sitting  Upper Body Bathing: Moderate assistance;Sitting   Lower Body Bathing: Maximal assistance;+2 for  safety/equipment;Sit to/from stand   Upper Body Dressing : Moderate assistance;Sitting   Lower Body Dressing: Maximal assistance;+2 for safety/equipment;Sit to/from stand   Toilet Transfer: Moderate assistance;+2 for physical assistance;+2 for safety/equipment;Ambulation;Regular Toilet   Toileting- Clothing Manipulation and Hygiene: Maximal assistance;Sit to/from stand;+2 for safety/equipment       Functional mobility during ADLs: Moderate assistance;+2 for safety/equipment General ADL Comments: limited by R weakness, communication, safety, L inattention     Vision Patient Visual Report: Other (comment) Vision Assessment?: Vision impaired- to be further tested in functional context Additional Comments: dyconjugate L eye, tracks in all fields with R eye. Unsure of visual baseline. Noted L inattention/ R gaze throughout     Perception Perception Perception Tested?: No   Praxis Praxis Praxis tested?: Not tested    Pertinent Vitals/Pain Pain Assessment Pain Assessment: No/denies pain Pain Location: shaking head no when asked about pain Pain Intervention(s): Limited activity within patient's tolerance, Monitored during session     Hand Dominance     Extremity/Trunk Assessment Upper Extremity Assessment Upper Extremity Assessment: RUE deficits/detail RUE Deficits / Details: moving against gravity proximally, no activation at wrist or hand. shoulder and elbow MMT 2/5, full PROM. edematous. reponds to noxious stim RUE Sensation: decreased light touch;decreased proprioception RUE Coordination: decreased fine motor;decreased gross motor   Lower Extremity Assessment Lower Extremity Assessment: Defer to PT evaluation   Cervical / Trunk Assessment Cervical / Trunk Assessment: Other exceptions Cervical / Trunk Exceptions: R lateral lean   Communication Communication Communication: Receptive difficulties;Expressive difficulties   Cognition Arousal/Alertness:  Awake/alert Behavior During Therapy: Impulsive Overall Cognitive Status: Difficult to assess                                 General Comments: follows most simple commands, more difficulty with multistep directions. Pt attempting to communicate with head nodds and "yes/no" verbalizations, unsure of accuracy. Noted impulsivity and limited safety awareness throughout     General Comments  VSS on RA, elevated BP but SBP dropping with activity     Home Living Family/patient expects to be discharged to:: Private residence                                 Additional Comments: pt presents with impiared communication      Prior Functioning/Environment Prior Level of Function : Independent/Modified Independent;Patient poor historian/Family not available             Mobility Comments: anticipate no AD ADLs Comments: anticipate indep        OT Problem List: Decreased strength;Decreased range of motion;Decreased activity tolerance;Impaired balance (sitting and/or standing);Decreased cognition;Decreased safety awareness;Decreased knowledge of use of DME or AE;Pain;Impaired UE functional use;Impaired sensation      OT Treatment/Interventions: Self-care/ADL training;Therapeutic exercise;Neuromuscular education;DME and/or AE instruction;Therapeutic activities;Patient/family education;Balance training    OT Goals(Current goals can be found in the care plan section) Acute Rehab OT Goals Patient Stated Goal: unable to state OT Goal Formulation: With patient Time For Goal Achievement: 04/06/23 Potential to Achieve Goals: Good ADL Goals Pt Will Perform Grooming: with set-up;sitting Pt Will Perform Upper Body Dressing: with set-up;sitting Pt Will Perform Lower Body Dressing: with min assist;sit to/from stand Pt Will Transfer to Toilet: with min assist;ambulating;regular height toilet Additional ADL Goal #1: Pt will follow 2 step commands  100% of the time to  complete ADL task  OT Frequency: Min 2X/week    Co-evaluation PT/OT/SLP Co-Evaluation/Treatment: Yes Reason for Co-Treatment: Complexity of the patient's impairments (multi-system involvement);For patient/therapist safety;To address functional/ADL transfers   OT goals addressed during session: ADL's and self-care;Strengthening/ROM      AM-PAC OT "6 Clicks" Daily Activity     Outcome Measure Help from another person eating meals?: A Little Help from another person taking care of personal grooming?: A Lot Help from another person toileting, which includes using toliet, bedpan, or urinal?: A Lot Help from another person bathing (including washing, rinsing, drying)?: A Lot Help from another person to put on and taking off regular upper body clothing?: A Lot Help from another person to put on and taking off regular lower body clothing?: A Lot 6 Click Score: 13   End of Session Equipment Utilized During Treatment: Gait belt Nurse Communication: Mobility status  Activity Tolerance: Patient tolerated treatment well Patient left: in chair;with call bell/phone within reach;with chair alarm set  OT Visit Diagnosis: Unsteadiness on feet (R26.81);Other abnormalities of gait and mobility (R26.89);Muscle weakness (generalized) (M62.81);Hemiplegia and hemiparesis;Pain Hemiplegia - Right/Left: Right Hemiplegia - caused by: Cerebral infarction                Time: 4098-1191 OT Time Calculation (min): 31 min Charges:  OT General Charges $OT Visit: 1 Visit OT Evaluation $OT Eval Moderate Complexity: 1 Mod  Derenda Mis, OTR/L Acute Rehabilitation Services Office [phone] Secure Chat Communication Preferred   Donia Pounds 03/23/2023, 10:24 AM

## 2023-03-23 NOTE — Evaluation (Signed)
Physical Therapy Evaluation Patient Details Name: Brian Rios MRN: [id number] DOB: 08-06-1981 Today's Date: 03/23/2023  History of Present Illness  Brian Rios is a 42 y.o. male who presented to the [facility] ED today with acute onset of right sided weakness, right facial droop and dysphasia; pt transported to [facility] for emergent thrombectomy. TICI 2C revascularization was achieved. MRI reveals large left MCA stroke. 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   Clinical Impression  Brian Rios is 42 y.o. male admitted with above HPI and diagnosis. Patient is currently limited by functional impairments below (see PT problem list). Patient limited by impaired communication but likely independent at baseline and nodding yes at times when asked this. Currently her requires Min assist for bed mobility with slight posterior and Rt lean in sitting EOB requiring min assist to stabilize balance. Mod Assist +2 required for ambulation with mod assist facilitate weight shift for step advancement and to prevent Rt lean/drift. Patient will benefit from continued skilled PT interventions to address impairments and progress independence with mobility, recommending intense follow up rehab >3 hours/day to maximize regain of functional mobility and independence. Acute PT will follow and progress as able.        Recommendations for follow up therapy are one component of a multi-disciplinary discharge planning process, led by the attending physician.  Recommendations may be updated based on patient status, additional functional criteria and insurance authorization.  Follow Up Recommendations       Assistance Recommended at Discharge Frequent or constant Supervision/Assistance  Patient can return home with the following  Two people to  help with walking and/or transfers;A lot of help with bathing/dressing/bathroom;Assistance with cooking/housework;Direct supervision/assist for medications management;Assist for transportation;Help with stairs or ramp for entrance    Equipment Recommendations  (TBD at next venue)  Recommendations for Other Services  Rehab consult    Functional Status Assessment Patient has had a recent decline in their functional status and demonstrates the ability to make significant improvements in function in a reasonable and predictable amount of time.     Precautions / Restrictions Precautions Precautions: Fall Restrictions Weight Bearing Restrictions: No      Mobility  Bed Mobility Overal bed mobility: Needs Assistance Bed Mobility: Supine to Sit     Supine to sit: Min assist     General bed mobility comments: pt came to long sitting in the bed with min G and needed min A for management of RUE during transfer and for support posterior with turn to scoot EOB. Assist to prevent Rt lateral lean.    Transfers Overall transfer level: Needs assistance Equipment used: 2 person hand held assist Transfers: Sit to/from Stand Sit to Stand: Min assist, +2 safety/equipment, +2 physical assistance           General transfer comment: min A +2 to stand at EOB for rise, slightly Rt lean in standing, pt able to correct with verbal cues.    Ambulation/Gait Ambulation/Gait assistance: Mod assist, +2 safety/equipment, +2 physical assistance Gait Distance (Feet): 50 Feet Assistive device: 2 person hand held assist Gait Pattern/deviations: Step-through pattern, Decreased stance time - right, Decreased dorsiflexion - right, Decreased weight shift to right, Decreased weight shift to left, Knee flexed in stance - right, Knees buckling Gait velocity: decr     General Gait Details: Mod assist required to weight shift Lt to faciliate stepping Rt LE forward and pt  with decreased stance time on Rt and slight  buckling as pt fatigued. Mod+2 to steady balance throughout gait. chair follow for safety. BP stable during gait.  Stairs            Wheelchair Mobility    Modified Rankin (Stroke Patients Only) Modified Rankin (Stroke Patients Only) Pre-Morbid Rankin Score: No symptoms Modified Rankin: Moderately severe disability     Balance Overall balance assessment: Needs assistance Sitting-balance support: Feet supported Sitting balance-Leahy Scale: Fair Sitting balance - Comments: R lateral lean Postural control: Right lateral lean Standing balance support: Bilateral upper extremity supported, During functional activity Standing balance-Leahy Scale: Poor Standing balance comment: R lateral lean mod to correct                             Pertinent Vitals/Pain Pain Assessment Pain Assessment: No/denies pain Facial Expression: Relaxed, neutral Body Movements: Absence of movements Muscle Tension: Relaxed Compliance with ventilator (intubated pts.): N/A Vocalization (extubated pts.): Talking in normal tone or no sound CPOT Total: 0 Pain Location: shaking head no when asked about pain Pain Intervention(s): Limited activity within patient's tolerance, Monitored during session, Repositioned    Home Living Family/patient expects to be discharged to:: Private residence                   Additional Comments: pt presents with impiared communication    Prior Function Prior Level of Function : Independent/Modified Independent;Patient poor historian/Family not available             Mobility Comments: anticipate no AD ADLs Comments: anticipate indep     Hand Dominance        Extremity/Trunk Assessment   Upper Extremity Assessment Upper Extremity Assessment: Defer to OT evaluation RUE Deficits / Details: moving against gravity proximally, no activation at wrist or hand. shoulder and elbow MMT 2/5, full PROM. edematous. reponds to noxious stim RUE Sensation:  decreased light touch;decreased proprioception RUE Coordination: decreased fine motor;decreased gross motor    Lower Extremity Assessment Lower Extremity Assessment: RLE deficits/detail;LLE deficits/detail;Difficult to assess due to impaired cognition RLE Deficits / Details: grossly 3/5 with manual cues needed to engage resistance testing for hip ext, knee flex/ext, and dorsi/plantarflexion RLE Sensation: decreased proprioception RLE Coordination: decreased fine motor;decreased gross motor LLE Deficits / Details: WFL's for strength, cognition limited pt's understanding for MMT    Cervical / Trunk Assessment Cervical / Trunk Assessment: Other exceptions Cervical / Trunk Exceptions: R lateral lean  Communication   Communication: Receptive difficulties;Expressive difficulties  Cognition Arousal/Alertness: Awake/alert Behavior During Therapy: Impulsive Overall Cognitive Status: Difficult to assess                                 General Comments: follows most simple commands, more difficulty with multistep directions. Pt attempting to communicate with head nodds and "yes/no" verbalizations, unsure of accuracy. Noted impulsivity and limited safety awareness throughout        General Comments General comments (skin integrity, edema, etc.): VSS on RA, silghtly elevated BP    Exercises     Assessment/Plan    PT Assessment Patient needs continued PT services  PT Problem List Decreased strength;Decreased activity tolerance;Decreased balance;Decreased mobility;Decreased coordination;Decreased knowledge of use of DME;Decreased cognition;Decreased safety awareness;Decreased knowledge of precautions;Obesity;Impaired sensation;Cardiopulmonary status limiting activity;Impaired tone       PT Treatment Interventions DME instruction;Gait training;Stair training;Functional mobility training;Therapeutic activities;Therapeutic exercise;Balance training;Neuromuscular  re-education;Cognitive remediation;Patient/family education;Wheelchair mobility training    PT Goals (Current goals can be found in the Care Plan section)  Acute Rehab PT Goals Patient Stated Goal: get home PT Goal Formulation: With patient Time For Goal Achievement: 04/06/23 Potential to Achieve Goals: Good    Frequency Min 4X/week     Co-evaluation PT/OT/SLP Co-Evaluation/Treatment: Yes Reason for Co-Treatment: Complexity of the patient's impairments (multi-system involvement);For patient/therapist safety;To address functional/ADL transfers PT goals addressed during session: Mobility/safety with mobility;Balance OT goals addressed during session: ADL's and self-care;Strengthening/ROM       AM-PAC PT "6 Clicks" Mobility  Outcome Measure Help needed turning from your back to your side while in a flat bed without using bedrails?: A Little Help needed moving from lying on your back to sitting on the side of a flat bed without using bedrails?: A Little Help needed moving to and from a bed to a chair (including a wheelchair)?: A Lot Help needed standing up from a chair using your arms (e.g., wheelchair or bedside chair)?: A Little Help needed to walk in hospital room?: Total Help needed climbing 3-5 steps with a railing? : Total 6 Click Score: 13    End of Session Equipment Utilized During Treatment: Gait belt Activity Tolerance: Patient tolerated treatment well Patient left: in chair;with call bell/phone within reach;with chair alarm set Nurse Communication: Mobility status PT Visit Diagnosis: Muscle weakness (generalized) (M62.81);Difficulty in walking, not elsewhere classified (R26.2);Other abnormalities of gait and mobility (R26.89);Unsteadiness on feet (R26.81);Hemiplegia and hemiparesis Hemiplegia - Right/Left: Right Hemiplegia - caused by: Cerebral infarction    Time: 1610-9604 PT Time Calculation (min) (ACUTE ONLY): 23 min   Charges:   PT Evaluation $PT Eval Moderate  Complexity: 1 Mod          Wynn Maudlin, DPT Acute Rehabilitation Services Office [phone]  03/23/23 3:48 PM

## 2023-03-23 NOTE — Progress Notes (Signed)
Pharmacy Antibiotic Note  Brian Rios is a 42 y.o. male admitted on 03/21/2023 with Stroke.  Pharmacy has been consulted for Unasyn dosing. CrCl >100 mL/min  Plan: Unasyn 3g IV q6h Pharmacy will sign off, reconsult as needed     Temp (24hrs), Avg:98.3 F (36.8 C), Min:97.7 F (36.5 C), Max:99 F (37.2 C)  Recent Labs  Lab 03/21/23 1518 03/22/23 0443 03/23/23 0404  WBC 13.4* 18.4* 14.7*  CREATININE 1.13 0.97 0.95     Estimated Creatinine Clearance: 118.4 mL/min (by C-G formula based on SCr of 0.95 mg/dL).    No Known Allergies   Thank you for allowing pharmacy to be a part of this patient's care.  Mosetta Anis, PharmD Clinical Pharmacist 03/23/2023 2:55 PM

## 2023-03-23 NOTE — Progress Notes (Signed)
Speech Language Pathology Treatment: Dysphagia  Patient Details Name: Brian Rios MRN: [id number] DOB: 03/11/1981 Today's Date: 03/23/2023 Time: 9562-1308 SLP Time Calculation (min) (ACUTE ONLY): 15 min  Assessment / Plan / Recommendation Clinical Impression  Patient seen by SLP for skilled treatment focused on dysphagia goals. Patient was awake, alert and receptive to having some PO's. Per RN, he is doing better overall today as compared to yesterday and although he is still having some coughing, this does not appear solely due to PO intake. SLP observed patient with cup sips of nectar thick juice. He was able to feed self, is impulsive, taking large sips and with moderate amount of anterior spillage on right side of mouth with no awareness. He did not exhibit any overt s/s aspiration with nectar thick liquids but with trials of ice chips, he exhibited immediate cough (non productive). Patient seemed to try to communicate something with SLP  as he sat up in bed, gesturing to right side of room, saying "go". He shook head when SLP asked yes/no questions to try to confirm what his intent but he shook head each time and seemed to get frustrated. SLP will continue to follow for dysphagia and aphasia goals.    HPI HPI: 42 year old male presenting with new onset of right sided weakness, right facial droop and dysphasia secondary to occlusion of two proximal left M2 branches. Patchy acute left MCA infarcts superimposed on chronic infarcts.      SLP Plan  Continue with current plan of care      Recommendations for follow up therapy are one component of a multi-disciplinary discharge planning process, led by the attending physician.  Recommendations may be updated based on patient status, additional functional criteria and insurance authorization.    Recommendations  Diet recommendations: Nectar-thick liquid Liquids provided via: Cup;Straw Medication Administration: Crushed with  puree Supervision: Patient able to self feed;Full supervision/cueing for compensatory strategies Compensations: Slow rate;Small sips/bites;Monitor for anterior loss Postural Changes and/or Swallow Maneuvers: Seated upright 90 degrees                  Oral care BID   Intermittent Supervision/Assistance Dysphagia, oropharyngeal phase (R13.12)     Continue with current plan of care    Angela Nevin, MA, CCC-SLP Speech Therapy

## 2023-03-24 ENCOUNTER — Inpatient Hospital Stay (HOSPITAL_COMMUNITY): Payer: Medicaid Other

## 2023-03-24 DIAGNOSIS — I63512 Cerebral infarction due to unspecified occlusion or stenosis of left middle cerebral artery: Secondary | ICD-10-CM | POA: Diagnosis not present

## 2023-03-24 LAB — GLUCOSE, CAPILLARY
Glucose-Capillary: 113 mg/dL — ABNORMAL HIGH (ref 70–99)
Glucose-Capillary: 121 mg/dL — ABNORMAL HIGH (ref 70–99)
Glucose-Capillary: 119 mg/dL — ABNORMAL HIGH (ref 70–99)
Glucose-Capillary: 100 mg/dL — ABNORMAL HIGH (ref 70–99)

## 2023-03-24 LAB — BASIC METABOLIC PANEL
Anion gap: 7 (ref 5–15)
BUN: <5 mg/dL — ABNORMAL LOW (ref 6–20)
CO2: 23 mmol/L (ref 22–32)
Calcium: 8 mg/dL — ABNORMAL LOW (ref 8.9–10.3)
Chloride: 108 mmol/L (ref 98–111)
Creatinine, Ser: 0.94 mg/dL (ref 0.61–1.24)
GFR, Estimated: >60 mL/min (ref >60)
Glucose, Bld: 99 mg/dL (ref 70–99)
Potassium: 3.6 mmol/L (ref 3.5–5.1)
Sodium: 138 mmol/L (ref 135–145)

## 2023-03-24 LAB — CBC
HCT: 37.1 % — ABNORMAL LOW (ref 39.0–52.0)
Hemoglobin: 12.2 g/dL — ABNORMAL LOW (ref 13.0–17.0)
MCH: 27.2 pg (ref 26.0–34.0)
MCHC: 32.9 g/dL (ref 30.0–36.0)
MCV: 82.8 fL (ref 80.0–100.0)
Platelets: 193 10*3/uL (ref 150–400)
RBC: 4.48 MIL/uL (ref 4.22–5.81)
RDW: 14.3 % (ref 11.5–15.5)
WBC: 12 10*3/uL — ABNORMAL HIGH (ref 4.0–10.5)
nRBC: 0 % (ref 0.0–0.2)

## 2023-03-24 LAB — C DIFFICILE QUICK SCREEN W PCR REFLEX
C Diff antigen: NEGATIVE
C Diff interpretation: NOT DETECTED
C Diff toxin: NEGATIVE

## 2023-03-24 MED ORDER — ORAL CARE MOUTH RINSE
15.0000 mL | Freq: Two times a day (BID) | OROMUCOSAL | Status: DC
  Administered 2023-03-24 – 2023-03-27 (×7): 15 mL via OROMUCOSAL

## 2023-03-24 MED ORDER — LISINOPRIL 10 MG PO TABS
10.0000 mg | ORAL_TABLET | Freq: Every day | ORAL | Status: DC
  Administered 2023-03-24 – 2023-03-27 (×4): 10 mg via ORAL
  Filled 2023-03-24 (×4): qty 1

## 2023-03-24 NOTE — Progress Notes (Signed)
SLP Note  Patient Details Name: Manbir Loschiavo MRN: [id number] DOB: December 15, 1980   Cancelled treatment:        Spoke briefly with pt and RN about plans for instrumental swallow assessment.  RN notes coughing with PO intake.  Pt declines FEES at this time.  We will proceed with MBSS planned for around 11 today.   Kerrie Pleasure, MA, CCC-SLP Acute Rehabilitation Services Office: [phone] 03/24/2023, 10:26 AM

## 2023-03-24 NOTE — Procedures (Signed)
Modified Barium Swallow Study  Patient Details  Name: Brian Rios MRN: [id number] Date of Birth: 11/07/1980  Today's Date: 03/24/2023  Modified Barium Swallow completed.  Full report located under Chart Review in the Imaging Section.  History of Present Illness 42 year old male presenting with new onset of right sided weakness, right facial droop and dysphasia secondary to occlusion of two proximal left M2 branches. Patchy acute left MCA infarcts superimposed on chronic infarcts.   Clinical Impression Pt presents with a mild oropharyngeal dysphagia c/b premature spillage, delayed swallow initiation, incomplete laryngeal closure, and diminished sensation.  These deficits resulted in mild, silent aspiration of thin liquid prior to the swallow x1 with serial straw sips of thin liquid.  Pt was unable to generate a cued cough to attempt to clear.  Additional trials of thin liquid by straw sips resulted in trace, transient penetration of thin liquid with serial straws.  There was no penetration or aspiration of any other consistencies.  Pt exhibted good oral clearance of solids after slightly prolonged oral phase.  With pill simulation, there was brief oral stasis of tablet on posterior lingual surface which cleared with liquid wash.  There was no increased penetration of thin liquid during pill simulation.  On esophageal sweep there was retention of contrast in the esophagus with backflow noted, especially with belching.    Recommend regular texture diet with thin liquids by cup.  Recommend close supervision with POs 2/2 impulsivity.  Factors that may increase risk of adverse event in presence of aspiration Rubye Oaks & Clearance Coots 2021):    Swallow Evaluation Recommendations Recommendations: PO diet  PO Diet Recommendation: Regular;Thin liquids (Level 0)  Liquid Administration via: Cup;No straw  Medication Administration: Whole meds with liquid  Supervision: Full supervision/cueing for  swallowing strategies  Swallowing strategies:  Slow rate; Small bites/sips; Single bites;  Single swallows  Postural changes: Position pt fully upright for meals  Oral care recommendations: Oral care BID (2x/day)      Kerrie Pleasure, MA, CCC-SLP Acute Rehabilitation Services Office: [phone] 03/24/2023,12:10 PM

## 2023-03-24 NOTE — Progress Notes (Addendum)
STROKE TEAM PROGRESS NOTE   INTERVAL HISTORY Patient is seen in his room with no family at the bedside.  He has had low-grade fevers, leukocytosis and right lower lobe pneumonia seen on chest x-ray.  Zosyn --> Unasyn.  He is otherwise hemodynamically stable, and neurological exam remains stable with continued transcortical aphasia.  Eating well, on IVF-will d/c.  Vitals:   03/23/23 2009 03/23/23 2318 03/24/23 0341 03/24/23 0730  BP: (!) 153/88 136/73 (!) 149/87 131/82  Pulse: 91 78 73 80  Resp: 18  18 17   Temp: 97.9 F (36.6 C) 99.3 F (37.4 C) 97.9 F (36.6 C) (!) 97.4 F (36.3 C)  TempSrc: Oral Axillary Axillary Oral  SpO2: 98% 96% 98% 97%   CBC:  Recent Labs  Lab 03/21/23 1518 03/21/23 2047 03/23/23 0404 03/24/23 0351  WBC 13.4*   < > 14.7* 12.0*  NEUTROABS 10.0*  --   --   --   HGB 14.4   < > 12.8* 12.2*  HCT 44.2   < > 38.8* 37.1*  MCV 84.7   < > 82.7 82.8  PLT 232   < > 193 193   < > = values in this interval not displayed.    Basic Metabolic Panel:  Recent Labs  Lab 03/23/23 0404 03/24/23 0351  NA 138 138  K 3.4* 3.6  CL 107 108  CO2 23 23  GLUCOSE 97 99  BUN <5* <5*  CREATININE 0.95 0.94  CALCIUM 8.1* 8.0*  MG 1.7  --     Lipid Panel:  Recent Labs  Lab 03/22/23 0443  CHOL 113  TRIG 109  HDL 37*  CHOLHDL 3.1  VLDL 22  LDLCALC 54    HgbA1c: No results for input(s): "HGBA1C" in the last 168 hours. Urine Drug Screen:  Recent Labs  Lab 03/22/23 0107  LABOPIA NONE DETECTED  COCAINSCRNUR POSITIVE*  LABBENZ NONE DETECTED  AMPHETMU NONE DETECTED  THCU POSITIVE*  LABBARB NONE DETECTED     Alcohol Level  Recent Labs  Lab 03/21/23 1518  ETH <10     IMAGING past 24 hours DG CHEST PORT 1 VIEW  Result Date: 03/23/2023 CLINICAL DATA:  100030 Leukocytosis 100030 EXAM: PORTABLE CHEST - 1 VIEW COMPARISON:  CT 08/14/2022 FINDINGS: focal airspace opacity laterally at the right lung base. Relatively low lung volumes with some crowding of  bronchovascular structures particularly in the lung bases. Heart size and mediastinal contours are within normal limits. No effusion. Visualized bones unremarkable. IMPRESSION: Focal right lower lobe airspace disease. Electronically Signed   By: Corlis Leak M.D.   On: 03/23/2023 10:03    PHYSICAL EXAM  General: Alert, well-nourished, well-developed patient in no acute distress Respiratory: Regular, unlabored respirations Neurological: Pupils equal round and reactive, but disconjugate gaze, OS palsy, with inability  to cross midline.  He is able to intermittently follow commands.  Patient has global transcortical aphasia but is able to repeat simple words.  Severe Right facial droop noted, 5 out of 5 strength in left upper extremity and left lower extremity, 3 out of 5 strength in upper right upper extremity, but worse distally and same for right lower extremity  ASSESSMENT/PLAN Mr. Brian Rios is a 42 y.o. male with history of depression, EtOH abuse, polysubstance abuse, embolic stroke in 2021, hyperlipidemia, schizophrenia and tobacco abuse presenting with acute onset right-sided weakness, right facial droop and dysarthria.  CTA revealed occlusion of 2 proximal left M2 branches, and patient was brought here for mechanical thrombectomy.  TICI  2C revascularization was achieved.  This morning, patient has persistent transcortical aphasia as well as right-sided weakness.  Repeat MRI reveals large left MCA stroke.  Stroke:  left MCA territory stroke due to left M2 occlusion s/p IR with TICI2c but reoccluded, likely large vessel disease from uncontrolled risk factors and cocaine use Code Stroke CT head likely acute infarct in posterior left parietal lobe ASPECTS 9 CTA head & neck occlusion of 2 left proximal M2 branches, left M1 stenosis MRI 5/30: Patchy acute left MCA infarct superimposed on chronic infarct S/p IR with left M2 TICI2c reperfusion MRI 5/31 interval increase in size of the acute  infarct involving the majority of the left and insular cortex and posterior left frontal operculum extending into high posterior left parietal lobe MRA previously occluded left M2 segment has again occluded  2D Echo EF 60-65% LDL 54 HgbA1c 5.7 UDS positive for cocaine and THC VTE prophylaxis - lovenox aspirin 325 mg daily prior to admission, now on aspirin 81 mg daily and Brilinta (ticagrelor) 90 mg bid.  Therapy recommendations: CIR Disposition: Pending  Hx of stroke 06/2020 left BG, left MCA, MCA/PCA and left MCA/ACA scattered infarcts, MRA head and neck negative, TEE neg, EF 50-55%, no PFO, UDS neg, LDL 200 and A1C 6.0, discharged on DAPT and lipitor 80  Hypertension Home meds: None Stable Will add lisinopril today BP < 180/105 Long-term BP goal normotensive  Hyperlipidemia Home meds: Atorvastatin 40 mg daily, resumed in hospital LDL 54, goal < 70 Continue statin at discharge  Fever Leukocytosis Tmax 100.7->afebrile-> continue low-grade fevers WBC 13.4->14.7->12.0 Chest x-ray reveals right lower lobe pneumonia Start Zosyn per pharmacy, transitioned to Unasyn  Dysphagia  Passed swallow, on nectar thick liquid Barium swallow, awaiting results to advance diet Gentle hydration  Avoid aspiration  Cocaine abuse UDS positive for cocaine Cessation education will be provided  Tobacco abuse Current smoker Smoking cessation counseling will be provided  Other Stroke Risk Factors ETOH use, alcohol level <10, advised to drink no more than 1-2 drink(s) a day THC abuse - UDS:  THC POSITIVE Patient will be advised to stop using due to stroke risk.  Other Active Problems Schizophrenia-continue home Abilify and Ingrezza Hypokalemia K 3.4 ->3.3 -> 3.6, now normal, supplement PRN  Hospital day # 3  J. Jefferson Fuel, PA-C, MPAS  [facility] See Amion for schedule and pager information 03/24/2023 8:56 AM  ATTENDING ATTESTATION:  42 year old with acute left MCA  stroke status post IR.   on aspirin and Brilinta.  Positive for cocaine and THC.   Add lisinopril today for BP control. D/C IVF as pt is eating.  CIR placement pending.  Dr. Viviann Spare evaluated pt independently, reviewed imaging, chart, labs. Discussed and formulated plan with the Resident/APP. Changes were made to the note where appropriate. Please see APP/resident note above for details.     Santana Edell,MD    To contact Stroke Continuity provider, please refer to [email or web address]. After hours, contact General Neurology

## 2023-03-25 ENCOUNTER — Ambulatory Visit: Payer: Medicaid Other | Admitting: Surgery

## 2023-03-25 DIAGNOSIS — I63312 Cerebral infarction due to thrombosis of left middle cerebral artery: Secondary | ICD-10-CM

## 2023-03-25 LAB — BASIC METABOLIC PANEL
Anion gap: 12 (ref 5–15)
BUN: 5 mg/dL — ABNORMAL LOW (ref 6–20)
CO2: 22 mmol/L (ref 22–32)
Calcium: 8.9 mg/dL (ref 8.9–10.3)
Chloride: 104 mmol/L (ref 98–111)
Creatinine, Ser: 1.07 mg/dL (ref 0.61–1.24)
GFR, Estimated: >60 mL/min (ref >60)
Glucose, Bld: 120 mg/dL — ABNORMAL HIGH (ref 70–99)
Potassium: 3.6 mmol/L (ref 3.5–5.1)
Sodium: 138 mmol/L (ref 135–145)

## 2023-03-25 LAB — CBC
HCT: 42.8 % (ref 39.0–52.0)
Hemoglobin: 14 g/dL (ref 13.0–17.0)
MCH: 27.6 pg (ref 26.0–34.0)
MCHC: 32.7 g/dL (ref 30.0–36.0)
MCV: 84.4 fL (ref 80.0–100.0)
Platelets: 240 10*3/uL (ref 150–400)
RBC: 5.07 MIL/uL (ref 4.22–5.81)
RDW: 14.3 % (ref 11.5–15.5)
WBC: 12.3 10*3/uL — ABNORMAL HIGH (ref 4.0–10.5)
nRBC: 0 % (ref 0.0–0.2)

## 2023-03-25 LAB — HEMOGLOBIN A1C
Hgb A1c MFr Bld: 5.6 % (ref 4.8–5.6)
Mean Plasma Glucose: 114 mg/dL

## 2023-03-25 LAB — GLUCOSE, CAPILLARY
Glucose-Capillary: 103 mg/dL — ABNORMAL HIGH (ref 70–99)
Glucose-Capillary: 90 mg/dL (ref 70–99)
Glucose-Capillary: 102 mg/dL — ABNORMAL HIGH (ref 70–99)
Glucose-Capillary: 97 mg/dL (ref 70–99)
Glucose-Capillary: 120 mg/dL — ABNORMAL HIGH (ref 70–99)

## 2023-03-25 MED ORDER — HALOPERIDOL LACTATE 5 MG/ML IJ SOLN
1.0000 mg | Freq: Once | INTRAMUSCULAR | Status: AC
  Filled 2023-03-25: qty 1

## 2023-03-25 MED ORDER — NICOTINE 21 MG/24HR TD PT24
21.0000 mg | MEDICATED_PATCH | Freq: Every day | TRANSDERMAL | Status: DC
  Administered 2023-03-25 – 2023-03-27 (×3): 21 mg via TRANSDERMAL
  Filled 2023-03-25 (×3): qty 1

## 2023-03-25 MED ORDER — QUETIAPINE FUMARATE 50 MG PO TABS
50.0000 mg | ORAL_TABLET | Freq: Three times a day (TID) | ORAL | Status: DC | PRN
  Administered 2023-03-25 – 2023-03-27 (×5): 50 mg via ORAL
  Filled 2023-03-25 (×5): qty 1

## 2023-03-25 MED ORDER — NICOTINE POLACRILEX 2 MG MT GUM: 2.0000 mg | CHEWING_GUM | OROMUCOSAL | Status: DC | PRN

## 2023-03-25 MED ORDER — HALOPERIDOL 0.5 MG PO TABS
1.0000 mg | ORAL_TABLET | Freq: Once | ORAL | Status: AC
  Administered 2023-03-25: 1 mg via ORAL
  Filled 2023-03-25: qty 2

## 2023-03-25 NOTE — Progress Notes (Signed)
Inpatient Rehab Coordinator Note:  I met with patient at bedside to discuss CIR recommendations and goals/expectations of CIR stay.  We reviewed 3 hrs/day of therapy, physician follow up, and average length of stay 2 weeks (dependent upon progress) with goals of supervision to min assist.  Pt initially does not appear to be agreeable to CIR stay, but unclear due to pretty severe aphasia.  I spoke to his mother on the phone and she is open for rehab, but would prefer a facility closer to their home in [place].  She is agreeable for him to stay here at [facility] if her 2 facility choices in [place] are unable to accept him.  I will continue to follow.   Estill Dooms, PT, DPT Admissions Coordinator [phone] 03/25/23  4:18 PM

## 2023-03-25 NOTE — Significant Event (Signed)
Found patient out of his chair and putting on his clothes and shoes. He has taken out all of his leads off and removed his PIV. Attempts made to understand patient's needs. Patient is trying to leave the hospital, he is refusing everything else when asked about but nodding yes that he is wanting to go home. RN made calls to his medical team as well as calling his mother and aunt in attempts to get him to be reasonable and remain safe. Patient is impulsive and frustrated at this time. Patient has tremors on his left hand/arm; patient nods when asked if that he does drink and smoke. New orders received. Patient's aunt came to bedside and stay with patient. Patient refusing for RN to put on his telemetry on and place a new IV for his antibiotics. Cortney, Neuro PA came to bedside and gave verbal order to hold off on IV placement and telemetry at this time until patient is more calmer and agreeable.

## 2023-03-25 NOTE — Progress Notes (Signed)
   03/25/23 1610  TOC Brief Assessment  Social Determinants of Health Reivew SDOH reviewed needs interventions  Readmission risk has been reviewed Yes  Transition of care needs transition of care needs identified, TOC will continue to follow   CSW reviewed chart. Noting recommendation for CIR, workup in progress. CSW to follow.

## 2023-03-25 NOTE — Progress Notes (Addendum)
STROKE TEAM PROGRESS NOTE   INTERVAL HISTORY Patient is seen in his room with no family at the bedside.  He has been transferred out of the ICU.  He remains hemodynamically stable, and his neurological exam is stable as well.  Labs continue to reveal mild leukocytosis, and patient remains on Unasyn for pneumonia. He gets intermittently agitated but can be redirected. Vitals:   03/24/23 2344 03/25/23 0353 03/25/23 0800 03/25/23 1100  BP: (!) 140/79 (!) 146/92 (!) 140/85 139/77  Pulse: 82 85 73 89  Resp: 18 17 14 20   Temp: 98.1 F (36.7 C) 98 F (36.7 C) 97.9 F (36.6 C) 98.3 F (36.8 C)  TempSrc: Oral Oral Oral Axillary  SpO2: 100% 100% 99% 96%   CBC:  Recent Labs  Lab 03/21/23 1518 03/21/23 2047 03/24/23 0351 03/25/23 1009  WBC 13.4*   < > 12.0* 12.3*  NEUTROABS 10.0*  --   --   --   HGB 14.4   < > 12.2* 14.0  HCT 44.2   < > 37.1* 42.8  MCV 84.7   < > 82.8 84.4  PLT 232   < > 193 240   < > = values in this interval not displayed.    Basic Metabolic Panel:  Recent Labs  Lab 03/23/23 0404 03/24/23 0351  NA 138 138  K 3.4* 3.6  CL 107 108  CO2 23 23  GLUCOSE 97 99  BUN <5* <5*  CREATININE 0.95 0.94  CALCIUM 8.1* 8.0*  MG 1.7  --     Lipid Panel:  Recent Labs  Lab 03/22/23 0443  CHOL 113  TRIG 109  HDL 37*  CHOLHDL 3.1  VLDL 22  LDLCALC 54    HgbA1c:  Recent Labs  Lab 03/23/23 0404  HGBA1C 5.6   Urine Drug Screen:  Recent Labs  Lab 03/22/23 0107  LABOPIA NONE DETECTED  COCAINSCRNUR POSITIVE*  LABBENZ NONE DETECTED  AMPHETMU NONE DETECTED  THCU POSITIVE*  LABBARB NONE DETECTED     Alcohol Level  Recent Labs  Lab 03/21/23 1518  ETH <10     IMAGING past 24 hours No results found.  PHYSICAL EXAM  General: Alert, well-nourished, well-developed middle-aged African-American male in no acute distress Respiratory: Regular, unlabored respirations Neurological: Awake alert and interactive.  Severe expressive aphasia with word finding  difficulties and word hesitancy.  Pupils equal round and reactive, but disconjugate gaze, OS palsy, with inability  to cross midline.  He is able to intermittently follow commands.  Patient  is able to repeat  occasional simple simple words.  He is unable to name objects and can only say "yes" or "all right".  Severe Right facial droop noted, 5 out of 5 strength in left upper extremity and left lower extremity, 3 out of 5 strength in upper right upper extremity  ASSESSMENT/PLAN Mr. Brian Rios is a 42 y.o. male with history of depression, EtOH abuse, polysubstance abuse, embolic stroke in 2021, hyperlipidemia, schizophrenia and tobacco abuse presenting with acute onset right-sided weakness, right facial droop and dysarthria.  CTA revealed occlusion of 2 proximal left M2 branches, and patient was brought here for mechanical thrombectomy.  TICI 2C revascularization was achieved.  This morning, patient has persistent transcortical aphasia as well as right-sided weakness.  Repeat MRI reveals large left MCA stroke.  Stroke:  left MCA territory stroke due to left M2 occlusion s/p IR with TICI2c but reoccluded, likely large vessel disease from uncontrolled risk factors and cocaine use Code Stroke CT  head likely acute infarct in posterior left parietal lobe ASPECTS 9 CTA head & neck occlusion of 2 left proximal M2 branches, left M1 stenosis MRI 5/30: Patchy acute left MCA infarct superimposed on chronic infarct S/p IR with left M2 TICI2c reperfusion MRI 5/31 interval increase in size of the acute infarct involving the majority of the left and insular cortex and posterior left frontal operculum extending into high posterior left parietal lobe MRA previously occluded left M2 segment has again occluded  2D Echo EF 60-65% LDL 54 HgbA1c 5.7 UDS positive for cocaine and THC VTE prophylaxis - lovenox aspirin 325 mg daily prior to admission, now on aspirin 81 mg daily and Brilinta (ticagrelor) 90 mg bid.   Therapy recommendations: CIR Disposition: Pending  Hx of stroke 06/2020 left BG, left MCA, MCA/PCA and left MCA/ACA scattered infarcts, MRA head and neck negative, TEE neg, EF 50-55%, no PFO, UDS neg, LDL 200 and A1C 6.0, discharged on DAPT and lipitor 80  Hypertension Home meds: None Stable Lisinopril 10 mg daily BP < 180/105 Long-term BP goal normotensive  Hyperlipidemia Home meds: Atorvastatin 40 mg daily, resumed in hospital LDL 54, goal < 70 Continue statin at discharge  Fever Leukocytosis Tmax 100.7->afebrile-> continue low-grade fevers WBC 13.4->14.7->12.0-> 12.3 Chest x-ray reveals right lower lobe pneumonia Start Zosyn per pharmacy, transitioned to Unasyn  Dysphagia  Passed swallow, on nectar thick liquid Barium swallow, awaiting results to advance diet Gentle hydration  Avoid aspiration  Cocaine abuse UDS positive for cocaine Cessation education will be provided  Tobacco abuse Current smoker Smoking cessation counseling will be provided  Other Stroke Risk Factors ETOH use, alcohol level <10, advised to drink no more than 1-2 drink(s) a day THC abuse - UDS:  THC POSITIVE Patient will be advised to stop using due to stroke risk.  Other Active Problems Schizophrenia-continue home Abilify and Ingrezza Hypokalemia K 3.4 ->3.3 -> 3.6, now normal, supplement PRN  Hospital day # 4  Cortney E Ernestina Brian Rios , MSN, AGACNP-BC [facility] See Amion for schedule and pager information 03/25/2023 11:43 AM   I have personally obtained history,examined this patient, reviewed notes, independently viewed imaging studies, participated in medical decision making and plan of care.ROS completed by me personally and pertinent positives fully documented  I have made any additions or clarifications directly to the above note. Agree with note above.  Continue aspirin and Brilinta for stent and ongoing physical occupational and speech therapy.  Transfer to rehab when bed  available after insurance approval.  No family available at the bedside.  Greater than 50% time during this 35-minute visit was spent in counseling and coordination of care about his stroke and aphasia and discussion about stroke prevention and discussion with care team  Delia Heady, MD Medical Director [facility] Pager: [phone] 03/25/2023 2:02 PM  To contact Stroke Continuity provider, please refer to [email or web address]. After hours, contact General Neurology

## 2023-03-25 NOTE — TOC Initial Note (Signed)
Transition of Care [facility]) - Initial/Assessment Note    Patient Details  Name: Brian Rios MRN: [id number] Date of Birth: 11-Feb-1981  Transition of Care [facility]) CM/SW Contact:    Ronny Bacon, RN Phone Number: 03/25/2023, 4:25 PM  Clinical Narrative:   Sherron Monday with patient and Aunt at bedside. Patient unable to verbalize words, can nod head for yes or no questions. Aunt did talking for patient, reports lives in 1 story home with his mom, he does not have any DME equipment. Patient does not drive and did not drive prior to this hospital admission. Mom transports patient to doctor visits. Aunt reports that patient has new health care provider but does not know the name. Case Manager information given to Aunt to give information once available. Patient has not had any prior home health services.               Expected Discharge Plan: IP Rehab Facility Barriers to Discharge: Continued Medical Work up   Patient Goals and CMS Choice Patient states their goals for this hospitalization and ongoing recovery are:: to improve function          Expected Discharge Plan and Services   Discharge Planning Services: CM Consult   Living arrangements for the past 2 months: Single Family Home                                      Prior Living Arrangements/Services Living arrangements for the past 2 months: Single Family Home Lives with:: Parents (Mom) Patient language and need for interpreter reviewed:: Yes Do you feel safe going back to the place where you live?: Yes      Need for Family Participation in Patient Care: Yes (Comment) Care giver support system in place?: Yes (comment)   Criminal Activity/Legal Involvement Pertinent to Current Situation/Hospitalization: No - Comment as needed  Activities of Daily Living      Permission Sought/Granted Permission sought to share information with : Case Manager                Emotional Assessment Appearance:: Appears  stated age Attitude/Demeanor/Rapport: Engaged Affect (typically observed): Appropriate Orientation: : Oriented to Self, Oriented to Place, Oriented to  Time, Oriented to Situation Alcohol / Substance Use: Not Applicable Psych Involvement: No (comment)  Admission diagnosis:  Stroke (cerebrum) (HCC) [I63.9] Middle cerebral artery embolism, 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] Patient Active Problem List   Diagnosis Date Noted   Stroke (cerebrum) (HCC) 03/21/2023   Middle cerebral artery embolism, 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   Follow-up exam 10/25/2022   Hospital discharge follow-up 09/20/2022   Small bowel mass 09/04/2022   S/P right colectomy 09/04/2022   Dental decay 06/12/2022   Chest pain 05/17/2022   Abnormal CT scan 12/28/2021   Elevated blood pressure reading 07/26/2021   Erectile dysfunction 07/26/2021   H/O elevated lipids 03/28/2021   Embolic stroke (HCC) 07/18/2020   Stroke (HCC) 07/17/2020   Drug abuse (HCC) 07/17/2020   Major depressive disorder, recurrent episode, severe (HCC) 02/20/2015   Alcohol use disorder, severe, dependence (HCC) 02/20/2015   Stimulant use disorder 02/20/2015   Tobacco use disorder 02/20/2015   PCP:  Remo Lipps, PA Pharmacy:   [facility], [place] - [address] [place] [address] Phone: [phone] Fax: [phone]     Social Determinants of Health (SDOH) Social History: SDOH Screenings  Food Insecurity: Food Insecurity Present (06/12/2022)  Housing: High Risk (06/12/2022)  Transportation Needs: Unmet Transportation Needs (06/12/2022)  Alcohol Screen: Low Risk  (03/08/2021)  Depression (PHQ2-9): High Risk (06/12/2022)  Tobacco Use: High Risk (03/23/2023)   SDOH Interventions:     Readmission Risk Interventions     No data to display

## 2023-03-25 NOTE — Progress Notes (Signed)
Physical Therapy Treatment Patient Details Name: Brian Rios MRN: [id number] DOB: 1981/04/24 Today's Date: 03/25/2023   History of Present Illness Brian Rios is a 42 y.o. male who presented 03/21/2023 to the [facility] ED today with acute onset of right sided weakness, right facial droop and dysphasia; pt transported to [facility] for emergent thrombectomy. TICI 2C revascularization was achieved. MRI reveals large left MCA stroke. 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    PT Comments    Pt making excellent progress towards his physical therapy goals and is highly motivated to participate. Session focused on gait training and closed chain exercises for increased RLE neuro-recovery and strengthening. Pt requiring min assist (+2 safety) overall for functional mobility. Would highly benefit from intensive post acute rehab to address deficits and maximize functional independence. Suspect continued progress based on age, PLOF, and motivation.    Recommendations for follow up therapy are one component of a multi-disciplinary discharge planning process, led by the attending physician.  Recommendations may be updated based on patient status, additional functional criteria and insurance authorization.  Follow Up Recommendations       Assistance Recommended at Discharge Frequent or constant Supervision/Assistance  Patient can return home with the following Assistance with cooking/housework;Direct supervision/assist for medications management;Assist for transportation;Help with stairs or ramp for entrance;A little help with walking and/or transfers;A lot of help with bathing/dressing/bathroom   Equipment Recommendations  None recommended by PT    Recommendations for Other Services       Precautions / Restrictions  Precautions Precautions: Fall Restrictions Weight Bearing Restrictions: No     Mobility  Bed Mobility Overal bed mobility: Needs Assistance Bed Mobility: Supine to Sit     Supine to sit: Supervision     General bed mobility comments: Exiting towards right side of bed, no physical assist required    Transfers Overall transfer level: Needs assistance Equipment used: 1 person hand held assist Transfers: Sit to/from Stand Sit to Stand: Min guard, +2 safety/equipment           General transfer comment: Good power up to stand, min guard for safety. Mildly impulsive but able to be redirected easily    Ambulation/Gait Ambulation/Gait assistance: Min assist, +2 safety/equipment Gait Distance (Feet): 100 Feet (100", 100") Assistive device: 1 person hand held assist, None Gait Pattern/deviations: Step-through pattern, Decreased stance time - right, Decreased dorsiflexion - right, Decreased weight shift to right, Wide base of support, Decreased step length - right Gait velocity: decreased Gait velocity interpretation: <1.31 ft/sec, indicative of household ambulator   General Gait Details: Verbal cueing for RLE neutral positioning and larger step length   Stairs Stairs: Yes Stairs assistance: Min guard Stair Management: Two rails Number of Stairs: 10     Wheelchair Mobility    Modified Rankin (Stroke Patients Only) Modified Rankin (Stroke Patients Only) Pre-Morbid Rankin Score: No symptoms Modified Rankin: Moderately severe disability     Balance Overall balance assessment: Needs assistance Sitting-balance support: Feet supported Sitting balance-Leahy Scale: Good     Standing balance support: No upper extremity supported, During functional activity Standing balance-Leahy Scale: Fair                              Cognition Arousal/Alertness: Awake/alert Behavior During Therapy: Impulsive Overall Cognitive Status: Difficult to assess  General Comments: follows most simple commands, more difficulty with multistep directions. Pt attempting to communicate with head nodds and "yes/no" verbalizations, unsure of accuracy. Noted impulsivity and limited safety awareness throughout        Exercises Other Exercises Other Exercises: x10 forward step ups with RLE leading for strengthening Other Exercises: Modified push ups with BUE's (assist provided at RUE) x 10    General Comments        Pertinent Vitals/Pain Pain Assessment Pain Assessment: Faces Faces Pain Scale: No hurt    Home Living                          Prior Function            PT Goals (current goals can now be found in the care plan section) Acute Rehab PT Goals Patient Stated Goal: get home Potential to Achieve Goals: Good Progress towards PT goals: Progressing toward goals    Frequency    Min 4X/week      PT Plan Current plan remains appropriate    Co-evaluation              AM-PAC PT "6 Clicks" Mobility   Outcome Measure  Help needed turning from your back to your side while in a flat bed without using bedrails?: A Little Help needed moving from lying on your back to sitting on the side of a flat bed without using bedrails?: A Little Help needed moving to and from a bed to a chair (including a wheelchair)?: A Little Help needed standing up from a chair using your arms (e.g., wheelchair or bedside chair)?: A Little Help needed to walk in hospital room?: A Little Help needed climbing 3-5 steps with a railing? : A Little 6 Click Score: 18    End of Session Equipment Utilized During Treatment: Gait belt Activity Tolerance: Patient tolerated treatment well Patient left: in chair;with call bell/phone within reach;with chair alarm set Nurse Communication: Mobility status PT Visit Diagnosis: Muscle weakness (generalized) (M62.81);Difficulty in walking, not elsewhere classified (R26.2);Other  abnormalities of gait and mobility (R26.89);Unsteadiness on feet (R26.81);Hemiplegia and hemiparesis Hemiplegia - Right/Left: Right Hemiplegia - caused by: Cerebral infarction     Time: 1610-9604 PT Time Calculation (min) (ACUTE ONLY): 29 min  Charges:  $Gait Training: 8-22 mins $Therapeutic Activity: 8-22 mins                     Lillia Pauls, PT, DPT Acute Rehabilitation Services Office [phone]    Norval Morton 03/25/2023, 10:32 AM

## 2023-03-25 NOTE — PMR Pre-admission (Signed)
PMR Admission Coordinator Pre-Admission Assessment  Patient: Brian Rios is an 42 y.o., male MRN: [id number] DOB: 1981/09/16 Height:   Weight:    Insurance Information HMO:     PPO:      PCP:      IPA:      80/20:      OTHER:  PRIMARY: Medicaid [place] Access      Policy#: [id number]      Subscriber: pt CM Name:       Phone#:      Fax#:  Pre-Cert#:       Employer:  Benefits:  Phone #:      Name:  Eff. Date: as of 03/25/23     Deduct:       Out of Pocket Max:       Life Max:  CIR: 100%      SNF:  Outpatient:      Co-Pay:  Home Health:       Co-Pay:  DME:      Co-Pay:  Providers:  SECONDARY:       Policy#:      Phone#:   Artist:       Phone#:   The Data processing manager" for patients in Inpatient Rehabilitation Facilities with attached "Privacy Act Statement-Health Care Records" was provided and verbally reviewed with: N/A  Emergency Contact Information Contact Information     Name Relation Home Work Mobile   Brian Rios Mother   [phone]       Current Medical History  Patient Admitting Diagnosis: CVA   History of Present Illness: Pt is a 42 y/o male with PMH of L MCA CVA without residual deficit, HLD, polysubstance abuse, schizophrenia, bipolar disorder, major depression, and small bowel resection admitted to [facility] ED on 03/21/23 due to acute onset of right sided weakness, facial droop, and dysphasia.  CTA revealed occlusion of 2 proximal left M2 branches.  Pt underwent thrombectomy at [facility] on 5/30 for revascularization.  Pt has persistent transcortical aphasia and R hemiparesis.  Repeat MRI 5/31 reveals large L MCA stroke, likely caused by large vessel disease from uncontrolled risk factors and cocaine use.  Repeat MRA showed reocclusion of left M2 segment.  Echo WNL, HgbA1c 5.7.  Stroke service recommend DAPT with Brilinta and asa.  Therapy evaluations completed and pt was recommended for CIR.   Complete NIHSS TOTAL:  16  Patient's medical record from [facility] has been reviewed by the rehabilitation admission coordinator and physician.  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 No  Family History   family history includes Alcoholism in his father and mother; Breast cancer in his mother; Diabetes in his maternal grandmother; Hyperlipidemia in his mother; Hypertension in his mother.  Current Medications  Current Facility-Administered Medications:    acetaminophen (TYLENOL) tablet 650 mg, 650 mg, Oral, Q4H PRN, 650 mg at 03/24/23 2001 **OR** acetaminophen (TYLENOL) 160 MG/5ML solution 650 mg, 650 mg, Per Tube, Q4H PRN **OR** acetaminophen (TYLENOL) suppository 650 mg, 650 mg, Rectal, Q4H PRN, Deveshwar, Sanjeev, MD   Ampicillin-Sulbactam (UNASYN) 3  g in sodium chloride 0.9 % 100 mL IVPB, 3 g, Intravenous, Q6H, Mosetta Anis, RPH, Last Rate: 200 mL/hr at 03/26/23 0404, 3 g at 03/26/23 0404   ARIPiprazole (ABILIFY) tablet 30 mg, 30 mg, Oral, QHS, Caryl Pina, MD, 30 mg at 03/25/23 2218   aspirin chewable tablet 81 mg, 81 mg, Oral, Daily, 81 mg at 03/26/23 0820 **OR** [DISCONTINUED] aspirin chewable tablet 81 mg, 81 mg, Per Tube, Daily, Deveshwar, Sanjeev, MD   atorvastatin (LIPITOR) tablet 40 mg, 40 mg, Oral, Daily, Caryl Pina, MD, 40 mg at 03/26/23 0820   benztropine (COGENTIN) tablet 1 mg, 1 mg, Oral, QHS, Caryl Pina, MD, 1 mg at 03/25/23 2218   divalproex (DEPAKOTE ER) 24 hr tablet 750 mg, 750 mg, Oral, QHS, de La Torre, [place] E, NP, 750 mg at 03/25/23 2217   enoxaparin (LOVENOX) injection 40 mg, 40 mg, Subcutaneous, Q24H, Marvel Plan, MD, 40 mg at 03/25/23 2212    escitalopram (LEXAPRO) tablet 20 mg, 20 mg, Oral, Daily, Caryl Pina, MD, 20 mg at 03/26/23 0820   lisinopril (ZESTRIL) tablet 10 mg, 10 mg, Oral, Daily, Spoon, Jeffrey J, PA-C, 10 mg at 03/26/23 0820   mirtazapine (REMERON) tablet 45 mg, 45 mg, Oral, QHS, Caryl Pina, MD, 45 mg at 03/25/23 2211   nicotine (NICODERM CQ - dosed in mg/24 hours) patch 21 mg, 21 mg, Transdermal, Daily, de La Torre, Butler E, NP, 21 mg at 03/26/23 0820   nicotine polacrilex (NICORETTE) gum 2 mg, 2 mg, Oral, PRN, de Saintclair Halsted, Cortney E, NP   Oral care mouth rinse, 15 mL, Mouth Rinse, BID, Palikh, Gaurang M, MD, 15 mL at 03/26/23 4782   QUEtiapine (SEROQUEL) tablet 50 mg, 50 mg, Oral, TID PRN, de Saintclair Halsted, Cortney E, NP, 50 mg at 03/26/23 0820   senna-docusate (Senokot-S) tablet 1 tablet, 1 tablet, Oral, QHS PRN, Caryl Pina, MD   ticagrelor (BRILINTA) tablet 90 mg, 90 mg, Oral, BID, 90 mg at 03/26/23 0820 **OR** [DISCONTINUED] ticagrelor (BRILINTA) tablet 90 mg, 90 mg, Per Tube, BID, Julieanne Cotton, MD  Patients Current Diet:  Diet Order             Diet regular Room service appropriate? Yes; Fluid consistency: Thin  Diet effective now                   Precautions / Restrictions Precautions Precautions: Fall Restrictions Weight Bearing Restrictions: No   Has the patient had 2 or more falls or a fall with injury in the past year? 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 Independent  Indoor Mobility: Did the patient need assistance with walking from room to room (with or without device)? Independent  Stairs: Did the patient need assistance with internal or external stairs (with or without device)? Independent  Functional Cognition: Did the patient need help  planning regular tasks such as shopping or remembering to take medications? Independent  Patient Information Are you of Hispanic, Latino/a,or Spanish origin?: X. Patient unable to respond, A. No, not of Hispanic, Latino/a, or Spanish origin (per chart) What is your race?: X. Patient unable to respond, B. Black or African American (chart) Do you need or want an interpreter to communicate with a doctor or health care staff?: 9. Unable to respond  Patient's Response To:  Health Literacy and Transportation Is the patient able to respond to health literacy and transportation needs?: No  Home Assistive Devices / Equipment    Prior Device Use: Indicate devices/aids used by the patient prior to current illness, exacerbation or injury? None of the above  Current Functional Level Cognition  Arousal/Alertness: Awake/alert Overall Cognitive Status: Difficult to assess Difficult to assess due to: Impaired communication Orientation Level: Oriented X4 General Comments: follows most simple commands, more difficulty with multistep directions. Pt attempting to communicate with head nodds and "yes/no" verbalizations, unsure of accuracy. Noted impulsivity and limited safety awareness throughout Awareness: Impaired Awareness Impairment: Emergent impairment, Anticipatory impairment Problem Solving: Impaired Problem Solving Impairment: Functional basic    Extremity Assessment (includes Sensation/Coordination)  Upper Extremity Assessment: Defer to OT evaluation RUE Deficits / Details: moving against gravity proximally, no activation at wrist or hand. shoulder and elbow MMT 2/5, full PROM. edematous. reponds to noxious stim RUE Sensation: decreased light touch, decreased proprioception RUE Coordination: decreased fine motor, decreased gross motor  Lower Extremity Assessment: RLE deficits/detail, LLE deficits/detail, Difficult to assess due to impaired cognition RLE Deficits / Details: grossly 3/5 with  manual cues needed to engage resistance testing for hip ext, knee flex/ext, and dorsi/plantarflexion RLE Sensation: decreased proprioception RLE Coordination: decreased fine motor, decreased gross motor LLE Deficits / Details: WFL's for strength, cognition limited pt's understanding for MMT    ADLs  Overall ADL's : Needs assistance/impaired Eating/Feeding: Maximal assistance, Sitting Grooming: Moderate assistance, Sitting Upper Body Bathing: Moderate assistance, Sitting Lower Body Bathing: Maximal assistance, +2 for safety/equipment, Sit to/from stand Upper Body Dressing : Moderate assistance, Sitting Lower Body Dressing: Maximal assistance, +2 for safety/equipment, Sit to/from stand Toilet Transfer: Moderate assistance, +2 for physical assistance, +2 for safety/equipment, Ambulation, Regular Toilet Toileting- Clothing Manipulation and Hygiene: Maximal assistance, Sit to/from stand, +2 for safety/equipment Functional mobility during ADLs: Moderate assistance, +2 for safety/equipment General ADL Comments: limited by R weakness, communication, safety, L inattention    Mobility  Overal bed mobility: Needs Assistance Bed Mobility: Supine to Sit Supine to sit: Supervision General bed mobility comments: Exiting towards right side of bed, no physical assist required    Transfers  Overall transfer level: Needs assistance Equipment used: 1 person hand held assist Transfers: Sit to/from Stand Sit to Stand: Min guard, +2 safety/equipment General transfer comment: Good power up to stand, min guard for safety. Mildly impulsive but able to be redirected easily    Ambulation / Gait / Stairs / Wheelchair Mobility  Ambulation/Gait Ambulation/Gait assistance: Min assist, +2 safety/equipment Gait Distance (Feet): 100 Feet (100", 100") Assistive device: 1 person hand held assist, None Gait Pattern/deviations: Step-through pattern, Decreased stance time - right, Decreased dorsiflexion - right,  Decreased weight shift to right, Wide base of support, Decreased step length - right General Gait Details: Verbal cueing for RLE neutral positioning and larger step length Gait velocity: decreased Gait velocity interpretation: <1.31 ft/sec, indicative of household ambulator Stairs: Yes Stairs assistance: Min guard Stair Management: Two rails Number of Stairs: 10    Posture / Balance Dynamic Sitting Balance Sitting balance - Comments: R lateral lean Balance Overall balance assessment: Needs assistance Sitting-balance support: Feet supported Sitting balance-Leahy Scale: Good Sitting balance - Comments: R lateral lean Postural control: Right lateral lean Standing balance support: No upper extremity supported, During functional activity Standing balance-Leahy Scale: Fair Standing balance comment: R lateral lean mod to correct    Special needs/care consideration Behavioral consideration Schizophrenia, bipolar, MDD   Previous Home Environment (from  acute therapy documentation) Additional Comments: pt presents with impiared communication  Discharge Living Setting Plans for Discharge Living Setting: Lives with (comment) (mom) Type of Home at Discharge: House Discharge Home Layout: One level Discharge Home Access: Level entry (through back door) Discharge Bathroom Shower/Tub: Tub/shower unit Discharge Bathroom Toilet: Standard Discharge Bathroom Accessibility: Yes How Accessible: Accessible via walker Does the patient have any problems obtaining your medications?: No  Social/Family/Support Systems Anticipated Caregiver: mom, Morley Kos Anticipated Caregiver's Contact Information: [phone] Ability/Limitations of Caregiver: supervision/min assist for ADLs Caregiver Availability: 24/7 Discharge Plan Discussed with Primary Caregiver: Yes Is Caregiver In Agreement with Plan?: Yes Does Caregiver/Family have Issues with Lodging/Transportation while Pt is in Rehab?:  No  Goals Patient/Family Goal for Rehab: PT supervision/mod I, OT supervision/min assist, SLP mod assist Expected length of stay: 10-12 days Additional Information: Discharge plan: return to pt's home where he lives with his mother, mom Lupita Leash) can provide 24/7 assist at discharge Pt/Family Agrees to Admission and willing to participate: Yes Program Orientation Provided & Reviewed with Pt/Caregiver Including Roles  & Responsibilities: Yes  Barriers to Discharge: Insurance for SNF coverage  Decrease burden of Care through IP rehab admission: n/a  Possible need for SNF placement upon discharge: Not anticipated.  Plan to return to pt's home where he lives with his mother; she can provide 24/7 supervision/assist   Patient Condition: I have reviewed medical records from [facility], spoken with  [facility] , and patient and family member. I met with patient at the bedside and discussed via phone for inpatient rehabilitation assessment.  Patient will benefit from ongoing PT, OT, and SLP, can actively participate in 3 hours of therapy a day 5 days of the week, and can make measurable gains during the admission.  Patient will also benefit from the coordinated team approach during an Inpatient Acute Rehabilitation admission.  The patient will receive intensive therapy as well as Rehabilitation physician, nursing, social worker, and care management interventions.  Due to bladder management, bowel management, safety, skin/wound care, disease management, medication administration, pain management, and patient education the patient requires 24 hour a day rehabilitation nursing.  The patient is currently *** with mobility and basic ADLs.  Discharge setting and therapy post discharge at home with home health is anticipated.  Patient has agreed to participate in the Acute Inpatient Rehabilitation Program and will admit {Time; today/tomorrow:10263}.  Preadmission Screen Completed By:  Stephania Fragmin, PT, DPT 03/26/2023  10:36 AM ______________________________________________________________________   Discussed status with Dr. Marland Kitchen on *** at *** and received approval for admission today.  Admission Coordinator:  Stephania Fragmin, PT, DPT time 10:36 AM Dorna Bloom 03/26/23    Assessment/Plan: Diagnosis: Does the need for close, 24 hr/day Medical supervision in concert with the patient's rehab needs make it unreasonable for this patient to be served in a less intensive setting? {yes_no_potentially:3041433} Co-Morbidities requiring supervision/potential complications: *** Due to {due QM:5784696}, does the patient require 24 hr/day rehab nursing? {yes_no_potentially:3041433} Does the patient require coordinated care of a physician, rehab nurse, PT, OT, and SLP to address physical and functional deficits in the context of the above medical diagnosis(es)? {yes_no_potentially:3041433} Addressing deficits in the following areas: {deficits:3041436} Can the patient actively participate in an intensive therapy program of at least 3 hrs of therapy 5 days a week? {yes_no_potentially:3041433} The potential for patient to make measurable gains while on inpatient rehab is {potential:3041437} Anticipated functional outcomes upon discharge from inpatient rehab: {functional outcomes:304600100} PT, {functional outcomes:304600100} OT, {functional outcomes:304600100} SLP Estimated rehab length  of stay to reach the above functional goals is: *** Anticipated discharge destination: {anticipated dc setting:21604} 10. Overall Rehab/Functional Prognosis: {potential:3041437}   MD Signature: ***

## 2023-03-26 ENCOUNTER — Ambulatory Visit: Payer: Medicaid Other | Admitting: Physician Assistant

## 2023-03-26 DIAGNOSIS — I6300 Cerebral infarction due to thrombosis of unspecified precerebral artery: Secondary | ICD-10-CM

## 2023-03-26 LAB — GLUCOSE, CAPILLARY
Glucose-Capillary: 83 mg/dL (ref 70–99)
Glucose-Capillary: 91 mg/dL (ref 70–99)
Glucose-Capillary: 97 mg/dL (ref 70–99)

## 2023-03-26 LAB — CBC
HCT: 39.5 % (ref 39.0–52.0)
Hemoglobin: 13 g/dL (ref 13.0–17.0)
MCH: 27.5 pg (ref 26.0–34.0)
MCHC: 32.9 g/dL (ref 30.0–36.0)
MCV: 83.7 fL (ref 80.0–100.0)
Platelets: 237 10*3/uL (ref 150–400)
RBC: 4.72 MIL/uL (ref 4.22–5.81)
RDW: 14 % (ref 11.5–15.5)
WBC: 11.6 10*3/uL — ABNORMAL HIGH (ref 4.0–10.5)
nRBC: 0 % (ref 0.0–0.2)

## 2023-03-26 LAB — BASIC METABOLIC PANEL
Anion gap: 12 (ref 5–15)
BUN: 6 mg/dL (ref 6–20)
CO2: 22 mmol/L (ref 22–32)
Calcium: 8.7 mg/dL — ABNORMAL LOW (ref 8.9–10.3)
Chloride: 106 mmol/L (ref 98–111)
Creatinine, Ser: 1.1 mg/dL (ref 0.61–1.24)
GFR, Estimated: >60 mL/min (ref >60)
Glucose, Bld: 86 mg/dL (ref 70–99)
Potassium: 3.8 mmol/L (ref 3.5–5.1)
Sodium: 140 mmol/L (ref 135–145)

## 2023-03-26 NOTE — Progress Notes (Addendum)
Physical Therapy Treatment Patient Details Name: Brian Rios MRN: [id number] DOB: 07/07/1981 Today's Date: 03/26/2023   History of Present Illness Brian Rios is a 42 y.o. male who presented 03/21/2023 to the [facility] ED today with acute onset of right sided weakness, right facial droop and dysphasia; pt transported to [facility] for emergent thrombectomy. TICI 2C revascularization was achieved. MRI reveals large left MCA stroke. 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    PT Comments    Pt making steady progression towards his physical therapy goals and is agreeable for participation. Pt having some visible frustration regarding continued aphasia and difficulty communicating; does benefit from communication board and seems to have more accurate yes/no responses today. Follows 1 step commands well, although has some impulsivity. Pt requiring min assist overall for functional mobility. Emphasis on dynamic standing balance and increasing LUE input/weight bearing to promote neuro recovery. Pt has excellent potential based on age, motivation and PLOF. Patient will benefit from intensive inpatient follow up therapy, >3 hours/day.    Recommendations for follow up therapy are one component of a multi-disciplinary discharge planning process, led by the attending physician.  Recommendations may be updated based on patient status, additional functional criteria and insurance authorization.  Follow Up Recommendations       Assistance Recommended at Discharge Frequent or constant Supervision/Assistance  Patient can return home with the following Assistance with cooking/housework;Direct supervision/assist for medications management;Assist for transportation;Help with stairs or ramp for entrance;A little help with walking and/or  transfers;A lot of help with bathing/dressing/bathroom   Equipment Recommendations  None recommended by PT    Recommendations for Other Services       Precautions / Restrictions Precautions Precautions: Fall Restrictions Weight Bearing Restrictions: No     Mobility  Bed Mobility               General bed mobility comments: OOB in chair    Transfers Overall transfer level: Needs assistance Equipment used: 1 person hand held assist Transfers: Sit to/from Stand Sit to Stand: Min guard           General transfer comment: Min guard for safety due to impulsivity    Ambulation/Gait Ambulation/Gait assistance: Min assist Gait Distance (Feet): 100 Feet (100", 100") Assistive device: None Gait Pattern/deviations: Step-through pattern, Decreased stance time - right, Decreased dorsiflexion - right, Decreased weight shift to right, Wide base of support, Decreased step length - right Gait velocity: decreased     General Gait Details: Improved right step length today with continued mild external rotation during midstance, minA for dynamic stability.   Stairs Stairs: Yes Stairs assistance: Min guard Stair Management: Two rails Number of Stairs: 5     Wheelchair Mobility    Modified Rankin (Stroke Patients Only) Modified Rankin (Stroke Patients Only) Pre-Morbid Rankin Score: No symptoms Modified Rankin: Moderately severe disability     Balance Overall balance assessment: Needs assistance Sitting-balance support: Feet supported Sitting balance-Leahy Scale: Good     Standing balance support: No upper extremity supported, During functional activity Standing balance-Leahy Scale: Fair                              Cognition Arousal/Alertness: Awake/alert Behavior During Therapy: Impulsive Overall Cognitive Status: Difficult to assess  General Comments: follows most simple commands, more difficulty  with multistep directions. Pt attempting to communicate with head nodds and "yes/no" verbalizations, unsure of accuracy. Noted impulsivity and limited safety awareness throughout. Able to utilize communication board for more consistent "yes" and "no" responses. Pt having difficulty locating music app on phone, requires assist        Exercises Other Exercises Other Exercises: Dynamic standing balance task of anterior table slides with BUE's Other Exercises: Modified push ups with BUE's (assist provided at RUE) x 10 (3 sets)    General Comments        Pertinent Vitals/Pain Pain Assessment Pain Assessment: Faces Faces Pain Scale: No hurt    Home Living                          Prior Function            PT Goals (current goals can now be found in the care plan section) Acute Rehab PT Goals Patient Stated Goal: get home Potential to Achieve Goals: Good Progress towards PT goals: Progressing toward goals    Frequency    Min 4X/week      PT Plan Current plan remains appropriate    Co-evaluation              AM-PAC PT "6 Clicks" Mobility   Outcome Measure  Help needed turning from your back to your side while in a flat bed without using bedrails?: None Help needed moving from lying on your back to sitting on the side of a flat bed without using bedrails?: A Little Help needed moving to and from a bed to a chair (including a wheelchair)?: A Little Help needed standing up from a chair using your arms (e.g., wheelchair or bedside chair)?: A Little Help needed to walk in hospital room?: A Little Help needed climbing 3-5 steps with a railing? : A Little 6 Click Score: 19    End of Session Equipment Utilized During Treatment: Gait belt Activity Tolerance: Patient tolerated treatment well Patient left: in chair;with call bell/phone within reach;with chair alarm set Nurse Communication: Mobility status PT Visit Diagnosis: Muscle weakness (generalized)  (M62.81);Difficulty in walking, not elsewhere classified (R26.2);Other abnormalities of gait and mobility (R26.89);Unsteadiness on feet (R26.81);Hemiplegia and hemiparesis Hemiplegia - Right/Left: Right Hemiplegia - caused by: Cerebral infarction     Time: 6578-4696 PT Time Calculation (min) (ACUTE ONLY): 29 min  Charges:  $Therapeutic Activity: 23-37 mins                     Lillia Pauls, PT, DPT Acute Rehabilitation Services Office [phone]    Norval Morton 03/26/2023, 12:52 PM

## 2023-03-26 NOTE — Progress Notes (Cosign Needed Addendum)
Name: Brian Rios DOB: 1981/06/05  Please be advised that the above-named patient will require a short-term nursing home stay -- anticipated 30 days or less for rehabilitation and strengthening. The plan is for return home.

## 2023-03-26 NOTE — Progress Notes (Addendum)
STROKE TEAM PROGRESS NOTE   INTERVAL HISTORY Patient is seen in his room with no family at the bedside.  He remains hemodynamic stable, and his neurological exam is stable.  He did have an episode of agitation yesterday in which he took off his telemetry monitor and pulled out his IVs and attempted to leave the hospital, but was ultimately able to be redirected.  Seroquel as needed given for agitation.  Patient still intermittently declines to wear telemetry monitor.  He is awaiting a CIR bed. Vitals:   03/25/23 2055 03/25/23 2342 03/26/23 0414 03/26/23 0813  BP: (!) 123/111 (!) 159/88 (!) 136/96 115/69  Pulse: 95 89 85 96  Resp: 18 17 17 17   Temp: 99.1 F (37.3 C) 99.3 F (37.4 C) 99.1 F (37.3 C) 97.6 F (36.4 C)  TempSrc: Oral Oral  Oral  SpO2: 94% 97% 93% 99%   CBC:  Recent Labs  Lab 03/21/23 1518 03/21/23 2047 03/25/23 1009 03/26/23 0639  WBC 13.4*   < > 12.3* 11.6*  NEUTROABS 10.0*  --   --   --   HGB 14.4   < > 14.0 13.0  HCT 44.2   < > 42.8 39.5  MCV 84.7   < > 84.4 83.7  PLT 232   < > 240 237   < > = values in this interval not displayed.    Basic Metabolic Panel:  Recent Labs  Lab 03/23/23 0404 03/24/23 0351 03/25/23 1009 03/26/23 0639  NA 138   < > 138 140  K 3.4*   < > 3.6 3.8  CL 107   < > 104 106  CO2 23   < > 22 22  GLUCOSE 97   < > 120* 86  BUN <5*   < > 5* 6  CREATININE 0.95   < > 1.07 1.10  CALCIUM 8.1*   < > 8.9 8.7*  MG 1.7  --   --   --    < > = values in this interval not displayed.    Lipid Panel:  Recent Labs  Lab 03/22/23 0443  CHOL 113  TRIG 109  HDL 37*  CHOLHDL 3.1  VLDL 22  LDLCALC 54    HgbA1c:  Recent Labs  Lab 03/23/23 0404  HGBA1C 5.6    Urine Drug Screen:  Recent Labs  Lab 03/22/23 0107  LABOPIA NONE DETECTED  COCAINSCRNUR POSITIVE*  LABBENZ NONE DETECTED  AMPHETMU NONE DETECTED  THCU POSITIVE*  LABBARB NONE DETECTED     Alcohol Level  Recent Labs  Lab 03/21/23 1518  ETH <10     IMAGING past  24 hours No results found.  PHYSICAL EXAM  General: Alert, well-nourished, well-developed middle-aged African-American male in no acute distress Respiratory: Regular, unlabored respirations Neurological: Awake alert and interactive.  Severe expressive aphasia with word finding difficulties and word hesitancy.  Pupils equal round and reactive, but disconjugate gaze, OS palsy, with inability  to cross midline.  He is able to intermittently follow commands.  Patient  is able to repeat  occasional simple simple words.  He is unable to name objects and can only say "yes", "no" or "all right".  His receptive aphasia appears to be better today with patient able to understand more of what is spoken to him and able to point to different pictures when asked.  Severe Right facial droop noted, 5 out of 5 strength in left upper extremity and left lower extremity, 3 out of 5 strength in upper  right upper extremity  ASSESSMENT/PLAN Mr. Brian Rios is a 42 y.o. male with history of depression, EtOH abuse, polysubstance abuse, embolic stroke in 2021, hyperlipidemia, schizophrenia and tobacco abuse presenting with acute onset right-sided weakness, right facial droop and dysarthria.  CTA revealed occlusion of 2 proximal left M2 branches, and patient was brought here for mechanical thrombectomy.  TICI 2C revascularization was achieved.  This morning, patient has persistent transcortical aphasia as well as right-sided weakness.  Repeat MRI reveals large left MCA stroke.  Stroke:  left MCA territory stroke due to left M2 occlusion s/p IR with TICI2c but reoccluded, likely large vessel disease from uncontrolled risk factors and cocaine use Code Stroke CT head likely acute infarct in posterior left parietal lobe ASPECTS 9 CTA head & neck occlusion of 2 left proximal M2 branches, left M1 stenosis MRI 5/30: Patchy acute left MCA infarct superimposed on chronic infarct S/p IR with left M2 TICI2c reperfusion MRI 5/31  interval increase in size of the acute infarct involving the majority of the left and insular cortex and posterior left frontal operculum extending into high posterior left parietal lobe MRA previously occluded left M2 segment has again occluded  2D Echo EF 60-65% LDL 54 HgbA1c 5.7 UDS positive for cocaine and THC VTE prophylaxis - lovenox aspirin 325 mg daily prior to admission, now on aspirin 81 mg daily and Brilinta (ticagrelor) 90 mg bid.  Therapy recommendations: CIR Disposition: Pending  Hx of stroke 06/2020 left BG, left MCA, MCA/PCA and left MCA/ACA scattered infarcts, MRA head and neck negative, TEE neg, EF 50-55%, no PFO, UDS neg, LDL 200 and A1C 6.0, discharged on DAPT and lipitor 80  Hypertension Home meds: None Stable Lisinopril 10 mg daily BP < 180/105 Long-term BP goal normotensive  Hyperlipidemia Home meds: Atorvastatin 40 mg daily, resumed in hospital LDL 54, goal < 70 Continue statin at discharge  Fever Leukocytosis Tmax 100.7->afebrile-> continue low-grade fevers WBC 13.4->14.7->12.0-> 12.3 Chest x-ray reveals right lower lobe pneumonia Start Zosyn per pharmacy, transitioned to Unasyn  Dysphagia  Passed swallow, on nectar thick liquid Barium swallow, awaiting results to advance diet Gentle hydration  Avoid aspiration  Cocaine abuse UDS positive for cocaine Cessation education will be provided  Tobacco abuse Current smoker Smoking cessation counseling will be provided Nicotine patch ordered  Agitation Patient had an episode of agitation on 6/3 when he pulled out his IVs, removed his telemetry monitor and attempted to leave the hospital but was ultimately able to be redirected As needed Seroquel 50 mg 3 times daily Suspect that patient's agitation will improve as his aphasia improves and he is better able to communicate  Other Stroke Risk Factors ETOH use, alcohol level <10, advised to drink no more than 1-2 drink(s) a day THC abuse - UDS:  THC  POSITIVE Patient will be advised to stop using due to stroke risk.  Other Active Problems Schizophrenia-continue home Abilify and Ingrezza Hypokalemia K 3.4 ->3.3 -> 3.6, now normal, supplement PRN  Hospital day # 5  Brian Rios , MSN, AGACNP-BC [facility] See Amion for schedule and pager information 03/26/2023 12:36 PM   I have personally obtained history,examined this patient, reviewed notes, independently viewed imaging studies, participated in medical decision making and plan of care.ROS completed by me personally and pertinent positives fully documented  I have made any additions or clarifications directly to the above note. Agree with note above.  Patient was intermittently agitated and has been started on Seroquel.  Continue redirection  and Seroquel.  Medically stable for transfer to rehab when bed available.  Brian Heady, MD Medical Director [facility] Stroke Center Pager: [phone] 03/26/2023 2:00 PM  To contact Stroke Continuity provider, please refer to [email or web address]. After hours, contact General Neurology

## 2023-03-26 NOTE — [place] FL2 (Addendum)
[place] MEDICAID FL2 LEVEL OF CARE FORM     IDENTIFICATION  Patient Name: Jaivan Ure Birthdate: 08/27/1981 Sex: male Admission Date (Current Location): 03/21/2023  [facility] and [place] Number:  Chiropodist and Address:  [facility], [address] [place], [place] [address]      Provider Number: [id number]  Attending Physician Name and Address:  Stroke, Md, MD  Relative Name and Phone Number:       Current Level of Care: Hospital Recommended Level of Care: Skilled Nursing Facility Prior Approval Number:    Date Approved/Denied:   PASRR Number: Pending  Discharge Plan: SNF    Current Diagnoses: Patient Active Problem List   Diagnosis Date Noted   Stroke (cerebrum) (HCC) 03/21/2023   Middle cerebral artery embolism, 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   Follow-up exam 10/25/2022   Hospital discharge follow-up 09/20/2022   Small bowel mass 09/04/2022   S/P right colectomy 09/04/2022   Dental decay 06/12/2022   Chest pain 05/17/2022   Abnormal CT scan 12/28/2021   Elevated blood pressure reading 07/26/2021   Erectile dysfunction 07/26/2021   H/O elevated lipids 03/28/2021   Embolic stroke (HCC) 07/18/2020   Stroke (HCC) 07/17/2020   Drug abuse (HCC) 07/17/2020   Major depressive disorder, recurrent episode, severe (HCC) 02/20/2015   Alcohol use disorder, severe, dependence (HCC) 02/20/2015   Stimulant use disorder 02/20/2015   Tobacco use disorder 02/20/2015    Orientation RESPIRATION BLADDER Height & Weight     Self, Time, Situation, Place  Normal Continent Weight:   Height:     BEHAVIORAL SYMPTOMS/MOOD NEUROLOGICAL BOWEL NUTRITION STATUS      Continent Diet (regular)  AMBULATORY STATUS COMMUNICATION OF NEEDS Skin   Limited Assist Verbally Normal                       Personal Care Assistance Level of Assistance  Bathing, Feeding, Dressing Bathing Assistance: Limited assistance Feeding assistance: Limited  assistance Dressing Assistance: Limited assistance     Functional Limitations Info  Speech     Speech Info: Impaired (dysarthria)    SPECIAL CARE FACTORS FREQUENCY  PT (By licensed PT), OT (By licensed OT), Speech therapy     PT Frequency: 5x/wk OT Frequency: 5x/wk     Speech Therapy Frequency: 5x/wk      Contractures Contractures Info: Not present    Additional Factors Info  Code Status, Allergies, Psychotropic Code Status Info: Full Allergies Info: NKA Psychotropic Info: Abilify 30mg  daily at bed; Depakote ER 750mg  at bed; Remeron 45mg  at bed; Seroquel 50mg  3x/day PRN         Current Medications (03/26/2023):  This is the current hospital active medication list Current Facility-Administered Medications  Medication Dose Route Frequency Provider Last Rate Last Admin   acetaminophen (TYLENOL) tablet 650 mg  650 mg Oral Q4H PRN Julieanne Cotton, MD   650 mg at 03/24/23 2001   Or   acetaminophen (TYLENOL) 160 MG/5ML solution 650 mg  650 mg Per Tube Q4H PRN Deveshwar, Simonne Maffucci, MD       Or   acetaminophen (TYLENOL) suppository 650 mg  650 mg Rectal Q4H PRN Deveshwar, Sanjeev, MD       Ampicillin-Sulbactam (UNASYN) 3 g in sodium chloride 0.9 % 100 mL IVPB  3 g Intravenous Q6H Mosetta Anis, RPH 200 mL/hr at 03/26/23 0404 3 g at 03/26/23 0404   ARIPiprazole (ABILIFY) tablet 30 mg  30 mg Oral QHS Caryl Pina, MD  30 mg at 03/25/23 2218   aspirin chewable tablet 81 mg  81 mg Oral Daily Deveshwar, Simonne Maffucci, MD   81 mg at 03/26/23 0820   atorvastatin (LIPITOR) tablet 40 mg  40 mg Oral Daily Caryl Pina, MD   40 mg at 03/26/23 0820   benztropine (COGENTIN) tablet 1 mg  1 mg Oral QHS Caryl Pina, MD   1 mg at 03/25/23 2218   divalproex (DEPAKOTE ER) 24 hr tablet 750 mg  750 mg Oral QHS de Saintclair Halsted, Cortney E, NP   750 mg at 03/25/23 2217   enoxaparin (LOVENOX) injection 40 mg  40 mg Subcutaneous Q24H Marvel Plan, MD   40 mg at 03/25/23 2212   escitalopram (LEXAPRO) tablet  20 mg  20 mg Oral Daily Caryl Pina, MD   20 mg at 03/26/23 0820   lisinopril (ZESTRIL) tablet 10 mg  10 mg Oral Daily Gerline Legacy, PA-C   10 mg at 03/26/23 0820   mirtazapine (REMERON) tablet 45 mg  45 mg Oral QHS Caryl Pina, MD   45 mg at 03/25/23 2211   nicotine (NICODERM CQ - dosed in mg/24 hours) patch 21 mg  21 mg Transdermal Daily de Saintclair Halsted, Cortney E, NP   21 mg at 03/26/23 0820   nicotine polacrilex (NICORETTE) gum 2 mg  2 mg Oral PRN de Saintclair Halsted, Cortney E, NP       Oral care mouth rinse  15 mL Mouth Rinse BID Harolyn Rutherford, MD   15 mL at 03/26/23 4782   QUEtiapine (SEROQUEL) tablet 50 mg  50 mg Oral TID PRN de Saintclair Halsted, Cortney E, NP   50 mg at 03/26/23 0820   senna-docusate (Senokot-S) tablet 1 tablet  1 tablet Oral QHS PRN Caryl Pina, MD       ticagrelor Marden Noble) tablet 90 mg  90 mg Oral BID Julieanne Cotton, MD   90 mg at 03/26/23 0820     Discharge Medications: Please see discharge summary for a list of discharge medications.  Relevant Imaging Results:  Relevant Lab Results:   Additional Information SS#: [id number]  Baldemar Lenis, LCSW  I have personally obtained history,examined this patient, reviewed notes, independently viewed imaging studies, participated in medical decision making and plan of care.ROS completed by me personally and pertinent positives fully documented  I have made any additions or clarifications directly to the above note. Agree with note above.    Delia Heady, MD Medical Director [facility] Stroke Center Pager: [phone] 03/26/2023 12:55 PM

## 2023-03-26 NOTE — TOC Progression Note (Addendum)
Transition of Care [facility]) - Progression Note    Patient Details  Name: Brian Rios MRN: [id number] Date of Birth: [birth year]  [facility] CM/SW Contact  Baldemar Lenis, [place] Phone Number: 03/26/2023, 10:17 AM  Clinical Narrative:   CSW updated by Rehab Admissions that patient's mother would prefer SNF over CIR, if possible, with [facility] as first choice due to patient's grandmother being there and it is closer to home. CSW discussed with Rehab Admissions that SNF is not likely, and CIR will follow to admit if needed. CSW completed referral and sent, asked [facility] to review. CSW to follow.  CSW acknowledging consult for substance abuse. Resources for outpatient treatment added to patient's AVS.  UPDATE: [facility] has declined to offer. Patient's mother also asking about [facility], they have also declined. CSW sent referral to the [place] area, awaiting any other possible responses. CSW to follow.  UPDATE: Patient has received denials from all SNF in the [place] area. CSW spoke with patient's mom, Lupita Leash, do provide update and discuss barriers to SNF placement. Lupita Leash indicated understanding, she is still in agreement with CIR as SNF is not an option. CSW updated rehab admissions. CSW to follow.  Expected Discharge Plan: IP Rehab Facility Barriers to Discharge: Continued Medical Work up, Active Substance Use - Placement, Inadequate or no insurance, Awaiting state approval Financial controller)  Expected Discharge Plan and Services   Discharge Planning Services: CM Consult   Living arrangements for the past 2 months: Single Family Home                                       Social Determinants of Health (SDOH) Interventions SDOH Screenings   Food Insecurity: Food Insecurity Present (06/12/2022)  Housing: High Risk (06/12/2022)  Transportation Needs: Unmet Transportation Needs (06/12/2022)  Alcohol Screen: Low Risk  (03/08/2021)   Depression (PHQ2-9): High Risk (06/12/2022)  Tobacco Use: High Risk (03/23/2023)    Readmission Risk Interventions     No data to display

## 2023-03-26 NOTE — Progress Notes (Signed)
Patient agreed to be on cardiac monitor overnight but refused for it this morning.No signs of acute distress, denies any chest pain or SOB.  Explained to patient the importance of cardiac monitor but patient still refused. Care ongoing.

## 2023-03-27 ENCOUNTER — Encounter (HOSPITAL_COMMUNITY): Payer: Self-pay | Admitting: Physical Medicine and Rehabilitation

## 2023-03-27 ENCOUNTER — Inpatient Hospital Stay (HOSPITAL_COMMUNITY)
Admission: RE | Admit: 2023-03-27 | Discharge: 2023-04-10 | DRG: 056 | Disposition: A | Discharge status: Home / Self Care | Payer: Medicaid Other | Source: Intra-hospital | Attending: Physical Medicine and Rehabilitation | Admitting: Physical Medicine and Rehabilitation

## 2023-03-27 ENCOUNTER — Other Ambulatory Visit: Payer: Self-pay

## 2023-03-27 ENCOUNTER — Encounter (HOSPITAL_COMMUNITY): Payer: Self-pay | Admitting: Neurology

## 2023-03-27 DIAGNOSIS — R4587 Impulsiveness: Secondary | ICD-10-CM | POA: Diagnosis present

## 2023-03-27 DIAGNOSIS — I69391 Dysphagia following cerebral infarction: Secondary | ICD-10-CM | POA: Diagnosis not present

## 2023-03-27 DIAGNOSIS — I69351 Hemiplegia and hemiparesis following cerebral infarction affecting right dominant side: Secondary | ICD-10-CM | POA: Diagnosis not present

## 2023-03-27 DIAGNOSIS — R197 Diarrhea, unspecified: Secondary | ICD-10-CM | POA: Diagnosis not present

## 2023-03-27 DIAGNOSIS — Z7984 Long term (current) use of oral hypoglycemic drugs: Secondary | ICD-10-CM

## 2023-03-27 DIAGNOSIS — I69320 Aphasia following cerebral infarction: Principal | ICD-10-CM

## 2023-03-27 DIAGNOSIS — I69390 Apraxia following cerebral infarction: Secondary | ICD-10-CM | POA: Diagnosis not present

## 2023-03-27 DIAGNOSIS — Z8639 Personal history of other endocrine, nutritional and metabolic disease: Secondary | ICD-10-CM

## 2023-03-27 DIAGNOSIS — G479 Sleep disorder, unspecified: Secondary | ICD-10-CM | POA: Diagnosis present

## 2023-03-27 DIAGNOSIS — F3341 Major depressive disorder, recurrent, in partial remission: Secondary | ICD-10-CM | POA: Diagnosis present

## 2023-03-27 DIAGNOSIS — Z8249 Family history of ischemic heart disease and other diseases of the circulatory system: Secondary | ICD-10-CM

## 2023-03-27 DIAGNOSIS — I6602 Occlusion and stenosis of left middle cerebral artery: Secondary | ICD-10-CM

## 2023-03-27 DIAGNOSIS — Z7982 Long term (current) use of aspirin: Secondary | ICD-10-CM

## 2023-03-27 DIAGNOSIS — Z811 Family history of alcohol abuse and dependence: Secondary | ICD-10-CM

## 2023-03-27 DIAGNOSIS — I69312 Visuospatial deficit and spatial neglect following cerebral infarction: Secondary | ICD-10-CM | POA: Diagnosis not present

## 2023-03-27 DIAGNOSIS — Z79899 Other long term (current) drug therapy: Secondary | ICD-10-CM | POA: Diagnosis not present

## 2023-03-27 DIAGNOSIS — Z6372 Alcoholism and drug addiction in family: Secondary | ICD-10-CM

## 2023-03-27 DIAGNOSIS — R252 Cramp and spasm: Secondary | ICD-10-CM | POA: Diagnosis present

## 2023-03-27 DIAGNOSIS — Z7902 Long term (current) use of antithrombotics/antiplatelets: Secondary | ICD-10-CM | POA: Diagnosis not present

## 2023-03-27 DIAGNOSIS — I69393 Ataxia following cerebral infarction: Secondary | ICD-10-CM | POA: Diagnosis not present

## 2023-03-27 DIAGNOSIS — I63512 Cerebral infarction due to unspecified occlusion or stenosis of left middle cerebral artery: Secondary | ICD-10-CM | POA: Diagnosis present

## 2023-03-27 DIAGNOSIS — I69322 Dysarthria following cerebral infarction: Secondary | ICD-10-CM | POA: Diagnosis not present

## 2023-03-27 DIAGNOSIS — I10 Essential (primary) hypertension: Secondary | ICD-10-CM | POA: Diagnosis present

## 2023-03-27 DIAGNOSIS — F17210 Nicotine dependence, cigarettes, uncomplicated: Secondary | ICD-10-CM | POA: Diagnosis present

## 2023-03-27 DIAGNOSIS — J189 Pneumonia, unspecified organism: Secondary | ICD-10-CM | POA: Diagnosis present

## 2023-03-27 DIAGNOSIS — I69398 Other sequelae of cerebral infarction: Secondary | ICD-10-CM | POA: Diagnosis not present

## 2023-03-27 DIAGNOSIS — I63032 Cerebral infarction due to thrombosis of left carotid artery: Secondary | ICD-10-CM

## 2023-03-27 DIAGNOSIS — F209 Schizophrenia, unspecified: Secondary | ICD-10-CM | POA: Diagnosis present

## 2023-03-27 DIAGNOSIS — Z803 Family history of malignant neoplasm of breast: Secondary | ICD-10-CM

## 2023-03-27 DIAGNOSIS — I69392 Facial weakness following cerebral infarction: Secondary | ICD-10-CM

## 2023-03-27 DIAGNOSIS — F1490 Cocaine use, unspecified, uncomplicated: Secondary | ICD-10-CM | POA: Diagnosis present

## 2023-03-27 DIAGNOSIS — Z833 Family history of diabetes mellitus: Secondary | ICD-10-CM

## 2023-03-27 DIAGNOSIS — E785 Hyperlipidemia, unspecified: Secondary | ICD-10-CM | POA: Diagnosis present

## 2023-03-27 DIAGNOSIS — B372 Candidiasis of skin and nail: Secondary | ICD-10-CM | POA: Diagnosis not present

## 2023-03-27 DIAGNOSIS — R159 Full incontinence of feces: Secondary | ICD-10-CM | POA: Diagnosis not present

## 2023-03-27 DIAGNOSIS — Z83438 Family history of other disorder of lipoprotein metabolism and other lipidemia: Secondary | ICD-10-CM

## 2023-03-27 DIAGNOSIS — Z9049 Acquired absence of other specified parts of digestive tract: Secondary | ICD-10-CM

## 2023-03-27 LAB — GLUCOSE, CAPILLARY
Glucose-Capillary: 81 mg/dL (ref 70–99)
Glucose-Capillary: 95 mg/dL (ref 70–99)
Glucose-Capillary: 112 mg/dL — ABNORMAL HIGH (ref 70–99)
Glucose-Capillary: 126 mg/dL — ABNORMAL HIGH (ref 70–99)

## 2023-03-27 MED ORDER — ASPIRIN 81 MG PO CHEW: 81.0000 mg | CHEWABLE_TABLET | Freq: Every day | ORAL | Status: DC

## 2023-03-27 MED ORDER — ACETAMINOPHEN 325 MG PO TABS
650.0000 mg | ORAL_TABLET | ORAL | Status: DC | PRN
  Administered 2023-03-31: 650 mg via ORAL
  Filled 2023-03-27: qty 2

## 2023-03-27 MED ORDER — LISINOPRIL 10 MG PO TABS: 10.0000 mg | ORAL_TABLET | Freq: Every day | ORAL | Status: DC

## 2023-03-27 MED ORDER — ASPIRIN 81 MG PO CHEW
81.0000 mg | CHEWABLE_TABLET | Freq: Every day | ORAL | Status: DC
  Administered 2023-03-28 – 2023-04-10 (×14): 81 mg via ORAL
  Filled 2023-03-27 (×14): qty 1

## 2023-03-27 MED ORDER — ACETAMINOPHEN 160 MG/5ML PO SOLN: 650.0000 mg | ORAL | Status: DC | PRN

## 2023-03-27 MED ORDER — NICOTINE 21 MG/24HR TD PT24: 21.0000 mg | MEDICATED_PATCH | Freq: Every day | TRANSDERMAL | 0 refills | Status: DC

## 2023-03-27 MED ORDER — MIRTAZAPINE 15 MG PO TABS
45.0000 mg | ORAL_TABLET | Freq: Every day | ORAL | Status: DC
  Administered 2023-03-27 – 2023-04-09 (×14): 45 mg via ORAL
  Filled 2023-03-27 (×14): qty 3

## 2023-03-27 MED ORDER — ENOXAPARIN SODIUM 40 MG/0.4ML IJ SOSY
40.0000 mg | PREFILLED_SYRINGE | INTRAMUSCULAR | Status: DC
  Administered 2023-03-27 – 2023-04-09 (×14): 40 mg via SUBCUTANEOUS
  Filled 2023-03-27 (×14): qty 0.4

## 2023-03-27 MED ORDER — ORAL CARE MOUTH RINSE
15.0000 mL | Freq: Two times a day (BID) | OROMUCOSAL | Status: DC
  Administered 2023-03-27 – 2023-04-10 (×21): 15 mL via OROMUCOSAL

## 2023-03-27 MED ORDER — QUETIAPINE FUMARATE 50 MG PO TABS
50.0000 mg | ORAL_TABLET | Freq: Three times a day (TID) | ORAL | Status: DC | PRN
  Administered 2023-03-28: 50 mg via ORAL
  Filled 2023-03-27: qty 1

## 2023-03-27 MED ORDER — DIVALPROEX SODIUM ER 500 MG PO TB24
750.0000 mg | ORAL_TABLET | Freq: Every day | ORAL | Status: DC
  Administered 2023-03-27 – 2023-04-09 (×14): 750 mg via ORAL
  Filled 2023-03-27 (×14): qty 1

## 2023-03-27 MED ORDER — NICOTINE POLACRILEX 2 MG MT GUM: 2.0000 mg | CHEWING_GUM | OROMUCOSAL | Status: DC | PRN

## 2023-03-27 MED ORDER — NICOTINE 21 MG/24HR TD PT24
21.0000 mg | MEDICATED_PATCH | Freq: Every day | TRANSDERMAL | Status: DC
  Administered 2023-03-28 – 2023-04-09 (×13): 21 mg via TRANSDERMAL
  Filled 2023-03-27 (×14): qty 1

## 2023-03-27 MED ORDER — SENNOSIDES-DOCUSATE SODIUM 8.6-50 MG PO TABS: 1.0000 | ORAL_TABLET | Freq: Every evening | ORAL | Status: DC | PRN

## 2023-03-27 MED ORDER — ENOXAPARIN SODIUM 40 MG/0.4ML IJ SOSY: 40.0000 mg | PREFILLED_SYRINGE | INTRAMUSCULAR | Status: DC

## 2023-03-27 MED ORDER — ARIPIPRAZOLE 30 MG PO TABS: 30.0000 mg | ORAL_TABLET | Freq: Every day | ORAL | Status: DC

## 2023-03-27 MED ORDER — DIVALPROEX SODIUM ER 250 MG PO TB24: 750.0000 mg | ORAL_TABLET | Freq: Every day | ORAL | Status: DC

## 2023-03-27 MED ORDER — ACETAMINOPHEN 650 MG RE SUPP: 650.0000 mg | RECTAL | Status: DC | PRN

## 2023-03-27 MED ORDER — ARIPIPRAZOLE 10 MG PO TABS
30.0000 mg | ORAL_TABLET | Freq: Every day | ORAL | Status: DC
  Administered 2023-03-27 – 2023-04-09 (×14): 30 mg via ORAL
  Filled 2023-03-27 (×14): qty 3

## 2023-03-27 MED ORDER — NICOTINE POLACRILEX 2 MG MT GUM: 2.0000 mg | CHEWING_GUM | OROMUCOSAL | 0 refills | Status: DC | PRN

## 2023-03-27 MED ORDER — BENZTROPINE MESYLATE 1 MG PO TABS: 1.0000 mg | ORAL_TABLET | Freq: Every day | ORAL | Status: DC

## 2023-03-27 MED ORDER — MIRTAZAPINE 45 MG PO TABS: 45.0000 mg | ORAL_TABLET | Freq: Every day | ORAL | Status: DC

## 2023-03-27 MED ORDER — TICAGRELOR 90 MG PO TABS: 90.0000 mg | ORAL_TABLET | Freq: Two times a day (BID) | ORAL | Status: DC

## 2023-03-27 MED ORDER — BENZTROPINE MESYLATE 1 MG PO TABS
1.0000 mg | ORAL_TABLET | Freq: Every day | ORAL | Status: DC
  Administered 2023-03-27 – 2023-04-09 (×14): 1 mg via ORAL
  Filled 2023-03-27 (×14): qty 1

## 2023-03-27 MED ORDER — LISINOPRIL 10 MG PO TABS
10.0000 mg | ORAL_TABLET | Freq: Every day | ORAL | Status: DC
  Administered 2023-03-28 – 2023-03-30 (×3): 10 mg via ORAL
  Filled 2023-03-27 (×3): qty 1

## 2023-03-27 MED ORDER — QUETIAPINE FUMARATE 50 MG PO TABS: 50.0000 mg | ORAL_TABLET | Freq: Three times a day (TID) | ORAL | Status: DC | PRN

## 2023-03-27 MED ORDER — TICAGRELOR 90 MG PO TABS
90.0000 mg | ORAL_TABLET | Freq: Two times a day (BID) | ORAL | Status: DC
  Administered 2023-03-27 – 2023-04-10 (×28): 90 mg via ORAL
  Filled 2023-03-27 (×28): qty 1

## 2023-03-27 MED ORDER — ESCITALOPRAM OXALATE 10 MG PO TABS
20.0000 mg | ORAL_TABLET | Freq: Every day | ORAL | Status: DC
  Administered 2023-03-28 – 2023-04-10 (×14): 20 mg via ORAL
  Filled 2023-03-27 (×14): qty 2

## 2023-03-27 MED ORDER — ATORVASTATIN CALCIUM 40 MG PO TABS
40.0000 mg | ORAL_TABLET | Freq: Every day | ORAL | Status: DC
  Administered 2023-03-28 – 2023-04-10 (×14): 40 mg via ORAL
  Filled 2023-03-27 (×14): qty 1

## 2023-03-27 MED ORDER — SODIUM CHLORIDE 0.9 % IV SOLN
3.0000 g | Freq: Four times a day (QID) | INTRAVENOUS | Status: AC
  Administered 2023-03-27 – 2023-03-28 (×5): 3 g via INTRAVENOUS
  Filled 2023-03-27 (×5): qty 8

## 2023-03-27 NOTE — Progress Notes (Signed)
INPATIENT REHABILITATION ADMISSION NOTE   Arrival Method:wheelchair     Mental Orientation:alert , aphasic and walking   Assessment:done   Skin:done   IV'S: SL LT F/A   Pain:none   Tubes and Drains:none   Safety Measures:done   Vital Signs: done  Height and Weight:done   Rehab Orientation:done ; pt aphasic   Family: none    Notes: Received a call from [facility] RN 3W that patient had a recent fall before he was transferred by [employer] to [address].

## 2023-03-27 NOTE — H&P (Signed)
Physical Medicine and Rehabilitation Admission H&P     HPI: Brian Rios is a 42 year old right-handed male with history of  alcohol/tobacco use, left basal ganglia embolic stroke in 2021, schizophrenia maintained on Abilify, Depakote, Lexapro as well as Cogentin, hyperlipidemia, 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  Presented 03/21/2023 to [facility] with acute onset of right-sided weakness facial droop and aphasia.  Cranial CT scan worrisome for acute infarct in the posterior left parietal lobe.  CTA showed occlusion of 2 proximal left M2 branches and mild left M1 stenosis.  Widely patent carotid arteries.  Admission chemistries unremarkable except potassium 3.2, glucose 127, WBC 13,400, hemoglobin A1c 5.6, alcohol negative, urine drug screen positive cocaine as well as marijuana.  Patient underwent arteriogram with revascularization of occluded left MCA however it reoccluded.  MRI of the brain showed interval increase in size of acute/subacute nonhemorrhagic infarct involving the majority the left insular cortex and posterior left frontal operculum extending into the high posterior left parietal lobe.  No acute hemorrhage or significant mass effect.  Echocardiogram ejection fraction of 60 to 65% no wall motion abnormalities.  Patient placed on low-dose aspirin as well as Brilinta for CVA prophylaxis.  Lovenox was added for DVT prophylaxis.  Hospital course complicated by bouts of agitation and restlessness and patient did make 1 attempt to leave the hospital but was ultimately able to be redirected.  Spiked a low-grade fever 100.7 WBC increasing to 14,700 chest x-ray revealed right lower lobe pneumonia initially placed on Zosyn transition to Unasyn.  Maintained on mechanical soft diet with thin liquids.  Therapy evaluations completed due to patient's decreased functional ability right-sided weakness and aphasia was admitted for a comprehensive rehab program.  Review of Systems  Unable to  perform ROS: Acuity of condition   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   left cerebrum   Erectile dysfunction    Heart murmur    Hyperlipidemia    Schizophrenia (HCC)    Tobacco abuse    Past Surgical History:  Procedure Laterality Date   BOWEL RESECTION N/A 09/04/2022   Procedure: SMALL BOWEL RESECTION, open;  Surgeon: Leafy Ro, MD;  Location: [facility] ORS;  Service: General;  Laterality: N/A;   IR CT HEAD LTD  03/21/2023   IR CT HEAD LTD  03/21/2023   IR PERCUTANEOUS ART THROMBECTOMY/INFUSION INTRACRANIAL INC DIAG ANGIO  03/21/2023   ORIF ORBITAL FRACTURE  1985   PARTIAL COLECTOMY Right 09/04/2022   Procedure: PARTIAL COLECTOMY;  Surgeon: Leafy Ro, MD;  Location: [facility] ORS;  Service: General;  Laterality: Right;  Right colectimy   RADIOLOGY WITH ANESTHESIA N/A 03/21/2023   Procedure: RADIOLOGY WITH ANESTHESIA;  Surgeon: Radiologist, Medication, MD;  Location: [facility] OR;  Service: Radiology;  Laterality: N/A;   TEE WITHOUT CARDIOVERSION N/A 07/19/2020   Procedure: TRANSESOPHAGEAL ECHOCARDIOGRAM (TEE);  Surgeon: Lamar Blinks, MD;  Location: [facility] ORS;  Service: Cardiovascular;  Laterality: N/A;   WRIST SURGERY Left 11/07/2013   REPAIR ULNAR ARTERY, NERVE AND TENDON   Family History  Problem Relation Age of Onset   Alcoholism Mother    Hypertension Mother    Hyperlipidemia Mother    Breast cancer Mother    Alcoholism Father    Diabetes Maternal Grandmother    Social History:  reports that he has been smoking cigarettes. He has a 15.00 pack-year smoking  history. He has been exposed to tobacco smoke. He quit smokeless tobacco use about 20 years ago. He reports current alcohol use of about 6.0 standard drinks of alcohol per week. He reports current drug use. Frequency: 7.00 times per week. Drugs: "Crack" cocaine and Marijuana. Allergies: No Known Allergies Medications  Prior to Admission  Medication Sig Dispense Refill   acetaminophen (TYLENOL) 500 MG tablet Take 1,000 mg by mouth as needed for moderate pain.     ARIPiprazole (ABILIFY) 30 MG tablet Take 1 tablet (30 mg total) by mouth at bedtime. 30 tablet 1   aspirin 325 MG tablet Take by mouth.     benztropine (COGENTIN) 1 MG tablet Take 1 tablet (1 mg total) by mouth at bedtime. 30 tablet 1   divalproex (DEPAKOTE ER) 250 MG 24 hr tablet Take 3 tablets (750 mg total) by mouth at bedtime. 90 tablet 1   escitalopram (LEXAPRO) 20 MG tablet Take 1 tablet (20 mg total) by mouth daily. 30 tablet 1   mirtazapine (REMERON) 45 MG tablet Take 1 tablet (45 mg total) by mouth at bedtime. 30 tablet 1   ARIPiprazole ER (ABILIFY MAINTENA) 400 MG PRSY prefilled syringe Inject 400 mg into the muscle every 30 (thirty) days. (Patient not taking: Reported on 03/22/2023)     atorvastatin (LIPITOR) 40 MG tablet Take 1 tablet (40 mg total) by mouth daily. Must keep OV with Lanora Manis, NP on 10/25/22 prior to additional refills. (Patient not taking: Reported on 03/22/2023) 30 tablet 0   benztropine (COGENTIN) 0.5 MG tablet Take 2 tablets by mouth at beddtime (Patient not taking: Reported on 03/22/2023) 60 tablet 2   hydrOXYzine (ATARAX) 25 MG tablet Take 3 tablets (75 mg total) by mouth at bedtime as needed. (Patient not taking: Reported on 03/22/2023) 90 tablet 1   loperamide (IMODIUM) 2 MG capsule Take 2 capsules (4 mg total) by mouth 3 (three) times daily as needed for diarrhea or loose stools. (Patient not taking: Reported on 03/22/2023) 180 capsule 1   metFORMIN (GLUCOPHAGE-XR) 500 MG 24 hr tablet Take 1 tablet (500 mg total) by mouth daily with breakfast. (Patient not taking: Reported on 03/22/2023) 30 tablet 2   tadalafil (CIALIS) 5 MG tablet Take 2-4 tablets (10-20 mg total) by mouth once daily as needed for erectile dysfunction. (Patient not taking: Reported on 03/22/2023) 30 tablet 11   valbenazine (INGREZZA) 80 MG capsule Take one  capsule by mouth at bedtime. (Patient not taking: Reported on 03/22/2023) 30 capsule 1   valbenazine (INGREZZA) 80 MG capsule Take one capsule by mouth at bedtime. 30 capsule 1      Home: Home Living Family/patient expects to be discharged to:: Private residence Additional Comments: pt presents with impiared communication   Functional History: Prior Function Prior Level of Function : Independent/Modified Independent, Patient poor historian/Family not available Mobility Comments: anticipate no AD ADLs Comments: anticipate indep  Functional Status:  Mobility: Bed Mobility Overal bed mobility: Needs Assistance Bed Mobility: Supine to Sit Supine to sit: Supervision General bed mobility comments: OOB in chair Transfers Overall transfer level: Needs assistance Equipment used: 1 person hand held assist Transfers: Sit to/from Stand Sit to Stand: Min guard General transfer comment: Min guard for safety due to impulsivity Ambulation/Gait Ambulation/Gait assistance: Min assist Gait Distance (Feet): 100 Feet (100", 100") Assistive device: None Gait Pattern/deviations: Step-through pattern, Decreased stance time - right, Decreased dorsiflexion - right, Decreased weight shift to right, Wide base of support, Decreased step length - right General Gait Details:  Improved right step length today with continued mild external rotation during midstance, minA for dynamic stability. Gait velocity: decreased Gait velocity interpretation: <1.31 ft/sec, indicative of household ambulator Stairs: Yes Stairs assistance: Min guard Stair Management: Two rails Number of Stairs: 5    ADL: ADL Overall ADL's : Needs assistance/impaired Eating/Feeding: Maximal assistance, Sitting Grooming: Moderate assistance, Sitting Upper Body Bathing: Moderate assistance, Sitting Lower Body Bathing: Maximal assistance, +2 for safety/equipment, Sit to/from stand Upper Body Dressing : Moderate assistance,  Sitting Lower Body Dressing: Maximal assistance, +2 for safety/equipment, Sit to/from stand Toilet Transfer: Moderate assistance, +2 for physical assistance, +2 for safety/equipment, Ambulation, Regular Toilet Toileting- Clothing Manipulation and Hygiene: Maximal assistance, Sit to/from stand, +2 for safety/equipment Functional mobility during ADLs: Moderate assistance, +2 for safety/equipment General ADL Comments: limited by R weakness, communication, safety, L inattention  Cognition: Cognition Overall Cognitive Status: Difficult to assess Arousal/Alertness: Awake/alert Orientation Level: Oriented X4 Awareness: Impaired Awareness Impairment: Emergent impairment, Anticipatory impairment Problem Solving: Impaired Problem Solving Impairment: Functional basic Cognition Arousal/Alertness: Awake/alert Behavior During Therapy: Impulsive Overall Cognitive Status: Difficult to assess General Comments: follows most simple commands, more difficulty with multistep directions. Pt attempting to communicate with head nodds and "yes/no" verbalizations, unsure of accuracy. Noted impulsivity and limited safety awareness throughout. Able to utilize communication board for more consistent "yes" and "no" responses. Pt having difficulty locating music app on phone, requires assist Difficult to assess due to: Impaired communication  Physical Exam: Blood pressure 126/84, pulse 80, temperature 98.2 F (36.8 C), temperature source Oral, resp. rate 18, SpO2 98 %. Physical Exam Gen: no distress, normal appearing HEENT: oral mucosa pink and moist, NCAT Cardio: Reg rate Chest: normal effort, normal rate of breathing Abd: soft, non-distended Ext: no edema Psych: pleasant, normal affect Skin: intact Neurological:     Comments: Patient is alert.  Somewhat anxious.  Severe expressive aphasia.     Results for orders placed or performed during the hospital encounter of 03/21/23 (from the past 48 hour(s))   Basic metabolic panel     Status: Abnormal   Collection Time: 03/25/23 10:09 AM  Result Value Ref Range   Sodium 138 135 - 145 mmol/L   Potassium 3.6 3.5 - 5.1 mmol/L   Chloride 104 98 - 111 mmol/L   CO2 22 22 - 32 mmol/L   Glucose, Bld 120 (H) 70 - 99 mg/dL    Comment: Glucose reference range applies only to samples taken after fasting for at least 8 hours.   BUN 5 (L) 6 - 20 mg/dL   Creatinine, Ser 2.95 0.61 - 1.24 mg/dL   Calcium 8.9 8.9 - 62.1 mg/dL   GFR, Estimated >30 >86 mL/min    Comment: (NOTE) Calculated using the CKD-EPI Creatinine Equation (2021)    Anion gap 12 5 - 15    Comment: Performed at [facility], [address] [place], [place] [address]  CBC     Status: Abnormal   Collection Time: 03/25/23 10:09 AM  Result Value Ref Range   WBC 12.3 (H) 4.0 - 10.5 K/uL   RBC 5.07 4.22 - 5.81 MIL/uL   Hemoglobin 14.0 13.0 - 17.0 g/dL   HCT 96.2 95.2 - 84.1 %   MCV 84.4 80.0 - 100.0 fL   MCH 27.6 26.0 - 34.0 pg   MCHC 32.7 30.0 - 36.0 g/dL   RDW 32.4 40.1 - 02.7 %   Platelets 240 150 - 400 K/uL   nRBC 0.0 0.0 - 0.2 %  Comment: Performed at [facility], [address] [place], [place] [address]  Glucose, capillary     Status: Abnormal   Collection Time: 03/25/23 11:15 AM  Result Value Ref Range   Glucose-Capillary 102 (H) 70 - 99 mg/dL    Comment: Glucose reference range applies only to samples taken after fasting for at least 8 hours.  Glucose, capillary     Status: None   Collection Time: 03/25/23  5:24 PM  Result Value Ref Range   Glucose-Capillary 97 70 - 99 mg/dL    Comment: Glucose reference range applies only to samples taken after fasting for at least 8 hours.  Glucose, capillary     Status: Abnormal   Collection Time: 03/25/23  8:58 PM  Result Value Ref Range   Glucose-Capillary 120 (H) 70 - 99 mg/dL    Comment: Glucose reference range applies only to samples taken after fasting for at least 8 hours.   Comment 1 Notify RN    Comment 2  Document in Chart   Glucose, capillary     Status: None   Collection Time: 03/26/23  6:32 AM  Result Value Ref Range   Glucose-Capillary 83 70 - 99 mg/dL    Comment: Glucose reference range applies only to samples taken after fasting for at least 8 hours.   Comment 1 Notify RN    Comment 2 Document in Chart   Basic metabolic panel     Status: Abnormal   Collection Time: 03/26/23  6:39 AM  Result Value Ref Range   Sodium 140 135 - 145 mmol/L   Potassium 3.8 3.5 - 5.1 mmol/L   Chloride 106 98 - 111 mmol/L   CO2 22 22 - 32 mmol/L   Glucose, Bld 86 70 - 99 mg/dL    Comment: Glucose reference range applies only to samples taken after fasting for at least 8 hours.   BUN 6 6 - 20 mg/dL   Creatinine, Ser 6.04 0.61 - 1.24 mg/dL   Calcium 8.7 (L) 8.9 - 10.3 mg/dL   GFR, Estimated >54 >09 mL/min    Comment: (NOTE) Calculated using the CKD-EPI Creatinine Equation (2021)    Anion gap 12 5 - 15    Comment: Performed at [facility], [address] [place], [place] [address]  CBC     Status: Abnormal   Collection Time: 03/26/23  6:39 AM  Result Value Ref Range   WBC 11.6 (H) 4.0 - 10.5 K/uL   RBC 4.72 4.22 - 5.81 MIL/uL   Hemoglobin 13.0 13.0 - 17.0 g/dL   HCT 47.8 29.5 - 62.1 %   MCV 83.7 80.0 - 100.0 fL   MCH 27.5 26.0 - 34.0 pg   MCHC 32.9 30.0 - 36.0 g/dL   RDW 30.8 65.7 - 84.6 %   Platelets 237 150 - 400 K/uL   nRBC 0.0 0.0 - 0.2 %    Comment: Performed at [facility], [address] [place], [place] [address]  Glucose, capillary     Status: None   Collection Time: 03/26/23 12:54 PM  Result Value Ref Range   Glucose-Capillary 91 70 - 99 mg/dL    Comment: Glucose reference range applies only to samples taken after fasting for at least 8 hours.   Comment 1 Notify RN    Comment 2 Document in Chart   Glucose, capillary     Status: None   Collection Time: 03/26/23  4:44 PM  Result Value Ref Range  Glucose-Capillary 97 70 - 99 mg/dL    Comment: Glucose reference  range applies only to samples taken after fasting for at least 8 hours.   Comment 1 Notify RN    Comment 2 Document in Chart   Glucose, capillary     Status: None   Collection Time: 03/27/23  6:28 AM  Result Value Ref Range   Glucose-Capillary 81 70 - 99 mg/dL    Comment: Glucose reference range applies only to samples taken after fasting for at least 8 hours.   Comment 1 Notify RN    Comment 2 Document in Chart    No results found.    Blood pressure 126/84, pulse 80, temperature 98.2 F (36.8 C), temperature source Oral, resp. rate 18, SpO2 98 %.  Medical Problem List and Plan: 1. 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  -patient may shower  -ELOS/Goals: 5-7 days 2.  Antithrombotics: -DVT/anticoagulation:  Pharmaceutical: Lovenox  -antiplatelet therapy: Aspirin 81 mg daily and Brilinta 90 mg twice daily 3. Pain Management: Tylenol as needed 4. Mood/Behavior/Sleep: Cogentin 1 mg nightly, Depakote 750 mg nightly, Lexapro 20 mg daily, Remeron 45 milligrams nightly  -antipsychotic agents: Abilify 30 mg nightly, Seroquel 50 mg 3 times daily as needed 5. Neuropsych/cognition: This patient is not capable of making decisions on his own behalf. 6. Skin/Wound Care: Routine skin checks 7. Fluids/Electrolytes/Nutrition: Routine in and outs with follow-up chemistries 8.  Hypertension.  Lisinopril 10 mg daily.  Monitor with increased mobility  9.  History of alcohol tobacco and drug use.  Urine drug screen positive cocaine as well as marijuana.  Continue NicoDerm patch.  Provide counseling  10.  Right lower lobe pneumonia.  Complete course of Unasyn.  Follow-up speech therapy 11.  Hyperlipidemia. 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  Additionally, I have reviewed and concur with the physician assistant's documentation above.  Sula Soda, MD  Mcarthur Rossetti Angiulli, PA-C 03/27/2023

## 2023-03-27 NOTE — Progress Notes (Signed)
   03/27/23 1425  What Happened  Was fall witnessed? Yes  Who witnessed fall? Tasha Nurse manager  Patients activity before fall other (comment) (Patient was sitting on the side of the bed trying to put his pants on.)  Point of contact other (comment) (Right knee)  Was patient injured? No  Provider Notification  Provider Name/Title Elmer Picker NP  Date Provider Notified 03/27/23  Time Provider Notified 1425  Method of Notification  (secure chat)  Notification Reason Fall  Provider response No new orders (MD stated thanks for letting her know.)  Date of Provider Response 03/27/23  Time of Provider Response 1457  Follow Up  Family notified Yes - comment  Time family notified 1456  Additional tests No  Simple treatment Other (comment) (none)  Progress note created (see row info) Yes  Adult Fall Risk Assessment  Risk Factor Category (scoring not indicated) High fall risk per protocol (document High fall risk)  Age 22  Fall History: Fall within 6 months prior to admission 0  Elimination; Bowel and/or Urine Incontinence 0  Elimination; Bowel and/or Urine Urgency/Frequency 0  Medications: includes PCA/Opiates, Anti-convulsants, Anti-hypertensives, Diuretics, Hypnotics, Laxatives, Sedatives, and Psychotropics 0  Patient Care Equipment 1  Mobility-Assistance 2  Mobility-Gait 2  Mobility-Sensory Deficit 0  Altered awareness of immediate physical environment 0  Impulsiveness 2  Lack of understanding of one's physical/cognitive limitations 0  Total Score 7  Adult Fall Risk Interventions  Required Bundle Interventions *See Row Information* High fall risk - low, moderate, and high requirements implemented  Additional Interventions Use of appropriate toileting equipment (bedpan, BSC, etc.)  Screening for Fall Injury Risk (To be completed on HIGH fall risk patients) - Assessing Need for Floor Mats  Risk For Fall Injury- Criteria for Floor Mats None identified - No additional  interventions needed  Will Implement Floor Mats Yes

## 2023-03-27 NOTE — Progress Notes (Signed)
Nurse manager was making rounds. I went in to assist the patient with putting his pants on. I was putting my gloves on and he leaned forward causing him to roll to floor onto his right knee.  Patient Alert and oriented, denies pain, no injury noted MD and Family called.

## 2023-03-27 NOTE — H&P (Signed)
Physical Medicine and Rehabilitation Admission H&P   CC: Left MCA CVA  HPI: Brian Rios is a 42 year old right-handed male with history of  alcohol/tobacco use, left basal ganglia embolic stroke in 2021, schizophrenia maintained on Abilify, Depakote, Lexapro as well as Cogentin, hyperlipidemia, 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  Presented 03/21/2023 to [facility] with acute onset of right-sided weakness facial droop and aphasia.  Cranial CT scan worrisome for acute infarct in the posterior left parietal lobe.  CTA showed occlusion of 2 proximal left M2 branches and mild left M1 stenosis.  Widely patent carotid arteries.  Admission chemistries unremarkable except potassium 3.2, glucose 127, WBC 13,400, hemoglobin A1c 5.6, alcohol negative, urine drug screen positive cocaine as well as marijuana.  Patient underwent arteriogram with revascularization of occluded left MCA however it reoccluded.  MRI of the brain showed interval increase in size of acute/subacute nonhemorrhagic infarct involving the majority the left insular cortex and posterior left frontal operculum extending into the high posterior left parietal lobe.  No acute hemorrhage or significant mass effect.  Echocardiogram ejection fraction of 60 to 65% no wall motion abnormalities.  Patient placed on low-dose aspirin as well as Brilinta for CVA prophylaxis.  Lovenox was added for DVT prophylaxis.  Hospital course complicated by bouts of agitation and restlessness and patient did make 1 attempt to leave the hospital but was ultimately able to be redirected.  Spiked a low-grade fever 100.7 WBC increasing to 14,700 chest x-ray revealed right lower lobe pneumonia initially placed on Zosyn transition to Unasyn.  Maintained on mechanical soft diet with thin liquids.  Therapy evaluations completed due to patient's decreased functional ability right-sided weakness and aphasia was admitted for a comprehensive rehab program.  Review of Systems   Unable to perform ROS: Acuity of condition   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   left cerebrum   Erectile dysfunction    Heart murmur    Hyperlipidemia    Schizophrenia (HCC)    Tobacco abuse    Past Surgical History:  Procedure Laterality Date   BOWEL RESECTION N/A 09/04/2022   Procedure: SMALL BOWEL RESECTION, open;  Surgeon: Leafy Ro, MD;  Location: [facility] ORS;  Service: General;  Laterality: N/A;   IR CT HEAD LTD  03/21/2023   IR CT HEAD LTD  03/21/2023   IR PERCUTANEOUS ART THROMBECTOMY/INFUSION INTRACRANIAL INC DIAG ANGIO  03/21/2023   ORIF ORBITAL FRACTURE  1985   PARTIAL COLECTOMY Right 09/04/2022   Procedure: PARTIAL COLECTOMY;  Surgeon: Leafy Ro, MD;  Location: [facility] ORS;  Service: General;  Laterality: Right;  Right colectimy   RADIOLOGY WITH ANESTHESIA N/A 03/21/2023   Procedure: RADIOLOGY WITH ANESTHESIA;  Surgeon: Radiologist, Medication, MD;  Location: [facility] OR;  Service: Radiology;  Laterality: N/A;   TEE WITHOUT CARDIOVERSION N/A 07/19/2020   Procedure: TRANSESOPHAGEAL ECHOCARDIOGRAM (TEE);  Surgeon: Lamar Blinks, MD;  Location: [facility] ORS;  Service: Cardiovascular;  Laterality: N/A;   WRIST SURGERY Left 11/07/2013   REPAIR ULNAR ARTERY, NERVE AND TENDON   Family History  Problem Relation Age of Onset   Alcoholism Mother    Hypertension Mother    Hyperlipidemia Mother    Breast cancer Mother    Alcoholism Father    Diabetes Maternal Grandmother    Social History:  reports that he has been smoking cigarettes. He has a  15.00 pack-year smoking history. He has been exposed to tobacco smoke. He quit smokeless tobacco use about 20 years ago. He reports current alcohol use of about 6.0 standard drinks of alcohol per week. He reports current drug use. Frequency: 7.00 times per week. Drugs: "Crack" cocaine and Marijuana. Allergies: No Known  Allergies Medications Prior to Admission  Medication Sig Dispense Refill   ARIPiprazole (ABILIFY) 30 MG tablet Take 1 tablet (30 mg total) by mouth at bedtime.     [START ON 03/28/2023] aspirin 81 MG chewable tablet Chew 1 tablet (81 mg total) by mouth daily.     atorvastatin (LIPITOR) 40 MG tablet Take 1 tablet (40 mg total) by mouth daily. Must keep OV with Lanora Manis, NP on 10/25/22 prior to additional refills. (Patient not taking: Reported on 03/22/2023) 30 tablet 0   benztropine (COGENTIN) 1 MG tablet Take 1 tablet (1 mg total) by mouth at bedtime.     divalproex (DEPAKOTE ER) 250 MG 24 hr tablet Take 3 tablets (750 mg total) by mouth at bedtime.     enoxaparin (LOVENOX) 40 MG/0.4ML injection Inject 0.4 mLs (40 mg total) into the skin daily. 0 mL    escitalopram (LEXAPRO) 20 MG tablet Take 1 tablet (20 mg total) by mouth daily. 30 tablet 1   [START ON 03/28/2023] lisinopril (ZESTRIL) 10 MG tablet Take 1 tablet (10 mg total) by mouth daily.     loperamide (IMODIUM) 2 MG capsule Take 2 capsules (4 mg total) by mouth 3 (three) times daily as needed for diarrhea or loose stools. (Patient not taking: Reported on 03/22/2023) 180 capsule 1   metFORMIN (GLUCOPHAGE-XR) 500 MG 24 hr tablet Take 1 tablet (500 mg total) by mouth daily with breakfast. (Patient not taking: Reported on 03/22/2023) 30 tablet 2   mirtazapine (REMERON) 45 MG tablet Take 1 tablet (45 mg total) by mouth at bedtime.     [START ON 03/28/2023] nicotine (NICODERM CQ - DOSED IN MG/24 HOURS) 21 mg/24hr patch Place 1 patch (21 mg total) onto the skin daily. 28 patch 0   nicotine polacrilex (NICORETTE) 2 MG gum Take 1 each (2 mg total) by mouth as needed for smoking cessation. 100 tablet 0   QUEtiapine (SEROQUEL) 50 MG tablet Take 1 tablet (50 mg total) by mouth 3 (three) times daily as needed (agitation).     senna-docusate (SENOKOT-S) 8.6-50 MG tablet Take 1 tablet by mouth at bedtime as needed for mild constipation.     ticagrelor (BRILINTA) 90  MG TABS tablet Take 1 tablet (90 mg total) by mouth 2 (two) times daily. 60 tablet    Home: Home Living Family/patient expects to be discharged to:: Private residence Additional Comments: pt presents with impiared communication   Functional History: Prior Function Prior Level of Function : Independent/Modified Independent, Patient poor historian/Family not available Mobility Comments: anticipate no AD ADLs Comments: anticipate indep   Functional Status:  Mobility: Bed Mobility Overal bed mobility: Needs Assistance Bed Mobility: Supine to Sit Supine to sit: Supervision General bed mobility comments: OOB in chair Transfers Overall transfer level: Needs assistance Equipment used: 1 person hand held assist Transfers: Sit to/from Stand Sit to Stand: Min guard General transfer comment: Min guard for safety due to impulsivity Ambulation/Gait Ambulation/Gait assistance: Min assist Gait Distance (Feet): 100 Feet (100", 100") Assistive device: None Gait Pattern/deviations: Step-through pattern, Decreased stance time - right, Decreased dorsiflexion - right, Decreased weight shift to right, Wide base of support, Decreased step length - right General Gait Details: Improved  right step length today with continued mild external rotation during midstance, minA for dynamic stability. Gait velocity: decreased Gait velocity interpretation: <1.31 ft/sec, indicative of household ambulator Stairs: Yes Stairs assistance: Min guard Stair Management: Two rails Number of Stairs: 5   ADL: ADL Overall ADL's : Needs assistance/impaired Eating/Feeding: Maximal assistance, Sitting Grooming: Moderate assistance, Sitting Upper Body Bathing: Moderate assistance, Sitting Lower Body Bathing: Maximal assistance, +2 for safety/equipment, Sit to/from stand Upper Body Dressing : Moderate assistance, Sitting Lower Body Dressing: Maximal assistance, +2 for safety/equipment, Sit to/from stand Toilet Transfer:  Moderate assistance, +2 for physical assistance, +2 for safety/equipment, Ambulation, Regular Toilet Toileting- Clothing Manipulation and Hygiene: Maximal assistance, Sit to/from stand, +2 for safety/equipment Functional mobility during ADLs: Moderate assistance, +2 for safety/equipment General ADL Comments: limited by R weakness, communication, safety, L inattention   Cognition: Cognition Overall Cognitive Status: Difficult to assess Arousal/Alertness: Awake/alert Orientation Level: Oriented X4 Awareness: Impaired Awareness Impairment: Emergent impairment, Anticipatory impairment Problem Solving: Impaired Problem Solving Impairment: Functional basic Cognition Arousal/Alertness: Awake/alert Behavior During Therapy: Impulsive Overall Cognitive Status: Difficult to assess General Comments: follows most simple commands, more difficulty with multistep directions. Pt attempting to communicate with head nodds and "yes/no" verbalizations, unsure of accuracy. Noted impulsivity and limited safety awareness throughout. Able to utilize communication board for more consistent "yes" and "no" responses. Pt having difficulty locating music app on phone, requires assist Difficult to assess due to: Impaired communication  Physical Exam: Blood pressure 128/89, pulse 88, temperature 98.6 F (37 C), temperature source Oral, resp. rate 18, height 5\' 10"  (1.778 m), weight 93.7 kg, SpO2 98 %. Physical Exam Gen: no distress, normal appearing HEENT: oral mucosa pink and moist, NCAT Cardio: Reg rate Chest: normal effort, normal rate of breathing Abd: soft, non-distended Ext: no edema Psych: pleasant, normal affect Skin: intact Neurological:     Comments: Patient is alert.  Somewhat anxious.  Severe expressive aphasia. 3/5 strength in RUE, otherwise 5/5 strength. +verbal apraxia. Mild expressive >receptive aphasia. Right facial droop. Following commands     Results for orders placed or performed during  the hospital encounter of 03/27/23 (from the past 48 hour(s))  Glucose, capillary     Status: Abnormal   Collection Time: 03/27/23  4:40 PM  Result Value Ref Range   Glucose-Capillary 112 (H) 70 - 99 mg/dL    Comment: Glucose reference range applies only to samples taken after fasting for at least 8 hours.   No results found.    Blood pressure 128/89, pulse 88, temperature 98.6 F (37 C), temperature source Oral, resp. rate 18, height 5\' 10"  (1.778 m), weight 93.7 kg, SpO2 98 %.  Medical Problem List and Plan: 1. 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  -patient may shower  -ELOS/Goals: 5-7 days 2.  Antithrombotics: -DVT/anticoagulation:  Pharmaceutical: Lovenox  -antiplatelet therapy: Aspirin 81 mg daily and Brilinta 90 mg twice daily 3. Pain Management: Tylenol as needed 4. Mood/Behavior/Sleep: Cogentin 1 mg nightly, Depakote 750 mg nightly, Lexapro 20 mg daily, Remeron 45 milligrams nightly  -antipsychotic agents: Abilify 30 mg nightly, Seroquel 50 mg 3 times daily as needed 5. Neuropsych/cognition: This patient is not capable of making decisions on his own behalf. 6. Skin/Wound Care: Routine skin checks 7. Fluids/Electrolytes/Nutrition: Routine in and outs with follow-up chemistries 8.  Hypertension.  Lisinopril 10 mg daily.  Monitor with increased  mobility  9.  History of alcohol tobacco and drug use.  Urine drug screen positive cocaine as well as marijuana.  Continue NicoDerm patch.  Provide counseling  10.  Right lower lobe pneumonia.  Complete course of Unasyn.  Follow-up speech therapy  11.  Hyperlipidemia. Continue Lipitor  12. Verbal Apraxia: continue SLP   I have personally performed a face to face diagnostic evaluation, including, but not limited to relevant history and physical exam  findings, of this patient and developed relevant assessment and plan.  Additionally, I have reviewed and concur with the physician assistant's documentation above.  Mcarthur Rossetti Angiulli, PA-C   Horton Chin, MD 03/27/2023

## 2023-03-27 NOTE — Progress Notes (Signed)
Inpatient Rehabilitation Admission Medication Review by a Pharmacist  A complete drug regimen review was completed for this patient to identify any potential clinically significant medication issues.  High Risk Drug Classes Is patient taking? Indication by Medication  Antipsychotic Yes Abilify-schizophrenia, Seroquel prn agitation  Anticoagulant Yes Lovenox - VTE ppx  Antibiotic Yes, as an intravenous medication Unasyn - aspiration pneumonia  Opioid No   Antiplatelet Yes Brilinta, Aspirin - CVA, s/p stent  Hypoglycemics/insulin No   Vasoactive Medication Yes Lisinopril - HTN  Chemotherapy No   Other Yes Cogentin, Depakote, Lexapro, Remeron - mood  Atorvastatin - HLD     Type of Medication Issue Identified Description of Issue Recommendation(s)  Drug Interaction(s) (clinically significant)     Duplicate Therapy     Allergy     No Medication Administration End Date     Incorrect Dose     Additional Drug Therapy Needed     Significant med changes from prior encounter (inform family/care partners about these prior to discharge).    Other       Clinically significant medication issues were identified that warrant physician communication and completion of prescribed/recommended actions by midnight of the next day:  No  Time spent performing this drug regimen review (minutes):  20 minutes  Thank you Okey Regal, PharmD

## 2023-03-27 NOTE — Progress Notes (Signed)
Speech Language Pathology Treatment: Dysphagia;Cognitive-Linquistic  Patient Details Name: Brian Rios MRN: [id number] DOB: 1980/12/21 Today's Date: 03/27/2023 Time: 4098-1191 SLP Time Calculation (min) (ACUTE ONLY): 32 min  Assessment / Plan / Recommendation Clinical Impression  SWALLOWING Pt seen for ongoing dysphagia management.  His diet has been changed to dysphagia 2 (chopped/ground).  He is safe pharyngeally for up to a regular diet.  Pt fed himself regular solid, puree, and thin liquid by cups.  There was mild anterior spillage from R side with cup sips.  Pt needed verbal cue to take single sips, but persisted with single sips for remainder of session after reminder. There was mild residue on R side of oral cavity.  Pt benefited from cue for lingual sweep.  Alternating solids and liquids also appeared beneficial. Pt was alternating solids and purees independently which likely helped with oral clearance  Recommend mechanical soft solids with thin liquids.  COMMUNICATION Pt continues to present with expressive > receptive language impairments. Apraxia seems to be a driving factor in impairments, there is likely some expressive aphasia present as well.    Receptive language: pt answered yes/no questions with 13/16 accuracy overall.  Pt benefited from verbal cues to check surroundings for immediate environment questions, improving to 15/16 accuracy.  Pt answered complex comparison/sequencing questions with 80% accuracy independently.  With object identification from field of 2 pt performed with 90% accuracy independently.  Pt followed 1 step commands with 3/3 accuracy with repetition x1, and 2 step commands with 1 of 3 accuracy.  With 2 step commands pt appeared to have difficulty with motor planning for sequence of steps over comprehension and pt endorsed same.    Expressive language: Pt participated in automatic speech tasks with choral recitation for counting and days of the week.   Pt was able to join in after 2-3 examples.  Pt counted 3-5 and 4-10 with speech therapist.  With days of week Pt began at Wednesday or Thursday and was able to continue sequence with SLP fading support.  For repetition task, pt was most successful with single syllable words. With single syllable word with initial consonant cluster, pt dropped second sound in cluster.  Pt was unable to repeat 2 syllable words with max effort, despite visual, verbal and tactile cuing.  With naming task for common objects and single syllable words, pt was 60% accuracy.  For objects pt could not name ("spoon", "phone/cellphone"), he demonstrated their use.  Pt would benefit from intensive speech therapy in inpatient rehab to address dysphagia and aphasia/apraxia.   HPI HPI: 42 year old male presenting with new onset of right sided weakness, right facial droop and dysphasia secondary to occlusion of two proximal left M2 branches. Patchy acute left MCA infarcts superimposed on chronic infarcts.      SLP Plan  Continue with current plan of care      Recommendations for follow up therapy are one component of a multi-disciplinary discharge planning process, led by the attending physician.  Recommendations may be updated based on patient status, additional functional criteria and insurance authorization.    Recommendations  Diet recommendations: Dysphagia 3 (mechanical soft);Thin liquid Liquids provided via: Cup;Straw (single sips) Medication Administration: Whole meds with liquid Supervision: Patient able to self feed;Full supervision/cueing for compensatory strategies Compensations: Slow rate;Small sips/bites;Follow solids with liquid (Single bites/sips) Postural Changes and/or Swallow Maneuvers: Seated upright 90 degrees        Oral care BID   Intermittent Supervision/Assistance Dysphagia, oropharyngeal phase (R13.12);Apraxia (R48.2);Aphasia (R47.01)  Continue with current plan of care     Kerrie Pleasure, MA, CCC-SLP Acute Rehabilitation Services Office: [phone] 03/27/2023, 10:24 AM

## 2023-03-27 NOTE — Progress Notes (Signed)
PMR Admission Coordinator Pre-Admission Assessment  Patient: Brian Rios is an 42 y.o., male MRN: [id number] DOB: 04/16/1981 Height:   Weight:    Insurance Information HMO:     PPO:      PCP:      IPA:      80/20:      OTHER:  PRIMARY: Medicaid [employer] Access      Policy#: [id number]      Subscriber: pt CM Name:       Phone#:      Fax#:  Pre-Cert#:       Employer:  Benefits:  Phone #:      Name:  Eff. Date: as of 03/25/23     Deduct:       Out of Pocket Max:       Life Max:  CIR: 100%      SNF:  Outpatient:      Co-Pay:  Home Health:       Co-Pay:  DME:      Co-Pay:  Providers:  SECONDARY:       Policy#:      Phone#:   Financial Counselor:       Phone#:   The "Data Collection Information Summary" for patients in Inpatient Rehabilitation Facilities with attached "Privacy Act Statement-Health Care Records" was provided and verbally reviewed with: N/A  Emergency Contact Information Contact Information     Name Relation Home Work Mobile   Leigh,Donna J Mother   [phone]       Current Medical History  Patient Admitting Diagnosis: CVA   History of Present Illness: Pt is a 42 y/o male with PMH of 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  CTA revealed occlusion of 2 proximal left M2 branches.  Pt underwent thrombectomy at [place] on 5/30 for revascularization.  Pt has persistent transcortical aphasia and R hemiparesis.  Repeat MRI 5/31 reveals large L MCA stroke, likely caused by large vessel disease from uncontrolled risk factors and cocaine use.  Repeat MRA showed reocclusion of left M2 segment.  Echo WNL, HgbA1c 5.7.  Stroke service recommend DAPT with Brilinta and asa.  Therapy evaluations completed and pt was recommended for CIR.   Complete NIHSS TOTAL:  16  Patient's medical record from [place] has been reviewed by the rehabilitation admission coordinator and physician.  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 No  Family History   family history includes Alcoholism in his father and mother; Breast cancer in his mother; Diabetes in his maternal grandmother; Hyperlipidemia in his mother; Hypertension in his mother.  Current Medications  Current Facility-Administered Medications:    acetaminophen (TYLENOL) tablet 650 mg, 650 mg, Oral, Q4H PRN, 650 mg at 03/24/23 2001 **OR** acetaminophen (TYLENOL) 160 MG/5ML solution 650 mg, 650 mg, Per Tube, Q4H PRN **OR** acetaminophen (TYLENOL) suppository 650 mg, 650 mg, Rectal, Q4H PRN, Deveshwar, Sanjeev, MD   Ampicillin-Sulbactam (UNASYN) 3   g in sodium chloride 0.9 % 100 mL IVPB, 3 g, Intravenous, Q6H, Bitonti, Michael T, RPH, Last Rate: 200 mL/hr at 03/26/23 0404, 3 g at 03/26/23 0404   ARIPiprazole (ABILIFY) tablet 30 mg, 30 mg, Oral, QHS, Lindzen, Eric, MD, 30 mg at 03/25/23 2218   aspirin chewable tablet 81 mg, 81 mg, Oral, Daily, 81 mg at 03/26/23 0820 **OR** [DISCONTINUED] aspirin chewable tablet 81 mg, 81 mg, Per Tube, Daily, Deveshwar, Sanjeev, MD   atorvastatin (LIPITOR) tablet 40 mg, 40 mg, Oral, Daily, Lindzen, Eric, MD, 40 mg at 03/26/23 0820   benztropine (COGENTIN) tablet 1 mg, 1 mg, Oral, QHS, Lindzen, Eric, MD, 1 mg at 03/25/23 2218   divalproex (DEPAKOTE ER) 24 hr tablet 750 mg, 750 mg, Oral, QHS, de La Torre, Cortney E, NP, 750 mg at 03/25/23 2217   enoxaparin (LOVENOX) injection 40 mg, 40 mg, Subcutaneous, Q24H, Xu, Jindong, MD, 40 mg at 03/25/23 2212    escitalopram (LEXAPRO) tablet 20 mg, 20 mg, Oral, Daily, Lindzen, Eric, MD, 20 mg at 03/26/23 0820   lisinopril (ZESTRIL) tablet 10 mg, 10 mg, Oral, Daily, Spoon, Jeffrey J, PA-C, 10 mg at 03/26/23 0820   mirtazapine (REMERON) tablet 45 mg, 45 mg, Oral, QHS, Lindzen, Eric, MD, 45 mg at 03/25/23 2211   nicotine (NICODERM CQ - dosed in mg/24 hours) patch 21 mg, 21 mg, Transdermal, Daily, de La Torre, Cortney E, NP, 21 mg at 03/26/23 0820   nicotine polacrilex (NICORETTE) gum 2 mg, 2 mg, Oral, PRN, de La Torre, Cortney E, NP   Oral care mouth rinse, 15 mL, Mouth Rinse, BID, Palikh, Gaurang M, MD, 15 mL at 03/26/23 0821   QUEtiapine (SEROQUEL) tablet 50 mg, 50 mg, Oral, TID PRN, de La Torre, Cortney E, NP, 50 mg at 03/26/23 0820   senna-docusate (Senokot-S) tablet 1 tablet, 1 tablet, Oral, QHS PRN, Lindzen, Eric, MD   ticagrelor (BRILINTA) tablet 90 mg, 90 mg, Oral, BID, 90 mg at 03/26/23 0820 **OR** [DISCONTINUED] ticagrelor (BRILINTA) tablet 90 mg, 90 mg, Per Tube, BID, Deveshwar, Sanjeev, MD  Patients Current Diet:  Diet Order             Diet regular Room service appropriate? Yes; Fluid consistency: Thin  Diet effective now                   Precautions / Restrictions Precautions Precautions: Fall Restrictions Weight Bearing Restrictions: No   Has the patient had 2 or more falls or a fall with injury in the past year? 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 Independent  Indoor Mobility: Did the patient need assistance with walking from room to room (with or without device)? Independent  Stairs: Did the patient need assistance with internal or external stairs (with or without device)? Independent  Functional Cognition: Did the patient need help  planning regular tasks such as shopping or remembering to take medications? Independent  Patient Information Are you of Hispanic, Latino/a,or Spanish origin?: X. Patient unable to respond, A. No, not of Hispanic, Latino/a, or Spanish origin (per chart) What is your race?: X. Patient unable to respond, B. Black or African American (chart) Do you need or want an interpreter to communicate with a doctor or health care staff?: 9. Unable to respond  Patient's Response To:    Health Literacy and Transportation Is the patient able to respond to health literacy and transportation needs?: No  Home Assistive Devices / Equipment    Prior Device Use: Indicate devices/aids used by the patient prior to current illness, exacerbation or injury? None of the above  Current Functional Level Cognition  Arousal/Alertness: Awake/alert Overall Cognitive Status: Difficult to assess Difficult to assess due to: Impaired communication Orientation Level: Oriented X4 General Comments: follows most simple commands, more difficulty with multistep directions. Pt attempting to communicate with head nodds and "yes/no" verbalizations, unsure of accuracy. Noted impulsivity and limited safety awareness throughout Awareness: Impaired Awareness Impairment: Emergent impairment, Anticipatory impairment Problem Solving: Impaired Problem Solving Impairment: Functional basic    Extremity Assessment (includes Sensation/Coordination)  Upper Extremity Assessment: Defer to OT evaluation RUE Deficits / Details: moving against gravity proximally, no activation at wrist or hand. shoulder and elbow MMT 2/5, full PROM. edematous. reponds to noxious stim RUE Sensation: decreased light touch, decreased proprioception RUE Coordination: decreased fine motor, decreased gross motor  Lower Extremity Assessment: RLE deficits/detail, LLE deficits/detail, Difficult to assess due to impaired cognition RLE Deficits / Details: grossly 3/5 with  manual cues needed to engage resistance testing for hip ext, knee flex/ext, and dorsi/plantarflexion RLE Sensation: decreased proprioception RLE Coordination: decreased fine motor, decreased gross motor LLE Deficits / Details: WFL's for strength, cognition limited pt's understanding for MMT    ADLs  Overall ADL's : Needs assistance/impaired Eating/Feeding: Maximal assistance, Sitting Grooming: Moderate assistance, Sitting Upper Body Bathing: Moderate assistance, Sitting Lower Body Bathing: Maximal assistance, +2 for safety/equipment, Sit to/from stand Upper Body Dressing : Moderate assistance, Sitting Lower Body Dressing: Maximal assistance, +2 for safety/equipment, Sit to/from stand Toilet Transfer: Moderate assistance, +2 for physical assistance, +2 for safety/equipment, Ambulation, Regular Toilet Toileting- Clothing Manipulation and Hygiene: Maximal assistance, Sit to/from stand, +2 for safety/equipment Functional mobility during ADLs: Moderate assistance, +2 for safety/equipment General ADL Comments: limited by R weakness, communication, safety, L inattention    Mobility  Overal bed mobility: Needs Assistance Bed Mobility: Supine to Sit Supine to sit: Supervision General bed mobility comments: Exiting towards right side of bed, no physical assist required    Transfers  Overall transfer level: Needs assistance Equipment used: 1 person hand held assist Transfers: Sit to/from Stand Sit to Stand: Min guard, +2 safety/equipment General transfer comment: Good power up to stand, min guard for safety. Mildly impulsive but able to be redirected easily    Ambulation / Gait / Stairs / Wheelchair Mobility  Ambulation/Gait Ambulation/Gait assistance: Min assist, +2 safety/equipment Gait Distance (Feet): 100 Feet (100", 100") Assistive device: 1 person hand held assist, None Gait Pattern/deviations: Step-through pattern, Decreased stance time - right, Decreased dorsiflexion - right,  Decreased weight shift to right, Wide base of support, Decreased step length - right General Gait Details: Verbal cueing for RLE neutral positioning and larger step length Gait velocity: decreased Gait velocity interpretation: <1.31 ft/sec, indicative of household ambulator Stairs: Yes Stairs assistance: Min guard Stair Management: Two rails Number of Stairs: 10    Posture / Balance Dynamic Sitting Balance Sitting balance - Comments: R lateral lean Balance Overall balance assessment: Needs assistance Sitting-balance support: Feet supported Sitting balance-Leahy Scale: Good Sitting balance - Comments: R lateral lean Postural control: Right lateral lean Standing balance support: No upper extremity supported, During functional activity Standing balance-Leahy Scale: Fair Standing balance comment: R lateral lean mod to correct    Special needs/care consideration Behavioral consideration Schizophrenia, bipolar, MDD   Previous Home Environment (from   acute therapy documentation) Additional Comments: pt presents with impiared communication  Discharge Living Setting Plans for Discharge Living Setting: Lives with (comment) (mom) Type of Home at Discharge: House Discharge Home Layout: One level Discharge Home Access: Level entry (through back door) Discharge Bathroom Shower/Tub: Tub/shower unit Discharge Bathroom Toilet: Standard Discharge Bathroom Accessibility: Yes How Accessible: Accessible via walker Does the patient have any problems obtaining your medications?: No  Social/Family/Support Systems Anticipated Caregiver: mom, Donna Leigh Anticipated Caregiver's Contact Information: [phone] Ability/Limitations of Caregiver: supervision/min assist for ADLs Caregiver Availability: 24/7 Discharge Plan Discussed with Primary Caregiver: Yes Is Caregiver In Agreement with Plan?: Yes Does Caregiver/Family have Issues with Lodging/Transportation while Pt is in Rehab?:  No  Goals Patient/Family Goal for Rehab: PT supervision/mod I, OT supervision/min assist, SLP mod assist Expected length of stay: 10-12 days Additional Information: Discharge plan: return to pt's home where he lives with his mother, mom (Donna) can provide 24/7 assist at discharge Pt/Family Agrees to Admission and willing to participate: Yes Program Orientation Provided & Reviewed with Pt/Caregiver Including Roles  & Responsibilities: Yes  Barriers to Discharge: Insurance for SNF coverage  Decrease burden of Care through IP rehab admission: n/a  Possible need for SNF placement upon discharge: Not anticipated.  Plan to return to pt's home where he lives with his mother; she can provide 24/7 supervision/assist   Patient Condition: I have reviewed medical records from [place], spoken with  TOC , and patient and family member. I met with patient at the bedside and discussed via phone for inpatient rehabilitation assessment.  Patient will benefit from ongoing PT, OT, and SLP, can actively participate in 3 hours of therapy a day 5 days of the week, and can make measurable gains during the admission.  Patient will also benefit from the coordinated team approach during an Inpatient Acute Rehabilitation admission.  The patient will receive intensive therapy as well as Rehabilitation physician, nursing, social worker, and care management interventions.  Due to bladder management, bowel management, safety, skin/wound care, disease management, medication administration, pain management, and patient education the patient requires 24 hour a day rehabilitation nursing.  The patient is currently *** with mobility and basic ADLs.  Discharge setting and therapy post discharge at home with home health is anticipated.  Patient has agreed to participate in the Acute Inpatient Rehabilitation Program and will admit {Time; today/tomorrow:10263}.  Preadmission Screen Completed By:  Tuan Tippin E Alick Lecomte, PT, DPT 03/26/2023  10:36 AM ______________________________________________________________________   Discussed status with Dr. *** on *** at *** and received approval for admission today.  Admission Coordinator:  Danial Hlavac E Maelys Kinnick, PT, DPT time 10:36 AM /Date 03/26/23    Assessment/Plan: Diagnosis: Does the need for close, 24 hr/day Medical supervision in concert with the patient's rehab needs make it unreasonable for this patient to be served in a less intensive setting? {yes_no_potentially:3041433} Co-Morbidities requiring supervision/potential complications: *** Due to {due to:3041434}, does the patient require 24 hr/day rehab nursing? {yes_no_potentially:3041433} Does the patient require coordinated care of a physician, rehab nurse, PT, OT, and SLP to address physical and functional deficits in the context of the above medical diagnosis(es)? {yes_no_potentially:3041433} Addressing deficits in the following areas: {deficits:3041436} Can the patient actively participate in an intensive therapy program of at least 3 hrs of therapy 5 days a week? {yes_no_potentially:3041433} The potential for patient to make measurable gains while on inpatient rehab is {potential:3041437} Anticipated functional outcomes upon discharge from inpatient rehab: {functional outcomes:304600100} PT, {functional outcomes:304600100} OT, {functional outcomes:304600100} SLP Estimated rehab length   of stay to reach the above functional goals is: *** Anticipated discharge destination: {anticipated dc setting:21604} 10. Overall Rehab/Functional Prognosis: {potential:3041437}   MD Signature: *** 

## 2023-03-27 NOTE — TOC Transition Note (Addendum)
Transition of Care [facility]) - CM/SW Discharge Note   Patient Details  Name: Brian Rios MRN: [id number] Date of Birth: 1981-01-28  [facility]) CM/SW Contact:  Baldemar Lenis, LCSW Phone Number: 03/27/2023, 12:00 PM   Clinical Narrative:   CSW notified by Rehab Admissions that patient is discharging to CIR today, no further TOC needs.   Final next level of care: IP Rehab Facility Barriers to Discharge: Barriers Resolved   Patient Goals and CMS Choice      Discharge Placement                         Discharge Plan and Services Additional resources added to the After Visit Summary for     Discharge Planning Services: CM Consult                                 Social Determinants of Health (SDOH) Interventions SDOH Screenings   Food Insecurity: Food Insecurity Present (06/12/2022)  Housing: High Risk (06/12/2022)  Transportation Needs: Unmet Transportation Needs (06/12/2022)  Alcohol Screen: Low Risk  (03/08/2021)  Depression (PHQ2-9): High Risk (06/12/2022)  Tobacco Use: High Risk (03/27/2023)     Readmission Risk Interventions     No data to display

## 2023-03-27 NOTE — Progress Notes (Signed)
Inpatient Rehab Admissions Coordinator:   I have a bed for this patient to admit today.  Dr. Pearlean Brownie in agreement. I will let pt/family and TOC team know.   Estill Dooms, PT, DPT Admissions Coordinator [phone] 03/27/23  10:25 AM

## 2023-03-27 NOTE — Progress Notes (Signed)
Physical Therapy Treatment Patient Details Name: Brian Rios MRN: [id number] DOB: 12/22/1980 Today's Date: 03/27/2023   History of Present Illness Brian Rios is a 42 y.o. male who presented [facility]/21/2023 to the [facility] ED today with acute onset of right sided weakness, right facial droop and dysphasia; pt transported to [facility] for emergent thrombectomy. TICI 2C revascularization was achieved. MRI reveals large left MCA stroke. 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    PT Comments    Pt making excellent progress towards his physical therapy goals; decreased impulsivity and smiling intermittently this session. Continued difficulty with expressive and receptive communication. Pt ambulating hallway distances with no assistive device at a min guard assist level. Demonstrates mild decreased RLE coordination and weakness. Concluded session with quadruped exercises to promote increased RUE weightbearing and feedback with good success. Patient will benefit from intensive inpatient follow up therapy, >3 hours/day.    Recommendations for follow up therapy are one component of a multi-disciplinary discharge planning process, led by the attending physician.  Recommendations may be updated based on patient status, additional functional criteria and insurance authorization.  Follow Up Recommendations       Assistance Recommended at Discharge Frequent or constant Supervision/Assistance  Patient can return home with the following Assistance with cooking/housework;Direct supervision/assist for medications management;Assist for transportation;Help with stairs or ramp for entrance;A little help with walking and/or transfers;A lot of help with bathing/dressing/bathroom   Equipment Recommendations  None recommended by PT     Recommendations for Other Services       Precautions / Restrictions Precautions Precautions: Fall Restrictions Weight Bearing Restrictions: No     Mobility  Bed Mobility Overal bed mobility: Modified Independent                  Transfers Overall transfer level: Needs assistance Equipment used: None Transfers: Sit to/from Stand Sit to Stand: Supervision                Ambulation/Gait Ambulation/Gait assistance: Min guard Gait Distance (Feet): 200 Feet Assistive device: None Gait Pattern/deviations: Step-through pattern, Decreased stance time - right, Decreased dorsiflexion - right, Decreased weight shift to right, Wide base of support, Decreased step length - right Gait velocity: decreased     General Gait Details: One episode of R knee buckle, requiring min guard assist throughout. Improved neutral R foot positioning and foot clearance during swing phase   Stairs             Wheelchair Mobility    Modified Rankin (Stroke Patients Only) Modified Rankin (Stroke Patients Only) Pre-Morbid Rankin Score: No symptoms Modified Rankin: Moderately severe disability     Balance Overall balance assessment: Needs assistance Sitting-balance support: Feet supported Sitting balance-Leahy Scale: Good     Standing balance support: No upper extremity supported, During functional activity Standing balance-Leahy Scale: Fair                              Cognition Arousal/Alertness: Awake/alert Behavior During Therapy: Impulsive Overall Cognitive Status: Difficult to assess                                 General Comments: follows most simple commands, more difficulty with multistep directions.Able to utilize communication board for more consistent "yes" and "no" responses.. Decreased  impulsivity this session        Exercises Other Exercises Other Exercises: Dynamic standing balance task: functional reaching with BUE's Other  Exercises: Quadruped: L shoulder flexion x 5 reps (2 sets), weight shifting laterally to R/L and ant/posteriorly    General Comments        Pertinent Vitals/Pain Pain Assessment Pain Assessment: Faces Faces Pain Scale: No hurt    Home Living                          Prior Function            PT Goals (current goals can now be found in the care plan section) Acute Rehab PT Goals Patient Stated Goal: get home Potential to Achieve Goals: Good Progress towards PT goals: Progressing toward goals    Frequency    Min 4X/week      PT Plan Current plan remains appropriate    Co-evaluation              AM-PAC PT "6 Clicks" Mobility   Outcome Measure  Help needed turning from your back to your side while in a flat bed without using bedrails?: None Help needed moving from lying on your back to sitting on the side of a flat bed without using bedrails?: None Help needed moving to and from a bed to a chair (including a wheelchair)?: A Little Help needed standing up from a chair using your arms (e.g., wheelchair or bedside chair)?: A Little Help needed to walk in hospital room?: A Little Help needed climbing 3-5 steps with a railing? : A Little 6 Click Score: 20    End of Session Equipment Utilized During Treatment: Gait belt Activity Tolerance: Patient tolerated treatment well Patient left: with call bell/phone within reach;in bed Nurse Communication: Mobility status PT Visit Diagnosis: Muscle weakness (generalized) (M62.81);Difficulty in walking, not elsewhere classified (R26.2);Other abnormalities of gait and mobility (R26.89);Unsteadiness on feet (R26.81);Hemiplegia and hemiparesis Hemiplegia - Right/Left: Right Hemiplegia - caused by: Cerebral infarction     Time: 1112-1128 PT Time Calculation (min) (ACUTE ONLY): 16 min  Charges:  $Therapeutic Activity: 8-22 mins                     Brian Rios, PT, DPT Acute Rehabilitation Services Office  [phone]    Brian Rios 03/27/2023, 12:17 PM

## 2023-03-28 LAB — COMPREHENSIVE METABOLIC PANEL
ALT: 34 U/L (ref 0–44)
AST: 33 U/L (ref 15–41)
Albumin: 2.8 g/dL — ABNORMAL LOW (ref 3.5–5.0)
Alkaline Phosphatase: 59 U/L (ref 38–126)
Anion gap: 10 (ref 5–15)
BUN: 7 mg/dL (ref 6–20)
CO2: 23 mmol/L (ref 22–32)
Calcium: 9 mg/dL (ref 8.9–10.3)
Chloride: 104 mmol/L (ref 98–111)
Creatinine, Ser: 0.96 mg/dL (ref 0.61–1.24)
GFR, Estimated: >60 mL/min (ref >60)
Glucose, Bld: 96 mg/dL (ref 70–99)
Potassium: 3.9 mmol/L (ref 3.5–5.1)
Sodium: 137 mmol/L (ref 135–145)
Total Bilirubin: 0.6 mg/dL (ref 0.3–1.2)
Total Protein: 6.8 g/dL (ref 6.5–8.1)

## 2023-03-28 LAB — CBC WITH DIFFERENTIAL/PLATELET
Abs Immature Granulocytes: 0.26 10*3/uL — ABNORMAL HIGH (ref 0.00–0.07)
Basophils Absolute: 0.1 10*3/uL (ref 0.0–0.1)
Basophils Relative: 1 %
Eosinophils Absolute: 0.3 10*3/uL (ref 0.0–0.5)
Eosinophils Relative: 3 %
HCT: 41.8 % (ref 39.0–52.0)
Hemoglobin: 13.7 g/dL (ref 13.0–17.0)
Immature Granulocytes: 2 %
Lymphocytes Relative: 11 %
Lymphs Abs: 1.4 10*3/uL (ref 0.7–4.0)
MCH: 27.3 pg (ref 26.0–34.0)
MCHC: 32.8 g/dL (ref 30.0–36.0)
MCV: 83.4 fL (ref 80.0–100.0)
Monocytes Absolute: 1.1 10*3/uL — ABNORMAL HIGH (ref 0.1–1.0)
Monocytes Relative: 9 %
Neutro Abs: 9.4 10*3/uL — ABNORMAL HIGH (ref 1.7–7.7)
Neutrophils Relative %: 74 %
Platelets: 329 10*3/uL (ref 150–400)
RBC: 5.01 MIL/uL (ref 4.22–5.81)
RDW: 13.6 % (ref 11.5–15.5)
WBC: 12.6 10*3/uL — ABNORMAL HIGH (ref 4.0–10.5)
nRBC: 0 % (ref 0.0–0.2)

## 2023-03-28 LAB — GLUCOSE, CAPILLARY
Glucose-Capillary: 98 mg/dL (ref 70–99)
Glucose-Capillary: 115 mg/dL — ABNORMAL HIGH (ref 70–99)

## 2023-03-28 MED ORDER — SODIUM CHLORIDE 0.9 % IV SOLN: INTRAVENOUS | Status: DC | PRN

## 2023-03-28 NOTE — Progress Notes (Signed)
Physical Therapy Assessment and Plan  Patient Details  Name: Brian Rios MRN: [id number] Date of Birth: 03-05-1981  PT Diagnosis: Abnormality of gait, Difficulty walking, Hemiplegia dominant, Impaired sensation, and Muscle weakness Rehab Potential: Good ELOS: 7-10 days   Today's Date: 03/28/2023 PT Individual Time: 1st Treatment Session: 0800-0905; 2nd Treatment Session: 1130-1200 PT Individual Time Calculation (min): 65 min; 30 min  Hospital Problem: Principal Problem:   Left middle cerebral artery stroke [facility])   Past Medical History:  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   left cerebrum   Erectile dysfunction    Heart murmur    Hyperlipidemia    Schizophrenia (HCC)    Tobacco abuse    Past Surgical History:  Past Surgical History:  Procedure Laterality Date   BOWEL RESECTION N/A 09/04/2022   Procedure: SMALL BOWEL RESECTION, open;  Surgeon: Leafy Ro, MD;  Location: [facility] ORS;  Service: General;  Laterality: N/A;   IR CT HEAD LTD  03/21/2023   IR CT HEAD LTD  03/21/2023   IR PERCUTANEOUS ART THROMBECTOMY/INFUSION INTRACRANIAL INC DIAG ANGIO  03/21/2023   ORIF ORBITAL FRACTURE  1985   PARTIAL COLECTOMY Right 09/04/2022   Procedure: PARTIAL COLECTOMY;  Surgeon: Leafy Ro, MD;  Location: [facility] ORS;  Service: General;  Laterality: Right;  Right colectimy   RADIOLOGY WITH ANESTHESIA N/A 03/21/2023   Procedure: RADIOLOGY WITH ANESTHESIA;  Surgeon: Radiologist, Medication, MD;  Location: [facility] OR;  Service: Radiology;  Laterality: N/A;   TEE WITHOUT CARDIOVERSION N/A 07/19/2020   Procedure: TRANSESOPHAGEAL ECHOCARDIOGRAM (TEE);  Surgeon: Lamar Blinks, MD;  Location: [facility] ORS;  Service: Cardiovascular;  Laterality: N/A;   WRIST SURGERY Left 11/07/2013   REPAIR ULNAR ARTERY, NERVE AND TENDON    Assessment & Plan Clinical Impression: Patient is a  42 year old right-handed male with history of alcohol/tobacco use, left basal ganglia embolic stroke in 2021, schizophrenia maintained on Abilify, Depakote, Lexapro as well as Cogentin, hyperlipidemia, 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 Presented [facility]/21/2023 to [facility] with acute onset of right-sided weakness facial droop and aphasia. Cranial CT scan worrisome for acute infarct in the posterior left parietal lobe. CTA showed occlusion of 2 proximal left M2 branches and mild left M1 stenosis. Widely patent carotid arteries. Admission chemistries unremarkable except potassium 3.2, glucose 127, WBC 13,400, hemoglobin A1c 5.6, alcohol negative, urine drug screen positive cocaine as well as marijuana. Patient underwent arteriogram with revascularization of occluded left MCA however it reoccluded. MRI of the brain showed interval increase in size of acute/subacute nonhemorrhagic infarct involving the majority the left insular cortex and posterior left frontal operculum extending into the high posterior left parietal lobe. No acute hemorrhage or significant mass effect. Echocardiogram ejection fraction of 60 to 65% no wall motion abnormalities. Patient placed on low-dose aspirin as well as Brilinta for CVA prophylaxis. Lovenox was added for DVT prophylaxis. Hospital course complicated by bouts of agitation and restlessness and patient did make 1 attempt to leave the hospital but was ultimately able to be redirected. Spiked a low-grade fever 100.7 WBC increasing to 14,700 chest x-ray revealed right lower lobe pneumonia initially placed on Zosyn transition to Unasyn. Maintained on mechanical soft diet with thin liquids. Therapy evaluations completed due to patient's decreased functional ability right-sided weakness and aphasia was admitted for a comprehensive rehab program.   Patient currently requires min with mobility secondary  to muscle weakness, decreased cardiorespiratoy endurance, abnormal tone and decreased  coordination, impaired vision, decreased attention to left, decreased problem solving and decreased safety awareness, peripheral, and decreased standing balance, hemiplegia, and decreased balance strategies.  Prior to hospitalization, patient was independent  with mobility and lived with Family (Mother) in a Mobile home home.  Home access is  Other (comment) (Threshold step).  Patient will benefit from skilled PT intervention to maximize safe functional mobility, minimize fall risk, and decrease caregiver burden for planned discharge home with intermittent assist.  Anticipate patient will benefit from follow up OP at discharge.  PT - End of Session Activity Tolerance: Tolerates 30+ min activity with multiple rests Endurance Deficit: Yes Endurance Deficit Description: Global deconditioning PT Assessment Rehab Potential (ACUTE/IP ONLY): Good PT Barriers to Discharge: Insurance for SNF coverage;Nutrition means;Home environment access/layout PT Patient demonstrates impairments in the following area(s): Balance;Perception;Safety;Sensory;Endurance;Motor;Pain PT Transfers Functional Problem(s): Bed Mobility;Bed to Chair;Car;Furniture;Floor PT Locomotion Functional Problem(s): Ambulation;Stairs PT Plan PT Intensity: Minimum of 1-2 x/day ,45 to 90 minutes PT Frequency: 5 out of 7 days PT Duration Estimated Length of Stay: 7-10 days PT Treatment/Interventions: Ambulation/gait training;Community reintegration;DME/adaptive equipment instruction;Neuromuscular re-education;Psychosocial support;Stair training;UE/LE Strength taining/ROM;Wheelchair propulsion/positioning;Balance/vestibular training;Discharge planning;Functional electrical stimulation;Pain management;Therapeutic Activities;UE/LE Coordination activities;Cognitive remediation/compensation;Disease management/prevention;Functional mobility training;Patient/family education;Splinting/orthotics;Therapeutic Exercise;Visual/perceptual  remediation/compensation PT Transfers Anticipated Outcome(s): Supv with LRAD PT Locomotion Anticipated Outcome(s): Supv with LRAD PT Recommendation Recommendations for Other Services: Therapeutic Recreation consult Therapeutic Recreation Interventions: Pet therapy;Stress management;Outing/community reintergration Follow Up Recommendations: Outpatient PT Patient destination: Home Equipment Recommended: To be determined Equipment Details: TBD   PT Evaluation Precautions/Restrictions Precautions Precautions: Fall Restrictions Weight Bearing Restrictions: No Pain Pain Assessment Pain Scale: 0-10 Pain Score: 0-No pain Pain Interference Pain Interference Pain Effect on Sleep: 2. Occasionally Pain Interference with Therapy Activities: 2. Occasionally Pain Interference with Day-to-Day Activities: 2. Occasionally Home Living/Prior Functioning Home Living Available Help at Discharge: Family;Available PRN/intermittently Type of Home: Mobile home Home Access: Other (comment) (Threshold step) Home Layout: One level Bathroom Shower/Tub: Engineer, manufacturing systems: Standard Bathroom Accessibility: Yes Additional Comments: Patient with expressive aphasia- Home environment information needs to be verified with family  Lives With: Family (Mother) Prior Function Level of Independence: Independent with basic ADLs;Independent with homemaking with ambulation;Independent with gait;Independent with transfers  Able to Take Stairs?: Yes Driving: No Vocation: Unemployed Leisure: Hobbies-yes (Comment) (Enjoys listening to music and spending time with family/friends) Vision/Perception  Vision - History Ability to See in Adequate Light: 2 Moderately impaired Vision - Assessment Additional Comments: Lt eye stay abducted however tracks with Rt eyes in all planes with slight delay in tracking. L inattention- Unsure of baseline. Perception Inattention/Neglect: Impaired-to be further tested in  functional context  Cognition Overall Cognitive Status: Difficult to assess Arousal/Alertness: Awake/alert Orientation Level: Oriented to place;Oriented to person;Oriented to time;Oriented to situation Awareness: Impaired Problem Solving: Impaired Safety/Judgment: Impaired Sensation Sensation Light Touch: Appears Intact Additional Comments: Per patient report, sensation is intact however challenging to assess with expressive aphasia Coordination Gross Motor Movements are Fluid and Coordinated: No Fine Motor Movements are Fluid and Coordinated: No Finger Nose Finger Test: With Lt finger overshoots therapist finger and then undershoot by placing finger in his mouth Heel Shin Test: Challenging to assess with aphasia- Patient places each LE into figure four pattern throughout assessment Motor  Motor Motor: Hemiplegia Motor - Skilled Clinical Observations: Mild Rt hemi (Rt UE>LE)   Trunk/Postural Assessment  Cervical Assessment Cervical Assessment: Within Functional Limits Thoracic Assessment Thoracic Assessment: Exceptions to [facility] (Rounded shoulders) Lumbar  Assessment Lumbar Assessment: Within Functional Limits Postural Control Postural Control: Deficits on evaluation Righting Reactions: Delayed  Balance Balance Balance Assessed: Yes Standardized Balance Assessment Standardized Balance Assessment: Berg Balance Test Berg Balance Test Sit to Stand: Able to stand without using hands and stabilize independently Standing Unsupported: Able to stand 2 minutes with supervision Sitting with Back Unsupported but Feet Supported on Floor or Stool: Able to sit safely and securely 2 minutes Stand to Sit: Sits safely with minimal use of hands Transfers: Able to transfer safely, minor use of hands Standing Unsupported with Eyes Closed: Able to stand 10 seconds with supervision Standing Ubsupported with Feet Together: Able to place feet together independently and stand for 1 minute with  supervision From Standing, Reach Forward with Outstretched Arm: Can reach forward >5 cm safely (2") From Standing Position, Pick up Object from Floor: Able to pick up shoe, needs supervision From Standing Position, Turn to Look Behind Over each Shoulder: Looks behind from both sides and weight shifts well Turn 360 Degrees: Able to turn 360 degrees safely one side only in 4 seconds or less Standing Unsupported, Alternately Place Feet on Step/Stool: Able to complete >2 steps/needs minimal assist Standing Unsupported, One Foot in Front: Able to take small step independently and hold 30 seconds Standing on One Leg: Able to lift leg independently and hold 5-10 seconds Total Score: 43 Static Sitting Balance Static Sitting - Balance Support: Feet supported Static Sitting - Level of Assistance: 6: Modified independent (Device/Increase time) Dynamic Sitting Balance Dynamic Sitting - Balance Support: Feet supported Dynamic Sitting - Level of Assistance: 6: Modified independent (Device/Increase time) Static Standing Balance Static Standing - Balance Support: No upper extremity supported Static Standing - Level of Assistance: 4: Min assist;5: Stand by assistance (CGA) Dynamic Standing Balance Dynamic Standing - Balance Support: During functional activity;No upper extremity supported Dynamic Standing - Level of Assistance: 4: Min assist Extremity Assessment      RLE Assessment RLE Assessment: Exceptions to [facility] General Strength Comments: Grossly 4+/5 with Rt hip flexors 4/5 LLE Assessment LLE Assessment: Within Functional Limits General Strength Comments: Grossly 5/5  Care Tool Care Tool Bed Mobility Roll left and right activity   Roll left and right assist level: Supervision/Verbal cueing    Sit to lying activity   Sit to lying assist level: Supervision/Verbal cueing    Lying to sitting on side of bed activity   Lying to sitting on side of bed assist level: the ability to move from lying  on the back to sitting on the side of the bed with no back support.: Supervision/Verbal cueing     Care Tool Transfers Sit to stand transfer   Sit to stand assist level: Contact Guard/Touching assist    Chair/bed transfer   Chair/bed transfer assist level: Minimal Assistance - Patient > 75%     Psychologist, clinical transfer assist level: Minimal Assistance - Patient > 75%      Care Tool Locomotion Ambulation   Assist level: Minimal Assistance - Patient > 75% Assistive device: No Device Max distance: 200+  Walk 10 feet activity   Assist level: Minimal Assistance - Patient > 75% Assistive device: No Device   Walk 50 feet with 2 turns activity   Assist level: Minimal Assistance - Patient > 75% Assistive device: No Device  Walk 150 feet activity   Assist level: Minimal Assistance - Patient > 75% Assistive device: No Device  Walk 10  feet on uneven surfaces activity   Assist level: Minimal Assistance - Patient > 75% Assistive device: Other (comment) (No AD)  Stairs   Assist level: Contact Guard/Touching assist Stairs assistive device: 1 hand rail Max number of stairs: 12  Walk up/down 1 step activity   Walk up/down 1 step (curb) assist level: Contact Guard/Touching assist Walk up/down 1 step or curb assistive device: 1 hand rail  Walk up/down 4 steps activity   Walk up/down 4 steps assist level: Contact Guard/Touching assist Walk up/down 4 steps assistive device: 1 hand rail  Walk up/down 12 steps activity   Walk up/down 12 steps assist level: Contact Guard/Touching assist Walk up/down 12 steps assistive device: 1 hand rail  Pick up small objects from floor   Pick up small object from the floor assist level: Contact Guard/Touching assist Pick up small object from the floor assistive device: No AD  Wheelchair Is the patient using a wheelchair?: No          Wheel 50 feet with 2 turns activity      Wheel 150 feet activity        Refer to Care  Plan for Long Term Goals  SHORT TERM GOAL WEEK 1 PT Short Term Goal 1 (Week 1): STGs=LTGs secondary to ELOS  Recommendations for other services: Therapeutic Recreation  Pet therapy, Stress management, and Outing/community reintegration  Skilled Therapeutic Intervention Mobility Bed Mobility Bed Mobility: Rolling Right;Rolling Left;Supine to Sit;Sit to Supine Rolling Right: Supervision/verbal cueing Rolling Left: Supervision/Verbal cueing Supine to Sit: Supervision/Verbal cueing Sit to Supine: Supervision/Verbal cueing Transfers Transfers: Sit to Stand;Stand to Sit;Stand Pivot Transfers Sit to Stand: Contact Guard/Touching assist Stand to Sit: Contact Guard/Touching assist Stand Pivot Transfers: Minimal Assistance - Patient > 75% Stand Pivot Transfer Details: Verbal cues for sequencing;Verbal cues for precautions/safety Transfer (Assistive device): None Locomotion  Gait Ambulation: Yes Gait Assistance: Minimal Assistance - Patient > 75% Gait Distance (Feet): 200 Feet Assistive device: None Gait Assistance Details: Verbal cues for sequencing;Verbal cues for precautions/safety Gait Gait: Yes Gait Pattern: Decreased step length - left;Decreased weight shift to right;Decreased dorsiflexion - left Stairs / Additional Locomotion Stairs: Yes Stairs Assistance: Minimal Assistance - Patient > 75% Stair Management Technique: One rail Left Number of Stairs: 12 Height of Stairs: 6 Ramp: Minimal Assistance - Patient >75% Curb: Minimal Assistance - Patient >75% Wheelchair Mobility Wheelchair Mobility: No  Skilled Intervention:  1st Treatment Session- Patient greeted semi-reclined in bed and agreeable to PT treatment session. Evaluation completed (see details above and below) with education on PT POC and goals and individual treatment initiated with focus on gait, sit/stands, transfers, car transfers, ramp mobility, stair mobility, endurance/activity tolerance, dynamic stability,  discharge planning, education, etc. Patient completed all functional mobility without the use of an AD and CGA/MinA overall. Patient returned to his room and left sitting EOB with bed alarm on, call bell within reach and NT present assisting with breakfast.    2nd Treatment Session-  Patient greeted supine in bed and agreeable to PT treatment session. Patient transitioned to sitting EOB with supv and then stood from EOB without the use of an AD and CGA/SBA for safety. Patient gait trained to/from his room and day room without the use of an AD and CGA/MinA- At times patient demonstrates poor Lt foot clearance leading to poor overall stability and requiring MinA for regaining his balance. Patient completed the [facility] Balance Assessment (please see above for further details). Patient demonstrates increased fall risk as noted by score  of 47/56 on Berg Balance Scale.  (<36= high risk for falls, close to 100%; 37-45 significant >80%; 46-51 moderate >50%; 52-55 lower >25%). Patient completed the Nustep x5.5 minutes with B UE/LE for improved coordination. Patient returned to his room and left sitting EOB with bed alarm on, call bell within reach and all needs met.    Discharge Criteria: Patient will be discharged from PT if patient refuses treatment 3 consecutive times without medical reason, if treatment goals not met, if there is a change in medical status, if patient makes no progress towards goals or if patient is discharged from hospital.  The above assessment, treatment plan, treatment alternatives and goals were discussed and mutually agreed upon: by patient  Brian Rios  Shizuye Rupert 03/28/2023, 11:49 AM

## 2023-03-28 NOTE — Evaluation (Signed)
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   left cerebrum   Erectile dysfunction    Heart murmur    Hyperlipidemia    Schizophrenia (HCC)    Tobacco abuse    Past Surgical History:  Past Surgical History:  Procedure Laterality Date   BOWEL RESECTION N/A 09/04/2022   Procedure: SMALL BOWEL RESECTION, open;  Surgeon: Leafy Ro, MD;  Location: [facility] ORS;  Service: General;  Laterality: N/A;   IR CT HEAD LTD  03/21/2023   IR CT HEAD LTD  03/21/2023   IR PERCUTANEOUS ART THROMBECTOMY/INFUSION INTRACRANIAL INC DIAG ANGIO  03/21/2023   ORIF ORBITAL FRACTURE  1985   PARTIAL COLECTOMY Right 09/04/2022   Procedure: PARTIAL COLECTOMY;  Surgeon: Leafy Ro, MD;  Location: [facility] ORS;  Service: General;  Laterality: Right;  Right colectimy   RADIOLOGY WITH ANESTHESIA N/A 03/21/2023   Procedure: RADIOLOGY WITH ANESTHESIA;  Surgeon: Radiologist, Medication, MD;  Location: [facility] OR;  Service: Radiology;  Laterality: N/A;   TEE WITHOUT CARDIOVERSION N/A 07/19/2020   Procedure: TRANSESOPHAGEAL ECHOCARDIOGRAM (TEE);  Surgeon: Lamar Blinks, MD;  Location: [facility] ORS;  Service: Cardiovascular;  Laterality: N/A;   WRIST SURGERY Left 11/07/2013   REPAIR ULNAR ARTERY, NERVE AND TENDON    Assessment / Plan / Recommendation Clinical Impression Patient is a 42 year old right-handed male with PMH of alcohol/tobacco use, left basal ganglia  embolic stroke in 2021 (no residual deficits), schizophrenia maintained on Abilify, Depakote, Lexapro as well as Cogentin, hyperlipidemia, and 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 Presented 03/21/2023 to [facility] with acute onset of right-sided weakness facial droop and aphasia. Cranial CT scan worrisome for acute infarct in the posterior left parietal lobe. CTA showed occlusion of 2 proximal left M2 branches and mild left M1 stenosis. Widely patent carotid arteries.  Patient transferred to [facility] for admission and underwent arteriogram with revascularization of occluded left MCA however it reoccluded. MRI of the brain showed interval increase in size of acute/subacute nonhemorrhagic infarct involving the majority the left insular cortex and posterior left frontal operculum extending into the high posterior left parietal lobe. No acute hemorrhage or significant mass effect. Hospital course complicated by bouts of agitation and restlessness and patient did make 1 attempt to leave the hospital but was ultimately able to be redirected. Spiked a low-grade fever 100.7 WBC increasing to 14,700 chest x-ray revealed right lower lobe pneumonia. Maintained on mechanical soft diet with thin liquids. Therapy evaluations completed with recommendations for a comprehensive rehab program. Patient admitted 03/27/23.  Patient's auditory comprehension appeared [facility] for tasks assessed regarding yes/no questions and following basic commands. Patient demonstrated decreased ability to follow multi-step commands, however, function impacted by obvious motor planning deficits. Patient able to identify objects and answer orientation questions when given written choices from a field of 2.  Patient demonstrated appropriate reading comprehension of function words from a field of 6 and demonstrated perseveration when attempting to write functional information at the word level. Patient  verbalized "yes" and "no" consistently throughout  evaluation and named functional items with 100% accuracy given extra time and Mod verbal and written cues. Patient also performed automatic speech sequences with extra time. Patient unable to spontaneously verbalize wants/needs with groping like behaviors observed due to motor planning deficits. Patient with decreased responsiveness to cues and/or assistance. SLP provide patient with a simple 6 word communication board in which he was able to utilize throughout session during structured scenarios, however, question if patient will utilize for basic communication of wants/needs.   Patient demonstrates an oral dysphagia characterized by right motor weakness with decreased ROM and sensation resulting in anterior spillage with minimal awareness.  Patient with impaired mastication resulting in mild right buccal pocketing that patient utilized liquid washes to successfully clear. Patient consumed large sips of thin liquids via cup despite cues with an intermittent, subtle throat clear observed X 2. Recommend patient continue current diet of Dys. 3 textures with thin liquids via cup with full supervision to maximize utilization of swallowing compensatory strategies and overall safety with PO intake.   Patient unable to participate in a formal cognitive evaluation, however, patient demonstrated appropriate orientation X 4 and sustained attention but poor awareness of deficits throughout with poor responsiveness to cues.   Patient would benefit from skilled SLP intervention to maximize his functional communication, cognitive functioning and swallowing function prior to discharge.    Skilled Therapeutic Interventions          Administered a cognitive-linguistic and BSE, please see above for details.   SLP Assessment  Patient will need skilled Speech Lanaguage Pathology Services during CIR admission    Recommendations  SLP Diet Recommendations: Dysphagia 3 (Mech soft);Thin Liquid Administration via: Cup;No  straw Medication Administration: Whole meds with puree Supervision: Patient able to self feed;Full supervision/cueing for compensatory strategies Compensations: Slow rate;Small sips/bites;Follow solids with liquid;Minimize environmental distractions;Lingual sweep for clearance of pocketing;Monitor for anterior loss Postural Changes and/or Swallow Maneuvers: Seated upright 90 degrees Oral Care Recommendations: Oral care BID Patient destination: Home Follow up Recommendations: Outpatient SLP;24 hour supervision/assistance;Home Health SLP Equipment Recommended: None recommended by SLP    SLP Frequency 1 to 3 out of 7 days   SLP Duration  SLP Intensity  SLP Treatment/Interventions 7-10 days  Minumum of 1-2 x/day, 30 to 90 minutes  Cognitive remediation/compensation;Cueing hierarchy;Dysphagia/aspiration precaution training;Environmental controls;Functional tasks;Internal/external aids;Patient/family education;Therapeutic Activities;Speech/Language facilitation    Pain No/Denies Pain  Prior Functioning Type of Home: Mobile home  Lives With: Family (mother) Available Help at Discharge: Family;Available PRN/intermittently Vocation: Unemployed  SLP Evaluation Cognition Overall Cognitive Status: Difficult to assess Arousal/Alertness: Awake/alert Orientation Level: Oriented to place;Oriented to person;Oriented to time;Oriented to situation Memory: Appears intact Awareness: Impaired Awareness Impairment: Emergent impairment Problem Solving: Impaired Problem Solving Impairment: Functional basic Safety/Judgment: Impaired  Comprehension Auditory Comprehension Overall Auditory Comprehension: Impaired Yes/No Questions: Impaired Basic Biographical Questions: 76-100% accurate Basic Immediate Environment Questions: 75-100% accurate Complex Questions: 75-100% accurate Commands: Impaired One Step Basic Commands: 75-100% accurate Two Step Basic Commands: 0-24% accurate Conversation:  Simple Interfering Components: Attention;Processing speed;Motor planning EffectiveTechniques: Extra processing time;Visual/Gestural cues Visual Recognition/Discrimination Discrimination: Within Function Limits Expression Expression Primary Mode of Expression: Verbal Verbal Expression Overall Verbal Expression: Impaired Initiation: Impaired Automatic Speech: Name;Counting Level of Generative/Spontaneous Verbalization: Word Repetition: Impaired Level of Impairment: Word level Naming: Impairment Responsive: 0-25% accurate Confrontation: Impaired Pragmatics: Impairment Impairments: Eye contact Written Expression Dominant Hand: Right Written Expression: Not tested Oral Motor Oral Motor/Sensory Function Overall Oral Motor/Sensory Function: Moderate impairment Facial ROM: Reduced  right;Suspected CN VII (facial) dysfunction Facial Symmetry: Abnormal symmetry right;Suspected CN VII (facial) dysfunction Facial Strength: Reduced right;Suspected CN VII (facial) dysfunction Facial Sensation: Reduced right;Suspected CN V (Trigeminal) dysfunction Lingual ROM: Reduced right;Suspected CN XII (hypoglossal) dysfunction Lingual Symmetry: Abnormal symmetry right;Suspected CN XII (hypoglossal) dysfunction Motor Speech Overall Motor Speech: Impaired Respiration: Within functional limits Phonation: Normal Articulation: Impaired Level of Impairment: Word Intelligibility: Intelligibility reduced Word: 75-100% accurate Phrase: 0-24% accurate Motor Planning: Impaired Level of Impairment: Word Motor Speech Errors: Aware  Care Tool Care Tool Cognition Ability to hear (with hearing aid or hearing appliances if normally used Ability to hear (with hearing aid or hearing appliances if normally used): 0. Adequate - no difficulty in normal conservation, social interaction, listening to TV   Expression of Ideas and Wants Expression of Ideas and Wants: 2. Frequent difficulty - frequently exhibits  difficulty with expressing needs and ideas   Understanding Verbal and Non-Verbal Content Understanding Verbal and Non-Verbal Content: 2. Sometimes understands - understands only basic conversations or simple, direct phrases. Frequently requires cues to understand  Memory/Recall Ability Memory/Recall Ability : Current season;That he or she is in a hospital/hospital unit   Bedside Swallowing Assessment General Date of Onset: 03/21/23 Previous Swallow Assessment: MBS 6/2: recommended Dys.3  textures with thin liquids Diet Prior to this Study: Dysphagia 3 (mechanical soft);Thin liquids (Level 0) Temperature Spikes Noted: No Respiratory Status: Room air History of Recent Intubation: No Behavior/Cognition: Alert;Cooperative;Requires cueing;Impulsive Oral Cavity - Dentition: Adequate natural dentition Self-Feeding Abilities: Able to feed self;Needs set up Vision: Functional for self-feeding Patient Positioning: Upright in bed Baseline Vocal Quality: Normal Volitional Cough: Cognitively unable to elicit Volitional Swallow: Able to elicit  Ice Chips Ice chips: Not tested Thin Liquid Thin Liquid: Impaired Presentation: Straw;Cup Oral Phase Impairments: Reduced labial seal Oral Phase Functional Implications: Right anterior spillage Pharyngeal  Phase Impairments: Throat Clearing - Delayed Nectar Thick Nectar Thick Liquid: Not tested Honey Thick Honey Thick Liquid: Not tested Puree Puree: Impaired Presentation: Spoon;Self Fed Oral Phase Impairments: Reduced labial seal Oral Phase Functional Implications: Oral residue;Right anterior spillage Solid Solid: Impaired Presentation: Self Fed;Spoon Oral Phase Functional Implications: Impaired mastication;Right anterior spillage;Right lateral sulci pocketing BSE Assessment Risk for Aspiration Impact on safety and function: Mild aspiration risk Other Related Risk Factors: Cognitive impairment  Short Term Goals: Week 1: SLP Short Term Goal 1  (Week 1): STGs=LTGs due to ELOS  Refer to Care Plan for Long Term Goals  Recommendations for other services: None  Discharge Criteria: Patient will be discharged from SLP if patient refuses treatment 3 consecutive times without medical reason, if treatment goals not met, if there is a change in medical status, if patient makes no progress towards goals or if patient is discharged from hospital.  The above assessment, treatment plan, treatment alternatives and goals were discussed and mutually agreed upon: by patient  Cortasia Screws 03/28/2023, 4:22 PM

## 2023-03-28 NOTE — Plan of Care (Signed)
  Problem: RH Swallowing Goal: LTG Patient will consume least restrictive diet using compensatory strategies with assistance (SLP) Description: LTG:  Patient will consume least restrictive diet using compensatory strategies with assistance (SLP) Flowsheets (Taken 03/28/2023 1620) LTG: Pt Patient will consume least restrictive diet using compensatory strategies with assistance of (SLP): Modified Independent   Problem: RH Expression Communication Goal: LTG Patient will express needs/wants via multi-modal(SLP) Description: LTG:  Patient will express needs/wants via multi-modal communication (gestures/written, etc) with cues (SLP) Flowsheets (Taken 03/28/2023 1620) LTG: Patient will express needs/wants via multimodal communication (gestures/written, etc) with cueing (SLP): Moderate Assistance - Patient 50 - 74% Goal: LTG Patient will increase word finding of common (SLP) Description: LTG:  Patient will increase word finding of common objects/daily info/abstract thoughts with cues using compensatory strategies (SLP). Flowsheets (Taken 03/28/2023 1620) LTG: Patient will increase word finding of common (SLP): Maximal Assistance - Patient 25 - 49% Patient will use compensatory strategies to increase word finding of: Common objects   Problem: RH Problem Solving Goal: LTG Patient will demonstrate problem solving for (SLP) Description: LTG:  Patient will demonstrate problem solving for basic/complex daily situations with cues  (SLP) Flowsheets (Taken 03/28/2023 1620) LTG: Patient will demonstrate problem solving for (SLP): Basic daily situations LTG Patient will demonstrate problem solving for: Supervision   Problem: RH Awareness Goal: LTG: Patient will demonstrate awareness during functional activites type of (SLP) Description: LTG: Patient will demonstrate awareness during functional activites type of (SLP) 03/28/2023 1620 by Huston Foley, CCC-SLP Flowsheets (Taken 03/28/2023 1620) LTG: Patient will  demonstrate awareness during cognitive/linguistic activities with assistance of (SLP): Minimal Assistance - Patient > 75% 03/28/2023 1620 by Huston Foley, CCC-SLP Flowsheets (Taken 03/28/2023 1620) Patient will demonstrate during cognitive/linguistic activities awareness type of: Emergent LTG: Patient will demonstrate awareness during cognitive/linguistic activities with assistance of (SLP): Minimal Assistance - Patient > 75%

## 2023-03-28 NOTE — Progress Notes (Signed)
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   left cerebrum   Erectile dysfunction    Heart murmur    Hyperlipidemia    Schizophrenia (HCC)    Tobacco abuse    Past Surgical History:  Past Surgical History:  Procedure Laterality Date   BOWEL RESECTION N/A 09/04/2022   Procedure: SMALL BOWEL RESECTION, open;  Surgeon: Leafy Ro, MD;  Location: [facility] ORS;  Service: General;  Laterality: N/A;   IR CT HEAD LTD  03/21/2023   IR CT HEAD LTD  03/21/2023   IR PERCUTANEOUS ART THROMBECTOMY/INFUSION INTRACRANIAL INC DIAG ANGIO  03/21/2023   ORIF ORBITAL FRACTURE  1985   PARTIAL COLECTOMY Right 09/04/2022   Procedure: PARTIAL COLECTOMY;  Surgeon: Leafy Ro, MD;  Location: [facility] ORS;  Service: General;  Laterality: Right;  Right colectimy   RADIOLOGY WITH ANESTHESIA N/A 03/21/2023   Procedure: RADIOLOGY WITH ANESTHESIA;  Surgeon: Radiologist, Medication, MD;  Location: [facility] OR;  Service: Radiology;  Laterality: N/A;   TEE WITHOUT CARDIOVERSION N/A 07/19/2020   Procedure: TRANSESOPHAGEAL ECHOCARDIOGRAM (TEE);  Surgeon: Lamar Blinks, MD;  Location: [facility] ORS;  Service: Cardiovascular;  Laterality: N/A;   WRIST SURGERY Left 11/07/2013   REPAIR ULNAR ARTERY, NERVE AND TENDON   Social History:  reports that he has been smoking cigarettes. He has a 15.00 pack-year smoking history. He has been exposed to tobacco smoke. He quit smokeless tobacco use about 20 years ago. He reports current alcohol use of about 6.0 standard drinks of alcohol per week. He reports current drug use. Frequency: 7.00 times per week. Drugs:  "Crack" cocaine and Marijuana.  Family / Support Systems Marital Status: Single Spouse/Significant Other: N/A Children: No children Other Supports: None Anticipated Caregiver: mother Ability/Limitations of Caregiver: Pt mother can only provide supervision, no physical assistance as she reports she has her own physical health problems and does not drive. Caregiver Availability: 24/7 Family Dynamics: Pt lives with his mother  Social History Preferred language: English Religion: Christian Cultural Background: Unknown Education: GED Health Literacy - How often do you need to have someone help you when you read instructions, pamphlets, or other written material from your doctor or pharmacy?: Sometimes Writes: Yes Employment Status: Disabled Date Retired/Disabled/Unemployed: SSDI Marine scientist Issues: Admits to jail time atleast 4 yrs ago. Denies being on probation Guardian/Conservator: N/A   Abuse/Neglect Abuse/Neglect Assessment Can Be Completed: Unable to assess, patient is non-responsive or altered mental status Physical Abuse: Denies Verbal Abuse: Denies Sexual Abuse: Denies Exploitation of patient/patient's resources: Denies Self-Neglect: Denies  Patient response to: Social Isolation - How often do you feel lonely or isolated from those around you?: Patient unable to respond  Emotional Status Pt's affect, behavior and adjustment status: Pt in good spirits at time of visit. Pt understands SW questions but frustrated about not being able to communicate. Pt is aphasic and only able to answer yes/no questions Recent Psychosocial Issues: None Psychiatric History: hx of schizophrenia Substance Abuse History: Admits to smoking cigarettes daily; drinks beer atleast 3xs per week; and admits to marijuana and other drug use. (Per EMR, UDS + for cocaine and  marijuana).  Patient / Family Perceptions, Expectations & Goals Pt/Family understanding of illness & functional  limitations: Pt mother has a general understanding of pt care needs Premorbid pt/family roles/activities: Independent Anticipated changes in roles/activities/participation: Assistance with ADLs/IADLs Pt/family expectations/goals: Pt needs to be as physically independent as possible since his mother is unable to handle his care needs.  Community Resources [employer]: None Premorbid Home Care/DME Agencies: None Transportation available at discharge: TBD Is the patient able to respond to transportation needs?:  (unknown) In the past 12 months, has lack of transportation kept you from medical appointments or from getting medications?: No In the past 12 months, has lack of transportation kept you from meetings, work, or from getting things needed for daily living?: No Resource referrals recommended: Neuropsychology  Discharge Planning Living Arrangements: Parent Support Systems: Parent Type of Residence: Private residence Insurance Resources: [employer] (specify county) Surveyor, quantity Resources: [employer] Screen Referred: No Living Expenses: Lives with family Money Management: Family Does the patient have any problems obtaining your medications?: No Home Management: Pt and mother managed home care needs Patient/Family Preliminary Plans: TBD Care Coordinator Barriers to Discharge: Decreased caregiver support, Lack of/limited family support, Insurance for SNF coverage Care Coordinator Anticipated Follow Up Needs: HH/OP Expected length of stay: 7-10 days  Clinical Impression SW met with pt in room at bedside to introduce self, explain role, and discuss discharge process. Pt is not a Cytogeneticist. No HCPOA. No DME.   44- SW spoke with [name] to introduce self, explain role and discuss discharge process. She confirms he will return home with her. She reports she has her own health problems and has cancelled appointments to care for him. SW informed will follow-up once there is an  ELOS so she can try and reschedule her appointments if possible.    [id number] called [name] to inform on ELOS. She was frustrated asking about his rehab. SW informed he is currently in rehab at this time. She reported she is not able to physically care for him given her current health problems. When SW discussed family edu to see his progress, she reported she does not drive. SW shared will follow-up with updates after team conference on Tuesday.   Deren Degrazia A Seana Underwood 03/28/2023, 5:27 PM

## 2023-03-28 NOTE — Discharge Instructions (Addendum)
Inpatient Rehab Discharge Instructions  Brian Rios Discharge date and time: No discharge date for patient encounter.   Activities/Precautions/ Functional Status: Activity: activity as tolerated Diet: Regular Wound Care: Routine skin checks Functional status:  ___ No restrictions     ___ Walk up steps independently ___ 24/7 supervision/assistance   ___ Walk up steps with assistance ___ Intermittent supervision/assistance  ___ Bathe/dress independently ___ Walk with walker     _x__ Bathe/dress with assistance ___ Walk Independently    ___ Shower independently ___ Walk with assistance    ___ Shower with assistance ___ No alcohol     ___ Return to work/school ________  Special Instructions:  No driving smoking or alcoholSTROKE/TIA DISCHARGE INSTRUCTIONS SMOKING Cigarette smoking nearly doubles your risk of having a stroke & is the single most alterable risk factor  If you smoke or have smoked in the last 12 months, you are advised to quit smoking for your health. Most of the excess cardiovascular risk related to smoking disappears within a year of stopping. Ask you doctor about anti-smoking medications [place] Quit Line: 1-800-QUIT NOW Free Smoking Cessation Classes [phone]  CHOLESTEROL Know your levels; limit fat & cholesterol in your diet  Lipid Panel     Component Value Date/Time   CHOL 113 03/22/2023 0443   CHOL 156 07/18/2022 0951   TRIG 109 03/22/2023 0443   HDL 37 (L) 03/22/2023 0443   HDL 51 07/18/2022 0951   CHOLHDL 3.1 03/22/2023 0443   VLDL 22 03/22/2023 0443   LDLCALC 54 03/22/2023 0443   LDLCALC 87 07/18/2022 0951     Many patients benefit from treatment even if their cholesterol is at goal. Goal: Total Cholesterol (CHOL) less than 160 Goal:  Triglycerides (TRIG) less than 150 Goal:  HDL greater than 40 Goal:  LDL (LDLCALC) less than 100   BLOOD PRESSURE American Stroke Association blood pressure target is less that 120/80 mm/Hg  Your discharge blood  pressure is:  BP: 117/73 Monitor your blood pressure Limit your salt and alcohol intake Many individuals will require more than one medication for high blood pressure  DIABETES (A1c is a blood sugar average for last 3 months) Goal HGBA1c is under 7% (HBGA1c is blood sugar average for last 3 months)  Diabetes: No known diagnosis of diabetes    Lab Results  Component Value Date   HGBA1C 5.6 03/23/2023    Your HGBA1c can be lowered with medications, healthy diet, and exercise. Check your blood sugar as directed by your physician Call your physician if you experience unexplained or low blood sugars.  PHYSICAL ACTIVITY/REHABILITATION Goal is 30 minutes at least 4 days per week  Activity: Increase activity slowly, Therapies: Physical Therapy: Home Health Return to work:  Activity decreases your risk of heart attack and stroke and makes your heart stronger.  It helps control your weight and blood pressure; helps you relax and can improve your mood. Participate in a regular exercise program. Talk with your doctor about the best form of exercise for you (dancing, walking, swimming, cycling).  DIET/WEIGHT Goal is to maintain a healthy weight  Your discharge diet is:  Diet Order             DIET DYS 3 Fluid consistency: Thin  Diet effective now                   liquids Your height is:  Height: 5\' 10"  (177.8 cm) Your current weight is: Weight: 93.7 kg Your Body Mass Index (BMI)  is:  BMI (Calculated): 29.64 Following the type of diet specifically designed for you will help prevent another stroke. Your goal weight range is:   Your goal Body Mass Index (BMI) is 19-24. Healthy food habits can help reduce 3 risk factors for stroke:  High cholesterol, hypertension, and excess weight.  RESOURCES Stroke/Support Group:  Call [phone]   STROKE EDUCATION PROVIDED/REVIEWED AND GIVEN TO PATIENT Stroke warning signs and symptoms How to activate emergency medical system (call 911). Medications  prescribed at discharge. Need for follow-up after discharge. Personal risk factors for stroke. Pneumonia vaccine given: No Flu vaccine given: No My questions have been answered, the writing is legible, and I understand these instructions.  I will adhere to these goals & educational materials that have been provided to me after my discharge from the hospital.     COMMUNITY REFERRALS UPON DISCHARGE:    Outpatient: PT      OT     ST              Agency: [facility]          Phone: [phone]              Appointment Date/Time: *Please expect follow-up within 7-10 business days to schedule home visit. If you have not received follow-up, be sure to contact the site directly.*  Medical Equipment/Items Ordered:tub transfer bench                                                 Agency/Supplier:[facility] [phone]   My questions have been answered and I understand these instructions. I will adhere to these goals and the provided educational materials after my discharge from the hospital.  Patient/Caregiver Signature _______________________________ Date __________  Clinician Signature _______________________________________ Date __________  Please bring this form and your medication list with you to all your follow-up doctor's appointments.

## 2023-03-28 NOTE — Progress Notes (Signed)
PROGRESS NOTE   Subjective/Complaints:  No acute complaints.  No events overnight.Some mild difficulty sleeping but otherwise doing well.   ROS:  + Difficulty sleeping Denies fevers, chills, N/V, abdominal pain, constipation, diarrhea, SOB, cough, chest pain, new weakness or paraesthesias.    Objective:   No results found. Recent Labs    03/25/23 1009 03/26/23 0639  WBC 12.3* 11.6*  HGB 14.0 13.0  HCT 42.8 39.5  PLT 240 237   Recent Labs    03/25/23 1009 03/26/23 0639  NA 138 140  K 3.6 3.8  CL 104 106  CO2 22 22  GLUCOSE 120* 86  BUN 5* 6  CREATININE 1.07 1.10  CALCIUM 8.9 8.7*    Intake/Output Summary (Last 24 hours) at 03/28/2023 1005 Last data filed at 03/28/2023 0911 Gross per 24 hour  Intake 916 ml  Output 650 ml  Net 266 ml        Physical Exam: Vital Signs Blood pressure 117/73, pulse 79, temperature 98.1 F (36.7 C), temperature source Oral, resp. rate 18, height 5\' 10"  (1.778 m), weight 93.7 kg, SpO2 93 %.  Constitutional: No apparent distress. Appropriate appearance for age. Sitting up in bed.  HENT: No JVD. Neck Supple. Trachea midline. Atraumatic, normocephalic.  Eyes: +Bilateral exotropia. PERRL; cannot accomodate. EOMI in individual testing. Visual fields grossly intact.  Cardiovascular: RRR, no murmurs/rub/gallops. No Edema. Peripheral pulses 2+  Respiratory: CTAB. No rales, rhonchi, or wheezing. On RA.  Abdomen: + bowel sounds, normoactive. No distention or tenderness.  Skin: C/D/I. No apparent lesions.+ IV; c/d/i MSK:      No apparent deformity.      Strength:                RUE: 4/5 throughout                LUE: 5 out of 5 throughout                RLE: 5- out of 5 throughout                LLE:  5 out of 5 throughout  Neurologic exam:  Cognition: AAO to person, place, time and event.  Can follow 2/3 simple verbal commands, 3 /3 visual commands. Language: + Expressive aphasia,  difficulty word finding, and verbal apraxia. Mild dysarthria. Names 1/3 objects correctly.  Insight: Poor insight into current condition.  Mood: Pleasant affect, appropriate mood.  Sensation: Altered to light touch in left upper and lower extremity Reflexes: 2+ in BL UE and LEs. + LUE Hoffman's  .  CN: + Left facial droop; + visual deficits as above Coordination: No apparent tremors. No ataxia on FTN, HTS bilaterally.  Spasticity: MAS 1 in L elbow      Assessment/Plan: 1. Functional deficits which require 3+ hours per day of interdisciplinary therapy in a comprehensive inpatient rehab setting. Physiatrist is providing close team supervision and 24 hour management of active medical problems listed below. Physiatrist and rehab team continue to assess barriers to discharge/monitor patient progress toward functional and medical goals  Care Tool:  Bathing              Bathing  assist       Upper Body Dressing/Undressing Upper body dressing        Upper body assist      Lower Body Dressing/Undressing Lower body dressing            Lower body assist       Toileting Toileting    Toileting assist       Transfers Chair/bed transfer  Transfers assist     Chair/bed transfer assist level: Minimal Assistance - Patient > 75%     Locomotion Ambulation   Ambulation assist      Assist level: Minimal Assistance - Patient > 75% Assistive device: No Device Max distance: 200+   Walk 10 feet activity   Assist     Assist level: Minimal Assistance - Patient > 75% Assistive device: No Device   Walk 50 feet activity   Assist    Assist level: Minimal Assistance - Patient > 75% Assistive device: No Device    Walk 150 feet activity   Assist    Assist level: Minimal Assistance - Patient > 75% Assistive device: No Device    Walk 10 feet on uneven surface  activity   Assist     Assist level: Minimal Assistance - Patient > 75% Assistive device:  Other (comment) (No AD)   Wheelchair     Assist Is the patient using a wheelchair?: No             Wheelchair 50 feet with 2 turns activity    Assist            Wheelchair 150 feet activity     Assist          Blood pressure 117/73, pulse 79, temperature 98.1 F (36.7 C), temperature source Oral, resp. rate 18, height 5\' 10"  (1.778 m), weight 93.7 kg, SpO2 93 %.  Medical Problem List and Plan: 1. 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             -patient may shower             -ELOS/Goals: 5-7 days 2.  Antithrombotics: -DVT/anticoagulation:  Pharmaceutical: Lovenox             -antiplatelet therapy: Aspirin 81 mg daily and Brilinta 90 mg twice daily  3. Pain Management: Tylenol as needed  4. Mood/Behavior/Sleep: Cogentin 1 mg nightly, Depakote 750 mg nightly, Lexapro 20 mg daily, Remeron 45 milligrams nightly             -antipsychotic agents: Abilify 30 mg nightly, Seroquel 50 mg 3 times daily as needed  5. Neuropsych/cognition: This patient is not capable of making decisions on his own behalf.  6. Skin/Wound Care: Routine skin checks  7. Fluids/Electrolytes/Nutrition: Routine in and outs with follow-up chemistries -admission labs appropriate. Diet Orders (From admission, onward)     Start     Ordered   03/27/23 1458  DIET DYS 3 Fluid consistency: Thin  Diet effective now       Question:  Fluid consistency:  Answer:  Thin   03/27/23 1457             8.  Hypertension.  Lisinopril 10 mg daily.  Monitor with increased mobility.  Blood pressure well-controlled on current regimen, monitor.      03/28/2023    4:40 AM 03/27/2023  7:32 PM 03/27/2023    2:58 PM  Vitals with BMI  Height   5\' 10"   Weight   206 lbs 9 oz  BMI   29.64  Systolic 117 128 161  Diastolic 73 89  98  Pulse 79 88 100    9.  History of alcohol tobacco and drug use.  Urine drug screen positive cocaine as well as marijuana.  Continue NicoDerm patch.  Provide counseling   10.  Right lower lobe pneumonia.  Complete course of Unasyn.  Follow-up speech therapy   11.  Hyperlipidemia. 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 Diarrhea. LBM 6/5; improving.     LOS: 1 days A FACE TO FACE EVALUATION WAS PERFORMED  Angelina Sheriff 03/28/2023, 10:05 AM

## 2023-03-28 NOTE — Care Management (Signed)
Inpatient Rehabilitation Center Individual Statement of Services  Patient Name:  Brian Rios  Date:  03/28/2023  Welcome to the Inpatient Rehabilitation Center.  Our goal is to provide you with an individualized program based on your diagnosis and situation, designed to meet your specific needs.  With this comprehensive rehabilitation program, you will be expected to participate in at least 3 hours of rehabilitation therapies Monday-Friday, with modified therapy programming on the weekends.  Your rehabilitation program will include the following services:  Physical Therapy (PT), Occupational Therapy (OT), Speech Therapy (ST), 24 hour per day rehabilitation nursing, Therapeutic Recreaction (TR), Psychology, Neuropsychology, Care Coordinator, Rehabilitation Medicine, Nutrition Services, Pharmacy Services, and Other  Weekly team conferences will be held on Tuesdays to discuss your progress.  Your Inpatient Rehabilitation Care Coordinator will talk with you frequently to get your input and to update you on team discussions.  Team conferences with you and your family in attendance may also be held.  Expected length of stay: 7-10 days    Overall anticipated outcome: Supervision to Independent with Assistive Device  Depending on your progress and recovery, your program may change. Your Inpatient Rehabilitation Care Coordinator will coordinate services and will keep you informed of any changes. Your Inpatient Rehabilitation Care Coordinator's name and contact numbers are listed  below.  The following services may also be recommended but are not provided by the Inpatient Rehabilitation Center:  Driving Evaluations Home Health Rehabiltiation Services Outpatient Rehabilitation Services Vocational Rehabilitation   Arrangements will be made to provide these services after discharge if needed.  Arrangements include referral to agencies that provide these services.  Your insurance has been verified  to be:  Medicaid   Your primary doctor is:  Default Provider  Pertinent information will be shared with your doctor and your insurance company.  Inpatient Rehabilitation Care Coordinator:  Susie Cassette [phone] or [phone]  Information discussed with and copy given to patient by: Gretchen Short, 03/28/2023, 5:09 PM

## 2023-03-28 NOTE — Plan of Care (Signed)
Problem: RH Balance Goal: LTG Patient will maintain dynamic standing balance (PT) Description: LTG:  Patient will maintain dynamic standing balance with assistance during mobility activities (PT) Flowsheets (Taken 03/28/2023 1204) LTG: Pt will maintain dynamic standing balance during mobility activities with:: Supervision/Verbal cueing   Problem: Sit to Stand Goal: LTG:  Patient will perform sit to stand with assistance level (PT) Description: LTG:  Patient will perform sit to stand with assistance level (PT) Flowsheets (Taken 03/28/2023 1204) LTG: PT will perform sit to stand in preparation for functional mobility with assistance level: Independent with assistive device   Problem: RH Bed Mobility Goal: LTG Patient will perform bed mobility with assist (PT) Description: LTG: Patient will perform bed mobility with assistance, with/without cues (PT). Flowsheets (Taken 03/28/2023 1204) LTG: Pt will perform bed mobility with assistance level of: Independent   Problem: RH Bed to Chair Transfers Goal: LTG Patient will perform bed/chair transfers w/assist (PT) Description: LTG: Patient will perform bed to chair transfers with assistance (PT). Flowsheets (Taken 03/28/2023 1204) LTG: Pt will perform Bed to Chair Transfers with assistance level: Independent with assistive device    Problem: RH Car Transfers Goal: LTG Patient will perform car transfers with assist (PT) Description: LTG: Patient will perform car transfers with assistance (PT). Flowsheets (Taken 03/28/2023 1204) LTG: Pt will perform car transfers with assist:: Supervision/Verbal cueing   Problem: RH Furniture Transfers Goal: LTG Patient will perform furniture transfers w/assist (OT/PT) Description: LTG: Patient will perform furniture transfers  with assistance (OT/PT). Flowsheets (Taken 03/28/2023 1204) LTG: Pt will perform furniture transfers with assist:: Independent with assistive device    Problem: RH Floor Transfers Goal: LTG  Patient will perform floor transfers w/assist (PT) Description: LTG: Patient will perform floor transfers with assistance (PT). Flowsheets (Taken 03/28/2023 1204) LTG: PT WILL PERFORM FLOOR TRANFERS  WITH  ASSIST:: Supervision/Verbal cueing   Problem: RH Ambulation Goal: LTG Patient will ambulate in home environment (PT) Description: LTG: Patient will ambulate in home environment, # of feet with assistance (PT). Flowsheets (Taken 03/28/2023 1204) LTG: Pt will ambulate in home environ  assist needed:: Independent with assistive device LTG: Ambulation distance in home environment: 50' Goal: LTG Patient will ambulate in community environment (PT) Description: LTG: Patient will ambulate in community environment, # of feet with assistance (PT). Flowsheets (Taken 03/28/2023 1204) LTG: Pt will ambulate in community environ  assist needed:: Supervision/Verbal cueing LTG: Ambulation distance in community environment: 150'   Problem: RH Stairs Goal: LTG Patient will ambulate up and down stairs w/assist (PT) Description: LTG: Patient will ambulate up and down # of stairs with assistance (PT) Flowsheets (Taken 03/28/2023 1204) LTG: Pt will ambulate up/down stairs assist needed:: Supervision/Verbal cueing LTG: Pt will  ambulate up and down number of stairs: 12

## 2023-03-28 NOTE — Progress Notes (Signed)
Occupational Therapy Assessment and Plan  Patient Details  Name: Brian Rios MRN: [id number] Date of Birth: 1981-09-14  OT Diagnosis: apraxia, cognitive deficits, hemiplegia affecting dominant side, and muscle weakness (generalized) Rehab Potential: Rehab Potential (ACUTE ONLY): Excellent ELOS: 7-10 days   Today's Date: 03/28/2023 OT Individual Time: 4098-1191 OT Individual Time Calculation (min): 60 min     Hospital Problem: Principal Problem:   Left middle cerebral artery stroke 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   left cerebrum   Erectile dysfunction    Heart murmur    Hyperlipidemia    Schizophrenia (HCC)    Tobacco abuse    Past Surgical History:  Past Surgical History:  Procedure Laterality Date   BOWEL RESECTION N/A 09/04/2022   Procedure: SMALL BOWEL RESECTION, open;  Surgeon: Leafy Ro, MD;  Location: [facility] ORS;  Service: General;  Laterality: N/A;   IR CT HEAD LTD  03/21/2023   IR CT HEAD LTD  03/21/2023   IR PERCUTANEOUS ART THROMBECTOMY/INFUSION INTRACRANIAL INC DIAG ANGIO  03/21/2023   ORIF ORBITAL FRACTURE  1985   PARTIAL COLECTOMY Right 09/04/2022   Procedure: PARTIAL COLECTOMY;  Surgeon: Leafy Ro, MD;  Location: [facility] ORS;  Service: General;  Laterality: Right;  Right colectimy   RADIOLOGY WITH ANESTHESIA N/A 03/21/2023   Procedure: RADIOLOGY WITH ANESTHESIA;  Surgeon: Radiologist, Medication, MD;  Location: [facility] OR;  Service: Radiology;  Laterality: N/A;   TEE WITHOUT CARDIOVERSION N/A 07/19/2020   Procedure: TRANSESOPHAGEAL ECHOCARDIOGRAM (TEE);  Surgeon: Lamar Blinks, MD;  Location: [facility] ORS;  Service: Cardiovascular;  Laterality: N/A;   WRIST SURGERY Left 11/07/2013   REPAIR ULNAR ARTERY, NERVE AND TENDON    Assessment & Plan Clinical Impression: Patient is a 42 y.o. year old male with recent  admission to the hospital on 03/21/2023 to the [facility] ED today with acute onset of right sided weakness, right facial droop and dysphasia; pt transported to [facility] for emergent thrombectomy. TICI 2C revascularization was achieved. MRI reveals large left MCA stroke. 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 Patient transferred to CIR on 03/27/2023 .    Patient currently requires min with basic self-care skills secondary to muscle weakness, impaired timing and sequencing, unbalanced muscle activation, motor apraxia, and decreased coordination, decreased attention to right, decreased awareness, decreased problem solving, decreased safety awareness, and decreased memory, and decreased standing balance, hemiplegia, and decreased balance strategies.  Prior to hospitalization, patient could complete BADL with independent .  Patient will benefit from skilled intervention to increase independence with basic self-care skills prior to discharge home with care partner.  Anticipate patient will require 24 hour supervision and follow up outpatient.  OT - End of Session Endurance Deficit: Yes Endurance Deficit Description: Global deconditioning OT Assessment Rehab Potential (ACUTE ONLY): Excellent OT Patient demonstrates impairments in the following area(s): Balance;Behavior;Cognition;Endurance;Motor;Vision;Perception;Safety;Sensory OT Basic ADL's Functional Problem(s): Grooming;Bathing;Dressing;Toileting;Eating OT Transfers Functional Problem(s): Toilet;Tub/Shower OT Additional Impairment(s): Fuctional Use of Upper Extremity OT Plan OT Intensity: Minimum of 1-2 x/day, 45 to 90 minutes OT Frequency: 5 out of 7 days OT Duration/Estimated Length of Stay: 7-10 days OT Treatment/Interventions: Cognitive remediation/compensation;Balance/vestibular  training;Community reintegration;Discharge planning;Disease mangement/prevention;DME/adaptive equipment instruction;Functional electrical stimulation;Functional mobility training;Neuromuscular re-education;Pain management;Patient/family education;Psychosocial support;Self Care/advanced  ADL retraining;Skin care/wound managment;Splinting/orthotics;Therapeutic Activities;Therapeutic Exercise;UE/LE Strength taining/ROM;UE/LE Coordination activities;Visual/perceptual remediation/compensation;Wheelchair propulsion/positioning OT Self Feeding Anticipated Outcome(s): Set-up A OT Basic Self-Care Anticipated Outcome(s): supervsion/mod I OT Toileting Anticipated Outcome(s): Supervision OT Bathroom Transfers Anticipated Outcome(s): supervision OT Recommendation Patient destination: Home Follow Up Recommendations: Outpatient OT Equipment Recommended: To be determined   OT Evaluation Precautions/Restrictions  Precautions Precautions: Fall Restrictions Weight Bearing Restrictions: No General   Vital Signs Therapy Vitals Temp: 99 F (37.2 C) Temp Source: Oral Pulse Rate: 95 Resp: 18 BP: 127/72 Patient Position (if appropriate): Sitting Oxygen Therapy SpO2: 99 % O2 Device: Room Air Pain   Home Living/Prior Functioning Home Living Family/patient expects to be discharged to:: Private residence Available Help at Discharge: Family, Available PRN/intermittently Type of Home: Mobile home Home Access: Other (comment) (threshold step) Home Layout: One level Bathroom Shower/Tub: [employer] operational [employer] Accessibility: Yes Additional Comments: Patient with expressive aphasia- Home environment information needs to be verified with family  Lives With: Family (mother) Prior Function Level of Independence: Independent with basic ADLs, Independent with homemaking with ambulation, Independent with gait, Independent with transfers  Able to Take Stairs?: Yes Driving:  No Vocation: Unemployed Vocation Requirements: ` Leisure: Hobbies-yes (Comment) (Enjoys listening to music and spending time with family/friends) Vision Baseline Vision/History: 1 Wears glasses Ability to See in Adequate Light: 2 Moderately impaired Patient Visual Report: Diplopia;Blurring of vision Vision Assessment?: Vision impaired- to be further tested in functional context Additional Comments: Lt eye stay abducted however tracks with Rt eyes in all planes with slight delay in tracking. L inattention- Unsure of baseline. Perception  Inattention/Neglect: Impaired-to be further tested in functional context Praxis Praxis: Impaired Cognition Cognition Overall Cognitive Status: Difficult to assess Arousal/Alertness: Awake/alert Orientation Level: Person;Place;Situation Person: Oriented Place: Oriented Situation: Oriented Awareness: Impaired Problem Solving: Impaired Safety/Judgment: Impaired Brief Interview for Mental Status (BIMS) Repetition of Three Words (First Attempt): 3 Temporal Orientation: Year: Correct Temporal Orientation: Month: Accurate within 5 days Temporal Orientation: Day: Correct Recall: "Sock": Yes, no cue required Recall: "Blue": No, could not recall Recall: "Bed": No, could not recall BIMS Summary Score: 11 Sensation Sensation Light Touch: Impaired by gross assessment Additional Comments: impaired in the RUE Coordination Gross Motor Movements are Fluid and Coordinated: No Fine Motor Movements are Fluid and Coordinated: No Coordination and Movement Description: decreased coordination Motor  Motor Motor: Hemiplegia Motor - Skilled Clinical Observations: Mild Rt hemi (Rt UE>LE)  Trunk/Postural Assessment     Balance Static Sitting Balance Static Sitting - Balance Support: Feet supported Static Sitting - Level of Assistance: 6: Modified independent (Device/Increase time) Dynamic Sitting Balance Dynamic Sitting - Balance Support: Feet  supported Dynamic Sitting - Level of Assistance: 6: Modified independent (Device/Increase time) Static Standing Balance Static Standing - Balance Support: No upper extremity supported Static Standing - Level of Assistance: 4: Min assist;5: Stand by assistance Dynamic Standing Balance Dynamic Standing - Balance Support: During functional activity;No upper extremity supported Dynamic Standing - Level of Assistance: 4: Min assist Extremity/Trunk Assessment RUE Assessment RUE Assessment: Exceptions to [facility] Passive Range of Motion (PROM) Comments: WFL RUE Body System: Neuro Brunstrum levels for arm and hand: Arm;Hand Brunstrum level for arm: Stage IV Movement is deviating from synergy Brunstrum level for hand: Stage III Synergies performed voluntarily LUE Assessment LUE Assessment: Within Functional Limits  Care Tool Care Tool Self Care Eating   Eating Assist Level: Supervision/Verbal cueing    Oral Care    Oral Care Assist Level: Minimal Assistance - Patient > 75%  Bathing   Body parts bathed by patient: Right arm;Chest;Abdomen;Front perineal area;Buttocks;Right upper leg;Left upper leg;Face Body parts bathed by helper: Left lower leg;Right lower leg;Left arm   Assist Level: Minimal Assistance - Patient > 75%    Upper Body Dressing(including orthotics)   What is the patient wearing?: Pull over shirt   Assist Level: Minimal Assistance - Patient > 75%    Lower Body Dressing (excluding footwear)   What is the patient wearing?: Pants Assist for lower body dressing: Minimal Assistance - Patient > 75%    Putting on/Taking off footwear   What is the patient wearing?: Socks;Shoes Assist for footwear: Minimal Assistance - Patient > 75%       Care Tool Toileting Toileting activity   Assist for toileting: Minimal Assistance - Patient > 75%     Care Tool Bed Mobility Roll left and right activity        Sit to lying activity        Lying to sitting on side of bed activity          Care Tool Transfers Sit to stand transfer   Sit to stand assist level: Contact Guard/Touching assist    Chair/bed transfer   Chair/bed transfer assist level: Minimal Assistance - Patient > 75%     Toilet transfer   Assist Level: Minimal Assistance - Patient > 75%     Care Tool Cognition  Expression of Ideas and Wants Expression of Ideas and Wants: 2. Frequent difficulty - frequently exhibits difficulty with expressing needs and ideas  Understanding Verbal and Non-Verbal Content Understanding Verbal and Non-Verbal Content: 2. Sometimes understands - understands only basic conversations or simple, direct phrases. Frequently requires cues to understand   Memory/Recall Ability Memory/Recall Ability :  (unable to state due to aphasia)   Refer to Care Plan for Long Term Goals  SHORT TERM GOAL WEEK 1 OT Short Term Goal 1 (Week 1): LTG=STG 2/2 ELOS  Recommendations for other services: None    Skilled Therapeutic Intervention ADL ADL Eating: Supervision/safety Grooming: Minimal assistance Upper Body Bathing: Minimal assistance Lower Body Bathing: Minimal assistance Upper Body Dressing: Minimal assistance Lower Body Dressing: Minimal assistance Toileting: Minimal assistance Toilet Transfer: Minimal assistance Mobility  Bed Mobility Bed Mobility: Rolling Right;Rolling Left;Supine to Sit;Sit to Supine Rolling Right: Supervision/verbal cueing Rolling Left: Supervision/Verbal cueing Supine to Sit: Supervision/Verbal cueing Sit to Supine: Supervision/Verbal cueing Transfers Sit to Stand: Contact Guard/Touching assist Stand to Sit: Contact Guard/Touching assist   Discharge Criteria: Patient will be discharged from OT if patient refuses treatment 3 consecutive times without medical reason, if treatment goals not met, if there is a change in medical status, if patient makes no progress towards goals or if patient is discharged from hospital.  The above assessment, treatment  plan, treatment alternatives and goals were discussed and mutually agreed upon: by patient  Mal Amabile 03/28/2023, 3:25 PM

## 2023-03-28 NOTE — Plan of Care (Signed)
  Problem: Sit to Stand Goal: LTG:  Patient will perform sit to stand in prep for activites of daily living with assistance level (OT) Description: LTG:  Patient will perform sit to stand in prep for activites of daily living with assistance level (OT) Flowsheets (Taken 03/28/2023 1436) LTG: PT will perform sit to stand in prep for activites of daily living with assistance level: Independent with assistive device   Problem: RH Eating Goal: LTG Patient will perform eating w/assist, cues/equip (OT) Description: LTG: Patient will perform eating with assist, with/without cues using equipment (OT) Flowsheets (Taken 03/28/2023 1436) LTG: Pt will perform eating with assistance level of: Set up assist    Problem: RH Grooming Goal: LTG Patient will perform grooming w/assist,cues/equip (OT) Description: LTG: Patient will perform grooming with assist, with/without cues using equipment (OT) Flowsheets (Taken 03/28/2023 1436) LTG: Pt will perform grooming with assistance level of: Set up assist    Problem: RH Bathing Goal: LTG Patient will bathe all body parts with assist levels (OT) Description: LTG: Patient will bathe all body parts with assist levels (OT) Flowsheets (Taken 03/28/2023 1436) LTG: Pt will perform bathing with assistance level/cueing: Supervision/Verbal cueing   Problem: RH Dressing Goal: LTG Patient will perform upper body dressing (OT) Description: LTG Patient will perform upper body dressing with assist, with/without cues (OT). Flowsheets (Taken 03/28/2023 1436) LTG: Pt will perform upper body dressing with assistance level of: Supervision/Verbal cueing Goal: LTG Patient will perform lower body dressing w/assist (OT) Description: LTG: Patient will perform lower body dressing with assist, with/without cues in positioning using equipment (OT) Flowsheets (Taken 03/28/2023 1436) LTG: Pt will perform lower body dressing with assistance level of: Supervision/Verbal cueing   Problem: RH  Functional Use of Upper Extremity Goal: LTG Patient will use RT/LT upper extremity as a (OT) Description: LTG: Patient will use right/left upper extremity as a stabilizer/gross assist/diminished/nondominant/dominant level with assist, with/without cues during functional activity (OT) Flowsheets (Taken 03/28/2023 1436) LTG: Use of upper extremity in functional activities: RUE as gross assist level   Problem: RH Toilet Transfers Goal: LTG Patient will perform toilet transfers w/assist (OT) Description: LTG: Patient will perform toilet transfers with assist, with/without cues using equipment (OT) Flowsheets (Taken 03/28/2023 1436) LTG: Pt will perform toilet transfers with assistance level of: Supervision/Verbal cueing   Problem: RH Tub/Shower Transfers Goal: LTG Patient will perform tub/shower transfers w/assist (OT) Description: LTG: Patient will perform tub/shower transfers with assist, with/without cues using equipment (OT) Flowsheets (Taken 03/28/2023 1436) LTG: Pt will perform tub/shower stall transfers with assistance level of: Supervision/Verbal cueing

## 2023-03-29 NOTE — Progress Notes (Signed)
Speech Language Pathology Daily Session Note  Patient Details  Name: Brian Rios MRN: [id number] Date of Birth: [birth year]  Today's Date: 03/29/2023 SLP Individual Time: 1330-1430 SLP Individual Time Calculation (min): 60 min  Short Term Goals: Week 1: SLP Short Term Goal 1 (Week 1): STGs=LTGs due to ELOS  Skilled Therapeutic Interventions: Skilled treatment session focused on communication goals. SLP facilitated with a reading task at the word level. With more than a reasonable amount of time, patient read functional words from his communication board with 100% accuracy. Patient with mild phonemic paraphasias that patient was able to self-monitor and correct. Patient prefers to "work through it" himself but will eventually be responsive to cues with the most successful cues being articulatory cues or presenting similar words with the same articulatory/phonemic patterns which he then can carryover. Patient also read at the phrase level with 90% accuracy and Mod verbal cues and completed a phrase closure task with 70% accuracy. SLP encouraged patient to verbalize more consistently throughout day, especially with automatic or social responses. Patient verbalized understanding and then verbalized "bathroom." Patient ambulated to the commode with CGA. Patient left on commode with NT present. Continue with current plan of care.      Pain No/Denies Pain   Therapy/Group: Individual Therapy  [name] 03/29/2023, 3:58 PM

## 2023-03-29 NOTE — Progress Notes (Signed)
Orthopedic Tech Progress Note Patient Details:  Brian Rios [birth year] [id number]  Patient ID: Brian Rios, male   DOB: 1981/04/18, 42 y.o.   MRN: [id number] Order placed with [facility] for resting who.  Darleen Crocker 03/29/2023, 2:47 PM

## 2023-03-29 NOTE — Progress Notes (Signed)
Physical Therapy Session Note  Patient Details  Name: Brian Rios MRN: [id number] Date of Birth: 12-29-1980  Today's Date: 03/29/2023 PT Individual Time: 0800-0900 PT Individual Time Calculation (min): 60 min   Short Term Goals: Week 1:  PT Short Term Goal 1 (Week 1): STGs=LTGs secondary to ELOS  Skilled Therapeutic Interventions/Progress Updates:  Patient greeted sitting EOB in his room with NT present and agreeable to PT treatment session. Patient stood from EOB without the use of an AD and SBA- Patient gait trained to/from his room without AD and CGA/MinA. Patient continues to demonstrate mild instability due to Lt (and at times Rt) foot catching secondary to poor foot clearance.   Patient performed various activities in order to improve dynamic stability, strength and neuro re-ed in order to improve overall functional mobility and independence- -Patient weaved between x10 cones and stepped over x2 hurdles, x2 trials with CGA and VC for improved sequencing.  -Patient stepped laterally over x5 cones and x2 hurdles with CGA- VC for stepping over the items vs stepping around. -Patient performed alternating foot taps to 8" step without UE support and 4# weights donned to B ankles, x20 total with no LOB noted -Patient gait trained 2 x 180' with 4# weights donned ot B ankles and CGA/MinA- VC throughout for increased B step length and foot clearance for improved stability and overall safety; Patient then gait trained without the weights with improvements noted in gait mechanics and no moments of instability or poor foot clearance.  -Patient performed seated LAQ with 4# weights donned to B ankles, x20 total.  -Step-ups to 8" block with 4# weights donned to B ankles and CGA for safety- Completed x10 on each LE.  -Attempted to perform squat to mat table with alternating high-knees, however patient performed x5 and then was done with the activity. Patient was able to complete with SBA for  safety.  -Patient gait trained >200' without the use of an AD and CGA/MinA- VC throughout for improved postural extension/forward gaze and increased B foot clearance in order to improve overall stability; Patient with x3 instances of poor foot clearance leading to instability.   Patient left sitting EOB with bed alarm on, call bell within reach and all needs met.    Therapy Documentation Precautions:  Precautions Precautions: Fall Restrictions Weight Bearing Restrictions: No  Pain: No/Denies pain.   Therapy/Group: Individual Therapy  Dorothia Passmore 03/29/2023, 7:46 AM

## 2023-03-29 NOTE — Progress Notes (Signed)
Inpatient Rehabilitation  Patient information reviewed and entered into eRehab system by Persia Lintner Amahd Morino, OTR/L, Rehab Quality Coordinator.   Information including medical coding, functional ability and quality indicators will be reviewed and updated through discharge.   

## 2023-03-29 NOTE — Progress Notes (Signed)
PROGRESS NOTE   Subjective/Complaints:  No acute complaints.  No events overnight.  +BM overnight, medium, continent. Vitally stable  ROS:  + Difficulty sleeping - improved + Vision deficit - ongoing Denies fevers, chills, N/V, abdominal pain, constipation, diarrhea, SOB, cough, chest pain, new weakness or paraesthesias.    Objective:   No results found. Recent Labs    03/28/23 1038  WBC 12.6*  HGB 13.7  HCT 41.8  PLT 329    Recent Labs    03/28/23 1038  NA 137  K 3.9  CL 104  CO2 23  GLUCOSE 96  BUN 7  CREATININE 0.96  CALCIUM 9.0     Intake/Output Summary (Last 24 hours) at 03/29/2023 0915 Last data filed at 03/29/2023 0757 Gross per 24 hour  Intake 1179.34 ml  Output 550 ml  Net 629.34 ml         Physical Exam: Vital Signs Blood pressure 106/73, pulse 86, temperature 98.6 F (37 C), temperature source Oral, resp. rate 18, height 5\' 10"  (1.778 m), weight 93.7 kg, SpO2 100 %.  Constitutional: No apparent distress. Appropriate appearance for age. Sitting up at bedside listening to music.  HENT: No JVD. Neck Supple. Trachea midline. Atraumatic, normocephalic.  Eyes: +Bilateral exotropia. PERRL; cannot accomodate. EOMI in individual testing. Visual fields grossly intact.  Cardiovascular: RRR, no murmurs/rub/gallops. No Edema. Peripheral pulses 2+  Respiratory: CTAB. No rales, rhonchi, or wheezing. On RA.  Abdomen: + bowel sounds, normoactive. No distention or tenderness.  Skin: C/D/I. No apparent lesions.+ IV; c/d/i MSK:      No apparent deformity. + Estim R forearm.       Strength:                RUE: 4/5 throughout                LUE: 5 out of 5 throughout                RLE: 5- out of 5 throughout                LLE:  5 out of 5 throughout  Neurologic exam:  Cognition: AAO to person, place, time and event.  Can follow 2/3 simple verbal commands, 3 /3 visual commands. Language: + Expressive  aphasia, difficulty word finding, and verbal apraxia. Can write with some spelling errors but overall intelligable.  + Mild dysarthria. Names 1/3 objects correctly.  Insight: Poor insight into current condition.  Mood: Pleasant affect, appropriate mood.  Sensation: Altered to light touch in left upper and lower extremity Reflexes: 2+ in BL UE and LEs. + LUE Hoffman's  .  CN: + Left facial droop; + visual deficits as above Coordination: No apparent tremors. No ataxia on FTN, HTS bilaterally.  Spasticity: MAS 1 in L elbow, finger flexors      Assessment/Plan: 1. Functional deficits which require 3+ hours per day of interdisciplinary therapy in a comprehensive inpatient rehab setting. Physiatrist is providing close team supervision and 24 hour management of active medical problems listed below. Physiatrist and rehab team continue to assess barriers to discharge/monitor patient progress toward functional and medical goals  Care Tool:  Bathing  Body parts bathed by patient: Right arm, Chest, Abdomen, Front perineal area, Buttocks, Right upper leg, Left upper leg, Face   Body parts bathed by helper: Left lower leg, Right lower leg, Left arm     Bathing assist Assist Level: Minimal Assistance - Patient > 75%     Upper Body Dressing/Undressing Upper body dressing   What is the patient wearing?: Pull over shirt    Upper body assist Assist Level: Minimal Assistance - Patient > 75%    Lower Body Dressing/Undressing Lower body dressing      What is the patient wearing?: Pants     Lower body assist Assist for lower body dressing: Minimal Assistance - Patient > 75%     Toileting Toileting    Toileting assist Assist for toileting: Independent     Transfers Chair/bed transfer  Transfers assist     Chair/bed transfer assist level: Minimal Assistance - Patient > 75%     Locomotion Ambulation   Ambulation assist      Assist level: Minimal Assistance - Patient >  75% Assistive device: No Device Max distance: 200+   Walk 10 feet activity   Assist     Assist level: Minimal Assistance - Patient > 75% Assistive device: No Device   Walk 50 feet activity   Assist    Assist level: Minimal Assistance - Patient > 75% Assistive device: No Device    Walk 150 feet activity   Assist    Assist level: Minimal Assistance - Patient > 75% Assistive device: No Device    Walk 10 feet on uneven surface  activity   Assist     Assist level: Minimal Assistance - Patient > 75% Assistive device: Other (comment) (No AD)   Wheelchair     Assist Is the patient using a wheelchair?: No             Wheelchair 50 feet with 2 turns activity    Assist            Wheelchair 150 feet activity     Assist          Blood pressure 106/73, pulse 86, temperature 98.6 F (37 C), temperature source Oral, resp. rate 18, height 5\' 10"  (1.778 m), weight 93.7 kg, SpO2 100 %.  Medical Problem List and Plan: 1. 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             -patient may shower             -ELOS/Goals: 5-7 days   - 6/7: Rehab WHO ordered   2.  Antithrombotics: -DVT/anticoagulation:  Pharmaceutical: Lovenox             -antiplatelet therapy: Aspirin 81 mg daily and Brilinta 90 mg twice daily  3. Pain Management: Tylenol as needed  4. Mood/Behavior/Sleep: Cogentin 1 mg nightly, Depakote 750 mg nightly, Lexapro 20 mg daily, Remeron 45 milligrams nightly             -antipsychotic agents: Abilify 30 mg nightly, Seroquel 50 mg 3 times daily as needed -  required 1x 6/6  5. Neuropsych/cognition: This patient is not capable of making decisions on his own behalf.  6. Skin/Wound Care: Routine skin checks  7. Fluids/Electrolytes/Nutrition: Routine in and outs  with follow-up chemistries -admission labs appropriate. Diet Orders (From admission, onward)  Start     Ordered   03/27/23 1458  DIET DYS 3 Fluid consistency: Thin  Diet effective now       Question:  Fluid consistency:  Answer:  Thin   03/27/23 1457           - stop CBG checks 6/6   8.  Hypertension.  Lisinopril 10 mg daily.  Monitor with increased mobility.  Blood pressure well-controlled on current regimen, monitor.      03/29/2023    5:45 AM 03/28/2023    7:47 PM 03/28/2023    1:00 PM  Vitals with BMI  Systolic 106 109 161  Diastolic 73 74 72  Pulse 86 90 95    9.  History of alcohol tobacco and drug use.  Urine drug screen positive cocaine as well as marijuana.  Continue NicoDerm patch.  Provide counseling   10.  Right lower lobe pneumonia.  Complete course of Unasyn.  Follow-up speech therapy   11.  Hyperlipidemia. 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 Diarrhea. LBM 6/5; improving - resolved - LBM 6/6, formed    LOS: 2 days A FACE TO FACE EVALUATION WAS PERFORMED  Angelina Sheriff 03/29/2023, 9:15 AM

## 2023-03-29 NOTE — Progress Notes (Signed)
Occupational Therapy Session Note  Patient Details  Name: Brian Rios MRN: [id number] Date of Birth: 1981-04-27  Today's Date: 03/29/2023 OT Individual Time: 1015-1130 OT Individual Time Calculation (min): 75 min    Short Term Goals: Week 1:  OT Short Term Goal 1 (Week 1): LTG=STG 2/2 ELOS  Skilled Therapeutic Interventions/Progress Updates:    Pt greeted semi-reclined in bed asleep, easy to wake and agreeable to OT Treatment session focused on self-care retraining at shower level. Pt ambulated throughout session with CGA and no AD. Bathing completed from tub bench in shower with cues for safety and overall min A to integrate R UE as pt unable to grasp wash cloth, but could get R UE to underarms. OT educated on hemi dressing techniques with pt able to complete with min A. OT handed pt deodorant and he brought to his mouth, demonstrating ideational apraxia. Pt needed demonstration from OT to understand what the deodorant was used for. Pt ambulated to therapy gym without device and CGA. Addressed functional use of R UE with foam squeezing activity. Functional ambulation with focus on head turns and safety while ambultaing. OT set-up SAEBO e-stim for R UE wrist extensors. Incorporated grasp wiith squeezing foam when SAEBO cycled off.   SAEBO left on for 60 minutes.   Pt returned to room and sitting far back in bed with bed alarm on, call bell in reach, and needs met.  OT returned to remove SAEBO with skin intact and no adverse reactions.  [employer] Stim One 330 pulse width 35 Hz pulse rate On 8 sec/ off 8 sec Ramp up/ down 2 sec Symmetrical Biphasic wave form  Max intensity at 500 Ohm load   Therapy Documentation Precautions:  Precautions Precautions: Fall Restrictions Weight Bearing Restrictions: No Pain:  Denies pain   Therapy/Group: Individual Therapy  Mal Amabile 03/29/2023, 11:32 AM

## 2023-03-30 NOTE — Progress Notes (Signed)
Speech Language Pathology Daily Session Note  Patient Details  Name: Marley Charlot MRN: [id number] Date of Birth: [birth year]  Today's Date: 03/30/2023 SLP Individual Time: 1400-1450 SLP Individual Time Calculation (min): 50 min  Short Term Goals: Week 1: SLP Short Term Goal 1 (Week 1): STGs=LTGs due to ELOS  Skilled Therapeutic Interventions: Skilled treatment session focused on communication and dysphagia goals. SLP facilitated session by providing intermittent verbal cues for use of small sips of thin liquids via cup resulting in subtle and intermittent throat clearing. Recommend patient continue current diet. Patient able to read aloud at the word level with extra time and Mod visual and verbal cues to achieve 100% accuracy. Patient participated in an informal exchange regarding biographical information and hobbies/interests. Patient answered yes/no questions appropriately and required Mod verbal cues to answer basic questions at the word level regarding topics of interests. Patient also attempted to use written expression with decreased success. Patient left upright siting EOB with alarm on and all needs within reach. Continue with current plan of care.      Pain No/Denies Pain   Therapy/Group: Individual Therapy  Itzamara Casas 03/30/2023, 3:14 PM

## 2023-03-30 NOTE — Progress Notes (Signed)
Physical Therapy Session Note  Patient Details  Name: Brian Rios MRN: [id number] Date of Birth: 1981-03-16  Today's Date: 03/30/2023 PT Individual Time: 0800-0900 PT Individual Time Calculation (min): 60 min   Short Term Goals: Week 1:  PT Short Term Goal 1 (Week 1): STGs=LTGs secondary to ELOS  Skilled Therapeutic Interventions/Progress Updates:  Patient greeted supine in bed and agreeable to PT treatment session. Patient transitioned to sitting EOB ModI and stood without the use of an AD and supv. Patient gait trained to/from his room and main rehab gym without AD and CGA/MinA- Patient demonstrated improved foot clearance throughout gait trial with only once instance of poor foot clearance leading to minor instability.   Patient tasked with stepping over 5 hurdles (leading with Rt LE, leading with Lt LE and reciprocal stepping through the hurdles) with CGA and grabbing a letter and then placing it on the mat table in order to spell his name- Patient required minor cues for locating the letter "R" however was able to spell the rest of his name independently. Patient was also able to verbalize his name with cueing.   Patient then tasked with looking at the remaining letters on the tray table (~40 letters present) and spelling as many words as possible while standing unsupported. Patient was able to spell the below words with minor cues and increased time required to complete. Patient also required assistance for locating the letter "R" when spelling the below words- -Gum -Game -Jump -Bomb -Word -Sun -Sing -Night Patient was then tasked to verbalize the above 9 words (including his name) with increased time and min cues required.   Patient then gait trained back to his room with CGA/MinA- Patient with one instance of instability which is improved since yesterday. Patient left sitting EOB with bed alarm on, call bell within reach and all needs met.    Therapy  Documentation Precautions:  Precautions Precautions: Fall Restrictions Weight Bearing Restrictions: No  Pain: No/Denies pain.   Therapy/Group: Individual Therapy  Leonidas Boateng 03/30/2023, 7:47 AM

## 2023-03-30 NOTE — Progress Notes (Signed)
Occupational Therapy Session Note  Patient Details  Name: Brian Rios MRN: [id number] Date of Birth: 1981/03/27  Today's Date: 03/30/2023 OT Individual Time: 1116 -1200; 1300-1358 OT Individual Time Calculation (min): 102 min        Short Term Goals: Week 1:  OT Short Term Goal 1 (Week 1): LTG=STG 2/2 ELOS  Skilled Therapeutic Interventions/Progress Updates: Visit 1: Patient received sitting at the EOB, transferred to w/c with SBA and wheeled to therapy gym to work on [employer] neuro re-education. Patient able to answer yes no questions. No report of pain. PNF d1 d2 patterns. 3 Sets of 5 each with good task performance following initial AAROM/ stretch to ensure pain free ROM. Continued with assisted supination pronation holding 3" diameter ball. Patient required Max assist initially then progressed to only needing initial facilitation and assist to reach end range. Continued with assisted grasp release with Min assist initially into flexion and total assist initially into extension. Combined movements for assisted reach task reaching for a cone, stabilized by therapist, in a neutral forearm position with fingers assisted into neutral/towards extension to mimic pattern of reaching for a cup. Patient picked up the cone and stacked 6 cones 3 separate times with a rest between. Good participation and focus on UE activities throughout session. Continue with neuro re education of the RUE.  Visit 2: Patient seen for ADL as listed below. Followed with UE strengthening and coordination tasks. Using 2 lb dowel worked on active assist reach while maintaining grasp. Mod assist to maintain grasp for reach forward 2 x 10 and up 2 x 10. Continued with reach to L x 5 then R x 5. Good participation with self care retraining and UE neuro reeducation tasks. Continue with skilled OT POC.      Therapy Documentation Precautions:  Precautions Precautions: Fall Restrictions Weight Bearing Restrictions: No     Pain:0 c/o   ADL: ADL Eating: Supervision/safety Grooming: Minimal assistance Upper Body Bathing: Minimal assistance Lower Body Bathing: Minimal assistance Upper Body Dressing: Minimal assistance Lower Body Dressing: Minimal assistance Toileting: Supervision Toilet Transfer: SBA Shower Transfer: CGA    Therapy/Group: Individual Therapy  Warnell Forester 03/30/2023, 13:58 PM

## 2023-03-30 NOTE — Progress Notes (Signed)
PROGRESS NOTE   Subjective/Complaints:  No events overnight.  Seen eating breakfast with nursing assistance, some confusion regarding what utensils to use, ataxia making it difficult to hold utensils.  Has no complaints. Vitals stable Last BM 6/8, large, no incontinence.    ROS:  + Difficulty sleeping - improved + Vision deficit - ongoing Denies fevers, chills, N/V, abdominal pain, constipation, diarrhea, SOB, cough, chest pain, new weakness or paraesthesias.    Objective:   No results found. Recent Labs    03/28/23 1038  WBC 12.6*  HGB 13.7  HCT 41.8  PLT 329    Recent Labs    03/28/23 1038  NA 137  K 3.9  CL 104  CO2 23  GLUCOSE 96  BUN 7  CREATININE 0.96  CALCIUM 9.0     Intake/Output Summary (Last 24 hours) at 03/30/2023 0953 Last data filed at 03/30/2023 0729 Gross per 24 hour  Intake 1060 ml  Output --  Net 1060 ml         Physical Exam: Vital Signs Blood pressure 105/82, pulse 77, temperature 97.6 F (36.4 C), temperature source Oral, resp. rate 18, height 5\' 10"  (1.778 m), weight 93.7 kg, SpO2 98 %.  Constitutional: No apparent distress. Appropriate appearance for age.  Eating lunch tray. HENT: No JVD. Neck Supple. Trachea midline. Atraumatic, normocephalic.  Eyes: +Bilateral exotropia. PERRL; cannot accomodate. EOMI in individual testing. Visual fields grossly intact.  Cardiovascular: RRR, no murmurs/rub/gallops. No Edema. Peripheral pulses 2+  Respiratory: CTAB. No rales, rhonchi, or wheezing. On RA.  Abdomen: + bowel sounds, normoactive. No distention or tenderness.  Skin: C/D/I. No apparent lesions.+ IV; c/d/i MSK:      No apparent deformity      Strength: All 4 limbs antigravity against resistance.                RUE: 4/5 throughout                LUE: 5 out of 5 throughout                RLE: 5- out of 5 throughout                LLE:  5 out of 5 throughout  Neurologic exam:   Cognition: AAO to person, place, time and event.  Can follow 2/3 simple verbal commands, 3 /3 visual commands. Language: + Expressive aphasia, difficulty word finding, and verbal apraxia. Can write with some spelling errors but overall intelligable.  + Mild dysarthria. Names 1/3 objects correctly.  Insight: Poor insight into current condition.  Mood: Pleasant affect, appropriate mood.  Sensation: Altered to light touch in left upper and lower extremity Reflexes: 2+ in BL UE and LEs. + LUE Hoffman's  .  CN: + Left facial droop; + visual deficits as above Coordination: Bilateral upper extremity ataxia with intentional eating meal. Spasticity: MAS 1 in L elbow, finger flexors      Assessment/Plan: 1. Functional deficits which require 3+ hours per day of interdisciplinary therapy in a comprehensive inpatient rehab setting. Physiatrist is providing close team supervision and 24 hour management of active medical problems listed below. Physiatrist and rehab team  continue to assess barriers to discharge/monitor patient progress toward functional and medical goals  Care Tool:  Bathing    Body parts bathed by patient: Right arm, Chest, Abdomen, Front perineal area, Buttocks, Right upper leg, Left upper leg, Face   Body parts bathed by helper: Left lower leg, Right lower leg, Left arm     Bathing assist Assist Level: Minimal Assistance - Patient > 75%     Upper Body Dressing/Undressing Upper body dressing   What is the patient wearing?: Pull over shirt    Upper body assist Assist Level: Minimal Assistance - Patient > 75%    Lower Body Dressing/Undressing Lower body dressing      What is the patient wearing?: Pants     Lower body assist Assist for lower body dressing: Minimal Assistance - Patient > 75%     Toileting Toileting    Toileting assist Assist for toileting: Independent     Transfers Chair/bed transfer  Transfers assist     Chair/bed transfer assist level:  Minimal Assistance - Patient > 75%     Locomotion Ambulation   Ambulation assist      Assist level: Minimal Assistance - Patient > 75% Assistive device: No Device Max distance: 200+   Walk 10 feet activity   Assist     Assist level: Minimal Assistance - Patient > 75% Assistive device: No Device   Walk 50 feet activity   Assist    Assist level: Minimal Assistance - Patient > 75% Assistive device: No Device    Walk 150 feet activity   Assist    Assist level: Minimal Assistance - Patient > 75% Assistive device: No Device    Walk 10 feet on uneven surface  activity   Assist     Assist level: Minimal Assistance - Patient > 75% Assistive device: Other (comment) (No AD)   Wheelchair     Assist Is the patient using a wheelchair?: No             Wheelchair 50 feet with 2 turns activity    Assist            Wheelchair 150 feet activity     Assist          Blood pressure 105/82, pulse 77, temperature 97.6 F (36.4 C), temperature source Oral, resp. rate 18, height 5\' 10"  (1.778 m), weight 93.7 kg, SpO2 98 %.  Medical Problem List and Plan: 1. 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             -patient may shower             -ELOS/Goals: 5-7 days   - 6/7: Rehab WHO ordered   2.  Antithrombotics: -DVT/anticoagulation:  Pharmaceutical: Lovenox             -antiplatelet therapy: Aspirin 81 mg daily and Brilinta 90 mg twice daily  3. Pain Management: Tylenol as needed  4. Mood/Behavior/Sleep: Cogentin 1 mg nightly, Depakote 750 mg nightly, Lexapro 20 mg daily, Remeron 45 milligrams nightly             -antipsychotic agents: Abilify 30 mg nightly, Seroquel 50 mg 3 times daily as needed -  required 1x 6/6  -Well-controlled, continue current regimen and  start weaning medications next week  5. Neuropsych/cognition: This patient is not capable of making  decisions on his own behalf.  6. Skin/Wound Care: Routine skin checks  7. Fluids/Electrolytes/Nutrition: Routine in and outs with follow-up chemistries -admission labs appropriate. Diet Orders (From admission, onward)     Start     Ordered   03/27/23 1458  DIET DYS 3 Fluid consistency: Thin  Diet effective now       Question:  Fluid consistency:  Answer:  Thin   03/27/23 1457           - stop CBG checks 6/6   8.  Hypertension.  Lisinopril 10 mg daily.  Monitor with increased mobility.  Blood pressure well-controlled on current regimen, monitor. -6/8: Mildly hypotensive, DC his lisinopril      03/30/2023    6:08 AM 03/29/2023    7:10 PM 03/29/2023    1:21 PM  Vitals with BMI  Systolic 105 108 161  Diastolic 82 76 76  Pulse 77 95 83    9.  History of alcohol tobacco and drug use.  Urine drug screen positive cocaine as well as marijuana.  Continue NicoDerm patch.  Provide counseling   10.  Right lower lobe pneumonia.  Complete course of Unasyn.  Follow-up speech therapy   11.  Hyperlipidemia. 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 Diarrhea. LBM 6/5; improving - resolved - LBM 6/8    LOS: 3 days A FACE TO FACE EVALUATION WAS PERFORMED  Brian Rios 03/30/2023, 9:53 AM

## 2023-03-30 NOTE — IPOC Note (Signed)
Overall Plan of Care [facility]) Patient Details Name: Brian Rios MRN: [id number] DOB: Sep 03, 1981  Admitting Diagnosis: Left middle cerebral artery stroke [facility])  Hospital Problems: Principal Problem:   Left middle cerebral artery stroke [facility])     Functional Problem List: Nursing Safety, Bladder, Bowel, Endurance, Medication Management, Motor, Pain  PT Balance, Perception, Safety, Sensory, Endurance, Motor, Pain  OT Balance, Behavior, Cognition, Endurance, Motor, Vision, Perception, Safety, Sensory  SLP Cognition, Nutrition, Edema, Linguistic  TR         Basic ADL's: OT Grooming, Bathing, Dressing, Toileting, Eating     Advanced  ADL's: OT       Transfers: PT Bed Mobility, Bed to Chair, Car, [employer], Civil Service fast streamer, Research scientist (life sciences): PT Ambulation, Stairs     Additional Impairments: OT Fuctional Use of Upper Extremity  SLP Swallowing, Communication, Social Cognition comprehension, expression Social Interaction, Attention, Problem Solving, Awareness, Memory  TR      Anticipated Outcomes Item Anticipated Outcome  Self Feeding Set-up A  Swallowing  Supervision   Basic self-care  supervsion/mod I  Toileting  Supervision   Bathroom Transfers supervision  Bowel/Bladder  continent B/B  Transfers  Supv with LRAD  Locomotion  Supv with LRAD  Communication  Mod A for multimodal communication  Cognition  Supervision  Pain  less than 4  Safety/Judgment  fall free   Therapy Plan: PT Intensity: Minimum of 1-2 x/day ,45 to 90 minutes PT Frequency: 5 out of 7 days PT Duration Estimated Length of Stay: 7-10 days OT Intensity: Minimum of 1-2 x/day, 45 to 90 minutes OT Frequency: 5 out of 7 days OT Duration/Estimated Length of Stay: 7-10 days SLP Intensity: Minumum of 1-2 x/day, 30 to 90 minutes SLP Frequency: 1 to 3 out of 7 days SLP Duration/Estimated Length of Stay: 7-10 days   Team Interventions: Nursing Interventions Patient/Family  Education, Medication Management, Psychosocial Support, Bladder Management, Bowel Management, Disease Management/Prevention, Dysphagia/Aspiration Precaution Training, Pain Management, Discharge Planning  PT interventions Ambulation/gait training, Community reintegration, DME/adaptive equipment instruction, Neuromuscular re-education, Psychosocial support, Stair training, UE/LE Strength taining/ROM, Wheelchair propulsion/positioning, Warden/ranger, Discharge planning, Functional electrical stimulation, Pain management, Therapeutic Activities, UE/LE Coordination activities, Cognitive remediation/compensation, Disease management/prevention, Functional mobility training, Patient/family education, Splinting/orthotics, Therapeutic Exercise, Visual/perceptual remediation/compensation  OT Interventions Cognitive remediation/compensation, Warden/ranger, Community reintegration, Discharge planning, Disease mangement/prevention, DME/adaptive equipment instruction, Functional electrical stimulation, Functional mobility training, Neuromuscular re-education, Pain management, Patient/family education, Psychosocial support, Self Care/advanced ADL retraining, Skin care/wound managment, Splinting/orthotics, Therapeutic Activities, Therapeutic Exercise, UE/LE Strength taining/ROM, UE/LE Coordination activities, Visual/perceptual remediation/compensation, Wheelchair propulsion/positioning  SLP Interventions Cognitive remediation/compensation, Financial trader, Dysphagia/aspiration precaution training, Environmental controls, Functional tasks, Internal/external aids, Patient/family education, Therapeutic Activities, Speech/Language facilitation  TR Interventions    SW/CM Interventions Discharge Planning, Psychosocial Support, Patient/Family Education   Barriers to Discharge MD  Medical stability, Home enviroment access/loayout, Lack of/limited family support, and Insurance for SNF coverage  Nursing  Decreased caregiver support, IV antibiotics, Incontinence, Insurance for SNF coverage, Weight, Medication compliance lives with mom 1 level with level entry  [employer] for SNF coverage, Nutrition means, Home environment access/layout    OT      SLP      SW Decreased caregiver support, Lack of/limited family support, Community education officer for SNF coverage     Team Discharge Planning: Destination: PT-Home ,OT- Home , SLP-Home Projected Follow-up: PT-Outpatient PT, OT-  Outpatient OT, SLP-Outpatient SLP, 24 hour supervision/assistance, Home Health SLP Projected Equipment Needs: PT-To be determined, OT- To be determined,  SLP-None recommended by SLP Equipment Details: PT-TBD, OT-  Patient/family involved in discharge planning: PT- Patient,  OT-Patient, SLP-Patient  MD ELOS: 7-10 days Medical Rehab Prognosis:  Good Assessment: The patient has been admitted for CIR therapies with the diagnosis of L MCA stroke. The team will be addressing functional mobility, strength, stamina, balance, safety, adaptive techniques and equipment, self-care, bowel and bladder mgt, patient and caregiver education,. Goals have been set at supervision. Anticipated discharge destination is home.       See Team Conference Notes for weekly updates to the plan of care

## 2023-03-31 NOTE — Progress Notes (Signed)
PROGRESS NOTE   Subjective/Complaints:  No events overnight.  Overall doing well, speech improving  Has no complaints. Vitals stable Last BM 6/8, large, no incontinence.    ROS:  + Difficulty sleeping - improved + Vision deficit - ongoing Denies fevers, chills, N/V, abdominal pain, constipation, diarrhea, SOB, cough, chest pain, new weakness or paraesthesias.    Objective:   No results found. No results for input(s): "WBC", "HGB", "HCT", "PLT" in the last 72 hours.  No results for input(s): "NA", "K", "CL", "CO2", "GLUCOSE", "BUN", "CREATININE", "CALCIUM" in the last 72 hours.   Intake/Output Summary (Last 24 hours) at 03/31/2023 1126 Last data filed at 03/31/2023 0727 Gross per 24 hour  Intake 1200 ml  Output 900 ml  Net 300 ml         Physical Exam: Vital Signs Blood pressure 105/62, pulse 77, temperature 97.9 F (36.6 C), temperature source Oral, resp. rate 18, height 5\' 10"  (1.778 m), weight 93.7 kg, SpO2 96 %.  Constitutional: No apparent distress. Appropriate appearance for age.  Sitting up at bedside, listening to music HENT: No JVD. Neck Supple. Trachea midline. Atraumatic, normocephalic.  Eyes: +Bilateral exotropia. PERRL; cannot accomodate. EOMI in individual testing. Visual fields grossly intact.  Cardiovascular: RRR, no murmurs/rub/gallops. No Edema. Peripheral pulses 2+  Respiratory: CTAB. No rales, rhonchi, or wheezing. On RA.  Abdomen: + bowel sounds, normoactive. No distention or tenderness.  Skin: C/D/I. No apparent lesions.+ IV; c/d/i MSK:      No apparent deformity      Strength: All 4 limbs antigravity against resistance.                RUE: 4/5 throughout                LUE: 5 out of 5 throughout                RLE: 5- out of 5 throughout                LLE:  5 out of 5 throughout  Neurologic exam:  Cognition: AAO to person, place, time and event with options; can spontaneously state name  and month Language: + Expressive aphasia,  verbal apraxia. Can write with some spelling errors but overall intelligable-this is gradually improving,  + Mild dysarthria.  + Difficulty word finding-improving Insight: Poor insight into current condition.  Mood: Pleasant affect, appropriate mood.  Sensation: Altered to light touch in left upper and lower extremity Reflexes: 2+ in BL UE and LEs. + LUE Hoffman's  .  CN: + Left facial droop; + visual deficits as above Coordination: Bilateral upper extremity ataxia with intentional eating meal. Spasticity: MAS 1 in L elbow, finger flexors      Assessment/Plan: 1. Functional deficits which require 3+ hours per day of interdisciplinary therapy in a comprehensive inpatient rehab setting. Physiatrist is providing close team supervision and 24 hour management of active medical problems listed below. Physiatrist and rehab team continue to assess barriers to discharge/monitor patient progress toward functional and medical goals  Care Tool:  Bathing    Body parts bathed by patient: Right arm, Chest, Abdomen, Front perineal area, Buttocks, Right upper leg,  Left upper leg, Face   Body parts bathed by helper: Left lower leg, Right lower leg, Left arm     Bathing assist Assist Level: Minimal Assistance - Patient > 75%     Upper Body Dressing/Undressing Upper body dressing   What is the patient wearing?: Pull over shirt    Upper body assist Assist Level: Minimal Assistance - Patient > 75%    Lower Body Dressing/Undressing Lower body dressing      What is the patient wearing?: Pants     Lower body assist Assist for lower body dressing: Minimal Assistance - Patient > 75%     Toileting Toileting    Toileting assist Assist for toileting: Independent     Transfers Chair/bed transfer  Transfers assist     Chair/bed transfer assist level: Minimal Assistance - Patient > 75%     Locomotion Ambulation   Ambulation assist       Assist level: Minimal Assistance - Patient > 75% Assistive device: No Device Max distance: 200+   Walk 10 feet activity   Assist     Assist level: Minimal Assistance - Patient > 75% Assistive device: No Device   Walk 50 feet activity   Assist    Assist level: Minimal Assistance - Patient > 75% Assistive device: No Device    Walk 150 feet activity   Assist    Assist level: Minimal Assistance - Patient > 75% Assistive device: No Device    Walk 10 feet on uneven surface  activity   Assist     Assist level: Minimal Assistance - Patient > 75% Assistive device: Other (comment) (No AD)   Wheelchair     Assist Is the patient using a wheelchair?: No             Wheelchair 50 feet with 2 turns activity    Assist            Wheelchair 150 feet activity     Assist          Blood pressure 105/62, pulse 77, temperature 97.9 F (36.6 C), temperature source Oral, resp. rate 18, height 5\' 10"  (1.778 m), weight 93.7 kg, SpO2 96 %.  Medical Problem List and Plan: 1. 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             -patient may shower             -ELOS/Goals: 5-7 days   - 6/7: Rehab WHO ordered   2.  Antithrombotics: -DVT/anticoagulation:  Pharmaceutical: Lovenox             -antiplatelet therapy: Aspirin 81 mg daily and Brilinta 90 mg twice daily  3. Pain Management: Tylenol as needed  4. Mood/Behavior/Sleep: Cogentin 1 mg nightly, Depakote 750 mg nightly, Lexapro 20 mg daily, Remeron 45 milligrams nightly             -antipsychotic agents: Abilify 30 mg nightly, Seroquel 50 mg 3 times daily as needed -  required 1x 6/6  -Well-controlled, continue current regimen and start weaning medications next week  5. Neuropsych/cognition: This patient is not capable of  making decisions on his own behalf.  -Already on a few antidepressants, and cognitive speed improving.  Attention appropriate per therapy notes.  No change in medications at this time  6. Skin/Wound Care: Routine skin checks  7. Fluids/Electrolytes/Nutrition: Routine in and outs with follow-up chemistries -admission labs appropriate. Diet Orders (From admission, onward)     Start     Ordered   03/30/23 1813  DIET DYS 3 Fluid consistency: Thin  Diet effective now       Comments: Patient requesting cheeseburger at dinner time  Question:  Fluid consistency:  Answer:  Thin   03/30/23 1812           - stop CBG checks 6/6   8.  Hypertension.  Lisinopril 10 mg daily.  Monitor with increased mobility.  Blood pressure well-controlled on current regimen, monitor. -6/8: Mildly hypotensive, DC his lisinopril      03/31/2023    5:37 AM 03/30/2023    9:17 PM 03/30/2023    1:05 PM  Vitals with BMI  Systolic 105 115 409  Diastolic 62 84 67  Pulse 77 92 95    9.  History of alcohol tobacco and drug use.  Urine drug screen positive cocaine as well as marijuana.  Continue NicoDerm patch.  Provide counseling   10.  Right lower lobe pneumonia.  Complete course of Unasyn.  Follow-up speech therapy   11.  Hyperlipidemia. 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 Diarrhea. LBM 6/5; improving - resolved - LBM 6/8    LOS: 4 days A FACE TO FACE EVALUATION WAS PERFORMED  Brian Rios 03/31/2023, 11:26 AM

## 2023-04-01 DIAGNOSIS — F3341 Major depressive disorder, recurrent, in partial remission: Secondary | ICD-10-CM

## 2023-04-01 DIAGNOSIS — F209 Schizophrenia, unspecified: Secondary | ICD-10-CM | POA: Diagnosis not present

## 2023-04-01 DIAGNOSIS — I63512 Cerebral infarction due to unspecified occlusion or stenosis of left middle cerebral artery: Secondary | ICD-10-CM

## 2023-04-01 NOTE — Progress Notes (Signed)
Occupational Therapy Session Note  Patient Details  Name: Brian Rios MRN: [id number] Date of Birth: [birth year]  Today's Date: 04/01/2023 OT Individual Time: 0900-1000 OT Individual Time Calculation (min): 60 min    Short Term Goals: Week 1:  OT Short Term Goal 1 (Week 1): LTG=STG 2/2 ELOS  Skilled Therapeutic Interventions/Progress Updates:    Pt sitting EOB upon arrival. Pt agreeable to shower and changing clothing this morning. Amb to bathroom with CGA. Pt doffed clothing while standing with CGA for balance. Pt required assistance bathing LUE but completed all other bathing tasks without assistance. Abrasion to scrotum noted and RN notifed. Powder and barrier cream applied. Min verbal cues for safety while dressing. Instructed pt to sit in chair when threading underpants and pants. Pt required assistance tieing sweat pants but completed all other tasks without assitance. Pt amb to day room. Focus on RUE grasp and release. Saebo applied to RUE wrist/finger extensors to facilitate release segment of task. Saebo remained on pt at end of session. Pt returned to room and sat EOB to continue with grasp and release task.  Saebo Stim One Unattended 330 pulse width 35 Hz pulse rate On 8 sec/ off 8 sec Ramp up/ down 2 sec Symmetrical Biphasic wave form  Max intensity at 500 Ohm load  OTA returned to room upon completion and removed Saebo unit. No adverse reactions noted.   Therapy Documentation Precautions:  Precautions Precautions: Fall Restrictions Weight Bearing Restrictions: No   Pain:  Pt denies pain this morning ADL:   Therapy/Group: Individual Therapy  Rich Brave 04/01/2023, 10:02 AM

## 2023-04-01 NOTE — Progress Notes (Signed)
Speech Language Pathology Daily Session Note  Patient Details  Name: Derrious Bologna MRN: [id number] Date of Birth: Jun 21, 1981  Today's Date: 04/01/2023 SLP Individual Time: 1004-1100 SLP Individual Time Calculation (min): 56 min  Short Term Goals: Week 1: SLP Short Term Goal 1 (Week 1): STGs=LTGs due to ELOS  Skilled Therapeutic Interventions: Skilled treatment session focused on communication goals. SLP utilized script training to maximize word-finding and fluency with high-use phrases. Multiple short scripts were developed collaboratively in session. Today, the patient was able to read 100% of the phrases with extra time and Min-Mod verbal and visual cues to self-monitor and correct phonemic paraphasias. The scripts were then typed up and printed for patient to practice independently. Patient left upright in bed with alarm on and all needs within reach. Continue with current plan of care.      Pain No/Denies Pain   Therapy/Group: Individual Therapy  Elzy Tomasello 04/01/2023, 3:38 PM

## 2023-04-01 NOTE — Progress Notes (Signed)
PROGRESS NOTE   Subjective/Complaints: Aphasia, expressive Discussed pt with Neuropsych, adjustment ok given circumstances   ROS:  No pains, no breathing issues, limited ROS due to aphasia   Objective:   No results found. No results for input(s): "WBC", "HGB", "HCT", "PLT" in the last 72 hours.  No results for input(s): "NA", "K", "CL", "CO2", "GLUCOSE", "BUN", "CREATININE", "CALCIUM" in the last 72 hours.   Intake/Output Summary (Last 24 hours) at 04/01/2023 0847 Last data filed at 04/01/2023 0759 Gross per 24 hour  Intake 1056 ml  Output 400 ml  Net 656 ml         Physical Exam: Vital Signs Blood pressure 115/69, pulse 84, temperature 98 F (36.7 C), temperature source Oral, resp. rate 18, height 5\' 10"  (1.778 m), weight 93.7 kg, SpO2 97 %.  General: No acute distress Mood and affect are appropriate Heart: Regular rate and rhythm no rubs murmurs or extra sounds Lungs: Clear to auscultation, breathing unlabored, no rales or wheezes Abdomen: Positive bowel sounds, soft nontender to palpation, nondistended Extremities: No clubbing, cyanosis, or edema Skin: No evidence of breakdown, no evidence of rash  MSK:      No apparent deformity      Strength: All 4 limbs antigravity against resistance.                RUE: 4/5 except wrist and finger ext 2-, using SAEBO with OT                 LUE: 5 out of 5 throughout                RLE: 5- out of 5 throughout                LLE:  5 out of 5 throughout Motor apraxia noted during RLE MMT    Mild/mod dysarthria Oriented to person place day and month Names common objects such as pen and watch       Assessment/Plan: 1. Functional deficits which require 3+ hours per day of interdisciplinary therapy in a comprehensive inpatient rehab setting. Physiatrist is providing close team supervision and 24 hour management of active medical problems listed below. Physiatrist and  rehab team continue to assess barriers to discharge/monitor patient progress toward functional and medical goals  Care Tool:  Bathing    Body parts bathed by patient: Right arm, Chest, Abdomen, Front perineal area, Buttocks, Right upper leg, Left upper leg, Face   Body parts bathed by helper: Left lower leg, Right lower leg, Left arm     Bathing assist Assist Level: Minimal Assistance - Patient > 75%     Upper Body Dressing/Undressing Upper body dressing   What is the patient wearing?: Pull over shirt    Upper body assist Assist Level: Minimal Assistance - Patient > 75%    Lower Body Dressing/Undressing Lower body dressing      What is the patient wearing?: Pants     Lower body assist Assist for lower body dressing: Minimal Assistance - Patient > 75%     Toileting Toileting    Toileting assist Assist for toileting: Independent     Transfers Chair/bed transfer  Transfers assist     Chair/bed transfer assist level: Minimal Assistance - Patient > 75%     Locomotion Ambulation   Ambulation assist      Assist level: Minimal Assistance - Patient > 75% Assistive device: No Device Max distance: 200+   Walk 10 feet activity   Assist     Assist level: Minimal Assistance - Patient > 75% Assistive device: No Device   Walk 50 feet activity   Assist    Assist level: Minimal Assistance - Patient > 75% Assistive device: No Device    Walk 150 feet activity   Assist    Assist level: Minimal Assistance - Patient > 75% Assistive device: No Device    Walk 10 feet on uneven surface  activity   Assist     Assist level: Minimal Assistance - Patient > 75% Assistive device: Other (comment) (No AD)   Wheelchair     Assist Is the patient using a wheelchair?: No             Wheelchair 50 feet with 2 turns activity    Assist            Wheelchair 150 feet activity     Assist          Blood pressure 115/69, pulse 84,  temperature 98 F (36.7 C), temperature source Oral, resp. rate 18, height 5\' 10"  (1.778 m), weight 93.7 kg, SpO2 97 %.  Medical Problem List and Plan: 1. 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             -patient may shower             -ELOS/Goals: 5-7 days team conf in am    2.  Antithrombotics: -DVT/anticoagulation:  Pharmaceutical: Lovenox             -antiplatelet therapy: Aspirin 81 mg daily and Brilinta 90 mg twice daily  3. Pain Management: Tylenol as needed  4. Mood/Behavior/Sleep: Cogentin 1 mg nightly, Depakote 750 mg nightly, Lexapro 20 mg daily, Remeron 45 milligrams nightly             -hx of schizophrenia on antipsychotic agents: Abilify 30 mg nightly, Seroquel 50 mg 3 times daily as needed -  -Well-controlled, continue current regimen and start weaning medications next week  5. Neuropsych/cognition: This patient is not capable of making decisions on his own behalf.  -Already on a few antidepressants, and cognitive speed improving.  Attention appropriate per therapy notes.  No change in medications at this time  6. Skin/Wound Care: Routine skin checks  7. Fluids/Electrolytes/Nutrition: Routine in and outs with follow-up chemistries -admission labs appropriate. Diet Orders (From admission, onward)     Start     Ordered   03/30/23 1813  DIET DYS 3 Fluid consistency: Thin  Diet effective now       Comments: Patient requesting cheeseburger at dinner time  Question:  Fluid consistency:  Answer:  Thin   03/30/23 1812           - stop CBG checks 6/6   8.  Hypertension.  Lisinopril 10 mg daily.  Monitor with increased mobility.  Blood pressure well-controlled on current regimen, monitor. -6/8: Mildly hypotensive, DC his lisinopril      04/01/2023    5:12 AM 03/31/2023    7:56 PM 03/31/2023  1:26 PM  Vitals with BMI  Systolic 115 116 161  Diastolic 69 77 76  Pulse 84 106 89    9.  History of alcohol tobacco and drug use.  Urine drug screen positive cocaine as well as marijuana.  Continue NicoDerm patch.  Provide counseling   10.  Right lower lobe pneumonia.  Complete course of Unasyn.  Follow-up speech therapy   11.  Hyperlipidemia. 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 Diarrhea. LBM 6/5; improving - resolved - LBM 6/8    LOS: 5 days A FACE TO FACE EVALUATION WAS PERFORMED  Erick Colace 04/01/2023, 8:47 AM

## 2023-04-01 NOTE — Consult Note (Signed)
Neuropsychological Consultation Comprehensive Inpatient Rehab   Patient:   Brian Rios   DOB:   04-11-1981  MR Number:  [id number]  Location:  [facility] [address] CENTER A [id number] [place] [address] Dept: [phone] Loc: [phone]           Date of Service:   04/01/2023  Start Time:   8 AM End Time:   9 AM  Provider/Observer:  Arley Phenix, Psy.D.       Clinical Neuropsychologist       Billing Code/Service: [phone]  Reason for Service:    Brian Rios is a 42 year old male with past history of left basal ganglia embolic stroke in 2021, chronic stable schizophrenia maintained on Abilify, Depakote and Lexapro, alcohol and tobacco use and polysubstance use.  Patient has been prescribed Cogentin for changes/movement disorder and history of hyperlipidemia and bowel resection for benign mass in 2023.  Patient presented to [facility] on 03/21/2023 with acute onset of right-sided weakness with facial droop and aphasia.  Initial CT scan suggested possible acute infarct in the left posterior parietal lobe.  CTA showed occlusion of 2 proximal left M2 branches and mild left M1 stenosis.  MRI brain showed acute/subacute nonhemorrhagic stroke involving the majority of left insular cortex and posterior left frontal operculum extending into the left parietal lobe.  Once patient was stabilized therapy evaluations were done and patient admitted to CIR due to decreased functional mobility, right-sided weakness and aphasia.  Patient was awake and alert this morning sitting up in his bed.  Patient indicated that he understood that he had had a stroke but significant limits to expressive language noted with aphasia.  Patient is unable to speak more than 1 or 2 words and struggles with word finding at that.  Patient is not any better with written language.  Patient reported that his mood was  stable and there has not been an exacerbation of his underlying psychiatric status.  Patient continues on his home medications including Abilify, Depakote and Lexapro.  Patient acknowledged use of marijuana and cocaine as he was positive for urine drug screen at admission.  HPI for the current admission:    HPI: Brian Rios is a 42 year old right-handed male with history of alcohol/tobacco use, left basal ganglia embolic stroke in 2021, schizophrenia maintained on Abilify, Depakote, Lexapro as well as Cogentin, hyperlipidemia, 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 Presented 03/21/2023 to [facility] with acute onset of right-sided weakness facial droop and aphasia. Cranial CT scan worrisome for acute infarct in the posterior left parietal lobe. CTA showed occlusion of 2 proximal left M2 branches and mild left M1 stenosis. Widely patent carotid arteries. Admission chemistries unremarkable except potassium 3.2, glucose 127, WBC 13,400, hemoglobin A1c 5.6, alcohol negative, urine drug screen positive cocaine as well as marijuana. Patient underwent arteriogram with revascularization of occluded left MCA however it reoccluded. MRI of the brain showed interval increase in size of acute/subacute nonhemorrhagic infarct involving the majority the left insular cortex and posterior left frontal operculum extending into the high posterior left parietal lobe. No acute hemorrhage or significant mass effect. Echocardiogram ejection fraction of 60 to 65% no wall motion abnormalities. Patient placed on low-dose aspirin as well as Brilinta for CVA prophylaxis. Lovenox was added for DVT prophylaxis. Hospital course complicated by bouts of agitation and restlessness and patient did make 1 attempt to leave the hospital but was ultimately able to be redirected. Spiked a low-grade fever  100.7 WBC increasing to 14,700 chest x-ray revealed right lower lobe pneumonia initially placed on Zosyn transition to Unasyn. Maintained on  mechanical soft diet with thin liquids. Therapy evaluations completed due to patient's decreased functional ability right-sided weakness and aphasia was admitted for a comprehensive rehab program.   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   Stroke (cerebrum) (HCC) 03/21/2023   Middle cerebral artery embolism, 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   Follow-up exam 10/25/2022   Hospital discharge follow-up 09/20/2022   Small bowel mass 09/04/2022   S/P right colectomy 09/04/2022   Dental decay 06/12/2022   Chest pain 05/17/2022   Abnormal CT scan 12/28/2021   Elevated blood pressure reading 07/26/2021   Erectile dysfunction 07/26/2021   H/O elevated lipids 03/28/2021   Embolic stroke (HCC) 07/18/2020   Stroke (HCC) 07/17/2020   Drug abuse (HCC) 07/17/2020   Major depressive disorder, recurrent episode, severe (HCC) 02/20/2015   Alcohol use disorder, severe, dependence (HCC) 02/20/2015   Stimulant use disorder 02/20/2015   Tobacco use disorder 02/20/2015    Behavioral Observation/Mental Status:   Brian Rios  presents as a 42 y.o.-year-old Right handed African American Male who appeared his stated age. his dress was Appropriate and he was Well Groomed and his manners were Appropriate to the situation.  his participation was indicative of Appropriate behaviors.  There were physical disabilities noted.  he displayed an appropriate level of cooperation and motivation.    Interactions:    Active  Appropriate  Attention:   abnormal and attention span appeared shorter than expected for age  Memory:   within normal limits; recent and remote memory intact  Visuo-spatial:   not examined  Speech (Volume):  low  Speech:   non-fluent aphasia; garbled  Thought Process:  Coherent and Relevant  Concrete and Coherent  Though Content:  WNL; no hallucinations or delusions noted  Orientation:   person, place, time/date, and situation  Judgment:   Fair  Planning:   Fair  Affect:    Anxious  Mood:    Dysphoric  Insight:   Good  Intelligence:   normal  Psychiatric History:  Patient with significant past psychiatric history including chronic schizophrenia, major depressive disorder and currently on home medications.  History of Substance Use or Abuse:  There is a documented history of cocaine and marijuana abuse confirmed by the patient.    Family Med/Psych History:  Family History  Problem Relation Age of Onset   Alcoholism Mother    Hypertension Mother    Hyperlipidemia Mother    Breast cancer Mother    Alcoholism Father    Diabetes Maternal Grandmother    Impression/DX:   Brian Rios is a 42 year old male with past history of left basal ganglia embolic stroke in 2021, chronic stable schizophrenia maintained on Abilify, Depakote and Lexapro, alcohol and tobacco use and polysubstance use.  Patient has been prescribed Cogentin for changes/movement disorder and history of hyperlipidemia and  bowel resection for benign mass in 2023.  Patient presented to [facility] on 03/21/2023 with acute onset of right-sided weakness with facial droop and aphasia.  Initial CT scan suggested possible acute infarct in the left posterior parietal lobe.  CTA showed occlusion of 2 proximal left M2 branches and mild left M1 stenosis.  MRI brain showed acute/subacute nonhemorrhagic stroke involving the majority of left insular cortex and posterior left frontal operculum extending  into the left parietal lobe.  Once patient was stabilized therapy evaluations were done and patient admitted to CIR due to decreased functional mobility, right-sided weakness and aphasia.  Patient was awake and alert this morning sitting up in his bed.  Patient indicated that he understood that he had had a stroke but significant limits to expressive language noted with aphasia.  Patient is unable to speak more than 1 or 2 words and struggles with word finding at that.  Patient is not any better with written language.  Patient reported that his mood was stable and there has not been an exacerbation of his underlying psychiatric status.  Patient continues on his home medications including Abilify, Depakote and Lexapro.  Patient acknowledged use of marijuana and cocaine as he was positive for urine drug screen at admission.  Disposition/Plan:  Today we worked on coping and adjustment issues and reviewed current psychiatric status.  Patient with significant expressive language deficits limited to single words or very short phrases.  Addressed some strategies regarding use of expressive phrases.  Diagnosis:    Nonfluent aphasia with right-sided motor deficits.         Electronically Signed   _______________________ Arley Phenix, Psy.D. Clinical Neuropsychologist

## 2023-04-01 NOTE — Progress Notes (Deleted)
Patient ID: Brian Rios, male   DOB: Oct 09, 1981, 43 y.o.   MRN: [id number]  SW received updates from [facility] stating pt is active with services for HHPT/OT/SN. SW informed pt will need SLP at discharge. SW will follow-up to confirm d/c date.  Cecile Sheerer, MSW, LCSWA Office: [phone] Cell: [phone] Fax: [phone]

## 2023-04-01 NOTE — Progress Notes (Signed)
Physical Therapy Session Note  Patient Details  Name: Brian Rios MRN: [id number] Date of Birth: [birth year]  Today's Date: 04/01/2023 PT Individual Time: 1335-1450 PT Individual Time Calculation (min): 75 min   Short Term Goals: Week 1:  PT Short Term Goal 1 (Week 1): STGs=LTGs secondary to ELOS  Skilled Therapeutic Interventions/Progress Updates:  Patient greeted sitting EOB and agreeable to PT treatment session. Patient gait trained to/from his room and dayroom without the use of an AD and CGA for safety- Patient demonstrated x1 minor LOB secondary to poor foot clearance, however not stabilizing assistance required from therapist.   Patient tasked with ambulating ~10' to/from table and stand with cards- Patient was to grab a card with his Rt hand and then walk to the card stand to find its match. Once he located its match he was then tasked with verbalizing the color and number/suit. Patient completed x6 and x7 with an extended seated rest break in between secondary to fatigue. Patient required Mod cues for color and number/suit. With increased time and repetition, patient was able to verbalize each card. Patient used a strategy of bringing the cards to the edge of the table with his Rt hand in order to use a pinch grasp to pick the card up from the table- Increased time and trials required for each card.   Patient gait trained >250' without the use of an AD and CGA- VC for increased cadence and improved foot clearance with good effort noted and only one instance of poor foot clearance noted.   Patient left sitting EOB in his room with bed alarm on, call bell within reach, NT present taking vital and all needs met.    Therapy Documentation Precautions:  Precautions Precautions: Fall Restrictions Weight Bearing Restrictions: No  Pain: No/Denies pain.    Therapy/Group: Individual Therapy  [name] 04/01/2023, 7:43 AM

## 2023-04-02 MED ORDER — NYSTATIN 100000 UNIT/GM EX POWD
Freq: Two times a day (BID) | CUTANEOUS | Status: AC
  Filled 2023-04-02: qty 15

## 2023-04-02 NOTE — Patient Care Conference (Signed)
Inpatient RehabilitationTeam Conference and Plan of Care Update Date: 04/02/2023   Time: 10:24 AM   Patient Name: Brian Rios      Medical Record Number: [id number]  Date of Birth: Nov 15, 1980 Sex: Male         Room/Bed: 4W15C/4W15C-01 Payor Info: Payor: MEDICAID [place] / Plan: MEDICAID [place] ACCESS / Product Type: *No Product type* /    Admit Date/Time:  03/27/2023  2:52 PM  Primary Diagnosis:  Left middle cerebral artery stroke [facility])  Hospital Problems: Principal Problem:   Left middle cerebral artery stroke [facility]) Active Problems:   Recurrent major depressive disorder, in partial remission (HCC)   Chronic schizophrenia [facility])    Expected Discharge Date: Expected Discharge Date: 04/10/23  Team Members Present: Physician leading conference: Dr. Elijah Birk Social Worker Present: Cecile Sheerer, LCSWA Nurse Present: Vedia Pereyra, RN PT Present: Amedeo Plenty, PT OT Present: Jake Shark, OT SLP Present: Feliberto Gottron, SLP     Current Status/Progress Goal Weekly Team Focus  Bowel/Bladder    Continent of B/B   Remain continent of B/B  Toilet every 4 hours and PRN    Swallow/Nutrition/ Hydration   Dys. 3 textures with thin liquids via cup, Supervision verbal cues for use of strategies   Mod I  tolerance of current diet, use of swallowing strategies, trials of regular textures    ADL's   bathing at shower level with min A; UB dressing with supervision; LB dressing with min A; toileting with CGA; CGA for amb without AD; mod verbal cues for safety awareness; pt initiating RUE use during all tasks   supervision overall   Safety awareness standng balance, RUE function, education Barriers: requires CGA/min A for ADLs 2/2 decreased safety awareness. Will need 24 hour supervision 2/2 aphasia    Mobility   ModI for bed mobility; SBA for sit/stand and transfers without AD; CGA/MinA for gait >180' without the use of an AD; CGA for stair mobility with S HR- Limited by  awareness/effort throughout treatment sessions   ModI/Supv with LRAD  Barriers: Continues to require CGA/MinA for gait due to impaired awareness and motor planning, patient needs to be distant supv/ModI for dc home with mother. Continue to address gait without AD, dynamic stability, global strengthening endurance/activity tolerance and NMR in order to improve independence with functional mobility for a safe discharge home    Communication   Supervision-Min A verbal and visual cues for auditory comprehension. Mod-Max A multimodal cues and extra time to verbalize at word and phrase level. Using multimodal communication to express wnats/needs with Mod-Max A verbal cues. A mild barrier is that patient likes to work through things himself and not always responsive to cues.   Mod-Max A for functional communication   use of script training, mass practice to improve fluency, continue use of multimodal communicaton to minimize frustration and communication breakdowns. increase responsiveness to cues    Safety/Cognition/ Behavioral Observations  min A   Supervision   safety awareness with functional tasks, self-monitoring and correting errors    Pain     Denies pain  Remain pain free  Assess every 4 hours and PRN   Skin     Yeast/MASD to groin and peri area-applying powder to affected areas   Affected areas will improve  Assess every shift and PRN      Discharge Planning:  Pt to return home to his mother's home. He needs to be mod I to supervision only as his mothes is not physically about to assist  him. His mother does not drive. Pt already established with HH with [facility] for HHPT/OT/SN. Request to add SLP. SW will confirm there are no barriers to discharge.   Team Discussion: Left MCA/CVA. Blood pressure improving with bilateral TED hose. Continent B/B. Denies pain. Working to improve skin integrity. D3/thin liquids with trials of reg diet though continues to be impulsive with PO intake.  Encourage fluids. Therapies working to get patient to higher functional level with OT/PT due to mother being unable to assist once home. Working to improve safety awareness, standing balance, gait without device.  Endurance, motor planning with dynamic stability and balance as well as strength training for safe discharge to home.  Patient on target to meet rehab goals: yes, patient continues to progress with goals. Discharge date 04/10/23  *See Care Plan and progress notes for long and short-term goals.   Revisions to Treatment Plan:  Nystatin powder to affected areas BID. Monitor labs/VS.   Teaching Needs: Medications, safety, self care, skin care, gait/transfer training, etc.   Current Barriers to Discharge: Wound care, Lack of/limited family support, Medication compliance, and Behavior  Possible Resolutions to Barriers: Education via handouts and set up with [facility]. Maintain skin integrity. Compliant with medications. Order recommended DME if needed.       Medical Summary Current Status: medically complicated by intermittent tachycardia, hemineglect, hypotension, schizophrenia  Barriers to Discharge: Electrolyte abnormality;Hypotension;Medical stability;Self-care education;Spasticity;Behavior/Mood  Barriers to Discharge Comments: hypotension/tachycardia, cognitive deficits in setting of severeal medications for schizophrenia Possible Resolutions to [employer] Focus: wean cognitive effecting medicaitons as tolerated, monitor vitals, encourage fluids   Continued Need for Acute Rehabilitation Level of Care: The patient requires daily medical management by a physician with specialized training in physical medicine and rehabilitation for the following reasons: Direction of a multidisciplinary physical rehabilitation program to maximize functional independence : Yes Medical management of patient stability for increased activity during participation in an intensive rehabilitation regime.:  Yes Analysis of laboratory values and/or radiology reports with any subsequent need for medication adjustment and/or medical intervention. : Yes   I attest that I was present, lead the team conference, and concur with the assessment and plan of the team.   Jearld Adjutant 04/02/2023, 2:29 PM

## 2023-04-02 NOTE — Progress Notes (Signed)
Physical Therapy Session Note  Patient Details  Name: Cloy Cozzens MRN: [id number] Date of Birth: 1981-03-11  Today's Date: 04/02/2023 PT Individual Time: 0900-1000 PT Individual Time Calculation (min): 60 min   Short Term Goals: Week 1:  PT Short Term Goal 1 (Week 1): STGs=LTGs secondary to ELOS  Skilled Therapeutic Interventions/Progress Updates:  Patient greeted supine in bed and agreeable to PT treatment session. Patient transitioned to EOB ModI and then stood from EOB without the use of an AD and SBA. Patient gait trained from 4th floor, on/off the elevators, down to the first floor and outside on various surfaces with CGA overall for improved safety. Throughout gait trial, patient was tasked with reading signs in order to locate "gift shop," as well as verbalizing the gift shop and the floor it's located on. Patient was able to locate gift shop with MinA overall for using external cues (ie signs). Patient gait trained outside on various surfaces (grass, concrete, mulch, up/down stairs, up/down ramp, threshold step, carpet, etc.) all with CGA for safety and minor VC for improved B foot clearance with mild shuffling noted. Throughout gait trials patient was tasked with verbalizing various colors of plants/flowers with prompting from therapist required, however able to repeat words with increased time required. Therapist and patient discussed LOS, CLOF, progress, etc while taking a seated rest break outside. Patient returned to his room and left sitting EOB with bed alarm on, call bell within reach and all needs met.    Therapy Documentation Precautions:  Precautions Precautions: Fall Restrictions Weight Bearing Restrictions: No  Pain: No/Denies pain.    Therapy/Group: Individual Therapy  Daquavion Catala 04/02/2023, 7:50 AM

## 2023-04-02 NOTE — Progress Notes (Signed)
Patient ID: Brian Rios, male   DOB: 1981/08/18, 42 y.o.   MRN: [id number]  SW met with pt in room to provide updates from team conference on gains made, and d/c date 6/19. Pt aware SW will follow-up with his mother.   1558- SW left message for pt mother Brian Rios to provide updates from team conference.   Cecile Sheerer, MSW, LCSWA Office: [phone] Cell: [phone] Fax: [phone]

## 2023-04-02 NOTE — Progress Notes (Signed)
Speech Language Pathology Daily Session Note  Patient Details  Name: Brian Rios MRN: [id number] Date of Birth: Mar 03, 1981  Today's Date: 04/02/2023 SLP Individual Time: 1330-1415 SLP Individual Time Calculation (min): 45 min  Short Term Goals: Week 1: SLP Short Term Goal 1 (Week 1): STGs=LTGs due to ELOS  Skilled Therapeutic Interventions: Skilled treatment session focused on dysphagia and communication goals. SLP facilitated session by providing trials of regular textures. Patient demonstrated efficient mastication with mild oral residue that cleared with a liquid wash. No overt s/s of aspiration. Recommend patient upgrade to regular textures. Patient requested to do his laundry. Patient ambulated to the washing machine with CGA but there was no detergent available. Therefore, patient returned to his room. Patient saw the full supervision sign outside his door and asked appropriate questions regarding need. SLP provided education and patient verbalized understanding but will need reinforcement. SLP also facilitated session with script training. Patient able to independently verbalize responses with extra time and overall Min A verbal cues.  Patient with improved fluency throughout task. Patient also spontaneously verbalized at the sentence level X 3. Patient left upright sitting EOB with alarm on and all needs within reach. Continue with current plan of care.      Pain No/Denies Pain   Therapy/Group: Individual Therapy  Vianny Schraeder 04/02/2023, 3:10 PM

## 2023-04-02 NOTE — Progress Notes (Signed)
Occupational Therapy Session Note  Patient Details  Name: Asani Mcburney MRN: [id number] Date of Birth: 21-Mar-1981  Today's Date: 04/02/2023 OT Individual Time: 0700-0800 OT Individual Time Calculation (min): 60 min    Short Term Goals: Week 1:  OT Short Term Goal 1 (Week 1): LTG=STG 2/2 ELOS  Skilled Therapeutic Interventions/Progress Updates:    Pt resting in bed upon arrival. Initial focus on self feeding incorporating RUE to assist. Pt initiates RUE use during self feeding tasks. Pt required min verbal cues for swallowing strategies. Pt more verbal this morning with complete phrases. Pt amb to sink to wash face using BUE. Amb to day room. Focus on RUE use to grasp and toss bean bags at target. Pt able to complete grasp/release but with limited control of velocity of toss. Saebo unit applied on RUE wrist/finger extensors at end of session.  Saebo Stim One unattended 330 pulse width 35 Hz pulse rate On 8 sec/ off 8 sec Ramp up/ down 2 sec Symmetrical Biphasic wave form  Max intensity at 500 Ohm load  OTA returned and removed Saebo upon completeion. No adverse reactions noted.    Therapy Documentation Precautions:  Precautions Precautions: Fall Restrictions Weight Bearing Restrictions: No  Pain:  Pt denies pain this morning  Therapy/Group: Individual Therapy  Rich Brave 04/02/2023, 11:31 AM

## 2023-04-02 NOTE — Progress Notes (Signed)
PROGRESS NOTE   Subjective/Complaints:  No events overnight. No acute complaints. Mood is good, no depression or anxiety endorsed.  Per nursing, yeast vs MASD to groin. Patient endorses discomfort.   ROS:  + Rash - groin No pains, no breathing issues, limited ROS due to aphasia   Objective:   No results found. No results for input(s): "WBC", "HGB", "HCT", "PLT" in the last 72 hours.  No results for input(s): "NA", "K", "CL", "CO2", "GLUCOSE", "BUN", "CREATININE", "CALCIUM" in the last 72 hours.   Intake/Output Summary (Last 24 hours) at 04/02/2023 1025 Last data filed at 04/02/2023 0700 Gross per 24 hour  Intake 540 ml  Output --  Net 540 ml         Physical Exam: Vital Signs Blood pressure 113/78, pulse 90, temperature (!) 97.5 F (36.4 C), temperature source Oral, resp. rate 18, height 5\' 10"  (1.778 m), weight 93.7 kg, SpO2 98 %.  General: No acute distress. Sitting up at bedside.  Flat affect , mood are appropriate. +Impulsivity - hits the same button 10+ times when asked to enter a phone password until prompted to slow down and enter it.  Heart: Regular rate and rhythm no rubs murmurs or extra sounds Lungs: Clear to auscultation, breathing unlabored, no rales or wheezes Abdomen: Positive bowel sounds, soft nontender to palpation, nondistended. Abdominal scar, well healed Extremities: No clubbing, cyanosis, or edema Skin: No evidence of breakdown. + Maculopappular rash along R>L inner groin  MSK:      No apparent deformity      Strength: All 4 limbs antigravity against resistance.                RUE: 4/5 except wrist and finger ext 2                LUE: 5 out of 5 throughout                RLE: 5- out of 5 throughout                LLE:  5 out of 5 throughout Motor apraxia noted during RLE MMT MAS 1+ R finger flexors Mild/mod dysarthria Oriented to person place day and month with options; difficulty  wordfinding spontaneously      Assessment/Plan: 1. Functional deficits which require 3+ hours per day of interdisciplinary therapy in a comprehensive inpatient rehab setting. Physiatrist is providing close team supervision and 24 hour management of active medical problems listed below. Physiatrist and rehab team continue to assess barriers to discharge/monitor patient progress toward functional and medical goals  Care Tool:  Bathing    Body parts bathed by patient: Right arm, Chest, Abdomen, Front perineal area, Buttocks, Right upper leg, Left upper leg, Face, Right lower leg, Left lower leg   Body parts bathed by helper: Left arm     Bathing assist Assist Level: Minimal Assistance - Patient > 75%     Upper Body Dressing/Undressing Upper body dressing   What is the patient wearing?: Pull over shirt    Upper body assist Assist Level: Supervision/Verbal cueing    Lower Body Dressing/Undressing Lower body dressing      What  is the patient wearing?: Pants, Underwear/pull up     Lower body assist Assist for lower body dressing: Contact Guard/Touching assist     Toileting Toileting    Toileting assist Assist for toileting: Independent     Transfers Chair/bed transfer  Transfers assist     Chair/bed transfer assist level: Minimal Assistance - Patient > 75%     Locomotion Ambulation   Ambulation assist      Assist level: Minimal Assistance - Patient > 75% Assistive device: No Device Max distance: 200+   Walk 10 feet activity   Assist     Assist level: Minimal Assistance - Patient > 75% Assistive device: No Device   Walk 50 feet activity   Assist    Assist level: Minimal Assistance - Patient > 75% Assistive device: No Device    Walk 150 feet activity   Assist    Assist level: Minimal Assistance - Patient > 75% Assistive device: No Device    Walk 10 feet on uneven surface  activity   Assist     Assist level: Minimal Assistance -  Patient > 75% Assistive device: Other (comment) (No AD)   Wheelchair     Assist Is the patient using a wheelchair?: No             Wheelchair 50 feet with 2 turns activity    Assist            Wheelchair 150 feet activity     Assist          Blood pressure 113/78, pulse 90, temperature (!) 97.5 F (36.4 C), temperature source Oral, resp. rate 18, height 5\' 10"  (1.778 m), weight 93.7 kg, SpO2 98 %.  Medical Problem List and Plan: 1. 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             -patient may shower             -ELOS/Goals: 5-7 days - SPV goals PT/OT, Min A SLP goals - 6/19 DC date  - 6/11: Per SW, family not willing to come in for family education, patient must be fully independent d/t mother being only family support and she is disabled. CGA for gait, poor awareness, anticipate SPV for community mobility at discharge. SLP somewhat impulsive with POS, some penetration/aspirations on thin liquids. Full SPV with self feeding due to impulsivity. Verbalizing more consistently, more apraxia than aphasia.    2.  Antithrombotics: -DVT/anticoagulation:  Pharmaceutical: Lovenox             -antiplatelet therapy: Aspirin 81 mg daily and Brilinta 90 mg twice daily  3. Pain Management: Tylenol as needed  4. Mood/Behavior/Sleep: Cogentin 1 mg nightly, Depakote 750 mg nightly, Lexapro 20 mg daily, Remeron 45 milligrams nightly             -hx of schizophrenia on antipsychotic agents: Abilify 30 mg nightly, Seroquel 50 mg 3 times daily as needed - DC PRN  -6/11: D/w patient medications, mood; well-controlled, continue current regimen    5. Neuropsych/cognition: This patient is not capable of making decisions on his own behalf.  -Already on a few antidepressants, and cognitive speed improving.   Attention appropriate per therapy notes.  No change in medications at this time  6. Skin/Wound Care: Routine skin checks   - 6.11: Groin rash - likely  yeast. Nystatin powder x5 days.   7. Fluids/Electrolytes/Nutrition/dysphagia: Routine in and outs with follow-up chemistries -admission labs appropriate. Diet Orders (From admission, onward)     Start     Ordered   03/30/23 1813  DIET DYS 3 Fluid consistency: Thin  Diet effective now       Comments: Patient requesting cheeseburger at dinner time  Question:  Fluid consistency:  Answer:  Thin   03/30/23 1812           - stop CBG checks 6/6   8.  Hypertension.  Lisinopril 10 mg daily.  Monitor with increased mobility.  Blood pressure well-controlled on current regimen, monitor. -6/8: Mildly hypotensive, DC his lisinopril 6/11: Stable; monitor      04/02/2023    4:47 AM 04/01/2023    7:36 PM 04/01/2023    2:55 PM  Vitals with BMI  Systolic 113 120 604  Diastolic 78 75 87  Pulse 90 94 101    9.  History of alcohol tobacco and drug use.  Urine drug screen positive cocaine as well as marijuana.  Continue NicoDerm patch.  Provide counseling   10.  Right lower lobe pneumonia.  Complete course of Unasyn.  Follow-up speech therapy   - 6.11: WBC stable 12-11; vitals stable, no s/s infection   11.  Hyperlipidemia. 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 Diarrhea. LBM 6/5; improving - resolved - LBM 6/10     LOS: 6 days A FACE TO FACE EVALUATION WAS PERFORMED  Angelina Sheriff 04/02/2023, 10:25 AM

## 2023-04-02 NOTE — Progress Notes (Signed)
Physical Therapy Session Note  Patient Details  Name: Brian Rios MRN: [id number] Date of Birth: May 26, 1981  Today's Date: 04/02/2023 PT Individual Time: 4098-1191 PT Individual Time Calculation (min): 43 min   Short Term Goals: Week 1:  PT Short Term Goal 1 (Week 1): STGs=LTGs secondary to ELOS  Skilled Therapeutic Interventions/Progress Updates:      Pt sitting EOB upon arrival. Pt agreeable to therapy. Pt denies any pain/dizziness.  Pt ambulated multiple bouts of 150+ feet with no AD and CGA, verbal cues provided to not cross feet.   Pt ascended 12 steps with B UE support, reciprocal gait and CGA, verbal cues for R UE grasp on handrail.   Pt performed following activity to improve R UE grasp, word finding, and safety with turning. Pt had 6 different colored dots (red, green, yellow, blue, purple, and orange) with a corresponding colored clip, squigy, bean bag, cone. Pt picked up object one at a time and walked 5 feet to place objecting with corresponding colored dot, with CGA. verbal cues provided for safety with turns, to not cross feet, moderate instructional verbal cues provided for adjusting grasp depending on the object, maximal verbal cues with therapist saying the color starts with b for blue or providing the prefix to color if unable to recall color with first letter only.   Pt seated EOB at end of session with bed alarm on and needs within reach   Therapy Documentation Precautions:  Precautions Precautions: Fall Restrictions Weight Bearing Restrictions: No Therapy/Group: Individual Therapy  [facility] Ambrose Finland, , DPT  04/02/2023, 7:31 AM

## 2023-04-03 NOTE — Progress Notes (Signed)
Patient ID: Brian Rios, male   DOB: [birth year], 42 y.o.   MRN: [id number]   [id number]- SW left message for pt mother Brian Rios to provide updates from team conference, and requested return phone call.   Cecile Sheerer, MSW, LCSWA Office: [phone] Cell: [phone] Fax: [phone]

## 2023-04-03 NOTE — Progress Notes (Signed)
Physical Therapy Session Note  Patient Details  Name: Damek Ende MRN: [id number] Date of Birth: 1981-05-12  Today's Date: 04/03/2023 PT Individual Time: 0800-0855 PT Individual Time Calculation (min): 55 min   Short Term Goals: Week 1:  PT Short Term Goal 1 (Week 1): STGs=LTGs secondary to ELOS  Skilled Therapeutic Interventions/Progress Updates:    Chart reviewed and pt agreeable to therapy. Pt received seated EOB with no c/o pain. Session focused on amb quality and high level amb to promote safe home access. Pt initiated session with amb of >1061ft using close S and no AD across 1st floor and outdoor terrains including hills, uneven surfaces, grass, gravel, stairs, and crosswalks. Pt noted to have good balance t/o and good attention to safety with street crossing. Pt then completed series of balance exercises with amb including amb with unilateral ball toss using LUE and weight ball hold with RUE all with close S. Plt noted to have 2 minor stumbles of RLE toe catching. Pt returned to room and changed gown with S and set up A. Session education emphasized safety awareness of fatigue in RLE and resting when needed to avoid LOB. At end of session, pt was left seated EOB with alarm engaged, nurse call bell and all needs in reach.     Therapy Documentation Precautions:  Precautions Precautions: Fall Restrictions Weight Bearing Restrictions: No     Therapy/Group: Individual Therapy  Dionne Milo, PT, DPT 04/03/2023, 8:59 AM

## 2023-04-03 NOTE — Progress Notes (Signed)
Physical Therapy Session Note  Patient Details  Name: Brian Rios MRN: [id number] Date of Birth: 07-17-1981  Today's Date: 04/03/2023 PT Individual Time: 0930-1030 PT Individual Time Calculation (min): 60 min   Short Term Goals: Week 1:  PT Short Term Goal 1 (Week 1): STGs=LTGs secondary to ELOS  Skilled Therapeutic Interventions/Progress Updates:  Patient greeted supine in bed and agreeable to PT treatment session, however notably frustrated as patient wants to wash his clothes but the detergent has not arrived yet. Educated patient that it was delivered yesterday and should arrive on our floor this afternoon and he will be able to wash his clothes tomorrow morning. Patient agreeable to changing into paper scrubs for the time being- Patient provided with a change of clothes and went into the bathroom to change. Patient attempted to change in standing, however was encouraged to sit down in order to thread his pants for improved safety with good listening and ability noted. Patient gait trained throughout the 4th floor without the use of an AD and CGA/close SBA for safety- Patient initially demonstrating poor foot clearance leading to x3 minor LOBs however with VC improved foot clearance patient was able to demonstrate improvements.   Patient tasked with ambulating throughout the hallway without the use of an AD and CGA/SBA for safety- While ambulating patient was tasked with turning his head Lt and Rt in order to locate various colored/numbered discs. Patient tasked with locating the discs in numerical order (1-12) and to verbalize each number, as well as state the color of the disc. Patient was able to locate all numbers in sequential order without any assistance and then was able to verbalize the number and colors with Min/Mod cues- At times patient was able to say the number/color automatically without any prompting. Patient required ~30 minutes to complete this task.   Patient wanted to  practice verbalizing the numbers one more time so during his seated rest break patient went through numbers 1-12 again with increased difficulty noted with "three."   Patient returned to his room and left sitting EOB with bed alarm on, call bell within reach and all needs met.    Therapy Documentation Precautions:  Precautions Precautions: Fall Restrictions Weight Bearing Restrictions: No  Pain: No/Denies pain.  Therapy/Group: Individual Therapy  [name] 04/03/2023, 7:53 AM

## 2023-04-03 NOTE — Progress Notes (Signed)
PROGRESS NOTE   Subjective/Complaints:  No events overnight. No acute complaints. Difficult to interview due to expressive aphasia. He denies any concerns, complaints today.    ROS:  + Rash - groin - ongoing No pains, no breathing issues, limited ROS due to aphasia   Objective:   No results found. No results for input(s): "WBC", "HGB", "HCT", "PLT" in the last 72 hours.  No results for input(s): "NA", "K", "CL", "CO2", "GLUCOSE", "BUN", "CREATININE", "CALCIUM" in the last 72 hours.   Intake/Output Summary (Last 24 hours) at 04/03/2023 0847 Last data filed at 04/03/2023 0500 Gross per 24 hour  Intake 120 ml  Output 900 ml  Net -780 ml         Physical Exam: Vital Signs Blood pressure 121/68, pulse 86, temperature 98.7 F (37.1 C), resp. rate 16, height 5\' 10"  (1.778 m), weight 93.7 kg, SpO2 98 %.  General: No acute distress. Reclining in bed listening to music.  Flat affect , mood are appropriate.   Heart: Regular rate and rhythm no rubs murmurs or extra sounds Lungs: Clear to auscultation, breathing unlabored, no rales or wheezes Abdomen: Positive bowel sounds, soft nontender to palpation, nondistended. Abdominal scar, well healed Extremities: No clubbing, cyanosis, or edema Skin: No evidence of breakdown. + Maculopappular rash along R>L inner groin - not examined today  MSK:      No apparent deformity      Strength: All 4 limbs antigravity against resistance.                RUE: 4/5 except wrist and finger ext 2 - unchanged                LUE: 5 out of 5 throughout                RLE: 5- out of 5 throughout                LLE:  5 out of 5 throughout MAS 1+ R finger flexors Mild/mod dysarthria Oriented to person place day and month with options; difficulty wordfinding spontaneously, can get 3/3 with extended time, remembers 3/3 with cues but cannot spontaneously reproduce names      Assessment/Plan: 1.  Functional deficits which require 3+ hours per day of interdisciplinary therapy in a comprehensive inpatient rehab setting. Physiatrist is providing close team supervision and 24 hour management of active medical problems listed below. Physiatrist and rehab team continue to assess barriers to discharge/monitor patient progress toward functional and medical goals  Care Tool:  Bathing    Body parts bathed by patient: Right arm, Chest, Abdomen, Front perineal area, Buttocks, Right upper leg, Left upper leg, Face, Right lower leg, Left lower leg   Body parts bathed by helper: Left arm     Bathing assist Assist Level: Minimal Assistance - Patient > 75%     Upper Body Dressing/Undressing Upper body dressing   What is the patient wearing?: Pull over shirt    Upper body assist Assist Level: Supervision/Verbal cueing    Lower Body Dressing/Undressing Lower body dressing      What is the patient wearing?: Pants, Underwear/pull up  Lower body assist Assist for lower body dressing: Contact Guard/Touching assist     Toileting Toileting    Toileting assist Assist for toileting: Independent     Transfers Chair/bed transfer  Transfers assist     Chair/bed transfer assist level: Minimal Assistance - Patient > 75%     Locomotion Ambulation   Ambulation assist      Assist level: Minimal Assistance - Patient > 75% Assistive device: No Device Max distance: 200+   Walk 10 feet activity   Assist     Assist level: Minimal Assistance - Patient > 75% Assistive device: No Device   Walk 50 feet activity   Assist    Assist level: Minimal Assistance - Patient > 75% Assistive device: No Device    Walk 150 feet activity   Assist    Assist level: Minimal Assistance - Patient > 75% Assistive device: No Device    Walk 10 feet on uneven surface  activity   Assist     Assist level: Minimal Assistance - Patient > 75% Assistive device: Other (comment) (No AD)    Wheelchair     Assist Is the patient using a wheelchair?: No             Wheelchair 50 feet with 2 turns activity    Assist            Wheelchair 150 feet activity     Assist          Blood pressure 121/68, pulse 86, temperature 98.7 F (37.1 C), resp. rate 16, height 5\' 10"  (1.778 m), weight 93.7 kg, SpO2 98 %.  Medical Problem List and Plan: 1. 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             -patient may shower             -ELOS/Goals: 5-7 days - SPV goals PT/OT, Min A SLP goals - 6/19 DC date  - 6/11: Per SW, family not willing to come in for family education, patient must be fully independent d/t mother being only family support and she is disabled. CGA for gait, poor awareness, anticipate SPV for community mobility at discharge. SLP somewhat impulsive with POS, some penetration/aspirations on thin liquids. Full SPV with self feeding due to impulsivity. Verbalizing more consistently, more apraxia than aphasia.    2.  Antithrombotics: -DVT/anticoagulation:  Pharmaceutical: Lovenox             -antiplatelet therapy: Aspirin 81 mg daily and Brilinta 90 mg twice daily  3. Pain Management: Tylenol as needed  4. Mood/Behavior/Sleep: Cogentin 1 mg nightly, Depakote 750 mg nightly, Lexapro 20 mg daily, Remeron 45 milligrams nightly             -hx of schizophrenia on antipsychotic agents: Abilify 30 mg nightly, Seroquel 50 mg 3 times daily as needed - DC PRN  -6/11-12: D/w patient medications, mood; well-controlled, continue current regimen    5. Neuropsych/cognition: This patient is not capable of making decisions on his own behalf.  -Already on a few antidepressants, and cognitive speed improving.  Attention appropriate per therapy notes.  No change in medications at this  time  6. Skin/Wound Care: Routine skin checks   - 6/11: Groin rash - likely yeast. Nystatin powder x5 days.   7. Fluids/Electrolytes/Nutrition/dysphagia: Routine in and outs with follow-up  chemistries -admission labs appropriate. Diet Orders (From admission, onward)     Start     Ordered   04/02/23 1507  Diet regular Room service appropriate? Yes; Fluid consistency: Thin  Diet effective now       Question Answer Comment  Room service appropriate? Yes   Fluid consistency: Thin      04/02/23 1507           - stop CBG checks 6/6   8.  Hypertension.  Lisinopril 10 mg daily.  Monitor with increased mobility.  Blood pressure well-controlled on current regimen, monitor. -6/8: Mildly hypotensive, DC his lisinopril 6/11-12: Stable; monitor      04/03/2023    5:05 AM 04/02/2023    7:55 PM 04/02/2023    2:24 PM  Vitals with BMI  Systolic 121 120 161  Diastolic 68 70 72  Pulse 86 86 90    9.  History of alcohol tobacco and drug use.  Urine drug screen positive cocaine as well as marijuana.  Continue NicoDerm patch.  Provide counseling   10.  Right lower lobe pneumonia.  Complete course of Unasyn.  Follow-up speech therapy   - 6/11: WBC stable 12-11; vitals stable, no s/s infection   11.  Hyperlipidemia. 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 Diarrhea. LBM 6/5; improving - resolved - LBM 6/10     LOS: 7 days A FACE TO FACE EVALUATION WAS PERFORMED  Angelina Sheriff 04/03/2023, 8:47 AM

## 2023-04-03 NOTE — Progress Notes (Signed)
Occupational Therapy Session Note  Patient Details  Name: Brian Rios MRN: [id number] Date of Birth: [birth year]  Today's Date: 04/03/2023 OT Individual Time: 1300-1355 OT Individual Time Calculation (min): 55 min    Short Term Goals: Week 1:  OT Short Term Goal 1 (Week 1): LTG=STG 2/2 ELOS  Skilled Therapeutic Interventions/Progress Updates:   Pt seen for skilled OT with focus on dynamic standing for sink side grooming skills prior to leaving the room for NMRE R sided. Pt integrating R hand into face washing and hand washing but limited grasp and isolated finger use. Amb without AD to and from day room with close S. Strong focus on WB through R UE prior to tx and use. [employer] applied 5 min to wrist extensors and 5 min to wrist and finger flexors to strengthen grasp and elicit pinch. Removed and addressed PNF diagonals with some resistance given for scap through forearm. Pt completed 3 sheets of Velcro card grasp and removal activity table top level with placement in a pile across midline. 65 % accuracy and cues to decrease R sh compensatory movement for proximal weakness. As session progressed with some light high interest music, pt became more animated with increased eye contact and attempts at conversation despite aphasia and novel clinician. Left pt bed level with exit alarm set, needs and nurse call button in reach.    Pain: denies all pain   Therapy Documentation Precautions:  Precautions Precautions: Fall Restrictions Weight Bearing Restrictions: No   Therapy/Group: Individual Therapy  Vicenta Dunning 04/03/2023, 7:44 AM

## 2023-04-03 NOTE — Progress Notes (Signed)
Physical Therapy Session Note  Patient Details  Name: Brian Rios MRN: [id number] Date of Birth: [birth year]  Today's Date: 04/03/2023 PT Individual Time: 1100-1125 PT Individual Time Calculation (min): 25 min   Short Term Goals: Week 1:  PT Short Term Goal 1 (Week 1): STGs=LTGs secondary to ELOS  Skilled Therapeutic Interventions/Progress Updates:   Received pt sitting on EOB, pt agreeable to PT treatment, and denied any pain during session. Session with emphasis on functional mobility/transfers, generalized strengthening and endurance, NMR, and ambulation. Pt performed all transfers without AD and supervision throughout session. Pt ambulated >351ft without AD and close supervision to ortho gym to work on [employer], but out of service. Ambulated 181ft without AD and supervision to main therapy gym and worked on dynamic standing balance and functional use of RUE while standing on [employer] recreating picture on pegboard with close supervision for balance. Pt tasked with using RUE to pick up pegs and transferring to LUE to place in hole - pt required min cues overall. Pt removed 4 pegs with RUE then ambulated 158ft without AD and supervision back to room. Concluded session with pt sitting EOB, needs within reach, and bed alarm on.  Therapy Documentation Precautions:  Precautions Precautions: Fall Restrictions Weight Bearing Restrictions: No  Therapy/Group: Individual Therapy Marlana Salvage Zaunegger Blima Rich PT, DPT 04/03/2023, 7:21 AM

## 2023-04-04 ENCOUNTER — Other Ambulatory Visit: Payer: Self-pay

## 2023-04-04 NOTE — Progress Notes (Signed)
Speech Language Pathology Daily Session Note  Patient Details  Name: Brian Rios MRN: [id number] Date of Birth: 27-May-1981  Today's Date: 04/04/2023 SLP Individual Time: 4098-1191 SLP Individual Time Calculation (min): 45 min  Short Term Goals: Week 1: SLP Short Term Goal 1 (Week 1): STGs=LTGs due to ELOS  Skilled Therapeutic Interventions:   SLP facilitated a variety of tasks targeting verbal expression. Pt benefited from minA to verbalize 2-3 word responses during initial (functional) conversation re current mood and recent events. SLP assisted pt to the laundry room to check on his laundry. He INDly moved his clothes from the washer to the dryer and required only spv cues to ID the correct settings for drying time. Script training also completed. Similar to prev tx session, he benefited from additional time and minA for phrase-sentence length responses. New script was introduced, including fo 2-3 choices for responses. He again required additional time, though cueing increased to modA given increased complexity of responses. He required modA to name moderately complex items around the room(5), but was very responsive to phonemic cues and lead ins from SLP. SLP also introduced simple object description task (1 characteristic) via errorless learning and provided education re compensatory communication methods such as gestures and simple circumlocution to assist w/ improved communication of wants/needs with staff and in the home environment. Pt left at bedside (EOB) with bed alarm on and call light/belongings within reach. Recommend cont ST per POC.   Pain Pain Assessment Pain Scale: 0-10 Pain Score: 0-No pain  Therapy/Group: Individual Therapy  Pati Gallo, MS, CCC-SLP 04/04/2023, 2:56 PM

## 2023-04-04 NOTE — Progress Notes (Signed)
Patient ID: Brian Rios, male   DOB: 1981/08/17, 42 y.o.   MRN: [id number]  1404-SW left message for [name] to provide updates from team conference, and requested return phone call.   Cecile Sheerer, MSW, LCSWA Office: [phone] Cell: [phone] Fax: [phone]

## 2023-04-04 NOTE — Progress Notes (Signed)
Physical Therapy Weekly Progress Note  Patient Details  Name: Brian Rios MRN: [id number] Date of Birth: 03/08/1981  Beginning of progress report period: March 28, 2023 End of progress report period: April 04, 2023  Today's Date: 04/04/2023 PT Individual Time: 1100-1200 PT Individual Time Calculation (min): 60 min   No new STGs were established for this patient secondary to ELOS and CLOF- Patient continues to require CGA/SBA for functional mobility tasks without the use of an AD. Patient at times demonstrates minor LOBs due to poor awareness with decreased foot clearance limiting his independence with functional mobility. Patient needs to be as independent as possible in order for a safe discharge home secondary to limited family support. Focus of treatment session with be to improve overall dynamic stability for improved functional mobility and independence for a safe discharge home.   Patient continues to demonstrate the following deficits muscle weakness, decreased cardiorespiratoy endurance, decreased coordination and decreased motor planning, decreased visual perceptual skills, decreased attention, decreased awareness, decreased problem solving, decreased safety awareness, and delayed processing, and decreased standing balance, hemiplegia, and decreased balance strategies and therefore will continue to benefit from skilled PT intervention to increase functional independence with mobility.  Patient progressing toward long term goals..  Continue plan of care.  PT Short Term Goals Week 2:  PT Short Term Goal 1 (Week 2): STGs=LTGs secondary to ELOS  Skilled Therapeutic Interventions/Progress Updates:  Patient greeted supine in bed and agreeable to PT treatment session. Patient stood from EOB without the use of an AD and distant supv. Patient had laundry in the dryer so he gait trained to/from his room and laundry room, grabbed a basket, placed all of his clothes in the basket and then  brought them to the gym where he remained standing on an airex foam pad while folding his laundry with SBA from therapist for safety. Patient required total assistance for folding shirts, however was able to fold underwear and pants with increased time. Patient then brought all of his clothes to the room where he sat EOB in order to change his clothes with distant supv. Patient then placed all of his clean clothes into his backpack. Patient gait trained to/from his room and main rehab gym without the use of an AD and SBA for safety- VC for improved Rt foot clearance, however patient continues to demonstrate poor awareness and catches his Rt toes leading to instability. Patient was able to appropriately utilize his stepping strategy and no physical assistance from therapist for steadying.   Patient performed various dynamic standing activities in order to improve balance and independence with functional mobility. All activities performed with CGA/MInA for improved stability and no UE support- -Stepping onto/off of bosu ball, x5 with each LE leading -Stepping laterally onto/off of bosu ball, x5 with each LE leading  -Stepping onto and across bosu ball, x5 in each direction -Static standing balance on bosu ball, 3 x 30 second trials   At end of treatment session patient tasked with recalling various things he did throughout treatment session and then verbalizing to therapist- Min/Mod cues for recall and verbalization.   Patient left sitting EOB with bed alarm on, call bell within reach and all needs met.    Therapy Documentation Precautions:  Precautions Precautions: Fall Restrictions Weight Bearing Restrictions: No  Pain: No/Denies pain.    Therapy/Group: Individual Therapy  Emma-Lee Oddo 04/04/2023, 7:49 AM

## 2023-04-04 NOTE — Progress Notes (Signed)
Occupational Therapy Session Note  Patient Details  Name: Brian Rios MRN: [id number] Date of Birth: Mar 20, 1981  Today's Date: 04/04/2023 OT Individual Time: 0700-0813 OT Individual Time Calculation (min): 73 min    Short Term Goals: Week 2:  OT Short Term Goal 1 (Week 2): STG=LTG 2/2 ELOS  Skilled Therapeutic Interventions/Progress Updates:    Pt sitting in bed upon arrival and ready for breakfast. Initial focus on self feeding and swallowing strategies. Pt requires min verbal cues for portion mgmt. Pt initiates use of RUE as gross assist. Bathing at shower level with close supervision and min verbal cues for safety awareness. Dressing with CGA for standing balance. Pt amb to laundry room and placed clothing in washer. Min verbal cues to use washer. Amb to gym for ball toss with BUE. Saebo placed on RUE wrist/finger flexors at end of session.  Saebo Stim One Unattended 330 pulse width 35 Hz pulse rate On 8 sec/ off 8 sec Ramp up/ down 2 sec Symmetrical Biphasic wave form  Max intensity at 500 Ohm load  OTA returned upon completion and removed Saebo. No adverse reactions noted.    Therapy Documentation Precautions:  Precautions Precautions: Fall Restrictions Weight Bearing Restrictions: No Pain:  Pt denies pain this morning   Therapy/Group: Individual Therapy  Rich Brave 04/04/2023, 8:18 AM

## 2023-04-04 NOTE — Progress Notes (Signed)
PROGRESS NOTE   Subjective/Complaints:  No events overnight. No acute complaints. Difficult to interview due to expressive aphasia.  Denies any ongoing itchiness, discomfort from rash.  ROS:  + Rash - groin -improved + Depression- difficult to assess d/t aphasia - stable No pains, no breathing issues, limited ROS due to aphasia   Objective:   No results found. No results for input(s): "WBC", "HGB", "HCT", "PLT" in the last 72 hours.  No results for input(s): "NA", "K", "CL", "CO2", "GLUCOSE", "BUN", "CREATININE", "CALCIUM" in the last 72 hours.   Intake/Output Summary (Last 24 hours) at 04/04/2023 1307 Last data filed at 04/04/2023 0750 Gross per 24 hour  Intake 1080 ml  Output 0 ml  Net 1080 ml         Physical Exam: Vital Signs Blood pressure 128/78, pulse 100, temperature 97.6 F (36.4 C), temperature source Oral, resp. rate 18, height 5\' 10"  (1.778 m), weight 91.6 kg, SpO2 99 %.  General: No acute distress.  Laying in bed Flat affect , mood are appropriate.   Heart: Regular rate and rhythm no rubs murmurs or extra sounds Lungs: Clear to auscultation, breathing unlabored, no rales or wheezes Abdomen: Positive bowel sounds, soft nontender to palpation, nondistended. Abdominal scar, well healed Extremities: No clubbing, cyanosis, or edema Skin: No evidence of breakdown. + Maculopappular rash along R>L inner groin -improved from last exam  MSK:      No apparent deformity      Strength: All 4 limbs antigravity against resistance.                RUE: 4/5 except wrist and finger ext 3/5                LUE: 5 out of 5 throughout                RLE: 5 out of 5 throughout                LLE:  5 out of 5 throughout MAS 1+ R finger flexors Mild/mod dysarthria Oriented to person place day and month with options; difficulty wordfinding spontaneously, can get 3/3 with extended time, remembers 3/3 with cues but cannot  spontaneously reproduce names      Assessment/Plan: 1. Functional deficits which require 3+ hours per day of interdisciplinary therapy in a comprehensive inpatient rehab setting. Physiatrist is providing close team supervision and 24 hour management of active medical problems listed below. Physiatrist and rehab team continue to assess barriers to discharge/monitor patient progress toward functional and medical goals  Care Tool:  Bathing    Body parts bathed by patient: Right arm, Chest, Abdomen, Front perineal area, Buttocks, Right upper leg, Left upper leg, Face, Right lower leg, Left lower leg   Body parts bathed by helper: Left arm     Bathing assist Assist Level: Minimal Assistance - Patient > 75%     Upper Body Dressing/Undressing Upper body dressing   What is the patient wearing?: Pull over shirt    Upper body assist Assist Level: Supervision/Verbal cueing    Lower Body Dressing/Undressing Lower body dressing      What is the patient wearing?: Pants,  Underwear/pull up     Lower body assist Assist for lower body dressing: Contact Guard/Touching assist     Toileting Toileting    Toileting assist Assist for toileting: Independent     Transfers Chair/bed transfer  Transfers assist     Chair/bed transfer assist level: Minimal Assistance - Patient > 75%     Locomotion Ambulation   Ambulation assist      Assist level: Minimal Assistance - Patient > 75% Assistive device: No Device Max distance: 200+   Walk 10 feet activity   Assist     Assist level: Minimal Assistance - Patient > 75% Assistive device: No Device   Walk 50 feet activity   Assist    Assist level: Minimal Assistance - Patient > 75% Assistive device: No Device    Walk 150 feet activity   Assist    Assist level: Minimal Assistance - Patient > 75% Assistive device: No Device    Walk 10 feet on uneven surface  activity   Assist     Assist level: Minimal  Assistance - Patient > 75% Assistive device: Other (comment) (No AD)   Wheelchair     Assist Is the patient using a wheelchair?: No             Wheelchair 50 feet with 2 turns activity    Assist            Wheelchair 150 feet activity     Assist          Blood pressure 128/78, pulse 100, temperature 97.6 F (36.4 C), temperature source Oral, resp. rate 18, height 5\' 10"  (1.778 m), weight 91.6 kg, SpO2 99 %.  Medical Problem List and Plan: 1. 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             -patient may shower             -ELOS/Goals: 5-7 days - SPV goals PT/OT, Min A SLP goals - 6/19 DC date  - 6/11: Per SW, family not willing to come in for family education, patient must be fully independent d/t mother being only family support and she is disabled. CGA for gait, poor awareness, anticipate SPV for community mobility at discharge. SLP somewhat impulsive with POS, some penetration/aspirations on thin liquids. Full SPV with self feeding due to impulsivity. Verbalizing more consistently, more apraxia than aphasia.    2.  Antithrombotics: -DVT/anticoagulation:  Pharmaceutical: Lovenox             -antiplatelet therapy: Aspirin 81 mg daily and Brilinta 90 mg twice daily  3. Pain Management: Tylenol as needed  4. Mood/Behavior/Sleep: Cogentin 1 mg nightly, Depakote 750 mg nightly, Lexapro 20 mg daily, Remeron 45 milligrams nightly             -hx of schizophrenia on antipsychotic agents: Abilify 30 mg nightly, Seroquel 50 mg 3 times daily as needed - DC PRN  -6/11-12: D/w patient medications, mood; well-controlled, continue current regimen    5. Neuropsych/cognition: This patient is not capable of making decisions on his own behalf.  -Already on a few antidepressants, and cognitive  speed improving.  Attention appropriate per therapy notes.  No change in medications at this time  6. Skin/Wound Care: Routine skin checks   - 6/11: Groin rash - likely yeast. Nystatin powder x5 days.  Improved appearance.  7. Fluids/Electrolytes/Nutrition/dysphagia: Routine in and outs with follow-up chemistries -admission labs appropriate. Diet Orders (From admission, onward)     Start     Ordered   04/02/23 1507  Diet regular Room service appropriate? Yes; Fluid consistency: Thin  Diet effective now       Question Answer Comment  Room service appropriate? Yes   Fluid consistency: Thin      04/02/23 1507           - stop CBG checks 6/6   8.  Hypertension.  Lisinopril 10 mg daily.  Monitor with increased mobility.  Blood pressure well-controlled on current regimen, monitor. -6/8: Mildly hypotensive, DC his lisinopril 6/11-12: Stable; monitor      04/04/2023    1:05 PM 04/04/2023    4:58 AM 04/03/2023    7:36 PM  Vitals with BMI  Systolic 128 94 128  Diastolic 78 64 76  Pulse 100 66 90    9.  History of alcohol tobacco and drug use.  Urine drug screen positive cocaine as well as marijuana.  Continue NicoDerm patch.  Provide counseling   10.  Right lower lobe pneumonia.  Complete course of Unasyn.  Follow-up speech therapy   - 6/11: WBC stable 12-11; vitals stable, no s/s infection   11.  Hyperlipidemia. 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 Diarrhea. LBM 6/5; improving - resolved - LBM 6/10     LOS: 8 days A FACE TO FACE EVALUATION WAS PERFORMED  Angelina Sheriff 04/04/2023, 1:07 PM

## 2023-04-04 NOTE — Progress Notes (Signed)
Occupational Therapy Session Note  Patient Details  Name: Brian Rios MRN: [id number] Date of Birth: February 18, 1981  Today's Date: 04/04/2023 OT Individual Time: 1300-1330 OT Individual Time Calculation (min): 30 min    Short Term Goals: Week 2:  OT Short Term Goal 1 (Week 2): STG=LTG 2/2 ELOS  Skilled Therapeutic Interventions/Progress Updates:    Pt resting in bed upon arrival. OT intervention with focus on higher level balance activities. Pt amb to gym and tossed ball against mini tramp while standing on AirEx. No LOB noted. Pt amb to day room and engaged in HoverBall activitiy-kicking against wall, "dribbling", and kicking back/forth to PT in room. No LOB noted. Saebo applied to RUE wrist/finger extensors at end of session.  Saebo Stim One Unattended 330 pulse width 35 Hz pulse rate On 8 sec/ off 8 sec Ramp up/ down 2 sec Symmetrical Biphasic wave form  Max intensity at 500 Ohm load  Saebo unit removed upon competion. No adverse reactions noted.  Therapy Documentation Precautions:  Precautions Precautions: Fall Restrictions Weight Bearing Restrictions: No Pain:  Pt denies pain this afternoon     Therapy/Group: Individual Therapy  Rich Brave 04/04/2023, 2:52 PM

## 2023-04-04 NOTE — Progress Notes (Signed)
Occupational Therapy Weekly Progress Note  Patient Details  Name: Nasif Bos MRN: [id number] Date of Birth: 22-Jul-1981  Beginning of progress report period: March 28, 2023 End of progress report period: April 04, 2023     Pt is making steady progress with BADLs, functional transfers, and increased safety awareness. Pt completes bathing tasks with min A/CGA at shower level. LB dressing with CGA. Functional transfers/amb without AD with CGA/close supervision. Pt continues to require min verbal cues for safety awareness. Pt initiating use of RUE in all functional tasks.   Patient continues to demonstrate the following deficits: muscle weakness, muscle joint tightness, and muscle paralysis, decreased cardiorespiratoy endurance, impaired timing and sequencing, abnormal tone, unbalanced muscle activation, decreased coordination, and decreased motor planning, decreased visual perceptual skills, decreased attention to right and decreased motor planning, decreased safety awareness, decreased memory, and delayed processing, and decreased standing balance, decreased postural control, hemiplegia, and decreased balance strategies and therefore will continue to benefit from skilled OT intervention to enhance overall performance with BADL, iADL, Vocation, and Reduce care partner burden.  Patient progressing toward long term goals..  Continue plan of care.  OT Short Term Goals Week 1:  OT Short Term Goal 1 (Week 1): LTG=STG 2/2 ELOS OT Short Term Goal 1 - Progress (Week 1): Progressing toward goal Week 2:  OT Short Term Goal 1 (Week 2): STG=LTG 2/2 ELOS   Rich Brave 04/04/2023, 6:48 AM

## 2023-04-05 MED ORDER — ACETAMINOPHEN 325 MG PO TABS: 650.0000 mg | ORAL_TABLET | ORAL | Status: AC | PRN

## 2023-04-05 NOTE — Progress Notes (Signed)
Speech Language Pathology Weekly Progress and Session Note  Patient Details  Name: Brian Rios MRN: [id number] Date of Birth: 05/18/1981  Beginning of progress report period: March 28, 2023 End of progress report period: April 05, 2023  Today's Date: 04/05/2023 SLP Individual Time: 1300-1400 SLP Individual Time Calculation (min): 60 min  Short Term Goals: Week 1: SLP Short Term Goal 1 (Week 1): STGs=LTGs due to ELOS SLP Short Term Goal 1 - Progress (Week 1): Progressing toward goal    New Short Term Goals: Week 2: SLP Short Term Goal 1 (Week 2): STGs = LTGs 2* ELOS  Weekly Progress Updates:  Pt has made good progress towards ST goals. Severity of aphasia/apraxia negatively impacts SLP's ability to truly judge complex cognition, though functional cognition (memory, problem solving, awareness) appears [facility] for supervised home environment at this time. He has met his long term dysphagia goal at this time as well, as he currently tolerates regular textures and thin liquids @ modI. Verbal expression continues to improve at word level, though remains modA overall at this time 2* recent introduction of tasks of slightly increased complexity. He would benefit from continued ST services to further target word level responses of increased complexity as well as phrase-sentence length responses and facilitate improved communication of wants/needs and increased overall independence.    Intensity: Minumum of 1-2 x/day, 30 to 90 minutes Frequency: 1 to 3 out of 7 days Duration/Length of Stay: 04/10/23 Treatment/Interventions: Cognitive remediation/compensation;Cueing hierarchy;Dysphagia/aspiration precaution training;Environmental controls;Functional tasks;Internal/external aids;Patient/family education;Therapeutic Activities;Speech/Language facilitation   Daily Session  Skilled Therapeutic Interventions: SLP facilitated a variety of tx tasks targeting expressive language. SLP and pt discussed  removing total supervision during meals 2* continued pt success w/ regular textures after recent diet upgrade. Pt's nurse tech also reported that pt tolerated his noon meal well and only required one cue to maintain slow rate. Recommend removal of total supervision at this time. SLP did review current aspiration precautions/safe swallow strategies of slow rate, small bites/sips, and alternating liquids/solids. SLP introduced naming tasks of slightly increased complexity: responsive naming and simple generative naming (1 item). Pt required modA cues for generative naming. Initially benefited from modA verbal lead ins/phonemic cues during responsive naming task, though success improved to minA as task progressed. Spontaneous sentences x3 noted and pt repeated SLP's prompts before answering w/ ~65% accuracy. Pt provided w/ additional responsive naming and sentence completion tasks to complete prior to next ST tx session. Pt left EOB w/ bed alarm set. Belongings and call light within reach. Nursing notified of removal of full supervision during meals. Recommend cont ST per POC.     Pain  No pain reported   Therapy/Group: Individual Therapy  Pati Gallo, MS, CCC-SLP 04/05/2023, 4:14 PM

## 2023-04-05 NOTE — Discharge Summary (Signed)
Physician Discharge Summary  Patient ID: Brian Rios MRN: [id number] DOB/AGE: 1981-07-20 42 y.o.  Admit date: 03/27/2023 Discharge date: 04/10/2023  Discharge Diagnoses:  Principal Problem:   Left middle cerebral artery stroke [facility]) Active Problems:   Recurrent major depressive disorder, in partial remission (HCC)   Chronic schizophrenia (HCC) DVT prophylaxis Hypertension History of alcohol tobacco and drug use Right lower lobe pneumonia Hyperlipidemia   Discharged Condition: Stable  Significant Diagnostic Studies: DG Swallowing Func-Speech Pathology  Result Date: 03/24/2023 Table formatting from the original result was not included. Modified Barium Swallow Study Patient Details Name: Brian Rios MRN: [id number] Date of Birth: 20-Jul-1981 Today's Date: 03/24/2023 HPI/PMH: HPI: 42 year old male presenting with new onset of right sided weakness, right facial droop and dysphasia secondary to occlusion of two proximal left M2 branches. Patchy acute left MCA infarcts superimposed on chronic infarcts. Clinical Impression: Clinical Impression: Pt presents with a mild oropharyngeal dysphagia c/b premature spillage, delayed swallow initiation, incomplete laryngeal closure, and diminished sensation.  These deficits resulted in mild, silent aspiration of thin liquid prior to the swallow x1 with serial straw sips of thin liquid.  Pt was unable to generate a cued cough to attempt to clear.  Additional trials of thin liquid by straw sips resulted in trace, transient penetration of thin liquid with serial straws.  There was no penetration or aspiration of any other consistencies.  Pt exhibted good oral clearance of solids after slightly prolonged oral phase.  With pill simulation, there was brief oral stasis of tablet on posterior lingual surface which cleared with liquid wash.  There was no increased penetration of thin liquid during pill simulation.  On esophageal sweep there was retention of  contrast in the esophagus with backflow noted, especially with belching.  Recommend regular texture diet with thin liquids by cup.  Recommend close supervision with POs 2/2 impulsivity. Factors that may increase risk of adverse event in presence of aspiration Rubye Oaks & Clearance Coots 2021): No data recorded Recommendations/Plan: Swallowing Evaluation Recommendations Swallowing Evaluation Recommendations Recommendations: PO diet PO Diet Recommendation: Regular; Thin liquids (Level 0) Liquid Administration via: Cup; No straw Medication Administration: Whole meds with liquid Supervision: Full supervision/cueing for swallowing strategies Swallowing strategies: Slow rate; Small bites/sips; Single bites; Single sips Postural changes: Position pt fully upright for meals Oral care recommendations: Oral care BID (2x/day) Treatment Plan Treatment Plan Treatment recommendations: Therapy as outlined in treatment plan below Follow-up recommendations: Acute inpatient rehab (3 hours/day) Functional status assessment: Patient has had a recent decline in their functional status and demonstrates the ability to make significant improvements in function in a reasonable and predictable amount of time. Treatment frequency: Min 2x/week Treatment duration: 2 weeks Interventions: Aspiration precaution training; Diet toleration management by SLP Recommendations Recommendations for follow up therapy are one component of a multi-disciplinary discharge planning process, led by the attending physician.  Recommendations may be updated based on patient status, additional functional criteria and insurance authorization. Assessment: Orofacial Exam: Orofacial Exam Oral Cavity: Oral Hygiene: WFL Oral Cavity - Dentition: Adequate natural dentition Orofacial Anatomy: WFL Oral Motor/Sensory Function: -- (See BSE) Anatomy: No data recorded Boluses Administered: Boluses Administered Boluses Administered: Thin liquids (Level 0); Mildly thick liquids (Level 2,  nectar thick); Moderately thick liquids (Level 3, honey thick); Puree; Solid  Oral Impairment Domain: Oral Impairment Domain Lip Closure: Escape beyond mid-chin (R) Tongue control during bolus hold: Posterior escape of less than half of bolus Bolus preparation/mastication: Timely and efficient chewing and mashing Bolus transport/lingual motion: Brisk tongue motion  Oral residue: Complete oral clearance Location of oral residue : N/A Initiation of pharyngeal swallow : Pyriform sinuses  Pharyngeal Impairment Domain: Pharyngeal Impairment Domain Soft palate elevation: No bolus between soft palate (SP)/pharyngeal wall (PW) Laryngeal elevation: Complete superior movement of thyroid cartilage with complete approximation of arytenoids to epiglottic petiole Anterior hyoid excursion: Partial anterior movement Epiglottic movement: Complete inversion Laryngeal vestibule closure: Incomplete, narrow column air/contrast in laryngeal vestibule Pharyngeal stripping wave : Present - complete Pharyngeal contraction (A/P view only): N/A Pharyngoesophageal segment opening: Complete distension and complete duration, no obstruction of flow Tongue base retraction: Trace column of contrast or air between tongue base and PPW Pharyngeal residue: Trace residue within or on pharyngeal structures Location of pharyngeal residue: Pyriform sinuses; Valleculae  Esophageal Impairment Domain: Esophageal Impairment Domain Esophageal clearance upright position: Esophageal retention with retrograde flow below pharyngoesophageal segment (PES) Pill: Esophageal Impairment Domain Esophageal clearance upright position: Esophageal retention with retrograde flow below pharyngoesophageal segment (PES) Penetration/Aspiration Scale Score: Penetration/Aspiration Scale Score 1.  Material does not enter airway: Mildly thick liquids (Level 2, nectar thick); Thin liquids (Level 0); Moderately thick liquids (Level 3, honey thick); Puree; Solid; Pill 2.  Material enters  airway, remains ABOVE vocal cords then ejected out: Thin liquids (Level 0) 8.  Material enters airway, passes BELOW cords without attempt by patient to eject out (silent aspiration) : Thin liquids (Level 0) Compensatory Strategies: Compensatory Strategies Compensatory strategies: Yes Other(comment): Ineffective (Cued cough)   General Information: No data recorded No data recorded  No data recorded  No data recorded  No data recorded  No data recorded Behavior/Cognition: Alert; Cooperative No data recorded No data recorded No data recorded Volitional Swallow: Able to elicit Exam Limitations: No limitations Goal Planning: Prognosis for improved oropharyngeal function: Good No data recorded No data recorded No data recorded Consulted and agree with results and recommendations: Patient; Nurse Pain: Pain Assessment Pain Assessment: Faces Faces Pain Scale: 0 Facial Expression: 0 Body Movements: 0 Muscle Tension: 0 Compliance with ventilator (intubated pts.): N/A Vocalization (extubated pts.): 0 CPOT Total: 0 Pain Location: shaking head no when asked about pain Pain Intervention(s): Limited activity within patient's tolerance; Monitored during session; Repositioned End of Session: Start Time:SLP Start Time (ACUTE ONLY): 1400 Stop Time: SLP Stop Time (ACUTE ONLY): 1415 Time Calculation:SLP Time Calculation (min) (ACUTE ONLY): 15 min Charges: SLP Evaluations $ SLP Speech Visit: 1 Visit SLP Evaluations $BSS Swallow: 1 Procedure $ SLP EVAL LANGUAGE/SOUND PRODUCTION: 1 Procedure $Swallowing Treatment: 1 Procedure $Speech Treatment for Individual: 1 Procedure SLP visit diagnosis: SLP Visit Diagnosis: Dysphagia, oropharyngeal phase (R13.12) Past Medical History: Past Medical History: Diagnosis Date  Alcohol abuse   Depression   Drug abuse (HCC)   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  Erectile dysfunction   Heart murmur   Hyperlipidemia   Schizophrenia (HCC)   Tobacco abuse  Past Surgical History:  Past Surgical History: Procedure Laterality Date  BOWEL RESECTION N/A 09/04/2022  Procedure: SMALL BOWEL RESECTION, open;  Surgeon: Leafy Ro, MD;  Location: [facility] ORS;  Service: General;  Laterality: N/A;  IR CT HEAD LTD  03/21/2023  IR CT HEAD LTD  03/21/2023  IR PERCUTANEOUS ART THROMBECTOMY/INFUSION INTRACRANIAL INC DIAG ANGIO  03/21/2023  ORIF ORBITAL FRACTURE  1985  PARTIAL COLECTOMY Right 09/04/2022  Procedure: PARTIAL COLECTOMY;  Surgeon: Leafy Ro, MD;  Location: [facility] ORS;  Service: General;  Laterality: Right;  Right colectimy  RADIOLOGY WITH ANESTHESIA N/A 03/21/2023  Procedure: RADIOLOGY WITH ANESTHESIA;  Surgeon: Radiologist, Medication, MD;  Location: [facility] OR;  Service: Radiology;  Laterality: N/A;  TEE WITHOUT CARDIOVERSION N/A 07/19/2020  Procedure: TRANSESOPHAGEAL ECHOCARDIOGRAM (TEE);  Surgeon: Lamar Blinks, MD;  Location: [facility] ORS;  Service: Cardiovascular;  Laterality: N/A;  WRIST SURGERY Left 11/07/2013  REPAIR ULNAR ARTERY, NERVE AND TENDON Kerrie Pleasure, MA, CCC-SLP Acute Rehabilitation Services Office: [phone] 03/24/2023, 12:12 PM  IR PERCUTANEOUS ART THROMBECTOMY/INFUSION INTRACRANIAL INC DIAG ANGIO  Result Date: 03/23/2023 INDICATION: New onset of expressive aphasia and right-sided weakness and facial droop. 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 EXAM: 1. EMERGENT LARGE VESSEL OCCLUSION THROMBOLYSIS (anterior CIRCULATION) COMPARISON:  CT angiogram Mar 21, 2023, and MRI of the brain Mar 21, 2023. MEDICATIONS: Ancef 2 g IV antibiotic was administered within 1 hour of the procedure. ANESTHESIA/SEDATION: General anesthesia. CONTRAST:  Omnipaque 300 approximately 125 mL. FLUOROSCOPY TIME:  Fluoroscopy Time: 23 minutes 448 seconds (1438 mGy). COMPLICATIONS: None immediate. TECHNIQUE: Following a full explanation of the procedure along with the potential  associated complications, an informed witnessed consent was obtained. The risks of intracranial hemorrhage of 10%, worsening neurological deficit, ventilator dependency, death and inability to revascularize were all reviewed in detail with the patient's aunt. The patient was then put under general anesthesia by the Department of Anesthesiology at [facility]. The right groin was prepped and draped in the usual sterile fashion. Thereafter using modified Seldinger technique, transfemoral access into the right common femoral artery was obtained without difficulty. Over an 0.035 inch guidewire an 8 French 15 cm sheath was inserted. Through this, and also over an 0.035 inch guidewire a combination of a Berenstein 125 cm support catheter inside of a 95 cm 087 balloon guide was advanced to the aortic arch region and selectively positioned in the left common carotid artery initially and then in the distal left internal carotid artery. FINDINGS: The left common carotid arteriogram demonstrates the left external carotid artery and its major branches to be widely patent. The left internal carotid artery at the bulb to the cranial skull base is widely patent. The petrous, the cavernous and the supraclinoid ICA are patent. The left anterior cerebral artery opacifies into the capillary and venous phases. Prompt cross-filling via the anterior communicating artery of the right anterior cerebral artery A2 segment and distally is noted. The left middle cerebral artery proximally demonstrates a moderate stenosis. The inferior division is widely patent. The superior division at its origin and extending into the distal M3 region demonstrates an area of irregularity associated with a significant stenosis. There is also a small stump like configuration arising from the more proximal portion of the superior division projecting posteriorly. PROCEDURE: Through the balloon guide catheter now in the distal left ICA, a combination of a  045 Zoom aspiration catheter with a 160 cm 021 microcatheter was advanced using a standard 014 micro guidewire with a moderate J configuration to the distal M1 segment. The microcatheter was then advanced without difficulty into the superior division distal M3 segment followed by the microcatheter. The guidewire was removed. Brisk aspiration was obtained from the hub of the microcatheter which was then connected to continuous heparinized saline infusion. A 3 mm x 40 mm Solitaire X retrieval device was then advanced to the distal end of the microcatheter and deployed such that the proximal portion of the device was just proximal to the origin of the inferior  division. The 045 Zoom aspiration catheter was then advanced to engage the proximal portion of the device. Contact aspiration was then applied at the hub of the Zoom aspiration catheter and at the hub of the balloon guide catheter for approximately a minute and a half. Thereafter, the combination of the retrieval device, the micro guidewire and the microcatheter were retrieved and removed. A control arteriogram performed through the balloon guide catheter demonstrated patency of the inferior division. There was progressive narrowing with the previously noted filling defects at the proximal aspect of the superior division. Four aliquots of 25 mcg of nitroglycerin, were then infused intra-arterially with a modest improvement in the caliber of the proximal superior division with minimal relief. With modest improvement in the caliber of the superior division just distal to its origin, a faint patency was evident of a side branch arising from the proximal superior division projecting posteriorly. This demonstrated a filling defect within it with slow antegrade flow distal to this. The combination of the 045 Zoom aspiration catheter with a 021 microcatheter was then advanced to the distal left M1 segment over an 014 inch micro guidewire with a moderate J configuration.  Using a torque device access into the partially recanalized branch was obtained with the micro guidewire followed by the microcatheter. The guidewire was removed. Good aspiration obtained from the hub of the microcatheter. A control arteriogram performed through the microcatheter demonstrated prominent vessel distal to this. This was then connected to continuous heparinized saline infusion. A 4 mm x 40 mm Solitaire X retrieval device was then advanced to the distal end of the microcatheter. This was deployed in the usual manner such that the proximal portion of the device was just proximal to the origin of the superior division. Constant aspiration was then applied through the Zoom aspiration catheter which has been advanced to the origin of the superior division for a minute and a half. Thereafter, with proximal flow arrest, and constant aspiration applied at the hub of the Zoom aspiration catheter and the balloon guide catheter, the combination was retrieved and removed. A control arteriogram performed through the balloon guide catheter demonstrated complete occlusion of the superior division with the stent at its origin. 2 aliquots of nitroglycerin and 2.5 mg of verapamil were then infused intra-arterially. A third pass was now made using a Vecta 46 aspiration catheter which was advanced with the catheter over an 014 inch soft tip Aristotle micro guidewire. The micro guidewire was advanced through the occluded superior division into the M2 M3 junction followed by the microcatheter. The guidewire was removed. Good aspiration obtained from the hub of the microcatheter. The Philipp Ovens was then advanced to engage just distal to the origin of the superior division. The microcatheter and micro guidewire were removed. Following aspiration for a minute and a half, the Vecta catheter was removed. A control arteriogram performed through the balloon guide catheter demonstrated revascularization of the superior division including  the first branch projecting posteriorly. An irregular filling defect was evident at the origin of the initial branch and distal to this. A TICI 3 revascularization was achieved. A control arteriogram performed 2-3 minutes later demonstrated complete reocclusion of the superior division just distal to the first branch from it. A fourth pass was made again with a Vecta 46 aspiration catheter again advanced over the micro guidewire and a microcatheter as described above the distal end of the left M1 segment. Access through the occluded superior division was obtained with the micro guidewire followed by the  microcatheter followed by advancement of the Vecta 46 aspiration catheter just distal to the origin of the superior division. Aspiration was then performed at the hub of the Vecta 46 aspiration catheter for approximately a minute and a half after removal of the microcatheter and the micro guidewire. A control arteriogram performed through the balloon guide catheter now demonstrated a complete revascularization of the superior division with the exception of the first branch from the proximal portion of the superior division achieving a TICI 2C revascularization. Worsening spasm in the left M1 segment was also noted. This responded to 25 mcg of nitroglycerin intra-arterially. It was decided to stop at this juncture. A final control arteriogram performed through the balloon guide catheter in the left common carotid artery demonstrate patency of the internal carotid artery extra cranially and intracranially with a stable TICI 2C revascularization of the left MCA distribution. An 8 French Angio-Seal closure device was then deployed at the groin puncture site for hemostasis. Distal pulses remained stable unchanged from prior to the procedure. A flat panel CT of the brain demonstrated no evidence of intracranial hemorrhage or mass effect. Patient was extubated. Pupils were a 5-6 mm equals left sluggishly reactive. Patient  moved his left side spontaneously but followed no commands or showed movement in the right arm and leg. He was the transferred to the MICU for post revascularization care. Medications utilized aspirin 81 mg and Brilinta 90 mg via an orogastric tube early in anticipation of placement of a stent. IMPRESSION: Status post revascularization of occluded superior division of the left middle cerebral artery mid M2 segment, with 1 pass with a 3 mm x 20 mm Solitaire X retrieval device and contact aspiration, and 1 pass with a 4 mm x 40 mm Solitaire X retrieval device and contact aspiration, and 2 passes with the direct contact aspiration achieving a TICI 2C revascularization. PLAN: As per referring MD. Electronically Signed   By: Julieanne Cotton M.D.   On: 03/23/2023 11:06   IR CT Head Ltd  Result Date: 03/23/2023 INDICATION: New onset of expressive aphasia and right-sided weakness and facial droop. 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 EXAM: 1. EMERGENT LARGE VESSEL OCCLUSION THROMBOLYSIS (anterior CIRCULATION) COMPARISON:  CT angiogram Mar 21, 2023, and MRI of the brain Mar 21, 2023. MEDICATIONS: Ancef 2 g IV antibiotic was administered within 1 hour of the procedure. ANESTHESIA/SEDATION: General anesthesia. CONTRAST:  Omnipaque 300 approximately 125 mL. FLUOROSCOPY TIME:  Fluoroscopy Time: 23 minutes 448 seconds (1438 mGy). COMPLICATIONS: None immediate. TECHNIQUE: Following a full explanation of the procedure along with the potential associated complications, an informed witnessed consent was obtained. The risks of intracranial hemorrhage of 10%, worsening neurological deficit, ventilator dependency, death and inability to revascularize were all reviewed in detail with the patient's aunt. The patient was then put under general anesthesia by the Department of Anesthesiology at [facility]. The right groin was prepped and draped in the usual sterile fashion. Thereafter using modified Seldinger technique, transfemoral access into the right common femoral artery was obtained without difficulty. Over an 0.035 inch guidewire an 8 French 15 cm sheath was inserted. Through this, and also over an 0.035 inch guidewire a combination of a Berenstein 125 cm support catheter inside of a 95 cm 087 balloon guide was advanced to the aortic arch region and selectively positioned in the left common carotid artery initially  and then in the distal left internal carotid artery. FINDINGS: The left common carotid arteriogram demonstrates the left external carotid artery and its major branches to be widely patent. The left internal carotid artery at the bulb to the cranial skull base is widely patent. The petrous, the cavernous and the supraclinoid ICA are patent. The left anterior cerebral artery opacifies into the capillary and venous phases. Prompt cross-filling via the anterior communicating artery of the right anterior cerebral artery A2 segment and distally is noted. The left middle cerebral artery proximally demonstrates a moderate stenosis. The inferior division is widely patent. The superior division at its origin and extending into the distal M3 region demonstrates an area of irregularity associated with a significant stenosis. There is also a small stump like configuration arising from the more proximal portion of the superior division projecting posteriorly. PROCEDURE: Through the balloon guide catheter now in the distal left ICA, a combination of a 045 Zoom aspiration catheter with a 160 cm 021 microcatheter was advanced using a standard 014 micro guidewire with a moderate J configuration to the distal M1 segment. The microcatheter was then advanced without difficulty into the superior division distal M3 segment followed by the microcatheter. The guidewire was removed. Brisk aspiration was  obtained from the hub of the microcatheter which was then connected to continuous heparinized saline infusion. A 3 mm x 40 mm Solitaire X retrieval device was then advanced to the distal end of the microcatheter and deployed such that the proximal portion of the device was just proximal to the origin of the inferior division. The 045 Zoom aspiration catheter was then advanced to engage the proximal portion of the device. Contact aspiration was then applied at the hub of the Zoom aspiration catheter and at the hub of the balloon guide catheter for approximately a minute and a half. Thereafter, the combination of the retrieval device, the micro guidewire and the microcatheter were retrieved and removed. A control arteriogram performed through the balloon guide catheter demonstrated patency of the inferior division. There was progressive narrowing with the previously noted filling defects at the proximal aspect of the superior division. Four aliquots of 25 mcg of nitroglycerin, were then infused intra-arterially with a modest improvement in the caliber of the proximal superior division with minimal relief. With modest improvement in the caliber of the superior division just distal to its origin, a faint patency was evident of a side branch arising from the proximal superior division projecting posteriorly. This demonstrated a filling defect within it with slow antegrade flow distal to this. The combination of the 045 Zoom aspiration catheter with a 021 microcatheter was then advanced to the distal left M1 segment over an 014 inch micro guidewire with a moderate J configuration. Using a torque device access into the partially recanalized branch was obtained with the micro guidewire followed by the microcatheter. The guidewire was removed. Good aspiration obtained from the hub of the microcatheter. A control arteriogram performed through the microcatheter demonstrated prominent vessel distal to this. This was then  connected to continuous heparinized saline infusion. A 4 mm x 40 mm Solitaire X retrieval device was then advanced to the distal end of the microcatheter. This was deployed in the usual manner such that the proximal portion of the device was just proximal to the origin of the superior division. Constant aspiration was then applied through the Zoom aspiration catheter which has been advanced to the origin of the superior division for a minute and a half. Thereafter, with  proximal flow arrest, and constant aspiration applied at the hub of the Zoom aspiration catheter and the balloon guide catheter, the combination was retrieved and removed. A control arteriogram performed through the balloon guide catheter demonstrated complete occlusion of the superior division with the stent at its origin. 2 aliquots of nitroglycerin and 2.5 mg of verapamil were then infused intra-arterially. A third pass was now made using a Vecta 46 aspiration catheter which was advanced with the catheter over an 014 inch soft tip Aristotle micro guidewire. The micro guidewire was advanced through the occluded superior division into the M2 M3 junction followed by the microcatheter. The guidewire was removed. Good aspiration obtained from the hub of the microcatheter. The Philipp Ovens was then advanced to engage just distal to the origin of the superior division. The microcatheter and micro guidewire were removed. Following aspiration for a minute and a half, the Vecta catheter was removed. A control arteriogram performed through the balloon guide catheter demonstrated revascularization of the superior division including the first branch projecting posteriorly. An irregular filling defect was evident at the origin of the initial branch and distal to this. A TICI 3 revascularization was achieved. A control arteriogram performed 2-3 minutes later demonstrated complete reocclusion of the superior division just distal to the first branch from it. A fourth  pass was made again with a Vecta 46 aspiration catheter again advanced over the micro guidewire and a microcatheter as described above the distal end of the left M1 segment. Access through the occluded superior division was obtained with the micro guidewire followed by the microcatheter followed by advancement of the Vecta 46 aspiration catheter just distal to the origin of the superior division. Aspiration was then performed at the hub of the Vecta 46 aspiration catheter for approximately a minute and a half after removal of the microcatheter and the micro guidewire. A control arteriogram performed through the balloon guide catheter now demonstrated a complete revascularization of the superior division with the exception of the first branch from the proximal portion of the superior division achieving a TICI 2C revascularization. Worsening spasm in the left M1 segment was also noted. This responded to 25 mcg of nitroglycerin intra-arterially. It was decided to stop at this juncture. A final control arteriogram performed through the balloon guide catheter in the left common carotid artery demonstrate patency of the internal carotid artery extra cranially and intracranially with a stable TICI 2C revascularization of the left MCA distribution. An 8 French Angio-Seal closure device was then deployed at the groin puncture site for hemostasis. Distal pulses remained stable unchanged from prior to the procedure. A flat panel CT of the brain demonstrated no evidence of intracranial hemorrhage or mass effect. Patient was extubated. Pupils were a 5-6 mm equals left sluggishly reactive. Patient moved his left side spontaneously but followed no commands or showed movement in the right arm and leg. He was the transferred to the MICU for post revascularization care. Medications utilized aspirin 81 mg and Brilinta 90 mg via an orogastric tube early in anticipation of placement of a stent. IMPRESSION: Status post revascularization  of occluded superior division of the left middle cerebral artery mid M2 segment, with 1 pass with a 3 mm x 20 mm Solitaire X retrieval device and contact aspiration, and 1 pass with a 4 mm x 40 mm Solitaire X retrieval device and contact aspiration, and 2 passes with the direct contact aspiration achieving a TICI 2C revascularization. PLAN: As per referring MD. Electronically Signed   By:  Julieanne Cotton M.D.   On: 03/23/2023 11:06   IR CT Head Ltd  Result Date: 03/23/2023 INDICATION: New onset of expressive aphasia and right-sided weakness and facial droop. 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 EXAM: 1. EMERGENT LARGE VESSEL OCCLUSION THROMBOLYSIS (anterior CIRCULATION) COMPARISON:  CT angiogram Mar 21, 2023, and MRI of the brain Mar 21, 2023. MEDICATIONS: Ancef 2 g IV antibiotic was administered within 1 hour of the procedure. ANESTHESIA/SEDATION: General anesthesia. CONTRAST:  Omnipaque 300 approximately 125 mL. FLUOROSCOPY TIME:  Fluoroscopy Time: 23 minutes 448 seconds (1438 mGy). COMPLICATIONS: None immediate. TECHNIQUE: Following a full explanation of the procedure along with the potential associated complications, an informed witnessed consent was obtained. The risks of intracranial hemorrhage of 10%, worsening neurological deficit, ventilator dependency, death and inability to revascularize were all reviewed in detail with the patient's aunt. The patient was then put under general anesthesia by the Department of Anesthesiology at [facility]. The right groin was prepped and draped in the usual sterile fashion. Thereafter using modified Seldinger technique, transfemoral access into the right common femoral artery was obtained without difficulty. Over an 0.035 inch guidewire an 8 French 15 cm sheath was inserted. Through this, and also over an 0.035 inch guidewire  a combination of a Berenstein 125 cm support catheter inside of a 95 cm 087 balloon guide was advanced to the aortic arch region and selectively positioned in the left common carotid artery initially and then in the distal left internal carotid artery. FINDINGS: The left common carotid arteriogram demonstrates the left external carotid artery and its major branches to be widely patent. The left internal carotid artery at the bulb to the cranial skull base is widely patent. The petrous, the cavernous and the supraclinoid ICA are patent. The left anterior cerebral artery opacifies into the capillary and venous phases. Prompt cross-filling via the anterior communicating artery of the right anterior cerebral artery A2 segment and distally is noted. The left middle cerebral artery proximally demonstrates a moderate stenosis. The inferior division is widely patent. The superior division at its origin and extending into the distal M3 region demonstrates an area of irregularity associated with a significant stenosis. There is also a small stump like configuration arising from the more proximal portion of the superior division projecting posteriorly. PROCEDURE: Through the balloon guide catheter now in the distal left ICA, a combination of a 045 Zoom aspiration catheter with a 160 cm 021 microcatheter was advanced using a standard 014 micro guidewire with a moderate J configuration to the distal M1 segment. The microcatheter was then advanced without difficulty into the superior division distal M3 segment followed by the microcatheter. The guidewire was removed. Brisk aspiration was obtained from the hub of the microcatheter which was then connected to continuous heparinized saline infusion. A 3 mm x 40 mm Solitaire X retrieval device was then advanced to the distal end of the microcatheter and deployed such that the proximal portion of the device was just proximal to the origin of the inferior division. The 045 Zoom  aspiration catheter was then advanced to engage the proximal portion of the device. Contact aspiration was then applied at the hub of the Zoom aspiration catheter and at the hub of the balloon guide catheter for approximately a minute and a half. Thereafter, the combination of the retrieval device, the micro guidewire and the microcatheter were  retrieved and removed. A control arteriogram performed through the balloon guide catheter demonstrated patency of the inferior division. There was progressive narrowing with the previously noted filling defects at the proximal aspect of the superior division. Four aliquots of 25 mcg of nitroglycerin, were then infused intra-arterially with a modest improvement in the caliber of the proximal superior division with minimal relief. With modest improvement in the caliber of the superior division just distal to its origin, a faint patency was evident of a side branch arising from the proximal superior division projecting posteriorly. This demonstrated a filling defect within it with slow antegrade flow distal to this. The combination of the 045 Zoom aspiration catheter with a 021 microcatheter was then advanced to the distal left M1 segment over an 014 inch micro guidewire with a moderate J configuration. Using a torque device access into the partially recanalized branch was obtained with the micro guidewire followed by the microcatheter. The guidewire was removed. Good aspiration obtained from the hub of the microcatheter. A control arteriogram performed through the microcatheter demonstrated prominent vessel distal to this. This was then connected to continuous heparinized saline infusion. A 4 mm x 40 mm Solitaire X retrieval device was then advanced to the distal end of the microcatheter. This was deployed in the usual manner such that the proximal portion of the device was just proximal to the origin of the superior division. Constant aspiration was then applied through the  Zoom aspiration catheter which has been advanced to the origin of the superior division for a minute and a half. Thereafter, with proximal flow arrest, and constant aspiration applied at the hub of the Zoom aspiration catheter and the balloon guide catheter, the combination was retrieved and removed. A control arteriogram performed through the balloon guide catheter demonstrated complete occlusion of the superior division with the stent at its origin. 2 aliquots of nitroglycerin and 2.5 mg of verapamil were then infused intra-arterially. A third pass was now made using a Vecta 46 aspiration catheter which was advanced with the catheter over an 014 inch soft tip Aristotle micro guidewire. The micro guidewire was advanced through the occluded superior division into the M2 M3 junction followed by the microcatheter. The guidewire was removed. Good aspiration obtained from the hub of the microcatheter. The Philipp Ovens was then advanced to engage just distal to the origin of the superior division. The microcatheter and micro guidewire were removed. Following aspiration for a minute and a half, the Vecta catheter was removed. A control arteriogram performed through the balloon guide catheter demonstrated revascularization of the superior division including the first branch projecting posteriorly. An irregular filling defect was evident at the origin of the initial branch and distal to this. A TICI 3 revascularization was achieved. A control arteriogram performed 2-3 minutes later demonstrated complete reocclusion of the superior division just distal to the first branch from it. A fourth pass was made again with a Vecta 46 aspiration catheter again advanced over the micro guidewire and a microcatheter as described above the distal end of the left M1 segment. Access through the occluded superior division was obtained with the micro guidewire followed by the microcatheter followed by advancement of the Vecta 46 aspiration catheter  just distal to the origin of the superior division. Aspiration was then performed at the hub of the Vecta 46 aspiration catheter for approximately a minute and a half after removal of the microcatheter and the micro guidewire. A control arteriogram performed through the balloon guide catheter now demonstrated a  complete revascularization of the superior division with the exception of the first branch from the proximal portion of the superior division achieving a TICI 2C revascularization. Worsening spasm in the left M1 segment was also noted. This responded to 25 mcg of nitroglycerin intra-arterially. It was decided to stop at this juncture. A final control arteriogram performed through the balloon guide catheter in the left common carotid artery demonstrate patency of the internal carotid artery extra cranially and intracranially with a stable TICI 2C revascularization of the left MCA distribution. An 8 French Angio-Seal closure device was then deployed at the groin puncture site for hemostasis. Distal pulses remained stable unchanged from prior to the procedure. A flat panel CT of the brain demonstrated no evidence of intracranial hemorrhage or mass effect. Patient was extubated. Pupils were a 5-6 mm equals left sluggishly reactive. Patient moved his left side spontaneously but followed no commands or showed movement in the right arm and leg. He was the transferred to the MICU for post revascularization care. Medications utilized aspirin 81 mg and Brilinta 90 mg via an orogastric tube early in anticipation of placement of a stent. IMPRESSION: Status post revascularization of occluded superior division of the left middle cerebral artery mid M2 segment, with 1 pass with a 3 mm x 20 mm Solitaire X retrieval device and contact aspiration, and 1 pass with a 4 mm x 40 mm Solitaire X retrieval device and contact aspiration, and 2 passes with the direct contact aspiration achieving a TICI 2C revascularization. PLAN: As  per referring MD. Electronically Signed   By: Julieanne Cotton M.D.   On: 03/23/2023 11:06   DG CHEST PORT 1 VIEW  Result Date: 03/23/2023 CLINICAL DATA:  100030 Leukocytosis 100030 EXAM: PORTABLE CHEST - 1 VIEW COMPARISON:  CT 08/14/2022 FINDINGS: focal airspace opacity laterally at the right lung base. Relatively low lung volumes with some crowding of bronchovascular structures particularly in the lung bases. Heart size and mediastinal contours are within normal limits. No effusion. Visualized bones unremarkable. IMPRESSION: Focal right lower lobe airspace disease. Electronically Signed   By: Corlis Leak M.D.   On: 03/23/2023 10:03   ECHOCARDIOGRAM COMPLETE  Result Date: 03/22/2023    ECHOCARDIOGRAM REPORT   Patient Name:   JAMIR VANDERHAM Date of Exam: 03/22/2023 Medical Rec #:  [id number]           Height:       70.0 in Accession #:    [id number]          Weight:       218.9 lb Date of Birth:  Apr 09, 1981          BSA:          2.169 m Patient Age:    41 years            BP:           146/81 mmHg Patient Gender: M                   HR:           95 bpm. Exam Location:  Inpatient Procedure: 2D Echo, Cardiac Doppler and Color Doppler Indications:    Stroke I63.9  History:        Patient has prior history of Echocardiogram examinations, most                 recent 07/18/2020. Stroke; Risk Factors:Hypertension and Current  Smoker.  Sonographer:    Dondra Prader RVT RCS Referring Phys: [phone] ERIC LINDZEN  Sonographer Comments: Image acquisition challenging due to uncooperative patient. IMPRESSIONS  1. Left ventricular ejection fraction, by estimation, is 60 to 65%. The left ventricle has normal function. The left ventricle has no regional wall motion abnormalities. Left ventricular diastolic parameters were normal.  2. Right ventricular systolic function is normal. The right ventricular size is normal. There is normal pulmonary artery systolic pressure.  3. The mitral valve is normal in  structure. No evidence of mitral valve regurgitation. No evidence of mitral stenosis.  4. The aortic valve is tricuspid. Aortic valve regurgitation is not visualized. No aortic stenosis is present.  5. The inferior vena cava is normal in size with greater than 50% respiratory variability, suggesting right atrial pressure of 3 mmHg. FINDINGS  Left Ventricle: Left ventricular ejection fraction, by estimation, is 60 to 65%. The left ventricle has normal function. The left ventricle has no regional wall motion abnormalities. The left ventricular internal cavity size was normal in size. There is  no left ventricular hypertrophy. Left ventricular diastolic parameters were normal. Indeterminate filling pressures. Right Ventricle: The right ventricular size is normal. No increase in right ventricular wall thickness. Right ventricular systolic function is normal. There is normal pulmonary artery systolic pressure. The tricuspid regurgitant velocity is 1.80 m/s, and  with an assumed right atrial pressure of 3 mmHg, the estimated right ventricular systolic pressure is 16.0 mmHg. Left Atrium: Left atrial size was normal in size. Right Atrium: Right atrial size was normal in size. Pericardium: There is no evidence of pericardial effusion. Mitral Valve: The mitral valve is normal in structure. No evidence of mitral valve regurgitation. No evidence of mitral valve stenosis. Tricuspid Valve: The tricuspid valve is normal in structure. Tricuspid valve regurgitation is trivial. No evidence of tricuspid stenosis. Aortic Valve: The aortic valve is tricuspid. Aortic valve regurgitation is not visualized. No aortic stenosis is present. Aortic valve mean gradient measures 6.0 mmHg. Aortic valve peak gradient measures 12.1 mmHg. Aortic valve area, by VTI measures 2.33  cm. Pulmonic Valve: The pulmonic valve was normal in structure. Pulmonic valve regurgitation is not visualized. No evidence of pulmonic stenosis. Aorta: The aortic root is  normal in size and structure. Venous: The inferior vena cava is normal in size with greater than 50% respiratory variability, suggesting right atrial pressure of 3 mmHg. IAS/Shunts: No atrial level shunt detected by color flow Doppler.  LEFT VENTRICLE PLAX 2D LVIDd:         4.00 cm   Diastology LVIDs:         2.90 cm   LV e' medial:    8.38 cm/s LV PW:         1.10 cm   LV E/e' medial:  10.9 LV IVS:        0.90 cm   LV e' lateral:   14.10 cm/s LVOT diam:     1.90 cm   LV E/e' lateral: 6.5 LV SV:         65 LV SV Index:   30 LVOT Area:     2.84 cm  RIGHT VENTRICLE             IVC RV Basal diam:  3.80 cm     IVC diam: 1.60 cm RV S prime:     12.60 cm/s TAPSE (M-mode): 2.4 cm LEFT ATRIUM             Index  RIGHT ATRIUM           Index LA diam:        2.90 cm 1.34 cm/m   RA Area:     18.80 cm LA Vol (A2C):   30.9 ml 14.25 ml/m  RA Volume:   59.50 ml  27.43 ml/m LA Vol (A4C):   39.6 ml 18.26 ml/m LA Biplane Vol: 37.3 ml 17.20 ml/m  AORTIC VALVE                     PULMONIC VALVE AV Area (Vmax):    2.53 cm      PV Vmax:       1.00 m/s AV Area (Vmean):   2.39 cm      PV Peak grad:  4.0 mmHg AV Area (VTI):     2.33 cm AV Vmax:           174.00 cm/s AV Vmean:          114.000 cm/s AV VTI:            0.278 m AV Peak Grad:      12.1 mmHg AV Mean Grad:      6.0 mmHg LVOT Vmax:         155.00 cm/s LVOT Vmean:        95.900 cm/s LVOT VTI:          0.228 m LVOT/AV VTI ratio: 0.82  AORTA Ao Root diam: 2.80 cm Ao Asc diam:  2.50 cm MITRAL VALVE               TRICUSPID VALVE MV Area (PHT): 4.19 cm    TR Peak grad:   13.0 mmHg MV Decel Time: 181 msec    TR Vmax:        180.00 cm/s MV E velocity: 91.70 cm/s MV A velocity: 70.50 cm/s  SHUNTS MV E/A ratio:  1.30        Systemic VTI:  0.23 m                            Systemic Diam: 1.90 cm Chilton Si MD Electronically signed by Chilton Si MD Signature Date/Time: 03/22/2023/4:04:21 PM    Final    MR ANGIO HEAD WO CONTRAST  Result Date: 03/22/2023 CLINICAL  DATA:  Stroke, follow-up. Status post T key 2 revascularization of an inferior left M2 branch. EXAM: MRA HEAD WITHOUT CONTRAST TECHNIQUE: Angiographic images of the Circle of Willis were acquired using MRA technique without intravenous contrast. COMPARISON:  Soft tissues the neck are otherwise unremarkable. Salivary glands are within normal limits. Thyroid is normal. No significant adenopathy is present. No focal mucosal or submucosal lesions are present. FINDINGS: Anterior circulation: The internal carotid arteries are within normal limits through the ICA termini. Left A1 segment is dominant. The anterior communicating artery is patent and both ACA vessels are fed from the left. The right A1 segment is aplastic. The right M1 segment is normal. The MCA bifurcation is normal. Right MCA branch vessels are within normal limits. Left M1 segment is unremarkable. The previously occluded M2 segment has again occluded. The posterior M2 segment is patent. Posterior circulation: The dural sinuses are patent. The straight sinus and deep cerebral veins are intact. Cortical veins are within normal limits. The right vertebral artery is dominant. PICA origins are visualized and normal. The vertebrobasilar junction and basilar artery are normal. Both superior cerebellar arteries are within normal  limits. Posterior cerebral arteries originate from the basilar tip. The PCA branch vessels are within normal limits bilaterally. Anatomic variants: None Other: None. IMPRESSION: 1. The previously occluded left M2 segment has again occluded. This corresponds with the area of expanded infarct. 2. Otherwise normal MRA circle-of-Willis without significant proximal stenosis, aneurysm, or branch vessel occlusion. 3. No evidence for dural sinus thrombosis. Electronically Signed   By: Marin Roberts M.D.   On: 03/22/2023 14:31   MR BRAIN WO CONTRAST  Result Date: 03/22/2023 CLINICAL DATA:  Stroke, follow-up. Status post TICI2  revascularization of occluded inferior left M2 branch. EXAM: MRI HEAD WITHOUT CONTRAST TECHNIQUE: Multiplanar, multiecho pulse sequences of the brain and surrounding structures were obtained without intravenous contrast. COMPARISON:  MR head without contrast 03/21/2023. FINDINGS: Brain: The previously seen infarct has increased in size. The infarcts now encompasses the majority of the insular cortex in the posterior left frontal operculum more confluent infarct extends into the high posterior left parietal lobe. T2 and FLAIR signal hyperintensity is associated with the areas of acute/subacute infarct. No acute hemorrhage is present. Some swelling of the cortex is noted without significant mass effect or midline shift. The ventricles are of normal size. No significant extraaxial fluid collection is present. The brainstem and cerebellum are within normal limits. The internal auditory canals are within normal limits. Midline structures are within normal limits. Vascular: Flow is present in the major intracranial arteries. Skull and upper cervical spine: The craniocervical junction is normal. Upper cervical spine is within normal limits. Marrow signal is unremarkable. Sinuses/Orbits: Moderate mucosal thickening is present the right maxillary sinus. Mild mucosal thickening is present in the anterior right ethmoid air cells and inferior right frontal sinus. No fluid levels are present. The paranasal sinuses and mastoid air cells are otherwise clear. The globes and orbits are within normal limits. IMPRESSION: 1. Interval increase in size of acute/subacute nonhemorrhagic infarct involving the majority of the left insular cortex and posterior left frontal operculum extending into the high posterior left parietal lobe. 2. No acute hemorrhage or significant mass effect or midline shift. 3. Right-sided sinus disease. Electronically Signed   By: Marin Roberts M.D.   On: 03/22/2023 14:17   MR BRAIN WO CONTRAST  Result  Date: 03/21/2023 CLINICAL DATA:  Neuro deficit, acute, stroke suspected. Right-sided weakness and facial droop. EXAM: MRI HEAD WITHOUT CONTRAST TECHNIQUE: Multiplanar, multiecho pulse sequences of the brain and surrounding structures were obtained without intravenous contrast. COMPARISON:  Head CT and CTA 03/21/2023.  Head MRI 07/17/2020. FINDINGS: The examination was discontinued at the direction of the attending neurologist prior to obtaining a sagittal T1 sequence due to the patient's condition. Brain: There are patchy acute cortical and subcortical infarcts in the left MCA territory involving the frontal and parietal lobes with corresponding mild FLAIR hyperintensity. 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 There is ex vacuo dilatation of the frontal horn of the left lateral ventricle. Small T2 hyperintensities scattered throughout the cerebral white matter elsewhere bilaterally are stable to slightly increased compared to the prior MRI and are moderately advanced for age. No mass, midline shift, or extra-axial fluid collection is evident. Vascular: Major intracranial vascular flow voids are preserved. Skull and upper cervical spine: No suspicious marrow lesion. Sinuses/Orbits: Unremarkable orbits. Mild mucosal thickening in the paranasal sinuses. Clear mastoid air cells. Other: None. IMPRESSION:  1. Patchy acute left MCA infarcts superimposed on chronic infarcts. 2. Moderately age advanced cerebral white matter T2 signal changes, nonspecific though may reflect chronic small vessel ischemia, migraines, vasculitis, sequelae of previous infection/inflammation, or demyelinating disease. Electronically Signed   By: Sebastian Ache M.D.   On: 03/21/2023 16:22   CT ANGIO HEAD NECK W WO CM W PERF (CODE STROKE)  Result Date: 03/21/2023 CLINICAL  DATA:  Neuro deficit, acute, stroke suspected. Right-sided weakness and facial droop. EXAM: CT ANGIOGRAPHY HEAD AND NECK CT PERFUSION BRAIN TECHNIQUE: Multidetector CT imaging of the head and neck was performed using the standard protocol during bolus administration of intravenous contrast. Multiplanar CT image reconstructions and MIPs were obtained to evaluate the vascular anatomy. Carotid stenosis measurements (when applicable) are obtained utilizing NASCET criteria, using the distal internal carotid diameter as the denominator. Multiphase CT imaging of the brain was performed following IV bolus contrast injection. Subsequent parametric perfusion maps were calculated using RAPID software. RADIATION DOSE REDUCTION: This exam was performed according to the departmental dose-optimization program which includes automated exposure control, adjustment of the mA and/or kV according to patient size and/or use of iterative reconstruction technique. CONTRAST:  OMNIPAQUE IOHEXOL 350 MG/ML SOLN COMPARISON:  Head and neck MRA 07/17/2020 FINDINGS: CTA NECK FINDINGS Aortic arch: Standard 3 vessel aortic arch with widely patent arch vessel origins. Right carotid system: Patent without evidence of stenosis or dissection. Tortuous distal cervical ICA. Left carotid system: Patent without evidence of stenosis or dissection. Vertebral arteries: Patent with the right being moderately dominant. Limited assessment of the left V1 segment due to venous contrast. No evidence of a significant stenosis or dissection elsewhere in the neck. Skeleton: No acute osseous abnormality or suspicious osseous lesion. Other neck: No evidence of cervical lymphadenopathy or mass. Upper chest: No mass or consolidation in the included lung apices. Review of the MIP images confirms the above findings CTA HEAD FINDINGS Anterior circulation: The internal carotid arteries are widely patent from skull base to carotid termini. The left M1 segment is patent  with a mild stenosis proximally. When comparing with the 2021 MRA, there is new occlusion of 2 separate proximal left M2 branches. The right MCA and both ACAs are patent without evidence of a significant proximal stenosis. The right A1 segment is absent. No aneurysm is identified. Posterior circulation: The intracranial vertebral arteries are patent to the basilar. Patent PICA and SCA origins are visualized bilaterally. The basilar artery is widely patent. Posterior communicating arteries are diminutive or absent. Both PCAs are patent without evidence of a significant proximal stenosis. No aneurysm is identified. Venous sinuses: Patent. Anatomic variants: Absent right A1 segment. Review of the MIP images confirms the above findings CT Brain Perfusion Findings: Failed processing due to either mistimed or inadequate bolus which could not be manually corrected. These results were communicated to Dr. Iver Nestle at 3:50 pm on 03/21/2023 by text page via the [facility] messaging system. IMPRESSION: 1. 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 2. Mild left M1 stenosis. 3. Widely patent carotid arteries. 4. Nondiagnostic CTP. Electronically Signed   By: Sebastian Ache M.D.   On: 03/21/2023 16:13   CT HEAD CODE STROKE WO CONTRAST  Result Date: 03/21/2023 CLINICAL DATA:  Code stroke.  Right-sided weakness and facial droop EXAM: CT HEAD WITHOUT CONTRAST TECHNIQUE: Contiguous axial images were obtained from the base of the skull through the vertex without intravenous contrast. RADIATION DOSE REDUCTION: This exam was performed according to the departmental dose-optimization program which includes automated  exposure control, adjustment of the mA and/or kV according to patient size and/or use of iterative reconstruction technique. COMPARISON:  CT Head 07/17/20 FINDINGS: Brain: Possible region of loss of gray-white differentiation in the left parietal lobe (series 3, image 26). No hemorrhage. No extra-axial fluid collection. No extra-axial  fluid collection. Vascular: No hyperdense vessel or unexpected calcification. Skull: Normal. Negative for fracture or focal lesion. Sinuses/Orbits: No middle ear or mastoid effusion. Frothy secretions in the right maxillary sinus, which can be seen in the setting of acute sinusitis. Orbits are unremarkable. Other: None. ASPECTS [facility] Stroke Program Early CT Score) - Ganglionic level infarction (caudate, lentiform nuclei, internal capsule, insula, M1-M3 cortex): 7 - Supraganglionic infarction (M4-M6 cortex): 2 Total score (0-10 with 10 being normal): 9 IMPRESSION: Findings are worrisome for an acute infarct in the posterior left parietal lobe. Aspects is 9. No hemorrhage. Findings were paged to Dr. Iver Nestle on 03/21/23 at 3:29 PM via [facility] paging system. Electronically Signed   By: Lorenza Cambridge M.D.   On: 03/21/2023 15:31    Labs:  Basic Metabolic Panel: No results for input(s): "NA", "K", "CL", "CO2", "GLUCOSE", "BUN", "CREATININE", "CALCIUM", "MG", "PHOS" in the last 168 hours.  CBC: No results for input(s): "WBC", "NEUTROABS", "HGB", "HCT", "MCV", "PLT" in the last 168 hours.  CBG: No results for input(s): "GLUCAP" in the last 168 hours.  Family history.  Mother with hypertension hyperlipidemia and breast cancer.  Maternal grandmother with diabetes.  Denies any colon cancer esophageal cancer or rectal cancer  Brief HPI:   Kavion Hottinger is a 42 y.o. 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  [facility]/21/[facility] with acute onset of right-sided weakness facial droop and aphasia.  Cranial CT scan worrisome for acute infarct in the posterior left parietal lobe.  CTA showed occlusion of proximal left M2 branch and mild left M1 stenosis.  Widely patent carotid arteries.  Admission chemistries  unremarkable except potassium 3.2 WBC 13,400, hemoglobin A1c 5.6 alcohol negative urine drug screen positive for cocaine as well as marijuana.  Patient underwent arteriogram with revascularization of occluded left MCA however it reoccluded.  MRI of the brain showed interval increase in size of acute/subacute nonhemorrhagic infarct involving the majority of the left insular cortex and posterior left frontal operculum extending into the high posterior left parietal lobe.  No acute hemorrhage or significant mass effect.  Echocardiogram with ejection fraction of 60 to 65% no wall motion abnormalities.  Placed on low-dose aspirin as well as Brilinta for CVA prophylaxis.  Lovenox added for DVT prophylaxis.  Hospital course complicated by bouts of agitation and restlessness and patient did make 1 attempt to leave the hospital but was ultimately able to be redirected.  Spiked a low-grade fever 100.7 chest x-ray revealed right lower lobe pneumonia initially placed on Zosyn and transition to Unasyn.  Maintained on mechanical soft thin liquid diet.  Therapy evaluations completed due to patient decreased functional mobility right side weakness and aphasia was admitted for a comprehensive rehab program.   Hospital Course: Dahlton Ruland was admitted to rehab 03/27/2023 for inpatient therapies to consist of PT, ST and OT at least three hours five days a week. Past admission physiatrist, therapy team and rehab RN have worked together to provide customized collaborative inpatient rehab.  Pertaining to patient's left MCA territory infarction due to left M2 occlusion status post  IR revascularization but reoccluded with likely large vessel disease from uncontrolled risk factors and cocaine use.  Patient remained stable he will continue low-dose aspirin and Brilinta with follow-up per neurology services.  Lovenox for DVT prophylaxis.  Mood stabilization with noted history of schizophrenia continued on Cogentin Depakote Lexapro  and Remeron with Abilify.  He did receive followed by neuropsychology.  His diet had been advanced to a regular consistency.  He continued on Lipitor for hyperlipidemia.  Patient's blood sugars remain well-controlled 98-126.  Blood pressure soft and lisinopril discontinued and would need outpatient follow-up.  History of tobacco alcohol drug use urine drug screen positive cocaine and marijuana patient did receive counts regards to cessation of illicit drug products.  Hospital course complicated by right lower lobe pneumonia completed course of Unasyn remained afebrile.   Blood pressures were monitored on TID basis and soft and monitored     Rehab course: During patient's stay in rehab weekly team conferences were held to monitor patient's progress, set goals and discuss barriers to discharge. At admission, patient required minimal guard without assistive device minimal guard sit to stand  Physical exam.  Blood pressure 128/89 pulse 88 temperature 98.6 respirations 18 oxygen saturation is 98% room air Constitutional.  No acute distress HEENT Head.  Normocephalic and atraumatic Eyes.  Pupils round and reactive to light no discharge without nystagmus Neck.  Supple nontender no JVD without thyromegaly Cardiac regular rate and rhythm without any extra sounds or murmur heard Abdomen.  Soft nontender positive bowel sounds without rebound Respiratory effort normal no respiratory distress without wheeze Neurologic.  Alert somewhat anxious severe expressive aphasia.  3/5 strength right upper extremity otherwise 5/5 strength.  He/She  has had improvement in activity tolerance, balance, postural control as well as ability to compensate for deficits. He/She has had improvement in functional use RUE/LUE  and RLE/LLE as well as improvement in awareness.  Patient requires contact-guard standby assist for functional mobility tasks without the use of assistive device.  He still demonstrates some decreased  awareness.  Patient can stand from edge of bed without the use of assistive device.  Required assistance for folding shirts during ADLs.  Bathing and shower level with close supervision and minimal verbal cues for safety awareness.  Dressing with contact-guard for standing balance.  Speech therapy follow-up for aphasia he required additional time with cueing increased to moderate assist given increased complexity of responses.  Required moderate assist to name moderately complex items around the room.  Full family teaching completed plan discharge to home       Disposition: Discharge to home    Diet: Regular  Special Instructions: No driving smoking or alcohol  Medications at discharge. 1.  Tylenol as needed 2.  Abilify 30 mg p.o. nightly 3.  Aspirin 81 mg p.o. daily 4.  Lipitor 40 mg p.o. daily 5.  Depakote 750 mg p.o. nightly 6.  Lexapro 20 mg p.o. daily 7.  Remeron 45 mg p.o. nightly 8.  NicoDerm patch taper as directed 9.  Brilinta 90 mg p.o. twice daily 10.  Glucophage 500 mg daily with breakfast 11.  Cogentin 1 mg nightly 12.  Florastor 250 mg twice daily   30-35 minutes were spent completing discharge summary and discharge planning  Discharge Instructions     Ambulatory referral to Neurology   Complete by: As directed    An appointment is requested in approximately: 4 weeks left MCA infarction   Ambulatory referral to Occupational Therapy   Complete by: As  directed    Eval and treat   Ambulatory referral to Physical Medicine Rehab   Complete by: As directed    Moderate complexity follow-up 1 to 2 weeks left MCA infarction   Ambulatory referral to Physical Therapy   Complete by: As directed    Eval and treat   Ambulatory referral to Speech Therapy   Complete by: As directed    Eval and treat        Follow-up Information     Elijah Birk C, DO Follow up.   Specialty: Physical Medicine and Rehabilitation Why: Office to call for appointment Contact  information: [address] [place] [address] [phone]         Julieanne Cotton, MD Follow up.   Specialties: Interventional Radiology, Radiology Why: Call for appointment Contact information: [address] [place] [address] [phone]                 Signed: Charlton Amor 04/10/2023, 5:11 AM

## 2023-04-05 NOTE — Progress Notes (Signed)
Occupational Therapy Session Note  Patient Details  Name: Brian Rios MRN: [id number] Date of Birth: [birth year]  Today's Date: 04/05/2023 OT Individual Time: 0700-0810 OT Individual Time Calculation (min): 70 min    Short Term Goals: Week 2:  OT Short Term Goal 1 (Week 2): STG=LTG 2/2 ELOS  Skilled Therapeutic Interventions/Progress Updates:    Pt resting in bed upon arrival and rady to eat breakfast. Pt required assistance to open containers but was able to self feed without supervision and min verbal cues for swallowing strategies. Pt declined bathing/dressing this moring. Amb to sink to brush teeth. Task completed with supervision. Pt amb to ortho gym with supervision. Increased speed with no LOB noted. Attempted to don boxing gloves but unable to sustain finger extension adequately to don RUE glove. Pt returned to day room. RUE activities with Squigz-on table and on window. Mod verbal cues for finger extension when grasping and releasing objects. Pt returned to bed at end of session. Bed alarm activated. All needs within reach.Saebo placed on RUE finger/wrist extensors at end of session.  Saebo Stim One Unattended 330 pulse width 35 Hz pulse rate On 8 sec/ off 8 sec Ramp up/ down 2 sec Symmetrical Biphasic wave form  Max intensity at 500 Ohm load  OTA removed Saebo upon completion. No adverse reactions noted.     Therapy Documentation Precautions:  Precautions Precautions: Fall Restrictions Weight Bearing Restrictions: No Pain:  Pt denies pain this morning    Therapy/Group: Individual Therapy  Rich Brave 04/05/2023, 8:06 AM

## 2023-04-05 NOTE — Progress Notes (Signed)
Patient ID: Delshaun Montiel, male   DOB: 1981-06-17, 42 y.o.   MRN: [id number]  [id number] spoke with [name] to provide updates from team conference on gains made, and d/c date 6/19. SW discussed if able to come in for family edu to see his progress. She continues to report she does not have transportation so unsure if she is able to get a ride here. SW dicussed if able to get a ride, asked her to check his schedule the day before to come in for a scheduled session. SW asked therapy team to call his mother in the event she is not able to come in.   Cecile Sheerer, MSW, LCSWA Office: [phone] Cell: [phone] Fax: [phone]

## 2023-04-05 NOTE — Progress Notes (Signed)
Patient having loose stool this evening. Reports no complaints of GI issues. Endorses that has been having loose stools since being in the hospital. No symptoms of nausea or vomiting. No spasticity or tic like motions observed by patient. During initial interaction does appear restless but does improve through the night. Circumlocution as patient having difficulty finding words and making complete sentences. Able to express self after going through what he was trying to say with descriptions/identifiers. Reports no pain at this time.

## 2023-04-05 NOTE — Progress Notes (Signed)
PROGRESS NOTE   Subjective/Complaints:  No events overnight. No acute complaints. Difficult to interview due to expressive aphasia.  Denies any pain, difficulty sleeping, concerns at this time State he has not been wearing WHO at nighttime.  ROS:  + Rash - groin -improved + Depression- difficult to assess d/t aphasia - stable No pains, no breathing issues, limited ROS due to aphasia   Objective:   No results found. No results for input(s): "WBC", "HGB", "HCT", "PLT" in the last 72 hours.  No results for input(s): "NA", "K", "CL", "CO2", "GLUCOSE", "BUN", "CREATININE", "CALCIUM" in the last 72 hours.   Intake/Output Summary (Last 24 hours) at 04/05/2023 1056 Last data filed at 04/05/2023 0810 Gross per 24 hour  Intake 720 ml  Output --  Net 720 ml         Physical Exam: Vital Signs Blood pressure 119/75, pulse 77, temperature 97.9 F (36.6 C), temperature source Oral, resp. rate 18, height 5\' 10"  (1.778 m), weight 91.6 kg, SpO2 99 %.  General: No acute distress. Sitting up at bedside.  HEENT: + L exotropia Psych: Flat affect , mood are appropriate.  Heart: Regular rate and rhythm no rubs murmurs or extra sounds Lungs: Clear to auscultation, breathing unlabored, no rales or wheezes Abdomen: Positive bowel sounds, soft nontender to palpation, nondistended. Abdominal scar, well healed Extremities: No clubbing, cyanosis, or edema Skin: No evidence of breakdown. + Maculopappular rash along R>L inner groin -improved   MSK:      No apparent deformity      Strength: All 4 limbs antigravity against resistance.                RUE: 4/5 except wrist and finger ext 3/5                LUE: 5 out of 5 throughout                RLE: 5 out of 5 throughout                LLE:  5 out of 5 throughout Neuro: MAS 1+ R finger flexors, wrist flexors  Oriented to person place day and month with options; difficulty wordfinding  spontaneously, can get 3/3 with extended time, remembers 3/3 with cues but cannot spontaneously reproduce names - unchanged      Assessment/Plan: 1. Functional deficits which require 3+ hours per day of interdisciplinary therapy in a comprehensive inpatient rehab setting. Physiatrist is providing close team supervision and 24 hour management of active medical problems listed below. Physiatrist and rehab team continue to assess barriers to discharge/monitor patient progress toward functional and medical goals  Care Tool:  Bathing    Body parts bathed by patient: Right arm, Chest, Abdomen, Front perineal area, Buttocks, Right upper leg, Left upper leg, Face, Right lower leg, Left lower leg   Body parts bathed by helper: Left arm     Bathing assist Assist Level: Minimal Assistance - Patient > 75%     Upper Body Dressing/Undressing Upper body dressing   What is the patient wearing?: Pull over shirt    Upper body assist Assist Level: Supervision/Verbal cueing  Lower Body Dressing/Undressing Lower body dressing      What is the patient wearing?: Pants, Underwear/pull up     Lower body assist Assist for lower body dressing: Contact Guard/Touching assist     Toileting Toileting    Toileting assist Assist for toileting: Independent     Transfers Chair/bed transfer  Transfers assist     Chair/bed transfer assist level: Minimal Assistance - Patient > 75%     Locomotion Ambulation   Ambulation assist      Assist level: Minimal Assistance - Patient > 75% Assistive device: No Device Max distance: 200+   Walk 10 feet activity   Assist     Assist level: Minimal Assistance - Patient > 75% Assistive device: No Device   Walk 50 feet activity   Assist    Assist level: Minimal Assistance - Patient > 75% Assistive device: No Device    Walk 150 feet activity   Assist    Assist level: Minimal Assistance - Patient > 75% Assistive device: No Device     Walk 10 feet on uneven surface  activity   Assist     Assist level: Minimal Assistance - Patient > 75% Assistive device: Other (comment) (No AD)   Wheelchair     Assist Is the patient using a wheelchair?: No             Wheelchair 50 feet with 2 turns activity    Assist            Wheelchair 150 feet activity     Assist          Blood pressure 119/75, pulse 77, temperature 97.9 F (36.6 C), temperature source Oral, resp. rate 18, height 5\' 10"  (1.778 m), weight 91.6 kg, SpO2 99 %.  Medical Problem List and Plan: 1. 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             -patient may shower             -ELOS/Goals: 5-7 days - SPV goals PT/OT, Min A SLP goals - 6/19 DC date  - 6/11: Per SW, family not willing to come in for family education, patient must be fully independent d/t mother being only family support and she is disabled. CGA for gait, poor awareness, anticipate SPV for community mobility at discharge. SLP somewhat impulsive with POS, some penetration/aspirations on thin liquids. Full SPV with self feeding due to impulsivity. Verbalizing more consistently, more apraxia than aphasia.   - Per SW, mom cannot arrange transport for family training; therapies to provide instruction over the phone, pt ambulating with SPV level today  - Reinforce use of WHO at nighttime for RUE tone   2.  Antithrombotics: -DVT/anticoagulation:  Pharmaceutical: Lovenox             -antiplatelet therapy: Aspirin 81 mg daily and Brilinta 90 mg twice daily  3. Pain Management: Tylenol as needed  4. Mood/Behavior/Sleep: Cogentin 1 mg nightly, Depakote 750 mg nightly, Lexapro 20 mg daily, Remeron 45 milligrams nightly             -hx of schizophrenia on antipsychotic agents: Abilify 30 mg nightly,  Seroquel 50 mg 3 times daily as needed - DC PRN  -6/11-12: D/w patient medications, mood; well-controlled, continue current regimen    5. Neuropsych/cognition: This patient is not capable  of making decisions on his own behalf.  -Already on a few antidepressants, and cognitive speed improving.  Attention appropriate per therapy notes.  No change in medications at this time  6. Skin/Wound Care: Routine skin checks   - 6/11: Groin rash - likely yeast. Nystatin powder x5 days.  Improved appearance.  7. Fluids/Electrolytes/Nutrition/dysphagia: Routine in and outs with follow-up chemistries -admission labs appropriate. Diet Orders (From admission, onward)     Start     Ordered   04/02/23 1507  Diet regular Room service appropriate? Yes; Fluid consistency: Thin  Diet effective now       Question Answer Comment  Room service appropriate? Yes   Fluid consistency: Thin      04/02/23 1507           - stop CBG checks 6/6   8.  Hypertension.  Lisinopril 10 mg daily.  Monitor with increased mobility.  Blood pressure well-controlled on current regimen, monitor. -6/8: Mildly hypotensive, DC his lisinopril 6/11-12: Stable; monitor      04/05/2023    5:43 AM 04/04/2023    1:05 PM 04/04/2023    4:58 AM  Vitals with BMI  Systolic 119 128 94  Diastolic 75 78 64  Pulse 77 100 66    9.  History of alcohol tobacco and drug use.  Urine drug screen positive cocaine as well as marijuana.  Continue NicoDerm patch.  Provide counseling   10.  Right lower lobe pneumonia.  Complete course of Unasyn.  Follow-up speech therapy   - 6/11: WBC stable 12-11; vitals stable, no s/s infection   11.  Hyperlipidemia. 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 Diarrhea. LBM 6/5; improving - resolved - LBM 6/14, multiple BM overnight      LOS: 9 days A FACE TO FACE EVALUATION WAS PERFORMED  Brian Rios 04/05/2023, 10:56 AM

## 2023-04-05 NOTE — Progress Notes (Signed)
Physical Therapy Session Note  Patient Details  Name: Brian Rios MRN: [id number] Date of Birth: January 14, 1981  Today's Date: 04/05/2023 PT Individual Time: 1st Treatment Session: [phone]; 2nd Treatment Session: 1130-1200 PT Individual Time Calculation (min): 60 min; 30 min   Short Term Goals: Week 2:  PT Short Term Goal 1 (Week 2): STGs=LTGs secondary to ELOS  Skilled Therapeutic Interventions/Progress Updates:  1st Treatment Session- Patient greeted supine in bed, but agreeable to PT treatment session. Patient gait trained throughout the fourth floor without the use of an AD and SBA for safety- Patient continues to demonstrate impaired B foot clearance leading to minor instabilities, however able to regain his balance without the assistance of therapist.   Patient tasked with reading a script stating "Hello, my name is [name]. Can you help me find a..." to various therapists throughout the three gyms. Patient was then to grab the smaller items and place them on a table. Once the list was completed, patient was to carry each item to their respective home with his Rt UE. Patient required Min/Mod cues throughout activity with good participation and improve automatic speech with increased repetition.  -[facility] -Hurdle -Ball -Clock -Pillow -Car -Weight -Mirror   At the end of treatment session, patient read all the items one more time with Min cues overall- "clock, weight and mirror" were the most challenging words for the patient, however able to verbalize with increased repetition and time.   Patient returned to his room and left sitting EOB with bed alarm on, call bell within reach and all needs met.    2nd Treatment Session- Patient greeted sitting EOB in room and agreeable to PT treatment session- Physician present performing morning rounds. Patient gait trained throughout all rehab gyms attempting to locate a mat table for various activities, however none available and patient  agreeable to ambulating outside. Patient gait trained throughout the fourth floor, on/off the elevators, throughout the first floor and outside on various surfaces. Patient was able to complete all gait without the use of an AD and SBA/Supv for safety- Patient demonstrated appropriate foot clearance with no instability noted throughout all gait trials. While outside patient took a seated rest break on a bench and was asked various questions about his life and interests with encouragement and Min/Mod cues for verbalization. Patient demonstrated good path-finding ability with only minor cues required for locating his room from the first floor. Patient left sitting EOB with bed alarm on, call bell within reach and all needs met.    Therapy Documentation Precautions:  Precautions Precautions: Fall Restrictions Weight Bearing Restrictions: No  Pain: No/Denies pain.    Therapy/Group: Individual Therapy  Anisha Starliper 04/05/2023, 7:53 AM

## 2023-04-06 DIAGNOSIS — I10 Essential (primary) hypertension: Secondary | ICD-10-CM

## 2023-04-06 DIAGNOSIS — R197 Diarrhea, unspecified: Secondary | ICD-10-CM

## 2023-04-06 MED ORDER — SACCHAROMYCES BOULARDII 250 MG PO CAPS
250.0000 mg | ORAL_CAPSULE | Freq: Two times a day (BID) | ORAL | Status: DC
  Administered 2023-04-06 – 2023-04-10 (×8): 250 mg via ORAL
  Filled 2023-04-06 (×8): qty 1

## 2023-04-06 NOTE — Progress Notes (Signed)
Occupational Therapy Session Note  Patient Details  Name: Brian Rios MRN: [id number] Date of Birth: [birth year]  Today's Date: 04/06/2023 OT Individual Time: 1445-1530 OT Individual Time Calculation (min): 45 min    Short Term Goals: Week 1:  OT Short Term Goal 1 (Week 1): LTG=STG 2/2 ELOS OT Short Term Goal 1 - Progress (Week 1): Progressing toward goal Week 2:  OT Short Term Goal 1 (Week 2): STG=LTG 2/2 ELOS  Skilled Therapeutic Interventions/Progress Updates:    Pt received in bed ready for therapy. Balance: Supervision with all activities   Neuromuscular Re-Education:  -pt continues to c/o diplopia, spent time assessing oculomotor control.  With R eye fully covered, using L eye only pt can see images but has exotropia (eye abducted) but with visual focus he can bring eye to midline and even slightly past midline. -had pt practice tracking his L finger with L eye with Rt eye covered and encouraged him to do this a few minutes several times a day -put sector occlusion on his glasses on temporal side of left lens to force eye into midline.  This does help but with both eyes open when his L eye is in midline, his Rt eye moves into abduction. Pt had difficulty with Rt eye control while working on L eye scanning with B eyes open. He shook his head that he felt the occlusion was not helping, asked him to trial it 48 hours until Monday.  Along with L eye scanning exercises. Showed pt how to do pencil push ups.  In this exercise his R eye was able to converge fully but L eye only to midline. He may need to see a neuro optometrist once his aphasia improves.  -RUE NMR with weight bearing with mini wall push ups, and the wall glides reaching arm to full ROM to try to keep fingers extended.  Used washcloth but difficult to keep cloth under hand so provided pt with washmit that velcros around his wrist.  This worked well and pt liked these exercises. Recommended he do the wall pushups and  slides at home.   ADL Retraining: -ambulated with supervision to laundry room to retrieve clothing from dryer with supervision. Pt placed all clothing in laundry bag and then carried bag to room. I raised bed height to table level so pt could stand and fold his clothing.  He used R hand as a stabilizing A to fold all clothing and them place them in his back pack.  Pt participated well. Pt resting in bed with all needs met. Alarm set and call light in reach.    Therapy Documentation Precautions:  Precautions Precautions: Fall Restrictions Weight Bearing Restrictions: No    Vital Signs: Therapy Vitals Temp: 97.8 F (36.6 C) Temp Source: Oral Pulse Rate: 94 Resp: 18 BP: 124/76 Patient Position (if appropriate): Lying Oxygen Therapy SpO2: 100 % O2 Device: Room Air Pain: Pain Assessment Pain Score: 0-No pain     Therapy/Group: Individual Therapy  Kassadi Presswood 04/06/2023, 4:55 PM

## 2023-04-06 NOTE — Progress Notes (Signed)
Physical Therapy Session Note  Patient Details  Name: Brian Rios MRN: [id number] Date of Birth: 1981-02-06  Today's Date: 04/06/2023 PT Individual Time: 0923-1023 PT Individual Time Calculation (min): 60 min   Short Term Goals: Week 2:  PT Short Term Goal 1 (Week 2): STGs=LTGs secondary to ELOS  Skilled Therapeutic Interventions/Progress Updates:    Pt received supine in bed awake and agreeable to therapy session. Supine>sitting L EOB, HOB partially elevated mod-I. Pt holding bag of clothes in his L hand requesting to do laundry - had pt try to articulate this with verbal communication and he was able to get out a short phrase to communicate this.   Sit<>stands, no AD, with SBA for safety during session.   Gait training >258ft around CIR, no AD, with close SBA for safety.  Pt able to place laundry in washing machine using B UEs and therapist cuing for increased use of R hand - pt starts to drop clothes with R hand due to inattention and poor proprioceptive awareness.  Notified nursing and therapy staff that pt started laundry to assist him with retrieving them later.   In ADL apartment kitchen, while standing for dual-task, worked on R UE NMR and verbal naming of familiar objects including:  - plates - small plates - bowels* - cups* - forks* - spoons* - spatula - ice cream scoop*  - can opener  Pt was tasked with opening drawers and cabinets to retrieve the objects identified above using R hand only. Pt continues to have difficulty using fingers to assist with grasp and often compensates by picking things up between his thumb and 1st finger.   Pt requires quiet environment to name items more successfully and benefits from seeing the words written. An * above means that those words were more challenging for pt to verbalize.   Performed block practice R UE NMR task of focusing on going from hand flat on table to achieve full finger extension to then creating backwards "C"  shape with hand in preparation to pick-up a cup correctly - noticed pt with apraxia in motor planning  timing/sequencing/coordinating finger movements often squeezing fingers closed prior to having hand/fingers fully around the object. Pt with strong finger movements into flexion and weaker into extension making it more challenging to get fingers open around objects.  Transitioned to verbal naming of familiar food items in pantry (applesauce, pizza, Cheese-its, etc.) and carrying over hand movement just learned to pick-up these weighted items.  Pt benefited from visual demonstration of hand movements to improve his motor planning.  At end of session, pt left supine in bed with needs in reach and bed alarm on.    Therapy Documentation Precautions:  Precautions Precautions: Fall Restrictions Weight Bearing Restrictions: No   Pain:  Denies pain during session.    Therapy/Group: Individual Therapy  Ginny Forth , PT, DPT, NCS, CSRS 04/06/2023, 7:52 AM

## 2023-04-06 NOTE — Progress Notes (Signed)
PROGRESS NOTE   Subjective/Complaints:  No new concerns this morning.  Denies pain.  Had a few liquid bowel movements yesterday.  ROS:  + Rash - groin -improved + Depression- difficult to assess d/t aphasia - stable No pains, no breathing issues, limited ROS due to aphasia   Objective:   No results found. No results for input(s): "WBC", "HGB", "HCT", "PLT" in the last 72 hours.  No results for input(s): "NA", "K", "CL", "CO2", "GLUCOSE", "BUN", "CREATININE", "CALCIUM" in the last 72 hours.   Intake/Output Summary (Last 24 hours) at 04/06/2023 1926 Last data filed at 04/06/2023 1813 Gross per 24 hour  Intake 1200 ml  Output --  Net 1200 ml         Physical Exam: Vital Signs Blood pressure 124/76, pulse 94, temperature 97.8 F (36.6 C), temperature source Oral, resp. rate 18, height 5\' 10"  (1.778 m), weight 91.6 kg, SpO2 100 %.  General: No acute distress. Sitting up at bedside.  HEENT: + L exotropia Psych: Flat affect , mood are appropriate.  Heart: Regular rate and rhythm no rubs murmurs or extra sounds Lungs: Clear to auscultation, breathing unlabored, no rales or wheezes Abdomen: Positive bowel sounds, soft nontender to palpation, nondistended. Abdominal scar, well healed Extremities: No clubbing, cyanosis, or edema Skin: No evidence of breakdown. + Maculopappular rash along R>L inner groin -improved   MSK:      No apparent deformity      Strength: All 4 limbs antigravity against resistance.                RUE: 4/5 except wrist and finger ext 3/5                LUE: 5 out of 5 throughout                RLE: 5 out of 5 throughout                LLE:  5 out of 5 throughout Neuro: MAS 1+ R finger flexors, wrist flexors  Oriented to person place day and month with options; difficulty wordfinding spontaneously, can get 3/3 with extended time, remembers 3/3 with cues but cannot spontaneously reproduce names -  unchanged      Assessment/Plan: 1. Functional deficits which require 3+ hours per day of interdisciplinary therapy in a comprehensive inpatient rehab setting. Physiatrist is providing close team supervision and 24 hour management of active medical problems listed below. Physiatrist and rehab team continue to assess barriers to discharge/monitor patient progress toward functional and medical goals  Care Tool:  Bathing    Body parts bathed by patient: Right arm, Chest, Abdomen, Front perineal area, Buttocks, Right upper leg, Left upper leg, Face, Right lower leg, Left lower leg   Body parts bathed by helper: Left arm     Bathing assist Assist Level: Minimal Assistance - Patient > 75%     Upper Body Dressing/Undressing Upper body dressing   What is the patient wearing?: Pull over shirt    Upper body assist Assist Level: Supervision/Verbal cueing    Lower Body Dressing/Undressing Lower body dressing      What is the patient wearing?:  Pants, Underwear/pull up     Lower body assist Assist for lower body dressing: Contact Guard/Touching assist     Toileting Toileting    Toileting assist Assist for toileting: Independent     Transfers Chair/bed transfer  Transfers assist     Chair/bed transfer assist level: Minimal Assistance - Patient > 75%     Locomotion Ambulation   Ambulation assist      Assist level: Minimal Assistance - Patient > 75% Assistive device: No Device Max distance: 200+   Walk 10 feet activity   Assist     Assist level: Minimal Assistance - Patient > 75% Assistive device: No Device   Walk 50 feet activity   Assist    Assist level: Minimal Assistance - Patient > 75% Assistive device: No Device    Walk 150 feet activity   Assist    Assist level: Minimal Assistance - Patient > 75% Assistive device: No Device    Walk 10 feet on uneven surface  activity   Assist     Assist level: Minimal Assistance - Patient >  75% Assistive device: Other (comment) (No AD)   Wheelchair     Assist Is the patient using a wheelchair?: No             Wheelchair 50 feet with 2 turns activity    Assist            Wheelchair 150 feet activity     Assist          Blood pressure 124/76, pulse 94, temperature 97.8 F (36.6 C), temperature source Oral, resp. rate 18, height 5\' 10"  (1.778 m), weight 91.6 kg, SpO2 100 %.  Medical Problem List and Plan: 1. 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             -patient may shower             -ELOS/Goals: 5-7 days - SPV goals PT/OT, Min A SLP goals - 6/19 DC date  - 6/11: Per SW, family not willing to come in for family education, patient must be fully independent d/t mother being only family support and she is disabled. CGA for gait, poor awareness, anticipate SPV for community mobility at discharge. SLP somewhat impulsive with POS, some penetration/aspirations on thin liquids. Full SPV with self feeding due to impulsivity. Verbalizing more consistently, more apraxia than aphasia.   - Per SW, mom cannot arrange transport for family training; therapies to provide instruction over the phone, pt ambulating with SPV level today  - Reinforce use of WHO at nighttime for RUE tone   2.  Antithrombotics: -DVT/anticoagulation:  Pharmaceutical: Lovenox             -antiplatelet therapy: Aspirin 81 mg daily and Brilinta 90 mg twice daily  3. Pain Management: Tylenol as needed  4. Mood/Behavior/Sleep: Cogentin 1 mg nightly, Depakote 750 mg nightly, Lexapro 20 mg daily, Remeron 45 milligrams nightly             -hx of schizophrenia on antipsychotic agents: Abilify 30 mg nightly, Seroquel 50 mg 3 times daily as needed - DC PRN  -6/11-12: D/w patient medications, mood; well-controlled,  continue current regimen    5. Neuropsych/cognition: This patient is not capable of making decisions on his own behalf.  -Already on a few antidepressants, and cognitive speed  improving.  Attention appropriate per therapy notes.  No change in medications at this time  6. Skin/Wound Care: Routine skin checks   - 6/11: Groin rash - likely yeast. Nystatin powder x5 days.  Improved appearance.  7. Fluids/Electrolytes/Nutrition/dysphagia: Routine in and outs with follow-up chemistries -admission labs appropriate. Diet Orders (From admission, onward)     Start     Ordered   04/02/23 1507  Diet regular Room service appropriate? Yes; Fluid consistency: Thin  Diet effective now       Question Answer Comment  Room service appropriate? Yes   Fluid consistency: Thin      04/02/23 1507           - stop CBG checks 6/6   8.  Hypertension.  Lisinopril 10 mg daily.  Monitor with increased mobility.  Blood pressure well-controlled on current regimen, monitor. -6/8: Mildly hypotensive, DC his lisinopril 6/15 well controlled      04/06/2023    1:59 PM 04/06/2023    4:25 AM 04/05/2023    8:09 PM  Vitals with BMI  Systolic 124 119 161  Diastolic 76 75 80  Pulse 94 75 82    9.  History of alcohol tobacco and drug use.  Urine drug s creen positive cocaine as well as marijuana.  Continue NicoDerm patch.  Provide counseling   10.  Right lower lobe pneumonia.  Complete course of Unasyn.  Follow-up speech therapy   - 6/11: WBC stable 12-11; vitals stable, no s/s infection   11.  Hyperlipidemia. 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 Diarrhea. LBM 6/5; improving - resolved - LBM 6/14, multiple BM overnight. -LBM 6/15 few Bms yesterday, no laxatives scheduled, appears to be improving -will add probiotics      LOS: 10 days A FACE TO FACE EVALUATION WAS PERFORMED  Fanny Dance 04/06/2023, 7:26 PM

## 2023-04-07 NOTE — Progress Notes (Signed)
PROGRESS NOTE   Subjective/Complaints:  No events overnight noted.  He reports his diarrhea has improved.  He appears often frustrated with word finding difficulties. ROS:  + Rash - groin -improved + Depression- difficult to assess d/t aphasia - stable No pains, no breathing issues, limited ROS due to aphasia   Objective:   No results found. No results for input(s): "WBC", "HGB", "HCT", "PLT" in the last 72 hours.  No results for input(s): "NA", "K", "CL", "CO2", "GLUCOSE", "BUN", "CREATININE", "CALCIUM" in the last 72 hours.   Intake/Output Summary (Last 24 hours) at 04/07/2023 1759 Last data filed at 04/07/2023 1300 Gross per 24 hour  Intake 1060 ml  Output 600 ml  Net 460 ml         Physical Exam: Vital Signs Blood pressure 116/81, pulse (!) 101, temperature (!) 97.5 F (36.4 C), temperature source Oral, resp. rate 18, height 5\' 10"  (1.778 m), weight 91.6 kg, SpO2 96 %.  General: No acute distress.  Sitting in bed  HEENT: + L exotropia-reports chronic Psych: Flat affect , mood are appropriate.  Appears frustrated with word finding difficulty Heart: RRR Lungs: Clear to auscultation, breathing unlabored, no rales or wheezes Abdomen: Positive bowel sounds, soft nontender to palpation, nondistended. Abdominal scar, well healed Extremities: No clubbing, cyanosis, or edema Skin: No evidence of breakdown. + Maculopappular rash along R>L inner groin -improved   MSK:      No apparent deformity      Strength: All 4 limbs antigravity against resistance.                RUE: 4/5 except wrist and finger ext 3/5                LUE: 5 out of 5 throughout                RLE: 5 out of 5 throughout                LLE:  5 out of 5 throughout Neuro: MAS 1+ R finger flexors, wrist flexors  Oriented to person place day and month with options; difficulty wordfinding spontaneously, can get 3/3 with extended time, remembers 3/3  with cues but cannot spontaneously reproduce names - unchanged      Assessment/Plan: 1. Functional deficits which require 3+ hours per day of interdisciplinary therapy in a comprehensive inpatient rehab setting. Physiatrist is providing close team supervision and 24 hour management of active medical problems listed below. Physiatrist and rehab team continue to assess barriers to discharge/monitor patient progress toward functional and medical goals  Care Tool:  Bathing    Body parts bathed by patient: Right arm, Chest, Abdomen, Front perineal area, Buttocks, Right upper leg, Left upper leg, Face, Right lower leg, Left lower leg   Body parts bathed by helper: Left arm     Bathing assist Assist Level: Minimal Assistance - Patient > 75%     Upper Body Dressing/Undressing Upper body dressing   What is the patient wearing?: Pull over shirt    Upper body assist Assist Level: Supervision/Verbal cueing    Lower Body Dressing/Undressing Lower body dressing      What  is the patient wearing?: Pants, Underwear/pull up     Lower body assist Assist for lower body dressing: Contact Guard/Touching assist     Toileting Toileting    Toileting assist Assist for toileting: Independent     Transfers Chair/bed transfer  Transfers assist     Chair/bed transfer assist level: Minimal Assistance - Patient > 75%     Locomotion Ambulation   Ambulation assist      Assist level: Contact Guard/Touching assist Assistive device: No Device Max distance: 200+   Walk 10 feet activity   Assist     Assist level: Minimal Assistance - Patient > 75% Assistive device: No Device   Walk 50 feet activity   Assist    Assist level: Minimal Assistance - Patient > 75% Assistive device: No Device    Walk 150 feet activity   Assist    Assist level: Minimal Assistance - Patient > 75% Assistive device: No Device    Walk 10 feet on uneven surface  activity   Assist      Assist level: Minimal Assistance - Patient > 75% Assistive device: Other (comment) (No AD)   Wheelchair     Assist Is the patient using a wheelchair?: No             Wheelchair 50 feet with 2 turns activity    Assist            Wheelchair 150 feet activity     Assist          Blood pressure 116/81, pulse (!) 101, temperature (!) 97.5 F (36.4 C), temperature source Oral, resp. rate 18, height 5\' 10"  (1.778 m), weight 91.6 kg, SpO2 96 %.  Medical Problem List and Plan: 1. 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             -patient may shower             -ELOS/Goals: 5-7 days - SPV goals PT/OT, Min A SLP goals - 6/19 DC date  - 6/11: Per SW, family not willing to come in for family education, patient must be fully independent d/t mother being only family support and she is disabled. CGA for gait, poor awareness, anticipate SPV for community mobility at discharge. SLP somewhat impulsive with POS, some penetration/aspirations on thin liquids. Full SPV with self feeding due to impulsivity. Verbalizing more consistently, more apraxia than aphasia.   - Per SW, mom cannot arrange transport for family training; therapies to provide instruction over the phone, pt ambulating with SPV level today  - Reinforce use of WHO at nighttime for RUE tone   2.  Antithrombotics: -DVT/anticoagulation:  Pharmaceutical: Lovenox             -antiplatelet therapy: Aspirin 81 mg daily and Brilinta 90 mg twice daily  3. Pain Management: Tylenol as needed  -6/16 denies pain today  4. Mood/Behavior/Sleep: Cogentin 1 mg nightly, Depakote 750 mg nightly, Lexapro 20 mg daily, Remeron 45 milligrams nightly             -hx of schizophrenia on antipsychotic agents: Abilify 30 mg nightly, Seroquel 50 mg 3 times  daily as needed - DC PRN  -6/11-12: D/w patient medications, mood; well-controlled, continue current regimen    5. Neuropsych/cognition: This patient is not capable of making decisions on his own behalf.  -  Already on a few antidepressants, and cognitive speed improving.  Attention appropriate per therapy notes.  No change in medications at this time  6. Skin/Wound Care: Routine skin checks   - 6/11: Groin rash - likely yeast. Nystatin powder x5 days.  Improved appearance.  7. Fluids/Electrolytes/Nutrition/dysphagia: Routine in and outs with follow-up chemistries -admission labs appropriate. Diet Orders (From admission, onward)     Start     Ordered   04/02/23 1507  Diet regular Room service appropriate? Yes; Fluid consistency: Thin  Diet effective now       Question Answer Comment  Room service appropriate? Yes   Fluid consistency: Thin      04/02/23 1507           - stop CBG checks 6/6   8.  Hypertension.  Lisinopril 10 mg daily.  Monitor with increased mobility.  Blood pressure well-controlled on current regimen, monitor. -6/8: Mildly hypotensive, DC his lisinopril 6/16 BP well-controlled, continue current regimen      04/07/2023    1:22 PM 04/07/2023    4:42 AM 04/06/2023    7:37 PM  Vitals with BMI  Systolic 116 114 161  Diastolic 81 75 81  Pulse 101 70 104    9.  History of alcohol tobacco and drug use.  Urine drug s creen positive cocaine as well as marijuana.  Continue NicoDerm patch.  Provide counseling   10.  Right lower lobe pneumonia.  Complete course of Unasyn.  Follow-up speech therapy   - 6/11: WBC stable 12-11; vitals stable, no s/s infection  -Denies shortness of breath   11.  Hyperlipidemia. 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 Diarrhea. LBM 6/5; improving - resolved - LBM 6/14, multiple BM overnight. -LBM 6/15 few Bms yesterday, no laxatives scheduled, appears to be improving -will add probiotics  -6/16 reports  diarrhea improved today     LOS: 11 days A FACE TO FACE EVALUATION WAS PERFORMED  Fanny Dance 04/07/2023, 5:59 PM

## 2023-04-07 NOTE — Plan of Care (Signed)
Problem: Consults Goal: RH STROKE PATIENT EDUCATION Description: See Patient Education module for education specifics  Outcome: Progressing   Problem: RH BOWEL ELIMINATION Goal: RH STG MANAGE BOWEL WITH ASSISTANCE Description: STG Manage Bowel with min Assistance. Outcome: Progressing Goal: RH STG MANAGE BOWEL W/MEDICATION W/ASSISTANCE Description: STG Manage Bowel with Medication with min Assistance. Outcome: Progressing   Problem: RH BLADDER ELIMINATION Goal: RH STG MANAGE BLADDER WITH ASSISTANCE Description: STG Manage Bladder With min Assistance Outcome: Progressing   Problem: RH SKIN INTEGRITY Goal: RH STG SKIN FREE OF INFECTION/BREAKDOWN Description: Skin will remain intact and be free of infection/breakdown with min assist  Outcome: Progressing Goal: RH STG ABLE TO PERFORM INCISION/WOUND CARE W/ASSISTANCE Description: STG Able To Perform Incision/Wound Care With min Assistance. Outcome: Progressing   Problem: RH SAFETY Goal: RH STG ADHERE TO SAFETY PRECAUTIONS W/ASSISTANCE/DEVICE Description: STG Adhere to Safety Precautions With cueing Assistance/Device. Outcome: Progressing Goal: RH STG DECREASED RISK OF FALL WITH ASSISTANCE Description: STG Decreased Risk of Fall With min Assistance. Outcome: Progressing   Problem: RH COGNITION-NURSING Goal: RH STG USES MEMORY AIDS/STRATEGIES W/ASSIST TO PROBLEM SOLVE Description: STG Uses Memory Aids/Strategies With cueing Assistance to Problem Solve. Outcome: Progressing   Problem: RH PAIN MANAGEMENT Goal: RH STG PAIN MANAGED AT OR BELOW PT'S PAIN GOAL Description: Pain will be managed at 4 out of 10 on pain scale with PRN medications min assist  Outcome: Progressing   Problem: RH KNOWLEDGE DEFICIT Goal: RH STG INCREASE KNOWLEDGE OF HYPERTENSION Description: Patient/caregiver will be able to manage HTN medications and life style changes to improve blood pressure from nursing education and nursing handouts independently   Outcome: Progressing Goal: RH STG INCREASE KNOWLEGDE OF HYPERLIPIDEMIA Description: Patient/caregiver will be able to manage cholesterol medications as well as diet and lifestyle changes to improve HDL levels from nursing education and nursing handouts independently  Outcome: Progressing Goal: RH STG INCREASE KNOWLEDGE OF STROKE PROPHYLAXIS Description: Patient/caregiver will be able to manage stroke medications (ASA/Brilinta) from nursing education and nursing handouts independently Outcome: Progressing   Problem: Education: Goal: Understanding of CV disease, CV risk reduction, and recovery process will improve Outcome: Progressing Goal: Individualized Educational Video(s) Outcome: Progressing   Problem: Activity: Goal: Ability to return to baseline activity level will improve Outcome: Progressing   Problem: Cardiovascular: Goal: Ability to achieve and maintain adequate cardiovascular perfusion will improve Outcome: Progressing Goal: Vascular access site(s) Level 0-1 will be maintained Outcome: Progressing   Problem: Health Behavior/Discharge Planning: Goal: Ability to safely manage health-related needs after discharge will improve Outcome: Progressing   Problem: Education: Goal: Knowledge of disease or condition will improve Outcome: Progressing Goal: Knowledge of secondary prevention will improve (MUST DOCUMENT ALL) Outcome: Progressing Goal: Knowledge of patient specific risk factors will improve Loraine Leriche N/A or DELETE if not current risk factor) Outcome: Progressing   Problem: Ischemic Stroke/TIA Tissue Perfusion: Goal: Complications of ischemic stroke/TIA will be minimized Outcome: Progressing   Problem: Coping: Goal: Will verbalize positive feelings about self Outcome: Progressing Goal: Will identify appropriate support needs Outcome: Progressing   Problem: Health Behavior/Discharge Planning: Goal: Ability to manage health-related needs will improve Outcome:  Progressing Goal: Goals will be collaboratively established with patient/family Outcome: Progressing   Problem: Self-Care: Goal: Ability to participate in self-care as condition permits will improve Outcome: Progressing Goal: Verbalization of feelings and concerns over difficulty with self-care will improve Outcome: Progressing Goal: Ability to communicate needs accurately will improve Outcome: Progressing   Problem: Nutrition: Goal: Risk of aspiration will decrease Outcome: Progressing Goal: Dietary intake  will improve Outcome: Progressing

## 2023-04-07 NOTE — Progress Notes (Signed)
Physical Therapy Session Note  Patient Details  Name: Brian Rios MRN: [id number] Date of Birth: [birth year]  Today's Date: 04/07/2023 PT Individual Time: 1001-1046 PT Individual Time Calculation (min): 45 min   Short Term Goals: Week 2:  PT Short Term Goal 1 (Week 2): STGs=LTGs secondary to ELOS  Skilled Therapeutic Interventions/Progress Updates:   Pt found sitting EOB upon PT arrival. No complaints of pain. Pt agreeable to treatment.   GT: GT without AD throughout unit > 200 ft with PT light CGA to light SBA due to pt frequent L LE buckling. Pt was able to self correct but buckling sudden and quick in nature and it was this PT's first time working with patient. Therefore, extra caution was shown due to frequency of buckling. Pt reports no pain with buckling.  NMR: Performed balance exercises in ADL apartment reaching outside of base of support and various angles, lifting items from above cabinets and below cabinets and pantry. Items ranged in weight. At times, PT encouraged him to utilize only his R UE and other times modified so bil Upper Extremities were used. Deep static squats were also maintained to reach into low cabinets. Weightshifting was performed with sweeping duties. Pt with difficulty using R hand and PT demonstrated various modifications to using R hand with performing carrying/reaching activities that would challenge patient balance. STS from edge of bed performed x 15 with concentration on balance in between each rep and eccentric descent. PT had patient count each repetition without advancement until the repetition number could be properly formulated independently or with PT assistance.   During ADL balance activities and gait, PT had patient read signs and tell her what items were. With prompting, he was able to complete difficult words.     Pt left sitting EOB, no complaints of pain, bed alarm on and all needs within reach.   Therapy Documentation Precautions:   Precautions Precautions: Fall Restrictions Weight Bearing Restrictions: No    Therapy/Group: Individual Therapy  Luna Fuse 04/07/2023, 7:35 AM

## 2023-04-08 MED ORDER — LOPERAMIDE HCL 2 MG PO CAPS
2.0000 mg | ORAL_CAPSULE | ORAL | Status: DC | PRN
  Administered 2023-04-08: 2 mg via ORAL
  Filled 2023-04-08: qty 1

## 2023-04-08 NOTE — Progress Notes (Signed)
Physical Therapy Session Note  Patient Details  Name: Brian Rios MRN: [id number] Date of Birth: November 12, 1980  Today's Date: 04/08/2023 PT Individual Time: 1st Treatment Session: [phone]; 2nd Treatment Session: 1300-1330 PT Individual Time Calculation (min): 73 min; 30 min  Short Term Goals: Week 2:  PT Short Term Goal 1 (Week 2): STGs=LTGs secondary to ELOS  Skilled Therapeutic Interventions/Progress Updates:  1st Treatment Session- Patient greeted sitting EOB in his room and agreeable to PT treatment session. Patient requesting to wash his clothes this morning- Therapist retrieved scrub top/pants and patient placed all of his dirty clothes into his laundry bag. RN present to administer morning medications. Patient sat EOB for medications and donning shirt all with distant supv. Patient ambulated to/from his room and laundry room with supv- Patient placed dirty clothes into the washing machine, added detergent and then was able to choose appropriate settings and start the washing machine without VC. Patient returned to his room to brush his teeth all with supv- Patient notably irritated this morning secondary to accidentally urinating on himself this morning. Patient gait trained to/from his room without AD and Supv- while seated in the rehab gym therapist cleaned his glasses.   Patient gait trained to/from day room and [employer] (>500') without the use of an AD and Supv- Patient continues to demonstrate poor Rt foot clearance leading to minor instability, however no physical assistance required from therapist. While ambulating, therapist and patient discussed his weekend and what he is looking forward to when discharging home.   Patient performed various neuro re-ed activities in order to improve overall motor planning and coordination with functional tasks for improved independence- Patient tasked with standing unsupported while performing alternating foot taps to various colored  cones- Performed >15 trials increasing VC from one-step to three-step commands. Patient with increased difficulty with three-step commands compared to one-step and confusing his Rt and Lt feet.   Patient then tasked with performing the same activity as above, however with 4# ankle weights donned while standing on airex foam, performed >15 trials. No LOB noted.   Patient performed alternating step-ups to airex foam pad with 4# weights donned to B ankles- Performed x10 total alternating each LE.   Patient performed lateral step-ups on/off airex foam pad with 4# weights donned to B ankles and CGA/SBA for safety, x 10 total.   Patient gait trained x180' without the use of an AD and SBA- Patient with 4# weights donned to B ankles and required VC initially for increased B step length and foot clearance with significant improvements noted with increased repetition. Patient then gait trained x180' without the ankle weights donned and improved gait mechanics noted throughout entire trial.   Patient returned to his room and left sitting EOB with bed alarm on, call bell within reach and all needs met.    2nd Treatment Session- Patient greeted sitting EOB in his room and agreeable to PT treatment session. Patient ambulated to/from his room and day room/laundry room without AD and Supv. Patient was able to locate a laundry basket in the day room, carry it to the laundry room, remove all of his clothes from the dryer and place in the laundry basket and carry back to his room. Patient stood on airex foam pad in front of his elevated bed while folding all of his laundry with Supv and no LOB noted- Patient demonstrated appropriate use of compensatory strategies throughout folding secondary to Rt hand weakness. Patient then ambulated to/from his room and ADL apartment/kitchen  where he verbalized what he had for lunch- "meatloaf and macaroni and cheese." Patient was then tasked with removing five items from the pantry,  verbalizing each item and then returning them to their respective locations all with his Rt UE. Completed with increased time and min cues required for verbalization. Patient left sitting EOB with bed alarm on, call bell within reach and all needs met.    Therapy Documentation Precautions:  Precautions Precautions: Fall Restrictions Weight Bearing Restrictions: No  Pain: No/Denies pain.    Therapy/Group: Individual Therapy  Kafi Dotter 04/08/2023, 7:51 AM

## 2023-04-08 NOTE — Progress Notes (Signed)
Speech Language Pathology Daily Session Note  Patient Details  Name: Brian Rios MRN: [id number] Date of Birth: November 02, 1980  Today's Date: 04/08/2023 SLP Individual Time: 1415-1500 SLP Individual Time Calculation (min): 45 min  Short Term Goals: Week 2: SLP Short Term Goal 1 (Week 2): STGs = LTGs 2* ELOS  Skilled Therapeutic Interventions: Skilled treatment session focused on communication goals. Upon arrival, patient awake in bed. Patient ambulated to the SLP office with supervision level verbal cues for visual scanning/attention to the right for obstacle navigation. SLP facilitated session by providing extra time and Min-Mod A verbal and articulatory cues for word-finding and verbal expression at the phrase and sentence level during a verbal picture description task. Patient sequenced 4 and 6 step picture cards with 100% accuracy throughout task. SLP also called the patient's mother to provide education regarding patient's functional communication and strategies to utilize to maximize word-finding and overall verbal expression. She verbalized understanding but will need reinforcement with SLP providing handouts to the patient. Patient ambulated back to the room with Mod I. Overall, patient with increased social engagement and sponaensouv verbalizations this session. Patient left upright sitting EOB with alarm on and all needs within reach. Continue with current plan of care.        Pain No/Denies Pain   Therapy/Group: Individual Therapy  Earlyne Feeser 04/08/2023, 3:08 PM

## 2023-04-08 NOTE — Progress Notes (Signed)
PROGRESS NOTE   Subjective/Complaints:  No acute complaints. No events overnight.  Vitals stable. Labs stable.   ROS:  + Rash - groin -improved + Depression- difficult to assess d/t aphasia - stable No pains, no breathing issues, limited ROS due to aphasia   Objective:   No results found. No results for input(s): "WBC", "HGB", "HCT", "PLT" in the last 72 hours.  No results for input(s): "NA", "K", "CL", "CO2", "GLUCOSE", "BUN", "CREATININE", "CALCIUM" in the last 72 hours.   Intake/Output Summary (Last 24 hours) at 04/08/2023 0829 Last data filed at 04/08/2023 0743 Gross per 24 hour  Intake 1060 ml  Output 100 ml  Net 960 ml         Physical Exam: Vital Signs Blood pressure 116/69, pulse 78, temperature 98 F (36.7 C), resp. rate 18, height 5\' 10"  (1.778 m), weight 91.6 kg, SpO2 96 %.  General: No acute distress. Sitting in Gym.  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  Appears frustrated with word finding difficulty Heart: RRR Lungs: Clear to auscultation, breathing unlabored, no rales or wheezes Abdomen: Positive bowel sounds, soft nontender to palpation, nondistended. Abdominal scar, well healed Extremities: No clubbing, cyanosis, or edema Skin: No evidence of breakdown.   MSK:      No apparent deformity      Strength: All 4 limbs antigravity against resistance.                RUE: 4/5 except wrist and finger ext 3/5  - ongoing                LUE: 5 out of 5 throughout                RLE: 5 out of 5 throughout                LLE:  5 out of 5 throughout Neuro: MAS 1+ R finger flexors, wrist flexors  Oriented to person place day and month with options +Difficulty wordfinding  Assessment/Plan: 1. Functional deficits which require 3+ hours per day of interdisciplinary therapy in a comprehensive inpatient rehab setting. Physiatrist is providing close team  supervision and 24 hour management of active medical problems listed below. Physiatrist and rehab team continue to assess barriers to discharge/monitor patient progress toward functional and medical goals  Care Tool:  Bathing    Body parts bathed by patient: Right arm, Chest, Abdomen, Front perineal area, Buttocks, Right upper leg, Left upper leg, Face, Right lower leg, Left lower leg   Body parts bathed by helper: Left arm     Bathing assist Assist Level: Minimal Assistance - Patient > 75%     Upper Body Dressing/Undressing Upper body dressing   What is the patient wearing?: Pull over shirt    Upper body assist Assist Level: Supervision/Verbal cueing    Lower Body Dressing/Undressing Lower body dressing      What is the patient wearing?: Pants, Underwear/pull up     Lower body assist Assist for lower body dressing: Contact Guard/Touching assist     Toileting Toileting    Toileting assist Assist for toileting: Independent  Transfers Chair/bed transfer  Transfers assist     Chair/bed transfer assist level: Minimal Assistance - Patient > 75%     Locomotion Ambulation   Ambulation assist      Assist level: Contact Guard/Touching assist Assistive device: No Device Max distance: 200+   Walk 10 feet activity   Assist     Assist level: Minimal Assistance - Patient > 75% Assistive device: No Device   Walk 50 feet activity   Assist    Assist level: Minimal Assistance - Patient > 75% Assistive device: No Device    Walk 150 feet activity   Assist    Assist level: Minimal Assistance - Patient > 75% Assistive device: No Device    Walk 10 feet on uneven surface  activity   Assist     Assist level: Minimal Assistance - Patient > 75% Assistive device: Other (comment) (No AD)   Wheelchair     Assist Is the patient using a wheelchair?: No             Wheelchair 50 feet with 2 turns activity    Assist             Wheelchair 150 feet activity     Assist          Blood pressure 116/69, pulse 78, temperature 98 F (36.7 C), resp. rate 18, height 5\' 10"  (1.778 m), weight 91.6 kg, SpO2 96 %.  Medical Problem List and Plan: 1. 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             -patient may shower             -ELOS/Goals: 5-7 days - SPV goals PT/OT, Min A SLP goals - 6/19 DC date  - 6/11: Per SW, family not willing to come in for family education, patient must be fully independent d/t mother being only family support and she is disabled. CGA for gait, poor awareness, anticipate SPV for community mobility at discharge. SLP somewhat impulsive with POS, some penetration/aspirations on thin liquids. Full SPV with self feeding due to impulsivity. Verbalizing more consistently, more apraxia than aphasia.   - Per SW, mom cannot arrange transport for family training; therapies to provide instruction over the phone, pt ambulating with SPV level today  - Reinforce use of WHO at nighttime for RUE tone  - 6/17: D/w patient's mother medical and functional status, progress toward SPV PT/OT, and need for SLP services as OP. She is very concerned about her own medical issues, having appointments in [facility] and not being able to supervise patient; would be agreeable to intermittent supervision, will discuss at teams.    2.  Antithrombotics: -DVT/anticoagulation:  Pharmaceutical: Lovenox             -antiplatelet therapy: Aspirin 81 mg daily and Brilinta 90 mg twice daily  3. Pain Management: Tylenol as needed  -6/16 denies pain today  4. Mood/Behavior/Sleep: Cogentin 1 mg nightly, Depakote 750 mg nightly, Lexapro 20 mg daily, Remeron 45 milligrams nightly             -hx of schizophrenia on antipsychotic agents: Abilify 30 mg  nightly, Seroquel 50 mg 3 times daily as needed - DC PRN  -6/11-17: D/w patient medications, mood; well-controlled, continue current regimen    5. Neuropsych/cognition: This patient is not capable of  making decisions on his own behalf.  -Already on a few antidepressants, and cognitive speed improving.  Attention appropriate per therapy notes.  No change in medications at this time  6. Skin/Wound Care: Routine skin checks   - 6/11: Groin rash - likely yeast. Nystatin powder x5 days.  Improved appearance.  7. Fluids/Electrolytes/Nutrition/dysphagia: Routine in and outs with follow-up chemistries -admission labs appropriate. Diet Orders (From admission, onward)     Start     Ordered   04/02/23 1507  Diet regular Room service appropriate? Yes; Fluid consistency: Thin  Diet effective now       Question Answer Comment  Room service appropriate? Yes   Fluid consistency: Thin      04/02/23 1507           - stop CBG checks 6/6   8.  Hypertension.  Lisinopril 10 mg daily.  Monitor with increased mobility.  Blood pressure well-controlled on current regimen, monitor. -6/8: Mildly hypotensive, DC his lisinopril 6/16 BP well-controlled, continue current regimen      04/08/2023    5:01 AM 04/07/2023   11:18 PM 04/07/2023    7:34 PM  Vitals with BMI  Systolic 116 117 86  Diastolic 69 71 65  Pulse 78 80 96    9.  History of alcohol tobacco and drug use.  Urine drug s creen positive cocaine as well as marijuana.  Continue NicoDerm patch.  Provide counseling   10.  Right lower lobe pneumonia.  Complete course of Unasyn.  Follow-up speech therapy   - 6/11: WBC stable 12-11; vitals stable, no s/s infection   11.  Hyperlipidemia. 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 Diarrhea. LBM 6/5; improving - resolved - LBM 6/14, multiple BM overnight. -LBM 6/15 few Bms yesterday, no laxatives scheduled, appears to be improving -will add probiotics  -6/16 reports  diarrhea improved today; 1x large liquid BM overnigght - add PRN immodium     LOS: 12 days A FACE TO FACE EVALUATION WAS PERFORMED  Angelina Sheriff 04/08/2023, 8:29 AM

## 2023-04-08 NOTE — Progress Notes (Signed)
Patient ID: Brian Rios, male   DOB: 05-16-1981, 42 y.o.   MRN: [id number]  SW received phone call from [name] reporting that she was not able to care for him and he needs to go to a skilled location. 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 SW also shared that he was not appropriate for skilled nursing home level of care. Appeared to understand the information shared. SW began to discuss outpatient therapies, and the line disconnected. SW made efforts to call back and call goes to voicemail.   SW called pt mother again to follow-up about call earlier. Continues to express concerns about providing care for him despite the updates provided on gains made in rehab, and continued therapy needed after rehab. Would like HH. SW shared too advanced for [facility] therapies now. She was fixated on his speech and inability to use his right arm even after updates provided. SW informed will have a physician follow-up with her to discuss her concerns in more detail. SW shared updates with medical team.   Cecile Sheerer, MSW, LCSWA Office: [phone] Cell: [phone] Fax: [phone]

## 2023-04-08 NOTE — Progress Notes (Signed)
Occupational Therapy Session Note  Patient Details  Name: Brian Rios MRN: [id number] Date of Birth: 1981-08-16  Today's Date: 04/08/2023 OT Individual Time: 8657-8469 OT Individual Time Calculation (min): 60 min    Short Term Goals: Week 2:  OT Short Term Goal 1 (Week 2): STG=LTG 2/2 ELOS  Skilled Therapeutic Interventions/Progress Updates:   Pt greeted semi-reclined in bed awake and agreeable to OT treatment session. Pt declined need to go to the bathroom or shower today. Pt ambulated to therapy gym without LOB. Addressed functional use of R UE with [employer] arm bike. Pt kept losing grip with R hand, so ACE wrap placed to secure hand. Pt completed 5 minutes forward, then 5 minutes back. Pt took rest break, then ACE wrap removed for 2 minutes using only R UE. Addressed grasp/release and balance with stepping to toss horse shoes. Pt then ambulated and bent over to pick them up with close supervision using R hand. Pt ambulated to laundry room to place clothes from top loader into front loader washer using R hand. Pt returned to room and transferred into bathroom. Pt voided bladder and completed toileting steps w/ supervision. Pt returned to bed and left seated EOB with bed alarm on, call bell in reach, and needs met.   Therapy Documentation Precautions:  Precautions Precautions: Fall Restrictions Weight Bearing Restrictions: No Pain: Pain Assessment Pain Scale: 0-10 Pain Score: 0-No pain   Therapy/Group: Individual Therapy  Mal Amabile 04/08/2023, 10:23 AM

## 2023-04-09 ENCOUNTER — Other Ambulatory Visit (HOSPITAL_COMMUNITY): Payer: Self-pay

## 2023-04-09 MED ORDER — NICOTINE 7 MG/24HR TD PT24
MEDICATED_PATCH | TRANSDERMAL | 0 refills | Status: DC
  Filled 2023-04-09: qty 21, 21d supply, fill #0

## 2023-04-09 MED ORDER — ACIDOPHILUS PO CAPS
ORAL_CAPSULE | Freq: Two times a day (BID) | ORAL | 8 refills | Status: DC
  Filled 2023-04-09: qty 100, 50d supply, fill #0

## 2023-04-09 MED ORDER — DIVALPROEX SODIUM ER 250 MG PO TB24
750.0000 mg | ORAL_TABLET | Freq: Every day | ORAL | 0 refills | Status: DC
  Filled 2023-04-09: qty 30, 10d supply, fill #0

## 2023-04-09 MED ORDER — MIRTAZAPINE 45 MG PO TABS
45.0000 mg | ORAL_TABLET | Freq: Every day | ORAL | 0 refills | Status: DC
  Filled 2023-04-09: qty 30, 30d supply, fill #0

## 2023-04-09 MED ORDER — ARIPIPRAZOLE 30 MG PO TABS
30.0000 mg | ORAL_TABLET | Freq: Every day | ORAL | 0 refills | Status: DC
  Filled 2023-04-09: qty 30, 30d supply, fill #0

## 2023-04-09 MED ORDER — ATORVASTATIN CALCIUM 40 MG PO TABS
40.0000 mg | ORAL_TABLET | Freq: Every day | ORAL | 0 refills | Status: DC
Start: 2023-04-09 — End: 2023-04-22
  Filled 2023-04-09: qty 30, 30d supply, fill #0

## 2023-04-09 MED ORDER — NICOTINE 14 MG/24HR TD PT24
14.0000 mg | MEDICATED_PATCH | TRANSDERMAL | 0 refills | Status: DC
  Filled 2023-04-09: qty 21, 21d supply, fill #0

## 2023-04-09 MED ORDER — NICOTINE 21 MG/24HR TD PT24
MEDICATED_PATCH | TRANSDERMAL | 0 refills | Status: DC
  Filled 2023-04-09: qty 7, 7d supply, fill #0

## 2023-04-09 MED ORDER — ESCITALOPRAM OXALATE 20 MG PO TABS
20.0000 mg | ORAL_TABLET | Freq: Every day | ORAL | 0 refills | Status: DC
  Filled 2023-04-09: qty 30, 30d supply, fill #0

## 2023-04-09 MED ORDER — BENZTROPINE MESYLATE 0.5 MG PO TABS
1.0000 mg | ORAL_TABLET | Freq: Every day | ORAL | 0 refills | Status: DC
  Filled 2023-04-09: qty 60, 30d supply, fill #0

## 2023-04-09 MED ORDER — TICAGRELOR 90 MG PO TABS
90.0000 mg | ORAL_TABLET | Freq: Two times a day (BID) | ORAL | 0 refills | Status: DC
  Filled 2023-04-09: qty 60, 30d supply, fill #0

## 2023-04-09 NOTE — Patient Care Conference (Signed)
Inpatient RehabilitationTeam Conference and Plan of Care Update Date: 04/09/2023   Time: 10:46 AM    Patient Name: Brian Rios      Medical Record Number: [id number]  Date of Birth: 24-Oct-1980 Sex: Male         Room/Bed: 4W15C/4W15C-01 Payor Info: Payor: MEDICAID [place] / Plan: MEDICAID  ACCESS / Product Type: *No Product type* /    Admit Date/Time:  03/27/2023  2:52 PM  Primary Diagnosis:  Left middle cerebral artery stroke [facility])  Hospital Problems: Principal Problem:   Left middle cerebral artery stroke [facility]) Active Problems:   Recurrent major depressive disorder, in partial remission (HCC)   Chronic schizophrenia [facility])    Expected Discharge Date: Expected Discharge Date: 04/10/23  Team Members Present: Physician leading conference: Dr. Elijah Birk Social Worker Present: Cecile Sheerer, LCSWA Nurse Present: Vedia Pereyra, RN PT Present: Amedeo Plenty, PT OT Present: Kearney Hard, OT SLP Present: Feliberto Gottron, SLP PPS Coordinator present : Fae Pippin, SLP     Current Status/Progress Goal Weekly Team Focus  Bowel/Bladder     Continent x 2. Occasionally spills urinal  Remain continent   Toilet every 4 hours and PRN    Swallow/Nutrition/ Hydration   Regular textures with thin liquids via cup, Mod I   Mod I  Patient and family education via telephone and handouts    ADL's   SUpervision/mod I for all BADL tasks   mod i/supervision   functional use of R UE, dc planning, self-care retraining    Mobility   Supv for all functional mobility without an AD   ModI/Supv with LRAD  Barriers: No true barriers to discharge, will need distant supv at time from his mother secondary to impaired awareness. Cotninuing to work on higher-level gait and neuro re-ed for improved independence with functional mobility for a safe discharge home.    Communication   Mod A for functional communication at the phrase or sentence level.   Mod-Max A for functional  communication   Patient and family education via telephone and handouts    Safety/Cognition/ Behavioral Observations  Supervision-Min A   Supervision for basic problem solving and Min A for emergent awarenes   Patient and family education via telephone and handouts    Pain     Denies pain  Remain pain free  Assess every 4 hours and PRN    Skin     CDI  Remain CDI  Assess every shift and PRN      Discharge Planning:  D/c plan remainst to return home to his mother's home. He needs to be mod I to supervision only as his mother is not physically about to assist him. His mother does not drive. Pt being sent up for outpatient PT/OT/SLP. SW will confirm there are no barriers to discharge.   Team Discussion: Left MCA/CVA. Getting Electra home for discharge due to mother not driving.  Patient on target to meet rehab goals: yes, patient meeting goals and discharging 04/10/23  *See Care Plan and progress notes for long and short-term goals.   Revisions to Treatment Plan:  Monitor mood and behaviors, labs, VS  Teaching Needs: Medications, safety, self care, skin care, gait/transfer training, etc.   Current Barriers to Discharge: Decreased caregiver support, Behavior, and transportation home  Possible Resolutions to Barriers: HEP, continue mood medications, uber home, order any recommended DME if needed.      Medical Summary Current Status: meidcally complicated by Hx schizophrenia on multiple mood medication, impulsivity, spasticity,  and  diarrhea  Barriers to Discharge: Behavior/Mood;Medical stability;Self-care education;Spasticity  Barriers to Discharge Comments: mood/behavior, spasticity Possible Resolutions to [employer] Focus: titrate medicaitons and brace to limit tone, monitor mood and cognitive on current medications   Continued Need for Acute Rehabilitation Level of Care: The patient requires daily medical management by a physician with specialized training in physical  medicine and rehabilitation for the following reasons: Direction of a multidisciplinary physical rehabilitation program to maximize functional independence : Yes Medical management of patient stability for increased activity during participation in an intensive rehabilitation regime.: Yes Analysis of laboratory values and/or radiology reports with any subsequent need for medication adjustment and/or medical intervention. : Yes   I attest that I was present, lead the team conference, and concur with the assessment and plan of the team.   Jearld Adjutant 04/09/2023, 8:55 AM

## 2023-04-09 NOTE — Progress Notes (Signed)
Inpatient Rehabilitation Discharge Medication Review by a Pharmacist  A complete drug regimen review was completed for this patient to identify any potential clinically significant medication issues.  High Risk Drug Classes Is patient taking? Indication by Medication  Antipsychotic Yes Abilify- schizophrenia  Anticoagulant No   Antibiotic No   Opioid No   Antiplatelet Yes Brilinta, aspirin- cva ppx  Hypoglycemics/insulin No   Vasoactive Medication No   Chemotherapy No   Other Yes Lipitor- HLD Cogentin- EPS ppx Depakote- seizure ppx Lexapro- MDD Remeron- MDD     Type of Medication Issue Identified Description of Issue Recommendation(s)  Drug Interaction(s) (clinically significant)     Duplicate Therapy     Allergy     No Medication Administration End Date     Incorrect Dose     Additional Drug Therapy Needed     Significant med changes from prior encounter (inform family/care partners about these prior to discharge).    Other       Clinically significant medication issues were identified that warrant physician communication and completion of prescribed/recommended actions by midnight of the next day:  No   Time spent performing this drug regimen review (minutes):  30   Merrell Rettinger BS, PharmD, BCPS Clinical Pharmacist 04/09/2023 7:01 AM  Contact: [phone] after 3 PM  "Be curious, not judgmental..." -Debbora Dus

## 2023-04-09 NOTE — Progress Notes (Signed)
Occupational Therapy Session Note  Patient Details  Name: Brian Rios MRN: [id number] Date of Birth: Aug 14, 1981  Today's Date: 04/09/2023 OT Individual Time: 1100-1200 OT Individual Time Calculation (min): 60 min    Short Term Goals: Week 2:  OT Short Term Goal 1 (Week 2): STG=LTG 2/2 ELOS  Skilled Therapeutic Interventions/Progress Updates:   Pt greeted semi-reclined in bed and agreeable to OT treatment session. Pt ambulated to therapy gym without device mod I. Addressed R hand FMC with graded peg board task using large pegs. Worked on pincer grasp, but unable to isolate finger movements yet. OT issued medium red theraputty and educated on there-ex for R hand. Pt demonstrated understanding with cues. OT assessed vision. Pt reported seeing 2, but then when reported only seeing one when questioned with number of fingers or objects. 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 OT placed tape on L side of glasses to help direct eye back to midline. OT made small foam cubes for pt to continue working on [facility] and pincer grasp at home. OT placed SAEBO e-stim on wrist extensors. SAEBO left on for 60 minutes. OT returned to remove SAEBO with skin intact and no adverse reactions.  [place] Stim One 330 pulse width 35 Hz pulse rate On 8 sec/ off 8 sec Ramp up/ down 2 sec Symmetrical Biphasic wave form  Max intensity at 500 Ohm load  Pt ambulated back to room and left seated EOB with bed alarm on, call bell in reach, and needs met.    Therapy Documentation Precautions:  Precautions Precautions: (P) Fall Restrictions Weight Bearing Restrictions: (P) No   Pain: Pain Assessment Pain Scale: 0-10 Pain Score: 0-No pain  Therapy/Group: Individual Therapy  Mal Amabile 04/09/2023, 12:18 PM

## 2023-04-09 NOTE — Plan of Care (Signed)
  Problem: RH Swallowing Goal: LTG Patient will consume least restrictive diet using compensatory strategies with assistance (SLP) Description: LTG:  Patient will consume least restrictive diet using compensatory strategies with assistance (SLP) Outcome: Completed/Met   Problem: RH Expression Communication Goal: LTG Patient will express needs/wants via multi-modal(SLP) Description: LTG:  Patient will express needs/wants via multi-modal communication (gestures/written, etc) with cues (SLP) Outcome: Completed/Met Goal: LTG Patient will increase word finding of common (SLP) Description: LTG:  Patient will increase word finding of common objects/daily info/abstract thoughts with cues using compensatory strategies (SLP). Outcome: Completed/Met   Problem: RH Problem Solving Goal: LTG Patient will demonstrate problem solving for (SLP) Description: LTG:  Patient will demonstrate problem solving for basic/complex daily situations with cues  (SLP) Outcome: Completed/Met   Problem: RH Awareness Goal: LTG: Patient will demonstrate awareness during functional activites type of (SLP) Description: LTG: Patient will demonstrate awareness during functional activites type of (SLP) Outcome: Completed/Met

## 2023-04-09 NOTE — Progress Notes (Signed)
PROGRESS NOTE   Subjective/Complaints:   Per therapies, was not attended to buy nursing aide overnight and had incontinent BM, then clothes were taken away but not washed. Very upset.  Overall Mod I level with supervision intemittently appropriate for discharge. Still some communication deficits but improving to sentence level now.   Patient currently with no concerns or complaints.   ROS:   Denies fevers, chills, N/V, abdominal pain, constipation, diarrhea, SOB, cough, chest pain, new weakness or paraesthesias.    Objective:   No results found. No results for input(s): "WBC", "HGB", "HCT", "PLT" in the last 72 hours.  No results for input(s): "NA", "K", "CL", "CO2", "GLUCOSE", "BUN", "CREATININE", "CALCIUM" in the last 72 hours.   Intake/Output Summary (Last 24 hours) at 04/09/2023 0938 Last data filed at 04/09/2023 0745 Gross per 24 hour  Intake 960 ml  Output --  Net 960 ml         Physical Exam: Vital Signs Blood pressure 119/79, pulse 74, temperature 97.8 F (36.6 C), resp. rate 18, height 5\' 10"  (1.778 m), weight 91.6 kg, SpO2 99 %.  General: No acute distress. Sitting up at bedside.  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  Heart: RRR, no m/r/g.  Lungs: Clear to auscultation, breathing unlabored, no rales or wheezes Abdomen: Positive bowel sounds, soft nontender to palpation, nondistended.   Extremities: No clubbing, cyanosis, or edema Skin: No evidence of breakdown. Groin rash improved.   MSK:      No apparent deformity      Strength: All 4 limbs antigravity against resistance.                RUE: 5-/5 except wrist and finger ext 3/5  - ongoing                LUE: 5 out of 5 throughout                RLE: 5 out of 5 throughout                LLE:  5 out of 5 throughout Neuro: MAS 1+ R finger flexors, wrist flexors; improving gradually AAOx4, +Difficulty  wordfinding improving, can respond to all commands appropriately 3/3.   Assessment/Plan: 1. Functional deficits which require 3+ hours per day of interdisciplinary therapy in a comprehensive inpatient rehab setting. Physiatrist is providing close team supervision and 24 hour management of active medical problems listed below. Physiatrist and rehab team continue to assess barriers to discharge/monitor patient progress toward functional and medical goals  Care Tool:  Bathing    Body parts bathed by patient: Right arm, Chest, Abdomen, Front perineal area, Buttocks, Right upper leg, Left upper leg, Face, Right lower leg, Left lower leg   Body parts bathed by helper: Left arm     Bathing assist Assist Level: Minimal Assistance - Patient > 75%     Upper Body Dressing/Undressing Upper body dressing   What is the patient wearing?: Pull over shirt    Upper body assist Assist Level: Supervision/Verbal cueing    Lower Body Dressing/Undressing Lower body dressing      What is the  patient wearing?: Pants, Underwear/pull up     Lower body assist Assist for lower body dressing: Contact Guard/Touching assist     Toileting Toileting    Toileting assist Assist for toileting: Independent     Transfers Chair/bed transfer  Transfers assist     Chair/bed transfer assist level: Minimal Assistance - Patient > 75%     Locomotion Ambulation   Ambulation assist      Assist level: Contact Guard/Touching assist Assistive device: No Device Max distance: 200+   Walk 10 feet activity   Assist     Assist level: Minimal Assistance - Patient > 75% Assistive device: No Device   Walk 50 feet activity   Assist    Assist level: Minimal Assistance - Patient > 75% Assistive device: No Device    Walk 150 feet activity   Assist    Assist level: Minimal Assistance - Patient > 75% Assistive device: No Device    Walk 10 feet on uneven surface  activity   Assist      Assist level: Minimal Assistance - Patient > 75% Assistive device: Other (comment) (No AD)   Wheelchair     Assist Is the patient using a wheelchair?: No             Wheelchair 50 feet with 2 turns activity    Assist            Wheelchair 150 feet activity     Assist          Blood pressure 119/79, pulse 74, temperature 97.8 F (36.6 C), resp. rate 18, height 5\' 10"  (1.778 m), weight 91.6 kg, SpO2 99 %.  Medical Problem List and Plan: 1. 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             -patient may shower             -ELOS/Goals: 5-7 days - SPV goals PT/OT, Min A SLP goals - 6/19 DC date  - 6/11: Per SW, family not willing to come in for family education, patient must be fully independent d/t mother being only family support and she is disabled. CGA for gait, poor awareness, anticipate SPV for community mobility at discharge. SLP somewhat impulsive with POS, some penetration/aspirations on thin liquids. Full SPV with self feeding due to impulsivity. Verbalizing more consistently, more apraxia than aphasia.   - Per SW, mom cannot arrange transport for family training; therapies to provide instruction over the phone, pt ambulating with SPV level today  - Reinforce use of WHO at nighttime for RUE tone  - 6/17: D/w patient's mother medical and functional status, progress toward SPV PT/OT, and need for SLP services as OP. She is very concerned about her own medical issues, having appointments in [facility] and not being able to supervise patient; would be agreeable to intermittent supervision, will discuss at teams.   -6/18: Per PT/OT, has met all goals, Mod I for ADLs and mobility, Min-Mod A for communication   2.  Antithrombotics: -DVT/anticoagulation:  Pharmaceutical: Lovenox              -antiplatelet therapy: Aspirin 81 mg daily and Brilinta 90 mg twice daily  3. Pain Management: Tylenol as needed  -6/16 denies pain today  4. Mood/Behavior/Sleep: Cogentin 1 mg nightly, Depakote 750 mg nightly, Lexapro 20 mg daily, Remeron  45 milligrams nightly             -hx of schizophrenia on antipsychotic agents: Abilify 30 mg nightly, Seroquel 50 mg 3 times daily as needed - DC PRN  -6/11-17: D/w patient medications, mood; well-controlled, continue current regimen    5. Neuropsych/cognition: This patient is not capable of making decisions on his own behalf.  -Already on a few antidepressants, and cognitive speed improving.  Attention appropriate per therapy notes.  No change in medications at this time  6. Skin/Wound Care: Routine skin checks   - 6/11: Groin rash - likely yeast. Nystatin powder x5 days.  Improved appearance.  7. Fluids/Electrolytes/Nutrition/dysphagia: Routine in and outs with follow-up chemistries -admission labs appropriate. Diet Orders (From admission, onward)     Start     Ordered   04/02/23 1507  Diet regular Room service appropriate? Yes; Fluid consistency: Thin  Diet effective now       Question Answer Comment  Room service appropriate? Yes   Fluid consistency: Thin      04/02/23 1507           - stop CBG checks 6/6   8.  Hypertension.  Lisinopril 10 mg daily.  Monitor with increased mobility.  Blood pressure well-controlled on current regimen, monitor. -6/8: Mildly hypotensive, DC his lisinopril 6/16 BP well-controlled, continue current regimen      04/09/2023    5:25 AM 04/08/2023    7:39 PM 04/08/2023   12:50 PM  Vitals with BMI  Systolic 119 118 161  Diastolic 79 73 85  Pulse 74 91 97    9.  History of alcohol tobacco and drug use.  Urine drug s creen positive cocaine as well as marijuana.  Continue NicoDerm patch.  Provide counseling   10.  Right lower lobe pneumonia.  Complete course of Unasyn.  Follow-up speech therapy   -  6/11: WBC stable 12-11; vitals stable, no s/s infection   11.  Hyperlipidemia. 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 Diarrhea. LBM 6/5; improving - resolved - LBM 6/14, multiple BM overnight. -LBM 6/15 few Bms yesterday, no laxatives scheduled, appears to be improving -will add probiotics  -6/16 reports diarrhea improved today; 1x large liquid BM overnigght - add PRN immodium - 6/18: No BM since yesterday; monitor     LOS: 13 days A FACE TO FACE EVALUATION WAS PERFORMED  Angelina Sheriff 04/09/2023, 9:38 AM

## 2023-04-09 NOTE — Progress Notes (Signed)
Occupational Therapy Discharge Summary  Patient Details  Name: Brian Rios MRN: [id number] Date of Birth: 02-19-1981  Date of Discharge from OT service:April 09, 2023  Today's Date: 04/09/2023 OT Individual Time: 1440-1511 OT Individual Time Calculation (min): 31 min   OT treatment session focused on UB there-ex and preparation for dc. Ambulation to therapy gym without device Mod I. UB there-ex using 2 lb weighted bar.  Seated row-3x10 Straight arm raise- 3x10 Bicep curl- 3x10 Chest press- 3x10 Pt then ambulated to therapy tub room and was educated and practiced tub bench transfer. Pt demonstrated understanding. Pt ambulated back to room Mod I and left seated EOB with needs met. Pt made mod I in room.   Patient has met 9 of 9 long term goals due to improved activity tolerance, improved balance, postural control, ability to compensate for deficits, and functional use of  RIGHT upper and RIGHT lower extremity.  Patient to discharge at overall Modified Independent/Supervision level.  Patient's care partner is independent to provide the necessary physical assistance at discharge.    Reasons goals not met: n/a  Recommendation:  Patient will benefit from ongoing skilled OT services in outpatient setting to continue to advance functional skills in the area of BADL and functional use of RUE .  Equipment: Tub transfer bench  Reasons for discharge: treatment goals met and discharge from hospital  Patient/family agrees with progress made and goals achieved: Yes  OT Discharge Precautions/Restrictions  Precautions Precautions: Fall Restrictions Weight Bearing Restrictions: No ADL ADL Eating: Set up Grooming: Setup Upper Body Bathing: Supervision/safety Lower Body Bathing: Supervision/safety Upper Body Dressing: Independent Lower Body Dressing: Independent Toileting: Independent Toilet Transfer: Independent Perception  Inattention/Neglect: Impaired-to be further tested in  functional context Praxis Praxis: Impaired Praxis Impairment Details: Motor planning Cognition Cognition Overall Cognitive Status: Within Functional Limits for tasks assessed Arousal/Alertness: Awake/alert Orientation Level: Person;Place;Situation Person: Oriented Place: Oriented Situation: Oriented Memory: Appears intact (functional memory WFL, unable to test recall w/ structured tasks 2* aphasia/apraxia) Attention: Sustained;Selective Sustained Attention: Appears intact Selective Attention: Impaired Selective Attention Impairment: Verbal complex Awareness: Appears intact Awareness Impairment: Emergent impairment Problem Solving: Appears intact (functional problem solving WFL) Problem Solving Impairment: Functional basic Safety/Judgment: Appears intact Comments: during functional tasks Brief Interview for Mental Status (BIMS) Repetition of Three Words (First Attempt): 3 Temporal Orientation: Year: Correct Temporal Orientation: Month: Accurate within 5 days Temporal Orientation: Day: Correct Recall: "Sock": Yes, no cue required Recall: "Blue": Yes, no cue required Recall: "Bed": No, could not recall BIMS Summary Score: 13 Sensation Sensation Light Touch: Impaired by gross assessment Additional Comments: impaired in the RUE- Mostly hand and fingers Coordination Gross Motor Movements are Fluid and Coordinated: No Fine Motor Movements are Fluid and Coordinated: No Coordination and Movement Description: Coordination improved since initial evaluation, however continues to have deficits Motor  Motor Motor: Hemiplegia Motor - Discharge Observations: Mild Rt hemi (UE>LE) Mobility  Bed Mobility Bed Mobility: Rolling Right;Rolling Left;Supine to Sit;Sit to Supine Rolling Right: Independent Rolling Left: Independent Supine to Sit: Independent Sit to Supine: Independent Transfers Sit to Stand: Independent with assistive device Stand to Sit: Independent with assistive device   Trunk/Postural Assessment  Cervical Assessment Cervical Assessment: Within Functional Limits Thoracic Assessment Thoracic Assessment: Exceptions to [facility] (Rounded shoulders) Lumbar Assessment Lumbar Assessment: Within Functional Limits  Balance Balance Balance Assessed: Yes Static Sitting Balance Static Sitting - Balance Support: Feet supported Static Sitting - Level of Assistance: 6: Modified independent (Device/Increase time) Dynamic Sitting Balance Dynamic Sitting - Balance Support: Feet  supported Dynamic Sitting - Level of Assistance: 6: Modified independent (Device/Increase time) Sitting balance - Comments: R lateral lean Static Standing Balance Static Standing - Balance Support: No upper extremity supported Static Standing - Level of Assistance: 6: Modified independent (Device/Increase time) Dynamic Standing Balance Dynamic Standing - Balance Support: During functional activity;No upper extremity supported Dynamic Standing - Level of Assistance: 6: Modified independent (Device/Increase time);5: Stand by assistance Extremity/Trunk Assessment RUE Assessment RUE Assessment: Exceptions to [facility] Passive Range of Motion (PROM) Comments: WFL RUE Body System: Neuro Brunstrum levels for arm and hand: Arm;Hand Brunstrum level for arm: Stage V Relative Independence from Synergy Brunstrum level for hand: Stage IV Movements deviating from synergies LUE Assessment LUE Assessment: Within Functional Limits   Merlene Laughter Josselin Gaulin 04/09/2023, 3:35 PM

## 2023-04-09 NOTE — Plan of Care (Signed)
  Problem: Sit to Stand Goal: LTG:  Patient will perform sit to stand in prep for activites of daily living with assistance level (OT) Description: LTG:  Patient will perform sit to stand in prep for activites of daily living with assistance level (OT) Outcome: Completed/Met   Problem: RH Eating Goal: LTG Patient will perform eating w/assist, cues/equip (OT) Description: LTG: Patient will perform eating with assist, with/without cues using equipment (OT) Outcome: Completed/Met   Problem: RH Grooming Goal: LTG Patient will perform grooming w/assist,cues/equip (OT) Description: LTG: Patient will perform grooming with assist, with/without cues using equipment (OT) Outcome: Completed/Met   Problem: RH Bathing Goal: LTG Patient will bathe all body parts with assist levels (OT) Description: LTG: Patient will bathe all body parts with assist levels (OT) Outcome: Completed/Met   Problem: RH Dressing Goal: LTG Patient will perform upper body dressing (OT) Description: LTG Patient will perform upper body dressing with assist, with/without cues (OT). Outcome: Completed/Met Goal: LTG Patient will perform lower body dressing w/assist (OT) Description: LTG: Patient will perform lower body dressing with assist, with/without cues in positioning using equipment (OT) Outcome: Completed/Met   Problem: RH Functional Use of Upper Extremity Goal: LTG Patient will use RT/LT upper extremity as a (OT) Description: LTG: Patient will use right/left upper extremity as a stabilizer/gross assist/diminished/nondominant/dominant level with assist, with/without cues during functional activity (OT) Outcome: Completed/Met   Problem: RH Toilet Transfers Goal: LTG Patient will perform toilet transfers w/assist (OT) Description: LTG: Patient will perform toilet transfers with assist, with/without cues using equipment (OT) Outcome: Completed/Met   Problem: RH Tub/Shower Transfers Goal: LTG Patient will perform  tub/shower transfers w/assist (OT) Description: LTG: Patient will perform tub/shower transfers with assist, with/without cues using equipment (OT) Outcome: Completed/Met

## 2023-04-09 NOTE — Progress Notes (Signed)
Speech Language Pathology Discharge Summary  Patient Details  Name: Brian Rios MRN: [id number] Date of Birth: Mar 03, 1981  Date of Discharge from SLP service:April 09, 2023  Today's Date: 04/09/2023 SLP Individual Time: 1000-1100 SLP Individual Time Calculation (min): 60 min   Skilled Therapeutic Interventions:  SLP facilitated a variety of tx/diagnostic tx tasks to target language and ID progress as compared to time of eval. Portions of the Western Aphasia Battery (WAB) were completed: simple Y/N questions (100%), Y/N complex (90%), 1 step, 2 step, and complex directions (100% modI), sentence completion (100%), responsive naming (100%), and confrontational naming (90%). Pt noted to answer biographical questions @ modI and required only cue x1 2* specific word finding during orientation questions. Pt also completed concrete generative naming task (2 items) w/ 90% accuracy given verbal approximations/distortions 2* apraxia. Given articulatory distortions/approximations, pt produced phrase-sentence length responses in conversation w/ spv-minA verbal cues. However, modA verbal/visual cues required for complex responses and spontaneous utterances 2* aphasia/apraxia. Pt left EOB w/ bed alarm activated. Call light and pt belongings within reach.     Patient has met 5 of 5 long term goals.  Patient to discharge at overall Mod level.   Reasons goals not met:   n/a  Clinical Impression/Discharge Summary:   Pt has made excellent progress thus far as evidenced by improved auditory comprehension, problem solving, awareness, and simple verbal expression (word-sentence level). Pt education complete and pt will discharge home w/ 24/7 supervision from family. Despite progress thus far, moderate expressive aphasia/apraxia remain overall. Pt would benefit from outpatient ST to further target verbal expression of increased complexity and facilitate improved communication of thoughts/ideas, potential return  to previous roles/responsibilities, and subsequent increase in overall pt independence.   Care Partner:  Caregiver Able to Provide Assistance: Yes  Type of Caregiver Assistance: Cognitive (language assistance (aphasia/apraxia))  Recommendation:  Outpatient SLP  Rationale for SLP Follow Up: Maximize functional communication;Reduce caregiver burden;Maximize cognitive function and independence   Equipment:   n/a  Reasons for discharge: Discharged from hospital   Patient/Family Agrees with Progress Made and Goals Achieved: Yes    Pati Gallo, M.S. CCC-SLP 04/09/2023, 12:24 PM

## 2023-04-09 NOTE — Progress Notes (Signed)
Physical Therapy Discharge Summary  Patient Details  Name: Brian Rios MRN: [id number] Date of Birth: October 24, 1980  Date of Discharge from PT service:April 09, 2023  Today's Date: 04/09/2023 PT Individual Time: 0800-0900 PT Individual Time Calculation (min): 60 min    Patient has met 10 of 10 long term goals due to improved balance, increased strength, ability to compensate for deficits, functional use of  right upper extremity and right lower extremity, improved attention, improved awareness, and improved coordination.  Patient to discharge at an ambulatory level Supervision/ModI.   Patient's care partner is independent to provide the necessary  Supv  assistance at discharge.  Reasons goals not met: NA  Recommendation:  Patient will benefit from ongoing skilled PT services in outpatient setting to continue to advance safe functional mobility, address ongoing impairments in dynamic stability, endurance/activity tolerance, higher-level gait, R NMR, and minimize fall risk.  Equipment: No equipment provided  Reasons for discharge: treatment goals met and discharge from hospital  Patient/family agrees with progress made and goals achieved: Yes  PT Discharge Precautions/Restrictions Precautions Precautions: Fall Restrictions Weight Bearing Restrictions: No Pain No/Denies pain.  Pain Interference Pain Interference Pain Effect on Sleep: 1. Rarely or not at all Pain Interference with Therapy Activities: 1. Rarely or not at all Pain Interference with Day-to-Day Activities: 1. Rarely or not at all Vision/Perception  Vision - History Ability to See in Adequate Light: 2 Moderately impaired Vision - Assessment Additional Comments: Patient reports blurry vision and double vision has gotten worse since being in the hospital- F/U may be necessary with a specialist Praxis Praxis: Impaired Praxis Impairment Details: Motor planning  Cognition Overall Cognitive Status: Within  Functional Limits for tasks assessed Arousal/Alertness: Awake/alert Orientation Level: Oriented X4 Year: 2024 Month: June Day of Week: Correct Safety/Judgment: Impaired Comments: Patient is not impulsive, however at times safety awareness is impaired Sensation Sensation Light Touch: Impaired by gross assessment Additional Comments: impaired in the RUE- Mostly hand and fingers Coordination Gross Motor Movements are Fluid and Coordinated: No Fine Motor Movements are Fluid and Coordinated: No Coordination and Movement Description: Coordination improved since initial evaluation, however continues to have deficits Motor  Motor Motor: Hemiplegia Motor - Discharge Observations: Mild Rt hemi (UE>LE)  Mobility Bed Mobility Bed Mobility: Rolling Right;Rolling Left;Supine to Sit;Sit to Supine Rolling Right: Independent Rolling Left: Independent Supine to Sit: Independent Sit to Supine: Independent Transfers Transfers: Sit to Stand;Stand to Sit;Stand Pivot Transfers Sit to Stand: Independent with assistive device Stand to Sit: Independent with assistive device Stand Pivot Transfers: Independent with assistive device Transfer (Assistive device): None Locomotion  Gait Ambulation: Yes Gait Assistance: Supervision/Verbal cueing;Independent with assistive device Gait Distance (Feet): 200 Feet Assistive device: None Gait Gait: Yes Gait Pattern: Impaired (Minor instances of poor foot clearance leading to LOB, however does not require assistance from therapist for steadying) Stairs / Additional Locomotion Stairs: Yes Stairs Assistance: Independent with assistive device Stair Management Technique: No rails Number of Stairs: 12 Height of Stairs: 6 Ramp: Independent with assistive device Curb: Independent with assistive device Wheelchair Mobility Wheelchair Mobility: No  Trunk/Postural Assessment  Cervical Assessment Cervical Assessment: Within Functional Limits Thoracic  Assessment Thoracic Assessment: Exceptions to [facility] (Rounded shoulders) Lumbar Assessment Lumbar Assessment: Within Functional Limits Postural Control Postural Control: Deficits on evaluation Righting Reactions: Delayed, however able to use appropriate stepping and ankle strategies  Balance Balance Balance Assessed: Yes Standardized Balance Assessment Standardized Balance Assessment: Berg Balance Test Berg Balance Test Sit to Stand: Able to stand without using hands and  stabilize independently Standing Unsupported: Able to stand safely 2 minutes Sitting with Back Unsupported but Feet Supported on Floor or Stool: Able to sit safely and securely 2 minutes Stand to Sit: Sits safely with minimal use of hands Transfers: Able to transfer safely, minor use of hands Standing Unsupported with Eyes Closed: Able to stand 10 seconds safely Standing Ubsupported with Feet Together: Able to place feet together independently and stand 1 minute safely From Standing, Reach Forward with Outstretched Arm: Can reach confidently >25 cm (10") From Standing Position, Pick up Object from Floor: Able to pick up shoe safely and easily From Standing Position, Turn to Look Behind Over each Shoulder: Looks behind from both sides and weight shifts well Turn 360 Degrees: Able to turn 360 degrees safely in 4 seconds or less Standing Unsupported, Alternately Place Feet on Step/Stool: Able to stand independently and safely and complete 8 steps in 20 seconds Standing Unsupported, One Foot in Front: Able to place foot tandem independently and hold 30 seconds Standing on One Leg: Able to lift leg independently and hold > 10 seconds Total Score: 56 Static Sitting Balance Static Sitting - Balance Support: Feet supported Static Sitting - Level of Assistance: 6: Modified independent (Device/Increase time) Dynamic Sitting Balance Dynamic Sitting - Balance Support: Feet supported Dynamic Sitting - Level of Assistance: 6:  Modified independent (Device/Increase time) Static Standing Balance Static Standing - Balance Support: No upper extremity supported Static Standing - Level of Assistance: 6: Modified independent (Device/Increase time) Dynamic Standing Balance Dynamic Standing - Balance Support: During functional activity;No upper extremity supported Dynamic Standing - Level of Assistance: 6: Modified independent (Device/Increase time);5: Stand by assistance Extremity Assessment      RLE Assessment RLE Assessment: Exceptions to [facility] General Strength Comments: Grossly 4+/5 with Rt hip flexors 4/5- Limited by mild ataxia LLE Assessment LLE Assessment: Within Functional Limits General Strength Comments: Grossly 5/5   Skilled Intervention- Patient greeted sitting EOB in room with RN present administering morning medications- Patient agreeable to PT treatment session. Patient ambulated throughout the fourth floor without the use of an AD and Supv/ModI- Patient is ModI with household distances however with further distances and in the community patient required Supv. Patient performed a car transfer, ascended/descended a low grade ramp, ambulated over mulch and ascended/descended 12 steps without the use of HR all without the use of an AD and supv/ModI. Patient started his laundry due to having an accident last evening and then switched the clothes over to the dryer halfway through treatment session. Patient performed a floor transfer ModI with not cueing or assistance required- Therapist spent time educating patient on safety after a fall and checking for any injuries. Patient completed the [facility] Assessment and scored a 56/56 at discharge, which was significantly improved from his 43/56 at eval. Patient demonstrates increased fall risk as noted by score of 56/56 on Berg Balance Scale.  (<36= high risk for falls, close to 100%; 37-45 significant >80%; 46-51 moderate >50%; 52-55 lower >25%). Patient provided with  HEP in order to improve balance and strength upon discharge home (please see below for further details). Patient returned to his room and left sitting EOB with bed alarm on, call bell within reach and all needs met.    Access Code: [email or web address] URL: [email or web address] Date: 04/09/2023 Prepared by: Sherron Ales Frederica Chrestman  Exercises - Single Leg Stance  - 1 x daily - 7 x weekly - 3 sets - 10 reps - Tandem Stance  - 1 x daily - 7  x weekly - 3 sets - 10 reps - Standing Narrow Base of Support  - 1 x daily - 7 x weekly - 3 sets - 10 reps - Sit to Stand with Arms Crossed  - 1 x daily - 7 x weekly - 3 sets - 10 reps - Standing 3-Way Leg Reach with Resistance at Ankles and Counter Support  - 1 x daily - 7 x weekly - 3 sets - 10 reps - Single Leg Balance with Opposite Leg Star Reach  - 1 x daily - 7 x weekly - 3 sets - 10 reps - Side Stepping with Resistance at Ankles  - 1 x daily - 7 x weekly - 3 sets - 10 reps - Forward Monster Walks  - 1 x daily - 7 x weekly - 3 sets - 10 reps - Backward Monster Walks  - 1 x daily - 7 x weekly - 3 sets - 10 reps    Gaylin Bulthuis 04/09/2023, 8:39 AM

## 2023-04-09 NOTE — Progress Notes (Signed)
Patient ID: Brian Rios, male   DOB: [birth year], 42 y.o.   MRN: [id number]  1157- SW spoke with pt mother Brian Rios to provide updates from team conference, and d/c date remains tomorrow. SW discussed can arrange for transportation home. States her landlord is going to bring her to pick up patient. SW discussed outpatient therapies recommendation. SW will send referral to [facility]. Amenable to this, and states transportation will transport him here.   SW received updates from OT pt will need tub transfer bench.   *SW met with pt in room to provide above updates, and d/c date remains tomorrow. Pt is eager to leave. He is aware SW will order TTB. Also aware referral for outpatient PT/OT/SLP will be sent to [facility]   SW ordered TTB with [facility] via parachute.   1426- SW called pt mother to inform a TTB was recommended and pt will have at discharge.   SW faxed outpatient PT/OT/SLP referral (p:[phone]/[phone]).  Cecile Sheerer, MSW, LCSWA Office: [phone] Cell: [phone] Fax: [phone]

## 2023-04-10 ENCOUNTER — Other Ambulatory Visit (HOSPITAL_COMMUNITY): Payer: Self-pay

## 2023-04-10 ENCOUNTER — Telehealth: Payer: Self-pay

## 2023-04-10 NOTE — Progress Notes (Signed)
Inpatient Rehabilitation Care Coordinator Discharge Note   Patient Details  Name: Brian Rios MRN: [id number] Date of Birth: [birth year]   Discharge location: HOME WITH MOM WHO IS THERE BUT NOT ABLE TO ASSIST  Length of Stay: 14 DAYS  Discharge activity level: SUPERVISION LEVLE DUE TO COGNITION  Home/community participation: ACTIVE  Patient response WU:[id number] Literacy - How often do you need to have someone help you when you read instructions, pamphlets, or other written material from your doctor or pharmacy?: Sometimes  Patient response YN:WGNFAO Isolation - How often do you feel lonely or isolated from those around you?: Rarely  Services provided included: MD, RD, PT, OT, SLP, RN, CM, TR, Pharmacy, Neuropsych, SW  Financial Services:  Financial Services Utilized: Medicaid    Choices offered to/list presented to: PT AND MOM  Follow-up services arranged:  Outpatient, DME, Patient/Family has no preference for HH/DME agencies    Outpatient Servicies: [facility]  PT  OT  SP DME : [facility]  TUB BENCH    Patient response to transportation need: Is the patient able to respond to transportation needs?: Yes In the past 12 months, has lack of transportation kept you from medical appointments or from getting medications?: No In the past 12 months, has lack of transportation kept you from meetings, work, or from getting things needed for daily living?: No   Patient/Family verbalized understanding of follow-up arrangements:  Yes  Individual responsible for coordination of the follow-up plan: DONNA-MOM [phone]  Confirmed correct DME delivered: Lucy Chris 04/10/2023    Comments (or additional information):PT DID WELL BUT STILL NEEDS SUPERVISION DUE TO NEEDS CUEING AND FOR SAFETY. MOM IS AWARE OF THIS.   Summary of Stay    Date/Time Discharge Planning CSW  04/09/23 1432 D/c plan remainst to return home to his mother's home. He needs to be mod I to  supervision only as his mother is not physically about to assist him. His mother does not drive. Pt being sent up for outpatient PT/OT/SLP. SW will confirm there are no barriers to discharge. AAC  04/08/23 1353 D/c plan remainst to return home to his mother's home. He needs to be mod I to supervision only as his mother is not physically about to assist him. His mother does not drive. Pt already established with HH with [facility] for HHPT/OT/SN. Request to add SLP. SW will confirm there are no barriers to discharge. AAC  04/02/23 0950 Pt to return home to his mother's home. He needs to be mod I to supervision only as his mothes is not physically about to assist him. His mother does not drive. Pt already established with HH with [facility] for HHPT/OT/SN. Request to add SLP. SW will confirm there are no barriers to discharge. AAC       Brian Rios, Brian Rios

## 2023-04-10 NOTE — Telephone Encounter (Signed)
error 

## 2023-04-10 NOTE — Progress Notes (Signed)
PROGRESS NOTE   Subjective/Complaints:  No acute complaints. No events overnight.  Stable for discharge. All questions answered at bedside.  She has no concerns, complaints.  ROS:   Denies fevers, chills, N/V, abdominal pain, constipation, diarrhea, SOB, cough, chest pain, new weakness or paraesthesias.    Objective:   No results found. No results for input(s): "WBC", "HGB", "HCT", "PLT" in the last 72 hours.  No results for input(s): "NA", "K", "CL", "CO2", "GLUCOSE", "BUN", "CREATININE", "CALCIUM" in the last 72 hours.   Intake/Output Summary (Last 24 hours) at 04/10/2023 0835 Last data filed at 04/09/2023 1834 Gross per 24 hour  Intake 476 ml  Output --  Net 476 ml         Physical Exam: Vital Signs Blood pressure 114/74, pulse 66, temperature 98.4 F (36.9 C), temperature source Oral, resp. rate 18, height 5\' 10"  (1.778 m), weight 91.6 kg, SpO2 100 %.  General: No acute distress.  Ambulating around room, collecting things. HEENT: + L exotropia-reports chronic Psych: Appropriate affect, mood are appropriate.  Heart: RRR, no m/r/g.  Lungs: Clear to auscultation, breathing unlabored, no rales or wheezes Abdomen: Positive bowel sounds, soft nontender to palpation, nondistended.   Extremities: No clubbing, cyanosis, or edema Skin: No evidence of breakdown. Groin rash improved.   MSK:      No apparent deformity      Strength: All 4 limbs antigravity against resistance.                RUE: 5-/5 except wrist and finger ext 3/5  - ongoing                LUE: 5 out of 5 throughout                RLE: 5 out of 5 throughout                LLE:  5 out of 5 throughout Neuro: MAS 1 R finger flexors, 0 wrist flexors; improving gradually AAOx4, +Difficulty wordfinding improving, can respond to all commands appropriately 3/3.  Speaking at the sentence level, responds to all questions appropriately with increased  time.  Assessment/Plan: 1. Functional deficits which require 3+ hours per day of interdisciplinary therapy in a comprehensive inpatient rehab setting. Physiatrist is providing close team supervision and 24 hour management of active medical problems listed below. Physiatrist and rehab team continue to assess barriers to discharge/monitor patient progress toward functional and medical goals  Care Tool:  Bathing    Body parts bathed by patient: Right arm, Chest, Abdomen, Front perineal area, Buttocks, Right upper leg, Left upper leg, Face, Right lower leg, Left lower leg, Left arm   Body parts bathed by helper: Left arm     Bathing assist Assist Level: Supervision/Verbal cueing     Upper Body Dressing/Undressing Upper body dressing   What is the patient wearing?: Pull over shirt    Upper body assist Assist Level: Independent    Lower Body Dressing/Undressing Lower body dressing      What is the patient wearing?: Pants, Underwear/pull up     Lower body assist Assist for lower body dressing: Supervision/Verbal cueing  Toileting Toileting    Toileting assist Assist for toileting: Independent     Transfers Chair/bed transfer  Transfers assist     Chair/bed transfer assist level: Independent     Locomotion Ambulation   Ambulation assist      Assist level: Supervision/Verbal cueing Assistive device: No Device Max distance: 200+   Walk 10 feet activity   Assist     Assist level: Independent Assistive device: No Device   Walk 50 feet activity   Assist    Assist level: Independent Assistive device: No Device    Walk 150 feet activity   Assist    Assist level: Supervision/Verbal cueing Assistive device: No Device    Walk 10 feet on uneven surface  activity   Assist     Assist level: Supervision/Verbal cueing Assistive device: Other (comment) (No AD)   Wheelchair     Assist Is the patient using a wheelchair?: No              Wheelchair 50 feet with 2 turns activity    Assist            Wheelchair 150 feet activity     Assist          Blood pressure 114/74, pulse 66, temperature 98.4 F (36.9 C), temperature source Oral, resp. rate 18, height 5\' 10"  (1.778 m), weight 91.6 kg, SpO2 100 %.  Medical Problem List and Plan: 1. 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             -patient may shower             -ELOS/Goals: 5-7 days - SPV goals PT/OT, Min A SLP goals - 6/19 DC date  - 6/11: Per SW, family not willing to come in for family education, patient must be fully independent d/t mother being only family support and she is disabled. CGA for gait, poor awareness, anticipate SPV for community mobility at discharge. SLP somewhat impulsive with POS, some penetration/aspirations on thin liquids. Full SPV with self feeding due to impulsivity. Verbalizing more consistently, more apraxia than aphasia.   - Per SW, mom cannot arrange transport for family training; therapies to provide instruction over the phone, pt ambulating with SPV level today  - Reinforce use of WHO at nighttime for RUE tone  - 6/17: D/w patient's mother medical and functional status, progress toward SPV PT/OT, and need for SLP services as OP. She is very concerned about her own medical issues, having appointments in [facility] and not being able to supervise patient; would be agreeable to intermittent supervision, will discuss at teams.   -6/18: Per PT/OT, has met all goals, Mod I for ADLs and mobility, Min-Mod A for communication  Stable for discharge from inpatient rehab   2.  Antithrombotics: -DVT/anticoagulation:  Pharmaceutical: Lovenox             -antiplatelet therapy: Aspirin 81 mg daily and Brilinta 90 mg twice daily  3. Pain Management:  Tylenol as needed  -6/16 denies pain today  4. Mood/Behavior/Sleep: Cogentin 1 mg nightly, Depakote 750 mg nightly, Lexapro 20 mg daily, Remeron 45 milligrams nightly             -hx of schizophrenia on antipsychotic agents: Abilify 30 mg nightly, Seroquel 50 mg 3 times daily as needed - DC PRN  -6/11-17:  D/w patient medications, mood; well-controlled, continue current regimen    5. Neuropsych/cognition: This patient is not capable of making decisions on his own behalf.  -Already on a few antidepressants, and cognitive speed improving.  Attention appropriate per therapy notes.  No change in medications at this time  6. Skin/Wound Care: Routine skin checks   - 6/11: Groin rash - likely yeast. Nystatin powder x5 days.  Improved appearance.  7. Fluids/Electrolytes/Nutrition/dysphagia: Routine in and outs with follow-up chemistries -admission labs appropriate. Diet Orders (From admission, onward)     Start     Ordered   04/02/23 1507  Diet regular Room service appropriate? Yes; Fluid consistency: Thin  Diet effective now       Question Answer Comment  Room service appropriate? Yes   Fluid consistency: Thin      04/02/23 1507           - stop CBG checks 6/6   8.  Hypertension.  Lisinopril 10 mg daily.  Monitor with increased mobility.  Blood pressure well-controlled on current regimen, monitor. -6/8: Mildly hypotensive, DC his lisinopril 6/16-19 BP well-controlled, continue current regimen      04/10/2023    4:55 AM 04/09/2023    8:57 PM 04/09/2023    1:28 PM  Vitals with BMI  Systolic 114 117 784  Diastolic 74 71 76  Pulse 66 80 84    9.  History of alcohol tobacco and drug use.  Urine drug s creen positive cocaine as well as marijuana.  Continue NicoDerm patch.  Provide counseling   10.  Right lower lobe pneumonia.  Complete course of Unasyn.  Follow-up speech therapy   - 6/11: WBC stable 12-11; vitals stable, no s/s infection   11.  Hyperlipidemia. Continue Lipitor    12. Verbal Apraxia: continue SLP-improving  13. Diarrhea. LBM 6/5; improving - resolved - LBM 6/14, multiple BM overnight. -LBM 6/15 few Bms yesterday, no laxatives scheduled, appears to be improving -will add probiotics  -6/16 reports diarrhea improved today; 1x large liquid BM overnigght - add PRN immodium - 6/18: LBM, Type 5; resolved     LOS: 14 days A FACE TO FACE EVALUATION WAS PERFORMED  Angelina Sheriff 04/10/2023, 8:35 AM

## 2023-04-12 ENCOUNTER — Telehealth: Payer: Self-pay

## 2023-04-12 NOTE — Telephone Encounter (Addendum)
Transitional Care Call--who you spoke with Lupita Leash (Mother)    Are you/is patient experiencing any problems since coming home? No.  Are there any questions regarding any aspect of care? No. Are there any questions regarding medications administration/dosing? Are meds being taken as prescribed? Yes. Patient should review meds with caller to confirm. Done. Have there been any falls? None Has Home Health been to the house and/or have they contacted you?Mother Lupita Leash will call for follow ups & Out-patient appointments. If not, have you tried to contact them? No. Can we help you contact them? Mother Lupita Leash will call.  Are bowels and bladder emptying properly? Yes. Are there any unexpected incontinence issues? No.  If applicable, is patient following bowel/bladder programs? N/A. Any fevers, problems with breathing, unexpected pain? No. Are there any skin problems or new areas of breakdown? No. Has the patient/family member arranged specialty MD follow up (ie cardiology/neurology/renal/surgical/etc)? Mother given discharge phone numbers.  Can we help arrange? No. Does the patient need any other services or support that we can help arrange? No,  Are caregivers following through as expected in assisting the patient? Mother is in charge and taking care of matters.         11. Has the patient quit smoking, drinking alcohol, or using drugs as recommended? Wearing Nicotine. Contact was advised about the in-take of drugs & alcohol.   Appointment  Monday July 1 with Dr. Elijah Birk arrive at 8:30 AM [address]

## 2023-04-22 ENCOUNTER — Other Ambulatory Visit: Payer: Self-pay

## 2023-04-22 ENCOUNTER — Encounter: Payer: Medicaid Other | Admitting: Physical Medicine and Rehabilitation

## 2023-04-22 ENCOUNTER — Encounter: Payer: MEDICAID | Attending: Physical Medicine and Rehabilitation | Admitting: Physical Medicine and Rehabilitation

## 2023-04-22 VITALS — BP 133/82 | HR 78 | Ht 70.0 in | Wt 201.8 lb

## 2023-04-22 DIAGNOSIS — G8191 Hemiplegia, unspecified affecting right dominant side: Secondary | ICD-10-CM | POA: Insufficient documentation

## 2023-04-22 DIAGNOSIS — I63412 Cerebral infarction due to embolism of left middle cerebral artery: Secondary | ICD-10-CM | POA: Diagnosis present

## 2023-04-22 DIAGNOSIS — K529 Noninfective gastroenteritis and colitis, unspecified: Secondary | ICD-10-CM | POA: Insufficient documentation

## 2023-04-22 DIAGNOSIS — Z8639 Personal history of other endocrine, nutritional and metabolic disease: Secondary | ICD-10-CM | POA: Insufficient documentation

## 2023-04-22 DIAGNOSIS — I639 Cerebral infarction, unspecified: Secondary | ICD-10-CM | POA: Insufficient documentation

## 2023-04-22 DIAGNOSIS — I69351 Hemiplegia and hemiparesis following cerebral infarction affecting right dominant side: Secondary | ICD-10-CM | POA: Insufficient documentation

## 2023-04-22 DIAGNOSIS — F0631 Mood disorder due to known physiological condition with depressive features: Secondary | ICD-10-CM | POA: Diagnosis present

## 2023-04-22 DIAGNOSIS — F809 Developmental disorder of speech and language, unspecified: Secondary | ICD-10-CM | POA: Diagnosis present

## 2023-04-22 DIAGNOSIS — R252 Cramp and spasm: Secondary | ICD-10-CM | POA: Insufficient documentation

## 2023-04-22 MED ORDER — MIRTAZAPINE 45 MG PO TABS: 45.0000 mg | ORAL_TABLET | Freq: Every day | ORAL | 0 refills | Status: DC

## 2023-04-22 MED ORDER — ASPIRIN 81 MG PO CHEW: 81.0000 mg | CHEWABLE_TABLET | Freq: Every day | ORAL | Status: DC

## 2023-04-22 MED ORDER — ARIPIPRAZOLE 30 MG PO TABS: 30.0000 mg | ORAL_TABLET | Freq: Every day | ORAL | 0 refills | Status: DC

## 2023-04-22 MED ORDER — ATORVASTATIN CALCIUM 40 MG PO TABS
40.0000 mg | ORAL_TABLET | Freq: Every day | ORAL | 0 refills | Status: DC
Start: 2023-04-22 — End: 2023-07-01

## 2023-04-22 MED ORDER — BENZTROPINE MESYLATE 0.5 MG PO TABS: 1.0000 mg | ORAL_TABLET | Freq: Every day | ORAL | 0 refills | Status: DC

## 2023-04-22 MED ORDER — TICAGRELOR 90 MG PO TABS: 90.0000 mg | ORAL_TABLET | Freq: Two times a day (BID) | ORAL | 0 refills | Status: DC

## 2023-04-22 MED ORDER — BACLOFEN 5 MG PO TABS: 5.0000 mg | ORAL_TABLET | Freq: Three times a day (TID) | ORAL | 2 refills | Status: DC | PRN

## 2023-04-22 MED ORDER — LOPERAMIDE HCL 2 MG PO TABS: 4.0000 mg | ORAL_TABLET | Freq: Three times a day (TID) | ORAL | 2 refills | Status: DC

## 2023-04-22 MED ORDER — ACIDOPHILUS PO CAPS: ORAL_CAPSULE | Freq: Two times a day (BID) | ORAL | 8 refills | Status: DC

## 2023-04-22 MED ORDER — DIVALPROEX SODIUM ER 250 MG PO TB24: 750.0000 mg | ORAL_TABLET | Freq: Every day | ORAL | 0 refills | Status: DC

## 2023-04-22 MED ORDER — ESCITALOPRAM OXALATE 20 MG PO TABS: 30.0000 mg | ORAL_TABLET | Freq: Every day | ORAL | 0 refills | Status: DC

## 2023-04-22 NOTE — Progress Notes (Unsigned)
Subjective:    Patient ID: Brian Rios, male    DOB: Dec 03, 1980, 42 y.o.   MRN: [id number]  HPI  Brian Rios is a 42 y.o. 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   They are presenting to PM&R clinic for follow up related to IPR admission for L MCA stroke 6/5-6/19 .    Interval Hx:  - Therapies: Not currently in therapies; he has to se ehis doctor on 7/11 and "I guess they will get him started on going then."    - Follow ups: Got a PCP Pearson Forster, who he will be seeing soon.    - Falls: None; a few near misses. Usually happens when he is tired, catches his foot, can catch himself sometimes. Has caught himself a few times but otherwise hasn't been able to. No head strikes. Last fall was yesterday, was hooking some stuff up to the truck when he fell. Does not remember where he fell but denies hurting himself.   - DME: No DME.    - Medications: He is out of Depakote today; otherwise brought in medications for review.    - Other concerns: Has had problems with diarrhea ever since his surgery at [facility] (s/p small bowel resection and hemicolectomy; saw surgery in February but didn't follow up after that. Is out of immodium). Having bowel movements 6-7x daily.   - No urinary incontinence.   - R hand pain, mostly in the palm and wrist.   Pain Inventory Average Pain 8 Pain Right Now 8 My pain is sharp  LOCATION OF PAIN  hand  BOWEL Number of stools per week: 10-15 Oral laxative use No  Type of laxative . Enema or suppository use No  History of colostomy No  Incontinent No   BLADDER Normal In and out cath, frequency . Able to self cath  . Bladder incontinence No  Frequent urination No  Leakage with coughing No  Difficulty starting stream No  Incomplete  bladder emptying No    Mobility walk without assistance ability to climb steps?  yes do you drive?  no  Function not employed: date last employed . I need assistance with the following:  meal prep  Neuro/Psych bowel control problems weakness numbness confusion depression  Prior Studies TC appt  Physicians involved in your care TC appt   Family History  Problem Relation Age of Onset   Alcoholism Mother    Hypertension Mother    Hyperlipidemia Mother    Breast cancer Mother    Alcoholism Father    Diabetes Maternal Grandmother    Social History   Socioeconomic History   Marital status: Single    Spouse name: Not on file   Number of children: Not on file   Years of education: Not on file   Highest education level: Not on file  Occupational History   Not on file  Tobacco Use   Smoking status: Every Day    Packs/day: 1.00    Years: 15.00    Additional pack years: 0.00    Total pack years: 15.00    Types: Cigarettes    Passive exposure: Past   Smokeless tobacco: Former    Quit date: 03/09/2003  Vaping Use   Vaping Use: Never used  Substance and Sexual Activity   Alcohol use: Yes  Alcohol/week: 6.0 standard drinks of alcohol    Types: 6 Cans of beer per week   Drug use: Yes    Frequency: 7.0 times per week    Types: "Crack" cocaine, Marijuana    Comment: Marijuana daily. USED 6 MONTHS AGO CRACK (2023)   Sexual activity: Yes    Birth control/protection: None  Other Topics Concern   Not on file  Social History Narrative   Lives with mother   Social Determinants of Health   Financial Resource Strain: Not on file  Food Insecurity: Food Insecurity Present (06/12/2022)   Hunger Vital Sign    Worried About Running Out of Food in the Last Year: Often true    Ran Out of Food in the Last Year: Often true  Transportation Needs: Unmet Transportation Needs (06/12/2022)   PRAPARE - Administrator, Civil Service (Medical): No    Lack of  Transportation (Non-Medical): Yes  Physical Activity: Not on file  Stress: Not on file  Social Connections: Not on file   Past Surgical History:  Procedure Laterality Date   BOWEL RESECTION N/A 09/04/2022   Procedure: SMALL BOWEL RESECTION, open;  Surgeon: Leafy Ro, MD;  Location: [facility] ORS;  Service: General;  Laterality: N/A;   IR CT HEAD LTD  03/21/2023   IR CT HEAD LTD  03/21/2023   IR PERCUTANEOUS ART THROMBECTOMY/INFUSION INTRACRANIAL INC DIAG ANGIO  03/21/2023   ORIF ORBITAL FRACTURE  1985   PARTIAL COLECTOMY Right 09/04/2022   Procedure: PARTIAL COLECTOMY;  Surgeon: Leafy Ro, MD;  Location: [facility] ORS;  Service: General;  Laterality: Right;  Right colectimy   RADIOLOGY WITH ANESTHESIA N/A 03/21/2023   Procedure: RADIOLOGY WITH ANESTHESIA;  Surgeon: Radiologist, Medication, MD;  Location: [facility] OR;  Service: Radiology;  Laterality: N/A;   TEE WITHOUT CARDIOVERSION N/A 07/19/2020   Procedure: TRANSESOPHAGEAL ECHOCARDIOGRAM (TEE);  Surgeon: Lamar Blinks, MD;  Location: [facility] ORS;  Service: Cardiovascular;  Laterality: N/A;   WRIST SURGERY Left 11/07/2013   REPAIR ULNAR ARTERY, NERVE AND TENDON   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   left cerebrum   Erectile dysfunction    Heart murmur    Hyperlipidemia    Schizophrenia (HCC)    Tobacco abuse    BP 133/82   Pulse 78   Ht 5\' 10"  (1.778 m)   Wt 201 lb 12.8 oz (91.5 kg)   SpO2 96%   BMI 28.96 kg/m   Opioid Risk Score:   Fall Risk Score:  `1  Depression screen [facility] 2/9     04/22/2023   10:56 AM 06/12/2022   12:29 PM  Depression screen PHQ 2/9  Decreased Interest 0 2  Down, Depressed, Hopeless 3 3  PHQ - 2 Score 3 5  Altered sleeping 0 3  Tired, decreased energy 3 3  Change in appetite 0 3  Feeling bad or failure about yourself  0 3  Trouble concentrating 3 0  Moving slowly or fidgety/restless 3 2   Suicidal thoughts 0 3  PHQ-9 Score 12 22  Difficult doing work/chores Somewhat difficult      Review of Systems  Musculoskeletal:        Hand pain  Neurological:  Positive for weakness and numbness.  Psychiatric/Behavioral:  Positive for confusion and dysphoric mood. The patient is not nervous/anxious.   All other  systems reviewed and are negative.     Objective:   Physical Exam    PE: Constitution: Appropriate appearance for age. No apparent distress *** +Obese Resp: No respiratory distress. No accessory muscle usage. {Blank multiple:19196::"***","on RA","CTAB","on *** L Esko"} Cardio: Well perfused appearance. *** No peripheral edema. Abdomen: Nondistended. Nontender.   Psych: Appropriate mood and affect. Neuro: AAOx4. No apparent cognitive deficits   Neurologic Exam:   DTRs: Reflexes were 2+ in bilateral achilles, patella, biceps, BR and triceps. Babinsky: flexor responses b/l.   Hoffmans: negative b/l Sensory exam: revealed normal sensation in all dermatomal regions in {Blank multiple:19196::"***","bilateral upper extremities","bilateral lower extremities","right upper extremity","left upper extremity","right lower extremity","left lower extremity","with reduced sensation to light touch in ***"} Motor exam: strength 5/5 throughout {Blank multiple:19196::"***","bilateral upper extremities","bilateral lower extremities","right upper extremity","left upper extremity","right lower extremity","left lower extremity","with exception of ***"} Coordination: Fine motor coordination was normal.   Gait: {Blank single:19197::"not observed due to safety concerns","normal","+trendelenberg","+antalgic gait","+foot drop","***"}        Assessment & Plan:   Dcarlos Ribeiro is a 42 y.o. 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   They are presenting to PM&R clinic as a new patient for treatment of *** . They were referred by *** . Based on their presentation, *** .  Cerebrovascular accident (CVA) due to embolism of left middle cerebral artery (HCC) Continue to take 1 over-the-counter aspirin 81 mg daily and Brilinta 900 mg twice daily for stroke prevention; I will refill your Brilinta today  Follow-up with [facility] Dr. Pearlean Brownie for neurology; you will need to call their office to make this appointment  Follow-up as instructed with interventional radiology  Follow-up with me in 3 months; we can do this via telehealth if desired.  Right hemiparesis (HCC) Speech and language deficits I will discuss with social work regarding what happened to referrals for PT, OT, and speech therapy.  Please call our office and let me know if you end up getting new referrals with your primary care doctor for these therapies.  Continue to take your time and be careful when moving, twisting, or doing sudden movements to prevent falls.   Spasticity You are starting to develop some tightness in your right hand and wrist.  I will give you information today on things to look out for moving forward that would constitute an increase in tone that may require injections for treatment.  In the meantime, I am starting you on baclofen 5 mg 3 times daily as needed for pain and tightness in your wrist.   Chronic diarrhea You are overdue for follow up with [facility], [facility], [facility].  Please call their office and schedule follow-up.  I am sending a new prescription for your prior medication of Imodium 4 mg 3 times daily.  Also continue daily Metamucil and high-fiber diet.   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  I am also refilling your Depakote.  Please have your primary care doctor take over these  medications on follow-up.  Please call our office or let me know if you have any side effects to the increased Lexapro.

## 2023-04-22 NOTE — Patient Instructions (Addendum)
  Cerebrovascular accident (CVA) due to embolism of left middle cerebral artery (HCC) Continue to take 1 over-the-counter aspirin 81 mg daily and Brilinta 900 mg twice daily for stroke prevention; I will refill your Brilinta today  Follow-up with [facility] Dr. Pearlean Brownie for neurology; you will need to call their office to make this appointment [facility] [phone], [address] [place], [place] [address] [phone]  Follow-up as instructed with interventional radiology  Follow-up with me in 3 months; we can do this via telehealth if desired.  Right hemiparesis (HCC) Speech and language deficits I will discuss with social work regarding what happened to referrals for PT, OT, and speech therapy.  Please call our office and let me know if you end up getting new referrals with your primary care doctor for these therapies.  Continue to take your time and be careful when moving, twisting, or doing sudden movements to prevent falls.   Spasticity You are starting to develop some tightness in your right hand and wrist.  I will give you information today on things to look out for moving forward that would constitute an increase in tone that may require injections for treatment.  In the meantime, I am starting you on baclofen 5 mg 3 times daily as needed for pain and tightness in your wrist.   Chronic diarrhea You are overdue for follow up with [facility], [facility].  Please call their office and schedule follow-up.  I am sending a new prescription for your prior medication of Imodium 4 mg 3 times daily.  Also continue daily Metamucil and high-fiber diet.  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  I am also  refilling your Depakote.  Please have your primary care doctor take over these medications on follow-up.  Please call our office or let me know if you have any side effects to the increased Lexapro.

## 2023-04-23 ENCOUNTER — Telehealth: Payer: Self-pay | Admitting: Physical Medicine and Rehabilitation

## 2023-04-23 NOTE — Telephone Encounter (Signed)
Patient's mother asking if MD can call her she was several questions.

## 2023-04-23 NOTE — Telephone Encounter (Signed)
Mr. Shakoor Mother called to ask if Mr. Hornick could be put in a home, somewhere? He is drinking, smoking and not telling the truth. He is out of control at home and she cannot take care of him. He will not listen to anyone.   Call back phone [phone].   (The Mother stated she does not think she is in any danger).  She would like to speak to Dr. Shearon Stalls. Caller has been informed Dr. Shearon Stalls is not in the office today. However a request will be sent.

## 2023-04-24 MED ORDER — DIVALPROEX SODIUM ER 250 MG PO TB24: 750.0000 mg | ORAL_TABLET | Freq: Every day | ORAL | 0 refills | Status: DC

## 2023-04-24 NOTE — Telephone Encounter (Signed)
Called mom and left voicemail stating patient has decision making capacity and if he is not a danger to himself or others (having active hallucinations, delusions, or suicidal) he can make his own decision and no one can force him into a home. I told her to call me back if any questions; if not I will try to get back to her by early next week.

## 2023-04-26 ENCOUNTER — Other Ambulatory Visit: Payer: Self-pay

## 2023-04-30 ENCOUNTER — Other Ambulatory Visit: Payer: Self-pay

## 2023-05-02 ENCOUNTER — Other Ambulatory Visit: Payer: Self-pay

## 2023-05-02 ENCOUNTER — Ambulatory Visit: Payer: MEDICAID | Admitting: Physician Assistant

## 2023-05-02 ENCOUNTER — Encounter: Payer: Self-pay | Admitting: Physician Assistant

## 2023-05-02 VITALS — BP 104/78 | HR 70 | Temp 97.9°F | Ht 70.0 in | Wt 204.0 lb

## 2023-05-02 DIAGNOSIS — F209 Schizophrenia, unspecified: Secondary | ICD-10-CM | POA: Diagnosis not present

## 2023-05-02 DIAGNOSIS — F809 Developmental disorder of speech and language, unspecified: Secondary | ICD-10-CM

## 2023-05-02 DIAGNOSIS — F1590 Other stimulant use, unspecified, uncomplicated: Secondary | ICD-10-CM

## 2023-05-02 DIAGNOSIS — I69351 Hemiplegia and hemiparesis following cerebral infarction affecting right dominant side: Secondary | ICD-10-CM | POA: Diagnosis not present

## 2023-05-02 DIAGNOSIS — E785 Hyperlipidemia, unspecified: Secondary | ICD-10-CM

## 2023-05-02 DIAGNOSIS — Z9049 Acquired absence of other specified parts of digestive tract: Secondary | ICD-10-CM

## 2023-05-02 DIAGNOSIS — F332 Major depressive disorder, recurrent severe without psychotic features: Secondary | ICD-10-CM

## 2023-05-02 DIAGNOSIS — Z8673 Personal history of transient ischemic attack (TIA), and cerebral infarction without residual deficits: Secondary | ICD-10-CM

## 2023-05-02 DIAGNOSIS — F17200 Nicotine dependence, unspecified, uncomplicated: Secondary | ICD-10-CM

## 2023-05-02 DIAGNOSIS — H538 Other visual disturbances: Secondary | ICD-10-CM

## 2023-05-02 DIAGNOSIS — R479 Unspecified speech disturbances: Secondary | ICD-10-CM | POA: Insufficient documentation

## 2023-05-02 DIAGNOSIS — F1910 Other psychoactive substance abuse, uncomplicated: Secondary | ICD-10-CM

## 2023-05-02 NOTE — Progress Notes (Signed)
Date:  05/02/2023   Name:  Brian Rios   DOB:  May 08, 1981   MRN:  [id number]   Chief Complaint: Establish Care and Speech Problem (Pt needs speech therapy due to having a stroke)  HPI Brian Rios is a pleasant 42 year old with complex medical history joined by his mother today new to our practice to establish care.  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  On 03/21/2023 he presented to the ED with speech difficulty and right-sided weakness, found to have left MCA stroke caused by reocclusion, suspected cocaine use contributory.  UDS was positive for cocaine and THC.  Unfortunately he continues to smoke cigarettes, he is not using nicotine patches interested in quitting.  His mother reports he has not used cocaine in years...  Needs referrals for PT, OT, speech, neuro, psych. [place] preferred.   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  Last visit with general surgery 11/22/2022 to evaluate persistent diarrhea and prescribed Imodium with follow-up as needed.  Complains of blurry vision, uses glasses, congenital amblyopia, needs eye exam.  Last exam was 2 years ago.  Medication list has been reviewed and updated.  Current Meds  Medication Sig   ABILIFY ASIMTUFII 960 MG/3.2ML PRSY Inject into the muscle.   acetaminophen (TYLENOL) 325 MG tablet Take 2 tablets (650 mg total) by mouth every 4 (four) hours as needed for mild pain (or temp > 37.5 C (99.5 F)).   ARIPiprazole (ABILIFY) 30 MG tablet Take 1 tablet (30 mg total) by mouth at bedtime.   aspirin 81 MG chewable tablet Chew 1 tablet (81 mg total) by mouth daily.   atorvastatin (LIPITOR) 40  MG tablet Take 1 tablet (40 mg total) by mouth daily.   benztropine (COGENTIN) 0.5 MG tablet Take 2 tablets (1 mg total) by mouth at bedtime.   divalproex (DEPAKOTE ER) 250 MG 24 hr tablet Take 3 tablets (750 mg total) by mouth at bedtime.   escitalopram (LEXAPRO) 20 MG tablet Take 1.5 tablets (30 mg total) by mouth daily.   Lactobacillus (ACIDOPHILUS PO) Take by mouth in the morning and at bedtime.   loperamide (IMODIUM A-D) 2 MG tablet Take 2 tablets (4 mg total) by mouth 3 (three) times daily.   mirtazapine (REMERON) 45 MG tablet Take 1 tablet (45 mg total) by mouth at bedtime.   nicotine (NICODERM CQ - DOSED IN MG/24 HOURS) 21 mg/24hr patch 21 mg patch daily x 1 week then 14 mg patch daily x 3 weeks then 7 mg patch daily x 3 weeks and stop   nicotine (NICODERM CQ - DOSED IN MG/24 HR) 7 mg/24hr patch After 1 week of 21mg  patch, 3 weeks of 14mg  patches, Apply 7 mg patch daily x 3 weeks and stop   ticagrelor (BRILINTA) 90 MG TABS tablet Take 1 tablet (90 mg total) by mouth 2 (two) times daily.     Review of Systems  Constitutional:  Negative for fatigue and fever.  Respiratory:  Negative for chest tightness and shortness of breath.   Cardiovascular:  Negative for chest pain and palpitations.  Gastrointestinal:  Negative for abdominal pain.  Neurological:  Positive for speech difficulty and weakness.  Psychiatric/Behavioral:  Positive for dysphoric mood. Negative for suicidal  ideas. The patient is nervous/anxious.     Patient Active Problem List   Diagnosis Date Noted   Speech and language deficits 04/22/2023   Hemiparesis affecting right side as late effect of cerebrovascular accident (CVA) (HCC) 04/22/2023   Spasticity 04/22/2023   Chronic diarrhea 04/22/2023   Chronic schizophrenia (HCC) 04/01/2023   S/P right colectomy 09/04/2022   Dental decay 06/12/2022   Erectile dysfunction 07/26/2021   Hyperlipidemia LDL goal <70 03/28/2021   History of embolic stroke 07/18/2020   Drug  abuse (HCC) 07/17/2020   Major depressive disorder, recurrent episode, severe (HCC) 02/20/2015   Alcohol use disorder, severe, dependence (HCC) 02/20/2015   Stimulant use disorder 02/20/2015   Tobacco use disorder 02/20/2015    No Known Allergies  Immunization History  Administered Date(s) Administered   Tdap 11/07/2013, 04/05/2016    Past Surgical History:  Procedure Laterality Date   BOWEL RESECTION N/A 09/04/2022   Procedure: SMALL BOWEL RESECTION, open;  Surgeon: Leafy Ro, MD;  Location: [facility]S;  Service: General;  Laterality: N/A;   IR CT HEAD LTD  03/21/2023   IR CT HEAD LTD  03/21/2023   IR PERCUTANEOUS ART THROMBECTOMY/INFUSION INTRACRANIAL INC DIAG ANGIO  03/21/2023   ORIF ORBITAL FRACTURE  1985   PARTIAL COLECTOMY Right 09/04/2022   Procedure: PARTIAL COLECTOMY;  Surgeon: Leafy Ro, MD;  Location: [facility];  Service: General;  Laterality: Right;  Right colectimy   RADIOLOGY WITH ANESTHESIA N/A 03/21/2023   Procedure: RADIOLOGY WITH ANESTHESIA;  Surgeon: Radiologist, Medication, MD;  [facility];  Service: Radiology;  Laterality: N/A;   TEE WITHOUT CARDIOVERSION N/A 07/19/2020   Procedure: TRANSESOPHAGEAL ECHOCARDIOGRAM (TEE);  Surgeon: Lamar Blinks, MD;  Location: [facility];  Service: Cardiovascular;  Laterality: N/A;   WRIST SURGERY Left 11/07/2013   REPAIR ULNAR ARTERY, NERVE AND TENDON    Social History   Tobacco Use   Smoking status: Every Day    Current packs/day: 1.00    Average packs/day: 1 pack/day for 15.0 years (15.0 ttl pk-yrs)    Types: Cigarettes    Passive exposure: Past   Smokeless tobacco: Never  Vaping Use   Vaping status: Never Used  Substance Use Topics   Alcohol use: Yes    Alcohol/week: 6.0 standard drinks of alcohol    Types: 6 Cans of beer per week   Drug use: Yes    Frequency: 7.0 times per week    Types: "Crack" cocaine, Marijuana    Comment: Marijuana daily. USED 6 MONTHS AGO CRACK (2023)    Family History   Problem Relation Age of Onset   Alcoholism Mother    Hypertension Mother    Breast cancer Mother    Alcoholism Father    Stroke Maternal Grandmother    Hypertension Maternal Grandmother    Diabetes Maternal Grandmother         05/02/2023   10:03 AM  GAD 7 : Generalized Anxiety Score  Nervous, Anxious, on Edge 2  Control/stop worrying 3  Worry too much - different things 3  Trouble relaxing 3  Restless 3  Easily annoyed or irritable 3  Afraid - awful might happen 0  Total GAD 7 Score 17  Anxiety Difficulty Extremely difficult       05/02/2023   10:02 AM 04/22/2023   10:56 AM 06/12/2022   12:29 PM  Depression screen PHQ 2/9  Decreased Interest 0 0 2  Down, Depressed, Hopeless 0 3 3  PHQ - 2 Score 0 3  5  Altered sleeping 0 0 3  Tired, decreased energy 0 3 3  Change in appetite 3 0 3  Feeling bad or failure about yourself  0 0 3  Trouble concentrating 3 3 0  Moving slowly or fidgety/restless 3 3 2   Suicidal thoughts 0 0 3  PHQ-9 Score 9 12 22   Difficult doing work/chores Extremely dIfficult Somewhat difficult     BP Readings from Last 3 Encounters:  05/02/23 104/78  04/22/23 133/82  04/10/23 114/74    Wt Readings from Last 3 Encounters:  05/02/23 204 lb (92.5 kg)  04/22/23 201 lb 12.8 oz (91.5 kg)  04/03/23 201 lb 15.1 oz (91.6 kg)    BP 104/78   Pulse 70   Temp 97.9 F (36.6 C) (Oral)   Ht 5\' 10"  (1.778 m)   Wt 204 lb (92.5 kg)   SpO2 97%   BMI 29.27 kg/m   Physical Exam Vitals and nursing note reviewed.  Constitutional:      Appearance: Normal appearance.  Eyes:     Comments: EOM preserved  Neck:     Vascular: No carotid bruit.  Cardiovascular:     Rate and Rhythm: Normal rate and regular rhythm.     Heart sounds: No murmur heard.    No friction rub. No gallop.  Pulmonary:     Effort: Pulmonary effort is normal.     Breath sounds: Normal breath sounds.  Abdominal:     General: There is no distension.  Musculoskeletal:        General:  Normal range of motion.  Skin:    General: Skin is warm and dry.  Neurological:     Mental Status: He is alert and oriented to person, place, and time.     Gait: Gait is intact.     Comments: Strength 4/5 RLE and RUE, weakness of grip RIGHT hand. Strength 5/5 LUE and LLE. Gait intact without assistance. Some speech difficulty apparent, but patient is mostly communicative and easy to understand.   Psychiatric:        Mood and Affect: Mood and affect normal.     Recent Labs     Component Value Date/Time   NA 137 03/28/2023 1038   NA 138 07/18/2022 0951   NA 137 02/14/2015 1915   K 3.9 03/28/2023 1038   K 3.7 02/14/2015 1915   CL 104 03/28/2023 1038   CL 107 02/14/2015 1915   CO2 23 03/28/2023 1038   CO2 21 (L) 02/14/2015 1915   GLUCOSE 96 03/28/2023 1038   GLUCOSE 85 02/14/2015 1915   BUN 7 03/28/2023 1038   BUN 12 07/18/2022 0951   BUN 12 02/14/2015 1915   CREATININE 0.96 03/28/2023 1038   CREATININE 0.88 02/14/2015 1915   CALCIUM 9.0 03/28/2023 1038   CALCIUM 9.3 02/14/2015 1915   PROT 6.8 03/28/2023 1038   PROT 5.9 (L) 07/18/2022 0951   PROT 8.3 (H) 02/14/2015 1915   ALBUMIN 2.8 (L) 03/28/2023 1038   ALBUMIN 3.9 (L) 07/18/2022 0951   ALBUMIN 4.7 02/14/2015 1915   AST 33 03/28/2023 1038   AST 25 02/14/2015 1915   ALT 34 03/28/2023 1038   ALT 32 02/14/2015 1915   ALKPHOS 59 03/28/2023 1038   ALKPHOS 62 02/14/2015 1915   BILITOT 0.6 03/28/2023 1038   BILITOT <0.2 07/18/2022 0951   BILITOT 0.3 02/14/2015 1915   GFRNONAA >60 03/28/2023 1038   GFRNONAA >60 02/14/2015 1915   GFRAA >60 07/17/2020 1338   GFRAA >60  02/14/2015 1915    Lab Results  Component Value Date   WBC 12.6 (H) 03/28/2023   HGB 13.7 03/28/2023   HCT 41.8 03/28/2023   MCV 83.4 03/28/2023   PLT 329 03/28/2023   Lab Results  Component Value Date   HGBA1C 5.6 03/23/2023   Lab Results  Component Value Date   CHOL 113 03/22/2023   HDL 37 (L) 03/22/2023   LDLCALC 54 03/22/2023   TRIG 109  03/22/2023   CHOLHDL 3.1 03/22/2023   Lab Results  Component Value Date   TSH 4.040 03/28/2021     Assessment and Plan:  1. Hemiparesis affecting right side as late effect of cerebrovascular accident (CVA) (HCC) Resubmitting referral today for PT, OT, speech, and neuro.  Patient and mother state that [place] far.  We will try to arrange for the [place] area - Ambulatory referral to Speech Therapy - Ambulatory referral to Physical Therapy - Ambulatory referral to Occupational Therapy - Ambulatory referral to Neurology  2. History of embolic stroke Resubmitting referral today for PT, OT, speech, and neuro.  Patient and mother state that [place] far.  We will try to arrange for the [place] area - Ambulatory referral to Speech Therapy - Ambulatory referral to Physical Therapy - Ambulatory referral to Occupational Therapy - Ambulatory referral to Neurology  3. Speech and language deficits Resubmitting referral today for PT, OT, speech, and neuro.  Patient and mother state that [place] far.  We will try to arrange for the [place] area - Ambulatory referral to Speech Therapy  4. Severe episode of recurrent major depressive disorder, without psychotic features (HCC) Continue current medications, referring to psychiatry.  Patient made aware that I can refill his psych meds if needed. - Ambulatory referral to Psychiatry  5. Chronic schizophrenia (HCC) Continue current medications, referring to psychiatry.  Patient made aware that I can refill his psych meds if needed. - Ambulatory referral to Psychiatry  6. Drug abuse (HCC) I suspect patient is not entirely forthcoming about drug use, especially with his mother in the room.  Next visit I intend to carefully review drug history one-on-one with patient.  Checking UDS today.   We discussed likely contribution of drug use to his history of 2 strokes, specifically when it comes to  tobacco and cocaine use.  Ideally patient would strive to stop all harmful substances  - Ambulatory referral to Psychiatry  7. Stimulant use disorder Plan as above - Drug Screen, Urine - Ambulatory referral to Psychiatry  8. Tobacco use disorder Patient expresses no interest in quitting at this time.  Tobacco sensation would be hugely beneficial for his health. - Ambulatory referral to Psychiatry  9. Hyperlipidemia LDL goal <70 Continue statin, last lipids at goal  10. S/P right colectomy Seems mostly recovered from this, dismissed from general surgery with follow-up as needed.  Continue Imodium as needed for diarrhea.  11. Blurry vision Advised patient to call different optometrists in [place] to see if he has any insurance coverage for routine eye exam. No need for referral from me.    Return in about 4 weeks (around 05/30/2023) for OV chronic conditions.    Alvester Morin, PA-C, DMSc, Nutritionist [facility] [phone]

## 2023-05-03 LAB — DRUG SCREEN, URINE
Amphetamines, Urine: NEGATIVE ng/mL
Barbiturate screen, urine: NEGATIVE ng/mL
Benzodiazepine Quant, Ur: NEGATIVE ng/mL
Cannabinoid Quant, Ur: POSITIVE ng/mL — AB
Cocaine (Metab.): NEGATIVE ng/mL
Opiate Quant, Ur: NEGATIVE ng/mL
PCP Quant, Ur: NEGATIVE ng/mL

## 2023-05-07 ENCOUNTER — Ambulatory Visit: Payer: MEDICAID | Attending: Physician Assistant | Admitting: Speech Pathology

## 2023-05-07 ENCOUNTER — Ambulatory Visit: Payer: MEDICAID | Admitting: Occupational Therapy

## 2023-05-07 ENCOUNTER — Ambulatory Visit: Payer: MEDICAID | Admitting: Physical Therapy

## 2023-05-07 NOTE — Therapy (Deleted)
OUTPATIENT PHYSICAL THERAPY NEURO EVALUATION   Patient Name: Brian Rios MRN: [id number] DOB:12/08/1980, 42 y.o., male Today's Date: 05/07/2023   PCP:   Remo Lipps, PA   REFERRING PROVIDER:   Remo Lipps, PA    END OF SESSION:   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   left cerebrum   Erectile dysfunction    Heart murmur    Hyperlipidemia    Schizophrenia (HCC)    Tobacco abuse    Past Surgical History:  Procedure Laterality Date   BOWEL RESECTION N/A 09/04/2022   Procedure: SMALL BOWEL RESECTION, open;  Surgeon: Leafy Ro, MD;  Location: [facility];  Service: General;  Laterality: N/A;   IR CT HEAD LTD  03/21/2023   IR CT HEAD LTD  03/21/2023   IR PERCUTANEOUS ART THROMBECTOMY/INFUSION INTRACRANIAL INC DIAG ANGIO  03/21/2023   ORIF ORBITAL FRACTURE  1985   PARTIAL COLECTOMY Right 09/04/2022   Procedure: PARTIAL COLECTOMY;  Surgeon: Leafy Ro, MD;  Location: [facility] OR[facility];  Service: General;  Laterality: Right;  Right colectimy   RADIOLOGY WITH ANESTHESIA N/A 03/21/2023   Procedure: RADIOLOGY WITH ANESTHESIA;  Surgeon: Radiologist, Medication, MD;  Location: [facility];  Service: Radiology;  Laterality: N/A;   TEE WITHOUT CARDIOVERSION N/A 07/19/2020   Procedure: TRANSESOPHAGEAL ECHOCARDIOGRAM (TEE);  Surgeon: Lamar Blinks, MD;  Location: [facility] OR[facility];  Service: Cardiovascular;  Laterality: N/A;   WRIST SURGERY Left 11/07/2013   REPAIR ULNAR ARTERY, NERVE AND TENDON   Patient Active Problem List   Diagnosis Date Noted   Speech and language deficits 04/22/2023   Hemiparesis affecting right side as late effect of cerebrovascular accident (CVA) (HCC) 04/22/2023   Spasticity 04/22/2023   Chronic diarrhea 04/22/2023   Chronic schizophrenia (HCC) 04/01/2023   S/P right colectomy 09/04/2022   Dental decay 06/12/2022   Erectile dysfunction  07/26/2021   Hyperlipidemia LDL goal <70 03/28/2021   History of embolic stroke 07/18/2020   Drug abuse (HCC) 07/17/2020   Major depressive disorder, recurrent episode, severe (HCC) 02/20/2015   Alcohol use disorder, severe, dependence (HCC) 02/20/2015   Stimulant use disorder 02/20/2015   Tobacco use disorder 02/20/2015    ONSET DATE: 03/21/2023  REFERRING DIAG: ***  THERAPY DIAG:  No diagnosis found.  Rationale for Evaluation and Treatment: {HABREHAB:27488}  SUBJECTIVE:  SUBJECTIVE STATEMENT: *** Pt accompanied by: {accompnied:27141}  PERTINENT HISTORY: ***  PAIN:  Are you having pain? {OPRCPAIN:27236}  PRECAUTIONS: {Therapy precautions:24002}  RED FLAGS: {PT Red Flags:29287}   WEIGHT BEARING RESTRICTIONS: {Yes ***/No:24003}  FALLS: Has patient fallen in last 6 months? {fallsyesno:27318}  LIVING ENVIRONMENT: Lives with: {OPRC lives with:25569::"lives with their family"} Lives in: {Lives in:25570} Stairs: {opstairs:27293} Has following equipment at home: {Assistive devices:23999}  PLOF: {PLOF:24004}  PATIENT GOALS: ***  OBJECTIVE:   DIAGNOSTIC FINDINGS: ***  COGNITION: Overall cognitive status: {cognition:24006}   SENSATION: {sensation:27233}  COORDINATION: ***  EDEMA:  {edema:24020}  MUSCLE TONE: {LE tone:25568}  MUSCLE LENGTH: Hamstrings: Right *** deg; Left *** deg Thomas test: Right *** deg; Left *** deg  DTRs:  {DTR SITE:24025}  POSTURE: {posture:25561}  LOWER EXTREMITY ROM:     {AROM/PROM:27142}  Right Eval Left Eval  Hip flexion    Hip extension    Hip abduction    Hip adduction    Hip internal rotation    Hip external rotation    Knee flexion    Knee extension    Ankle dorsiflexion    Ankle plantarflexion    Ankle inversion    Ankle  eversion     (Blank rows = not tested)  LOWER EXTREMITY MMT:    MMT Right Eval Left Eval  Hip flexion    Hip extension    Hip abduction    Hip adduction    Hip internal rotation    Hip external rotation    Knee flexion    Knee extension    Ankle dorsiflexion    Ankle plantarflexion    Ankle inversion    Ankle eversion    (Blank rows = not tested)  BED MOBILITY:  {Bed mobility:24027}  TRANSFERS: Assistive device utilized: {Assistive devices:23999}  Sit to stand: {Levels of assistance:24026} Stand to sit: {Levels of assistance:24026} Chair to chair: {Levels of assistance:24026} Floor: {Levels of assistance:24026}  RAMP:  Level of Assistance: {Levels of assistance:24026} Assistive device utilized: {Assistive devices:23999} Ramp Comments: ***  CURB:  Level of Assistance: {Levels of assistance:24026} Assistive device utilized: {Assistive devices:23999} Curb Comments: ***  STAIRS: Level of Assistance: {Levels of assistance:24026} Stair Negotiation Technique: {Stair Technique:27161} with {Rail Assistance:27162} Number of Stairs: ***  Height of Stairs: ***  Comments: ***  GAIT: Gait pattern: {gait characteristics:25376} Distance walked: *** Assistive device utilized: {Assistive devices:23999} Level of assistance: {Levels of assistance:24026} Comments: ***  FUNCTIONAL TESTS:  {Functional tests:24029}  PATIENT SURVEYS:  {rehab surveys:24030}  TODAY'S TREATMENT:                                                                                                                              DATE: ***    PATIENT EDUCATION: Education details: *** Person educated: {Person educated:25204} Education method: {Education Method:25205} Education comprehension: {Education Comprehension:25206}  HOME EXERCISE PROGRAM: ***  GOALS: Goals reviewed with patient? {yes/no:20286}  SHORT TERM GOALS: Target date: ***  *** Baseline:  Goal status: INITIAL  2.   *** Baseline:  Goal status: INITIAL  3.  *** Baseline:  Goal status: INITIAL  4.  *** Baseline:  Goal status: INITIAL  5.  *** Baseline:  Goal status: INITIAL  6.  *** Baseline:  Goal status: INITIAL  LONG TERM GOALS: Target date: ***  *** Baseline:  Goal status: INITIAL  2.  *** Baseline:  Goal status: INITIAL  3.  *** Baseline:  Goal status: INITIAL  4.  *** Baseline:  Goal status: INITIAL  5.  *** Baseline:  Goal status: INITIAL  6.  *** Baseline:  Goal status: INITIAL  ASSESSMENT:  CLINICAL IMPRESSION: Patient is a *** y.o. *** who was seen today for physical therapy evaluation and treatment for ***.   OBJECTIVE IMPAIRMENTS: {opptimpairments:25111}.   ACTIVITY LIMITATIONS: {activitylimitations:27494}  PARTICIPATION LIMITATIONS: {participationrestrictions:25113}  PERSONAL FACTORS: {Personal factors:25162} are also affecting patient's functional outcome.   REHAB POTENTIAL: {rehabpotential:25112}  CLINICAL DECISION MAKING: {clinical decision making:25114}  EVALUATION COMPLEXITY: {Evaluation complexity:25115}  PLAN:  PT FREQUENCY: {rehab frequency:25116}  PT DURATION: {rehab duration:25117}  PLANNED INTERVENTIONS: {rehab planned interventions:25118::"Therapeutic exercises","Therapeutic activity","Neuromuscular re-education","Balance training","Gait training","Patient/Family education","Self Care","Joint mobilization"}  PLAN FOR NEXT SESSION: ***   Golden Pop, PT 05/07/2023, 3:24 PM

## 2023-05-13 ENCOUNTER — Other Ambulatory Visit: Payer: Self-pay | Admitting: Physician Assistant

## 2023-05-13 NOTE — Telephone Encounter (Signed)
Medication Refill - Medication: ticagrelor (BRILINTA) 90 MG TABS tablet   Has the patient contacted their pharmacy? No.  Preferred Pharmacy (with phone number or street name):  [facility] [place], [place] - [address], [place] [address] Phone: [phone]  Fax: [phone]     Has the patient been seen for an appointment in the last year OR does the patient have an upcoming appointment? No.  Agent: Please be advised that RX refills may take up to 3 business days. We ask that you follow-up with your pharmacy.

## 2023-05-14 NOTE — Telephone Encounter (Signed)
Requested medications are due for refill today.  A little soon  Requested medications are on the active medications list.  yes  Last refill. 04/22/2023 #[phone] 0 rf  Future visit scheduled.   yes  Notes to clinic.  Rx signed by Elijah Birk.    Requested Prescriptions  Pending Prescriptions Disp Refills   ticagrelor (BRILINTA) 90 MG TABS tablet 60 tablet 0    Sig: Take 1 tablet (90 mg total) by mouth 2 (two) times daily.     Hematology: Antiplatelets - ticagrelor Passed - 05/13/2023 10:10 AM      Passed - Cr in normal range and within 180 days    Creatinine  Date Value Ref Range Status  02/14/2015 0.88 mg/dL Final    Comment:    9.56-3.87 NOTE: New Reference Range  12/28/14    Creatinine, Ser  Date Value Ref Range Status  03/28/2023 0.96 0.61 - 1.24 mg/dL Final         Passed - HCT in normal range and within 180 days    HCT  Date Value Ref Range Status  03/28/2023 41.8 39.0 - 52.0 % Final   Hematocrit  Date Value Ref Range Status  07/18/2022 42.8 37.5 - 51.0 % Final         Passed - HGB in normal range and within 180 days    Hemoglobin  Date Value Ref Range Status  03/28/2023 13.7 13.0 - 17.0 g/dL Final  56/43/3295 18.8 13.0 - 17.7 g/dL Final         Passed - PLT in normal range and within 180 days    Platelets  Date Value Ref Range Status  03/28/2023 329 150 - 400 K/uL Final  07/18/2022 229 150 - 450 x10E3/uL Final         Passed - Last Heart Rate in normal range    Pulse Readings from Last 1 Encounters:  05/02/23 70         Passed - Valid encounter within last 6 months    Recent Outpatient Visits           1 week ago Hemiparesis affecting right side as late effect of cerebrovascular accident (CVA) (HCC)   [place] [facility] at [facility], Melton Alar, PA       Future Appointments             In 2 weeks Mordecai Maes, Melton Alar, PA [facility] at [facility], [facility]

## 2023-05-15 MED ORDER — TICAGRELOR 90 MG PO TABS: 90.0000 mg | ORAL_TABLET | Freq: Two times a day (BID) | ORAL | 1 refills | Status: DC

## 2023-05-15 NOTE — Telephone Encounter (Signed)
Please review.  KP

## 2023-05-16 ENCOUNTER — Ambulatory Visit: Payer: MEDICAID

## 2023-05-16 ENCOUNTER — Ambulatory Visit: Payer: MEDICAID | Admitting: Occupational Therapy

## 2023-05-16 ENCOUNTER — Encounter: Payer: MEDICAID | Admitting: Speech Pathology

## 2023-05-20 ENCOUNTER — Ambulatory Visit: Payer: MEDICAID | Admitting: Physical Therapy

## 2023-05-20 ENCOUNTER — Ambulatory Visit: Payer: MEDICAID | Admitting: Occupational Therapy

## 2023-05-20 ENCOUNTER — Ambulatory Visit: Payer: MEDICAID | Admitting: Speech Pathology

## 2023-05-21 ENCOUNTER — Telehealth: Payer: Self-pay | Admitting: Physician Assistant

## 2023-05-21 NOTE — Telephone Encounter (Signed)
Please review. Called pts mom she stated he got ingrevza from the hospital by Dr. Miquel Dunn.Meidcation is not on medlist She stated its 80 MG once a day.  KP

## 2023-05-21 NOTE — Telephone Encounter (Signed)
routed

## 2023-05-21 NOTE — Telephone Encounter (Signed)
Pt mom is calling the office saying he needs refills on Ingrevza and Acidophilus.  She said he uses [facility] .  CB#  [phone]

## 2023-05-22 ENCOUNTER — Ambulatory Visit: Payer: MEDICAID | Admitting: Occupational Therapy

## 2023-05-22 ENCOUNTER — Ambulatory Visit: Payer: MEDICAID

## 2023-05-22 ENCOUNTER — Telehealth: Payer: Self-pay

## 2023-05-22 NOTE — Telephone Encounter (Signed)
See routed crm

## 2023-05-22 NOTE — Telephone Encounter (Signed)
Spoke to pts mom she verbalized understanding. She will call back to let us know if a month supply needs to be sent in to bridge the gap.  KP

## 2023-05-27 ENCOUNTER — Encounter: Payer: Self-pay | Admitting: Occupational Therapy

## 2023-05-27 ENCOUNTER — Other Ambulatory Visit: Payer: Self-pay | Admitting: Physician Assistant

## 2023-05-27 ENCOUNTER — Ambulatory Visit: Payer: MEDICAID | Attending: Physician Assistant | Admitting: Occupational Therapy

## 2023-05-27 ENCOUNTER — Encounter: Payer: MEDICAID | Admitting: Occupational Therapy

## 2023-05-27 ENCOUNTER — Encounter: Payer: MEDICAID | Admitting: Speech Pathology

## 2023-05-27 DIAGNOSIS — R4701 Aphasia: Secondary | ICD-10-CM | POA: Insufficient documentation

## 2023-05-27 DIAGNOSIS — I69351 Hemiplegia and hemiparesis following cerebral infarction affecting right dominant side: Secondary | ICD-10-CM | POA: Diagnosis not present

## 2023-05-27 DIAGNOSIS — I63512 Cerebral infarction due to unspecified occlusion or stenosis of left middle cerebral artery: Secondary | ICD-10-CM | POA: Insufficient documentation

## 2023-05-27 DIAGNOSIS — Z8673 Personal history of transient ischemic attack (TIA), and cerebral infarction without residual deficits: Secondary | ICD-10-CM | POA: Insufficient documentation

## 2023-05-27 DIAGNOSIS — R278 Other lack of coordination: Secondary | ICD-10-CM | POA: Diagnosis present

## 2023-05-27 DIAGNOSIS — R482 Apraxia: Secondary | ICD-10-CM | POA: Insufficient documentation

## 2023-05-27 DIAGNOSIS — R41841 Cognitive communication deficit: Secondary | ICD-10-CM | POA: Diagnosis present

## 2023-05-27 DIAGNOSIS — M6281 Muscle weakness (generalized): Secondary | ICD-10-CM | POA: Diagnosis present

## 2023-05-27 NOTE — Telephone Encounter (Signed)
Medication Refill - Medication: ticagrelor (BRILINTA) 90 MG TABS tablet  Pt mom Brian Rios states that the pt has been out of his medication for two months and is needing his medication. Pt mom did request for the medication [facility] since [facility] does not carrie the medication. Pt has an appt with PCP on 05/30/23. Pt mom would like a call back [phone].     Has the patient contacted their pharmacy? Yes.     Preferred Pharmacy (with phone number or street name):  [facility] [phone] [place], [place] - [address] Phone: [phone]  Fax: [phone]     Has the patient been seen for an appointment in the last year OR does the patient have an upcoming appointment? Yes.    Agent: Please be advised that RX refills may take up to 3 business days. We ask that you follow-up with your pharmacy.

## 2023-05-27 NOTE — Therapy (Addendum)
OUTPATIENT OCCUPATIONAL THERAPY NEURO EVALUATION  Patient Name: Brian Rios MRN: [id number] DOB:[birth year], 42 y.o., male Today's Date: 05/27/2023  PCP: Brian Fantasia, PA REFERRING PROVIDER: Tillie Fantasia, PA  END OF SESSION:  OT End of Session - 05/27/23 1511     Visit Number 1    Number of Visits 24    Date for OT Re-Evaluation 08/19/23    OT Start Time 1530    OT Stop Time 1615    OT Time Calculation (min) 45 min    Activity Tolerance Patient tolerated treatment well    Behavior During Therapy [facility] for tasks assessed/performed             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   left cerebrum   Erectile dysfunction    Heart murmur    Hyperlipidemia    Schizophrenia (HCC)    Tobacco abuse    Past Surgical History:  Procedure Laterality Date   BOWEL RESECTION N/A 09/04/2022   Procedure: SMALL BOWEL RESECTION, open;  Surgeon: Leafy Ro, MD;  Location: [facility] ORS;  Service: General;  Laterality: N/A;   IR CT HEAD LTD  03/21/2023   IR CT HEAD LTD  03/21/2023   IR PERCUTANEOUS ART THROMBECTOMY/INFUSION INTRACRANIAL INC DIAG ANGIO  03/21/2023   ORIF ORBITAL FRACTURE  1985   PARTIAL COLECTOMY Right 09/04/2022   Procedure: PARTIAL COLECTOMY;  Surgeon: Leafy Ro, MD;  Location: [facility] ORS;  Service: General;  Laterality: Right;  Right colectimy   RADIOLOGY WITH ANESTHESIA N/A 03/21/2023   Procedure: RADIOLOGY WITH ANESTHESIA;  Surgeon: Radiologist, Medication, MD;  Location: [facility];  Service: Radiology;  Laterality: N/A;   TEE WITHOUT CARDIOVERSION N/A 07/19/2020   Procedure: TRANSESOPHAGEAL ECHOCARDIOGRAM (TEE);  Surgeon: Lamar Blinks, MD;  Location: [facility] ORS;  Service: Cardiovascular;  Laterality: N/A;   WRIST SURGERY Left 11/07/2013   REPAIR ULNAR ARTERY, NERVE AND TENDON   Patient Active Problem List   Diagnosis Date Noted   Speech and  language deficits 04/22/2023   Hemiparesis affecting right side as late effect of cerebrovascular accident (CVA) (HCC) 04/22/2023   Spasticity 04/22/2023   Chronic diarrhea 04/22/2023   Chronic schizophrenia (HCC) 04/01/2023   S/P right colectomy 09/04/2022   Dental decay 06/12/2022   Erectile dysfunction 07/26/2021   Hyperlipidemia LDL goal <70 03/28/2021   History of embolic stroke 07/18/2020   Drug abuse (HCC) 07/17/2020   Major depressive disorder, recurrent episode, severe (HCC) 02/20/2015   Alcohol use disorder, severe, dependence (HCC) 02/20/2015   Stimulant use disorder 02/20/2015   Tobacco use disorder 02/20/2015    ONSET DATE: 03/21/23  REFERRING DIAG: left MCA territory embolic stroke  THERAPY DIAG:  Muscle weakness (generalized)  Left middle cerebral artery stroke (HCC)  Other lack of coordination  Rationale for Evaluation and Treatment: Rehabilitation  SUBJECTIVE:   SUBJECTIVE STATEMENT: Pt reports he wants to get back to using his R hand the way he used to. Pt accompanied by: self  PERTINENT HISTORY: Brian Rios is a 42 y.o. 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  PRECAUTIONS: None  WEIGHT BEARING RESTRICTIONS: No  PAIN:  Are you having pain? No  FALLS: Has patient fallen in last 6 months? Yes. Number of falls 2  LIVING ENVIRONMENT: Lives with: lives with their family mother Lives in: Mobile home Stairs: Yes: External: 1 steps; can reach both Has following equipment at home: shower chair  PLOF: Independent  PATIENT GOALS: Return to PLOF  OBJECTIVE:   HAND DOMINANCE: Right  ADLs: Overall ADLs: IND Transfers/ambulation related to ADLs: Eating: IND Grooming: IND UB Dressing: IND LB Dressing: assist for buttons, knots  Toileting: IND Bathing: IND Tub  Shower transfers: IND Equipment: none  IADLs: Shopping: Mother completes  Light housekeeping: Mother completes majority, pt washes dishes Meal Prep: IND Community mobility: IND Medication management: Mother completes Financial management: Mother completes Handwriting: Not legible  MOBILITY STATUS: Independent  POSTURE COMMENTS:  No Significant postural limitations Sitting balance:  IND  ACTIVITY TOLERANCE: Activity tolerance: Tolerates ~30 min moderate activity  FUNCTIONAL OUTCOME MEASURES: FOTO: 58  UPPER EXTREMITY ROM:  WFL (Blank rows = not tested)  UPPER EXTREMITY MMT:   WFL  HAND FUNCTION: Grip strength: Right: 29 lbs; Left: 74 lbs, Lateral pinch: Right: 12 lbs, Left: 25 lbs, and 3 point pinch: Right: 10 lbs, Left: 23 lbs  COORDINATION: 9 Hole Peg test: Right: 26 sec; Left: 30 sec for R hand pt removed pegs only, unable to place  SENSATION: Light touch: Impaired endorses tingling  EDEMA: none    COGNITION: Overall cognitive status:  difficult to assess 2/2 aphasia   VISION: Subjective report: Pt reports blurriness Baseline vision:  cross eyed Visual history:  cross eyed  VISION ASSESSMENT: To be further assessed in functional context   PERCEPTION: WFL  PRAXIS: WFL  OBSERVATIONS: Pt has L eye weeping intermittently t/o session.    TODAY'S TREATMENT:                                                                                                                              DATE: 05/27/23   Measurements obtained and goals created.  Green theraputty provided and HEP reviewed.   PATIENT EDUCATION: Education details: HEP, role of OT Person educated: Patient Education method: Medical illustrator Education comprehension: needs further education  HOME EXERCISE PROGRAM: [place] Si Theraputty provided   GOALS: Goals reviewed with patient? Yes  SHORT TERM GOALS: Target date: 07/08/23  Pt will improve FOTO score by 5 points to reflect  improved perceived function performance in specific ADLS/IADLs.  Baseline: 58 Goal status: INITIAL  LONG TERM GOALS: Target date: 08/19/23  Pt will increase R hand grip strength by 15# to be able to securely hold objects, open containers and jars. Baseline: L 74#, R 29#  Goal status: INITIAL  2.  Pt will improve R [facility] skills by completing 9 hole PEG test assessment to independently button pants and tie knots.  Baseline: R 26 sec to remove pegs; unable to place Goal status: INITIAL  3.  Pt will improve R lateral pinch strength by 5# to securely hold items for ADLs, and IADLs. Baseline: R lateral pinch 12# Goal status: INITIAL  4.  Pt  will print first and last name with 75% legibility to sign medical forms. Baseline: 0% legibility, maintains grip on pen with built up handle Goal status: INITIAL  5.  Pt will complete medication management task with no cues to complete Baseline: dependent  Goal status: INITIAL   ASSESSMENT:  CLINICAL IMPRESSION: Patient is a 42 y.o. M who was seen today for occupational therapy evaluation for dominant RUE strength and coordination deficits impacting I/ADLs. FOTO score 58. R grip (29#) testing noted to be significantly less than non dominant L grip (74#). Pt unable to manipulate 9 hole pegs to complete test, however can remove pegs with moderate dropping noted. Pt would benefit from skilled OT services to improve R grip/pinch strength and FMC in order to improve engagement of RUE during I/ADL tasks.    PERFORMANCE DEFICITS: in functional skills including coordination, dexterity, strength, and Fine motor control, cognitive skills including memory, and psychosocial skills including routines and behaviors.   IMPAIRMENTS: are limiting patient from ADLs, IADLs, and work.   CO-MORBIDITIES: may have co-morbidities  that affects occupational performance. Patient will benefit from skilled OT to address above impairments and improve overall  function.  MODIFICATION OR ASSISTANCE TO COMPLETE EVALUATION: Min-Moderate modification of tasks or assist with assess necessary to complete an evaluation.  OT OCCUPATIONAL PROFILE AND HISTORY: Detailed assessment: Review of records and additional review of physical, cognitive, psychosocial history related to current functional performance.  CLINICAL DECISION MAKING: Moderate - several treatment options, min-mod task modification necessary  REHAB POTENTIAL: Good  EVALUATION COMPLEXITY: Moderate    PLAN:  OT FREQUENCY: 2x/week  OT DURATION: 12 weeks  PLANNED INTERVENTIONS: self care/ADL training, therapeutic exercise, therapeutic activity, neuromuscular re-education, balance training, patient/family education, cognitive remediation/compensation, visual/perceptual remediation/compensation, and DME and/or AE instructions  RECOMMENDED OTHER SERVICES: SLP  CONSULTED AND AGREED WITH PLAN OF CARE: Patient  PLAN FOR NEXT SESSION: initiate HEP   Presley Raddle, OT 05/27/2023, 4:50 PM  Kathie Dike, M.S. OTR/L  05/27/23, 4:50 PM  ascom [phone]

## 2023-05-27 NOTE — Addendum Note (Signed)
Addended by: Presley Raddle on: 05/27/2023 05:00 PM   Modules accepted: Orders

## 2023-05-28 MED ORDER — TICAGRELOR 90 MG PO TABS: 90.0000 mg | ORAL_TABLET | Freq: Two times a day (BID) | ORAL | 0 refills | Status: DC

## 2023-05-28 NOTE — Telephone Encounter (Signed)
Resending medication to [facility].  Requested Prescriptions  Pending Prescriptions Disp Refills   ticagrelor (BRILINTA) 90 MG TABS tablet 180 tablet 0    Sig: Take 1 tablet (90 mg total) by mouth 2 (two) times daily.     Hematology: Antiplatelets - ticagrelor Passed - 05/27/2023 12:29 PM      Passed - Cr in normal range and within 180 days    Creatinine  Date Value Ref Range Status  02/14/2015 0.88 mg/dL Final    Comment:    4.78-2.95 NOTE: New Reference Range  12/28/14    Creatinine, Ser  Date Value Ref Range Status  03/28/2023 0.96 0.61 - 1.24 mg/dL Final         Passed - HCT in normal range and within 180 days    HCT  Date Value Ref Range Status  03/28/2023 41.8 39.0 - 52.0 % Final   Hematocrit  Date Value Ref Range Status  07/18/2022 42.8 37.5 - 51.0 % Final         Passed - HGB in normal range and within 180 days    Hemoglobin  Date Value Ref Range Status  03/28/2023 13.7 13.0 - 17.0 g/dL Final  62/13/0865 78.4 13.0 - 17.7 g/dL Final         Passed - PLT in normal range and within 180 days    Platelets  Date Value Ref Range Status  03/28/2023 329 150 - 400 K/uL Final  07/18/2022 229 150 - 450 x10E3/uL Final         Passed - Last Heart Rate in normal range    Pulse Readings from Last 1 Encounters:  05/02/23 70         Passed - Valid encounter within last 6 months    Recent Outpatient Visits           3 weeks ago Hemiparesis affecting right side as late effect of cerebrovascular accident (CVA) (HCC)   [place] [facility] at [facility] Mordecai Maes, Melton Alar, PA       Future Appointments             In 2 days Mordecai Maes, Melton Alar, PA [facility] at [facility], [facility]

## 2023-05-29 ENCOUNTER — Ambulatory Visit: Payer: MEDICAID

## 2023-05-29 ENCOUNTER — Encounter: Payer: MEDICAID | Admitting: Occupational Therapy

## 2023-05-30 ENCOUNTER — Ambulatory Visit (INDEPENDENT_AMBULATORY_CARE_PROVIDER_SITE_OTHER): Payer: MEDICAID | Admitting: Physician Assistant

## 2023-05-30 ENCOUNTER — Encounter: Payer: Self-pay | Admitting: Physician Assistant

## 2023-05-30 VITALS — BP 102/74 | HR 72 | Temp 97.6°F | Ht 70.0 in | Wt 203.0 lb

## 2023-05-30 DIAGNOSIS — F17200 Nicotine dependence, unspecified, uncomplicated: Secondary | ICD-10-CM

## 2023-05-30 DIAGNOSIS — F1910 Other psychoactive substance abuse, uncomplicated: Secondary | ICD-10-CM

## 2023-05-30 DIAGNOSIS — F209 Schizophrenia, unspecified: Secondary | ICD-10-CM | POA: Diagnosis not present

## 2023-05-30 NOTE — Patient Instructions (Signed)
-  It was a pleasure to see you today! Please review your visit summary for helpful information -Lab results are usually available within 1-2 days and we will call once reviewed -I would encourage you to follow your care via MyChart where you can access lab results, notes, messages, and more -If you feel that we did a nice job today, please complete your after-visit survey and leave us a Google review! Your CMA today was Kieandra and your provider was Dan , PA-C, DMSc -Please return for follow-up in about 3 months  

## 2023-05-30 NOTE — Progress Notes (Signed)
Date:  05/30/2023   Name:  Brian Rios   DOB:  1981-03-23   MRN:  [id number]   Chief Complaint: Follow-up (CVA)  HPI [name] returns for 1 month follow-up on hemiparesis, tobacco use, and marijuana use after establishing care with me last visit.  He has history of 2 prior strokes (2021 and again more recently June 2024) both of which felt to be related to drug use including cocaine.  UDS last time positive for marijuana but negative for all else.  He denies further cocaine use or any other illicit substances besides marijuana.   Unfortunately he is still smoking cigarettes and marijuana every day and is not willing to quit.  Says they gave him patches while he was hospitalized last time, but as soon as he got out of the hospital he stopped using the patches and went right back to cigarettes.    His mother wrote a note for me requesting refills for benztropine, Ingrezza, and acidophilus -upon review of his pill bottles, it seems he has multiple refills for each of these medications still available to him.  No appt sched with neuro or psych yet.  OT starts 06/03/23    Medication list has been reviewed and updated.  Current Meds  Medication Sig   ABILIFY ASIMTUFII 960 MG/3.2ML PRSY Inject into the muscle.   acetaminophen (TYLENOL) 325 MG tablet Take 2 tablets (650 mg total) by mouth every 4 (four) hours as needed for mild pain (or temp > 37.5 C (99.5 F)).   ARIPiprazole (ABILIFY) 30 MG tablet Take 1 tablet (30 mg total) by mouth at bedtime.   aspirin 81 MG chewable tablet Chew 1 tablet (81 mg total) by mouth daily.   atorvastatin (LIPITOR) 40 MG tablet Take 1 tablet (40 mg total) by mouth daily.   benztropine (COGENTIN) 1 MG tablet Take 1 mg by mouth at bedtime.   divalproex (DEPAKOTE ER) 250 MG 24 hr tablet Take 3 tablets (750 mg total) by mouth at bedtime.   escitalopram (LEXAPRO) 20 MG tablet Take 1.5 tablets (30 mg total) by mouth daily.   Lactobacillus (ACIDOPHILUS PO) Take  by mouth in the morning and at bedtime.   loperamide (IMODIUM A-D) 2 MG tablet Take 2 tablets (4 mg total) by mouth 3 (three) times daily.   mirtazapine (REMERON) 45 MG tablet Take 1 tablet (45 mg total) by mouth at bedtime.   ticagrelor (BRILINTA) 90 MG TABS tablet Take 1 tablet (90 mg total) by mouth 2 (two) times daily.   valbenazine (INGREZZA) 80 MG capsule Take 80 mg by mouth daily.     Review of Systems  Constitutional:  Negative for fatigue and fever.  Respiratory:  Negative for chest tightness and shortness of breath.   Cardiovascular:  Negative for chest pain and palpitations.  Gastrointestinal:  Negative for abdominal pain.  Neurological:  Positive for speech difficulty and weakness.  Psychiatric/Behavioral:  Positive for dysphoric mood. Negative for suicidal ideas. The patient is not nervous/anxious.     Patient Active Problem List   Diagnosis Date Noted   Speech and language deficits 04/22/2023   Hemiparesis affecting right side as late effect of cerebrovascular accident (CVA) (HCC) 04/22/2023   Spasticity 04/22/2023   Chronic diarrhea 04/22/2023   Chronic schizophrenia (HCC) 04/01/2023   S/P right colectomy 09/04/2022   Dental decay 06/12/2022   Erectile dysfunction 07/26/2021   Hyperlipidemia LDL goal <70 03/28/2021   History of embolic stroke 07/18/2020   Drug abuse (HCC) 07/17/2020  Major depressive disorder, recurrent episode, severe (HCC) 02/20/2015   Alcohol use disorder, severe, dependence (HCC) 02/20/2015   Stimulant use disorder 02/20/2015   Tobacco use disorder 02/20/2015    No Known Allergies  Immunization History  Administered Date(s) Administered   Tdap 11/07/2013, 04/05/2016    Past Surgical History:  Procedure Laterality Date   BOWEL RESECTION N/A 09/04/2022   Procedure: SMALL BOWEL RESECTION, open;  Surgeon: Leafy Ro, MD;  Location: [facility] ORS;  Service: General;  Laterality: N/A;   IR CT HEAD LTD  03/21/2023   IR CT HEAD LTD  03/21/2023    IR PERCUTANEOUS ART THROMBECTOMY/INFUSION INTRACRANIAL INC DIAG ANGIO  03/21/2023   ORIF ORBITAL FRACTURE  1985   PARTIAL COLECTOMY Right 09/04/2022   Procedure: PARTIAL COLECTOMY;  Surgeon: Leafy Ro, MD;  Location: [facility] ORS;  Service: General;  Laterality: Right;  Right colectimy   RADIOLOGY WITH ANESTHESIA N/A 03/21/2023   Procedure: RADIOLOGY WITH ANESTHESIA;  Surgeon: Radiologist, Medication, MD;  Location: [facility] OR;  Service: Radiology;  Laterality: N/A;   TEE WITHOUT CARDIOVERSION N/A 07/19/2020   Procedure: TRANSESOPHAGEAL ECHOCARDIOGRAM (TEE);  Surgeon: Lamar Blinks, MD;  Location: [facility] ORS;  Service: Cardiovascular;  Laterality: N/A;   WRIST SURGERY Left 11/07/2013   REPAIR ULNAR ARTERY, NERVE AND TENDON    Social History   Tobacco Use   Smoking status: Every Day    Current packs/day: 1.00    Average packs/day: 1 pack/day for 15.0 years (15.0 ttl pk-yrs)    Types: Cigarettes    Passive exposure: Current   Smokeless tobacco: Never  Vaping Use   Vaping status: Never Used  Substance Use Topics   Alcohol use: Yes    Alcohol/week: 6.0 standard drinks of alcohol    Types: 6 Cans of beer per week   Drug use: Yes    Frequency: 7.0 times per week    Types: "Crack" cocaine, Marijuana    Comment: Marijuana daily. USED 6 MONTHS AGO CRACK (2023)    Family History  Problem Relation Age of Onset   Alcoholism Mother    Hypertension Mother    Breast cancer Mother    Alcoholism Father    Stroke Maternal Grandmother    Hypertension Maternal Grandmother    Diabetes Maternal Grandmother         05/30/2023   10:45 AM 05/02/2023   10:03 AM  GAD 7 : Generalized Anxiety Score  Nervous, Anxious, on Edge 0 2  Control/stop worrying 0 3  Worry too much - different things 0 3  Trouble relaxing 0 3  Restless 0 3  Easily annoyed or irritable 0 3  Afraid - awful might happen 0 0  Total GAD 7 Score 0 17  Anxiety Difficulty Not difficult at all Extremely difficult        05/30/2023   10:45 AM 05/02/2023   10:02 AM 04/22/2023   10:56 AM  Depression screen PHQ 2/9  Decreased Interest 0 0 0  Down, Depressed, Hopeless 2 0 3  PHQ - 2 Score 2 0 3  Altered sleeping 0 0 0  Tired, decreased energy 0 0 3  Change in appetite 3 3 0  Feeling bad or failure about yourself  2 0 0  Trouble concentrating 2 3 3   Moving slowly or fidgety/restless 2 3 3   Suicidal thoughts 0 0 0  PHQ-9 Score 11 9 12   Difficult doing work/chores Very difficult Extremely dIfficult Somewhat difficult    BP Readings from Last 3  Encounters:  05/30/23 102/74  05/02/23 104/78  04/22/23 133/82    Wt Readings from Last 3 Encounters:  05/30/23 203 lb (92.1 kg)  05/02/23 204 lb (92.5 kg)  04/22/23 201 lb 12.8 oz (91.5 kg)    BP 102/74   Pulse 72   Temp 97.6 F (36.4 C) (Oral)   Ht 5\' 10"  (1.778 m)   Wt 203 lb (92.1 kg)   SpO2 97%   BMI 29.13 kg/m   Physical Exam Vitals and nursing note reviewed.  Constitutional:      Appearance: Normal appearance.  Eyes:     Comments: Left exotropia  Cardiovascular:     Rate and Rhythm: Normal rate.  Pulmonary:     Effort: Pulmonary effort is normal.  Abdominal:     General: There is no distension.  Musculoskeletal:        General: Normal range of motion.  Skin:    General: Skin is warm and dry.  Neurological:     Mental Status: He is alert and oriented to person, place, and time.     Gait: Gait is intact.     Comments: Gait intact without assistance. Some speech difficulty apparent, but patient is mostly communicative and easy to understand.    Psychiatric:        Mood and Affect: Mood and affect normal.     Recent Labs     Component Value Date/Time   NA 137 03/28/2023 1038   NA 138 07/18/2022 0951   NA 137 02/14/2015 1915   K 3.9 03/28/2023 1038   K 3.7 02/14/2015 1915   CL 104 03/28/2023 1038   CL 107 02/14/2015 1915   CO2 23 03/28/2023 1038   CO2 21 (L) 02/14/2015 1915   GLUCOSE 96 03/28/2023 1038   GLUCOSE 85 02/14/2015  1915   BUN 7 03/28/2023 1038   BUN 12 07/18/2022 0951   BUN 12 02/14/2015 1915   CREATININE 0.96 03/28/2023 1038   CREATININE 0.88 02/14/2015 1915   CALCIUM 9.0 03/28/2023 1038   CALCIUM 9.3 02/14/2015 1915   PROT 6.8 03/28/2023 1038   PROT 5.9 (L) 07/18/2022 0951   PROT 8.3 (H) 02/14/2015 1915   ALBUMIN 2.8 (L) 03/28/2023 1038   ALBUMIN 3.9 (L) 07/18/2022 0951   ALBUMIN 4.7 02/14/2015 1915   AST 33 03/28/2023 1038   AST 25 02/14/2015 1915   ALT 34 03/28/2023 1038   ALT 32 02/14/2015 1915   ALKPHOS 59 03/28/2023 1038   ALKPHOS 62 02/14/2015 1915   BILITOT 0.6 03/28/2023 1038   BILITOT <0.2 07/18/2022 0951   BILITOT 0.3 02/14/2015 1915   GFRNONAA >60 03/28/2023 1038   GFRNONAA >60 02/14/2015 1915   GFRAA >60 07/17/2020 1338   GFRAA >60 02/14/2015 1915    Lab Results  Component Value Date   WBC 12.6 (H) 03/28/2023   HGB 13.7 03/28/2023   HCT 41.8 03/28/2023   MCV 83.4 03/28/2023   PLT 329 03/28/2023   Lab Results  Component Value Date   HGBA1C 5.6 03/23/2023   Lab Results  Component Value Date   CHOL 113 03/22/2023   HDL 37 (L) 03/22/2023   LDLCALC 54 03/22/2023   TRIG 109 03/22/2023   CHOLHDL 3.1 03/22/2023   Lab Results  Component Value Date   TSH 4.040 03/28/2021     Assessment and Plan:  1. Tobacco use disorder Strongly encouraged tobacco cessation, though he is not ready to quit.  We discussed his increased risk for a third stroke  and also delayed recovery from the prior 2 strokes.  I gave him a handout about the benefits of smoking cessation.  He will think about it.  2. Chronic schizophrenia (HCC) Needs to establish with behavioral health, but it seems this appointment has not been made yet.  Encouraged to follow-up on referral  3. Drug abuse (HCC) Discouraged marijuana use as well as any other illicit substances.  Will recheck UDS next time   Return in about 3 months (around 08/30/2023) for OV f/u chronic conditions.    Alvester Morin, PA-C,  DMSc, Nutritionist [facility] [phone]

## 2023-05-31 ENCOUNTER — Other Ambulatory Visit: Payer: Self-pay | Admitting: Physician Assistant

## 2023-05-31 ENCOUNTER — Encounter: Payer: MEDICAID | Admitting: Speech Pathology

## 2023-05-31 NOTE — Telephone Encounter (Signed)
Called patient and spoke with patient's mother, Morley Kos. She state that patient is taking  lactobacillus capsules.

## 2023-05-31 NOTE — Telephone Encounter (Signed)
Medication Refill - Medication: Lactobacillus (ACIDOPHILUS PO) [[id number]]   Has the patient contacted their pharmacy? Yes.   (Agent: If no, request that the patient contact the pharmacy for the refill. If patient does not wish to contact the pharmacy document the reason why and proceed with request.) (Agent: If yes, when and what did the pharmacy advise?)  Preferred Pharmacy (with phone number or street name):  [facility] [place], [place] - [address], [place] [address] Phone: [phone]  Fax: [phone]     Has the patient been seen for an appointment in the last year OR does the patient have an upcoming appointment? Yes.    Agent: Please be advised that RX refills may take up to 3 business days. We ask that you follow-up with your pharmacy.

## 2023-06-03 ENCOUNTER — Encounter: Payer: MEDICAID | Admitting: Speech Pathology

## 2023-06-03 ENCOUNTER — Ambulatory Visit: Payer: MEDICAID | Admitting: Physical Therapy

## 2023-06-03 ENCOUNTER — Ambulatory Visit: Payer: MEDICAID

## 2023-06-03 ENCOUNTER — Encounter: Payer: MEDICAID | Admitting: Occupational Therapy

## 2023-06-03 ENCOUNTER — Telehealth: Payer: Self-pay

## 2023-06-03 NOTE — Telephone Encounter (Signed)
Requested medication (s) are due for refill today: Yes  Requested medication (s) are on the active medication list: Yes  Last refill:  05/02/23  Future visit scheduled: No  Notes to clinic:  Historical provider.    Requested Prescriptions  Pending Prescriptions Disp Refills   Lactobacillus (ACIDOPHILUS) CAPS capsule      Sig: Take by mouth in the morning and at bedtime.     Endocrinology:  Nutritional Agents Passed - 05/31/2023 10:04 AM      Passed - Valid encounter within last 12 months    Recent Outpatient Visits           4 days ago Tobacco use disorder   [facility] at [facility], Melton Alar, [place]   1 month ago Hemiparesis affecting right side as late effect of cerebrovascular accident (CVA) [facility])   [facility] at [facility], Melton Alar, [place]       Future Appointments             In 2 months Mordecai Maes, Melton Alar, PA [facility] Medicine at [facility], [facility]

## 2023-06-03 NOTE — Telephone Encounter (Signed)
Please review.  KP

## 2023-06-03 NOTE — Telephone Encounter (Signed)
-----   Message from Allen Norris sent at 05/30/2023 11:13 AM EDT ----- His mother wrote a note for me requesting refills for benztropine, Ingrezza, and acidophilus -upon review of his pill bottles, it seems he has multiple refills for each of these medications still available to him. Please let her know.    Please also ask if they have scheduled a visit with [facility] neurology, as discussed on previous correspondence

## 2023-06-03 NOTE — Telephone Encounter (Signed)
Mother is calling to report that that there is no record of Ingrezza.

## 2023-06-03 NOTE — Telephone Encounter (Signed)
Called Brian Rios she verbalized understanding. She stated she got the noted that was written to her.  KP

## 2023-06-03 NOTE — Telephone Encounter (Signed)
Spoke to mother she is aware there are refills. She is going to call to see.  KP

## 2023-06-05 ENCOUNTER — Encounter: Payer: MEDICAID | Admitting: Occupational Therapy

## 2023-06-05 ENCOUNTER — Ambulatory Visit: Payer: MEDICAID

## 2023-06-05 ENCOUNTER — Ambulatory Visit: Payer: MEDICAID | Admitting: Speech Pathology

## 2023-06-05 DIAGNOSIS — I63512 Cerebral infarction due to unspecified occlusion or stenosis of left middle cerebral artery: Secondary | ICD-10-CM

## 2023-06-05 DIAGNOSIS — M6281 Muscle weakness (generalized): Secondary | ICD-10-CM | POA: Diagnosis not present

## 2023-06-05 DIAGNOSIS — R41841 Cognitive communication deficit: Secondary | ICD-10-CM

## 2023-06-05 DIAGNOSIS — R4701 Aphasia: Secondary | ICD-10-CM

## 2023-06-05 DIAGNOSIS — R482 Apraxia: Secondary | ICD-10-CM

## 2023-06-05 NOTE — Therapy (Unsigned)
OUTPATIENT SPEECH LANGUAGE PATHOLOGY APHASIA EVALUATION   Patient Name: Remy Kihm MRN: [id number] DOB:1981-05-31, 42 y.o., male Today's Date: 06/05/2023  PCP: Tillie Fantasia, PA REFERRING PROVIDER: Tillie Fantasia, PA   End of Session - 06/05/23 0928     Visit Number 1    Number of Visits 13    Date for SLP Re-Evaluation 08/28/23    Authorization Type [employer]    Progress Note Due on Visit 10    SLP Start Time 0930    SLP Stop Time  1015    SLP Time Calculation (min) 45 min    Activity Tolerance Patient tolerated treatment well             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   left cerebrum   Erectile dysfunction    Heart murmur    Hyperlipidemia    Schizophrenia (HCC)    Tobacco abuse    Past Surgical History:  Procedure Laterality Date   BOWEL RESECTION N/A 09/04/2022   Procedure: SMALL BOWEL RESECTION, open;  Surgeon: Leafy Ro, MD;  Location: [facility] ORS;  Service: General;  Laterality: N/A;   IR CT HEAD LTD  03/21/2023   IR CT HEAD LTD  03/21/2023   IR PERCUTANEOUS ART THROMBECTOMY/INFUSION INTRACRANIAL INC DIAG ANGIO  03/21/2023   ORIF ORBITAL FRACTURE  1985   PARTIAL COLECTOMY Right 09/04/2022   Procedure: PARTIAL COLECTOMY;  Surgeon: Leafy Ro, MD;  Location: [facility] ORS;  Service: General;  Laterality: Right;  Right colectimy   RADIOLOGY WITH ANESTHESIA N/A 03/21/2023   Procedure: RADIOLOGY WITH ANESTHESIA;  Surgeon: Radiologist, Medication, MD;  Location: [facility] OR;  Service: Radiology;  Laterality: N/A;   TEE WITHOUT CARDIOVERSION N/A 07/19/2020   Procedure: TRANSESOPHAGEAL ECHOCARDIOGRAM (TEE);  Surgeon: Lamar Blinks, MD;  Location: [facility] OR[facility];  Service: Cardiovascular;  Laterality: N/A;   WRIST SURGERY Left 11/07/2013   REPAIR ULNAR ARTERY, NERVE AND TENDON   Patient Active Problem List   Diagnosis Date Noted    Speech and language deficits 04/22/2023   Hemiparesis affecting right side as late effect of cerebrovascular accident (CVA) (HCC) 04/22/2023   Spasticity 04/22/2023   Chronic diarrhea 04/22/2023   Chronic schizophrenia (HCC) 04/01/2023   S/P right colectomy 09/04/2022   Dental decay 06/12/2022   Erectile dysfunction 07/26/2021   Hyperlipidemia LDL goal <70 03/28/2021   History of embolic stroke 07/18/2020   Drug abuse (HCC) 07/17/2020   Major depressive disorder, recurrent episode, severe (HCC) 02/20/2015   Alcohol use disorder, severe, dependence (HCC) 02/20/2015   Stimulant use disorder 02/20/2015   Tobacco use disorder 02/20/2015    ONSET DATE: 03/22/2023; 05/02/2023 date of referral  REFERRING DIAG:  I69.351 (ICD-10-CM) - Hemiparesis affecting right side as late effect of cerebrovascular accident (CVA) (HCC)  Z86.73 (ICD-10-CM) - History of embolic stroke  N82.9 (ICD-10-CM) - Speech and language deficits    THERAPY DIAG:  Aphasia  Apraxia  Cognitive communication deficit  Left middle cerebral artery stroke (HCC)  Rationale for Evaluation and Treatment Rehabilitation  SUBJECTIVE:   SUBJECTIVE STATEMENT: Pt with decreased speech intelligibility, eager to participate Pt accompanied by: self  PERTINENT HISTORY and DIAGNOSTIC FINDINGS: Patient is a 42 year old right-handed male with PMH of alcohol/tobacco use, left basal ganglia embolic stroke in 2021 (no residual deficits), schizophrenia maintained on Abilify, Depakote, Lexapro as well as Cogentin,  hyperlipidemia, and 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 Presented 03/21/2023 to [facility] with acute onset of right-sided weakness facial droop and aphasia. Cranial CT scan worrisome for acute infarct in the posterior left parietal lobe. CTA showed occlusion of 2 proximal left M2 branches and mild left M1 stenosis. Widely patent carotid arteries.  Patient transferred to [facility] for admission and underwent arteriogram with  revascularization of occluded left MCA however it reoccluded. MRI of the brain showed interval increase in size of acute/subacute nonhemorrhagic infarct involving the majority the left insular cortex and posterior left frontal operculum extending into the high posterior left parietal lobe. No acute hemorrhage or significant mass effect. Patient admitted to CIR on 03/27/23, discharged .  PAIN:  Are you having pain? No  FALLS: Has patient fallen in last 6 months?  No  LIVING ENVIRONMENT: Lives with:  mother Lives in: Mobile home  PLOF:  Level of assistance: Independent with ADLs, Independent with IADLs Employment: Part-time employment   PATIENT GOALS    "to get my speech right"   OBJECTIVE:  COGNITION: Overall cognitive status: Difficulty to assess due to: Communication impairment and no family present Areas of impairment:  Attention: Impaired: Selective Functional deficits: some informal deficits in attention to task observed  AUDITORY COMPREHENSION: Overall auditory comprehension: Appears intact YES/NO questions: Appears intact Following directions: Appears intact Conversation: Simple Effective technique: repetition/stressing words   READING COMPREHENSION: N/T d/t pt reported vision deficits, pt states that he lost his glasses  EXPRESSION: verbal  VERBAL EXPRESSION: Overall verbal expression: Impaired: simple Level of generative/spontaneous verbalization: sentence Automatic speech: name: impaired and social response: impaired  Repetition: Impaired: Word Naming: Responsive: 76-100%, Confrontation: 51-75%, Convergent: 26-50%, and Divergent: 26-50% Pragmatics: Appears intact Interfering components: speech intelligibility Effective technique: open ended questions and sentence completion Non-verbal means of communication: N/A  WRITTEN EXPRESSION: Dominant hand: right  Written expression: Not tested  MOTOR SPEECH: Overall motor speech: impaired Level of impairment:  Sentence Respiration: diaphragmatic/abdominal breathing Phonation: normal and low vocal intensity Resonance: WFL Articulation: Impaired: phrase and sentence Intelligibility: Intelligibility reduced Motor planning: Impaired: aware and inconsistent Motor speech errors: aware and inconsistent Interfering components:  N/A Effective technique: slow rate and increased vocal intensity   ORAL MOTOR EXAMINATION Facial : Symmetry impaired: Impaired right, Strength impaired: Impaired right, Sensation impaired: Impaired right Lingual: Symmetry Impaired: Impaired right, Strength Impaired: Impaired right, Sensation Impaired: Impaired right Velum: WFL Mandible: WFL Cough: WFL Voice: WFL   STANDARDIZED ASSESSMENTS:   Western Aphasia Battery- Revised  Spontaneous Speech                           Information content               7/10                                            Fluency                                 5/10                                          Comprehension     Yes/No questions  57/60                                           Auditory Word Recognition  55/60                                Sequential Commands       64/80                              Repetition                             90/100                                        Naming    Object Naming                     60/60 (multiple self-corrections for phonemic paraphasias)                                          Word Fluency                        6/20                                            Sentence Completion          10/10                                             Responsive Speech              10/10                                         Aphasia Quotient                  76.8/100         Pt's severity rating was mild as indicated by an Aphasia Quotient of 76.8  (0-25=very severe, 26-50=severe, 51-75=moderate, 76 and above is mild). Pt's presentation is most consistent with Anomic subtype,  characterized by mildly impaired verbal expression, functional auditory comprehension, and functional repetition. Pt's verbal expression is characterized by halting speech d/t word finding deficits  and decreased speech intelligibility d/t imprecise articulations and phoneme perseveration. Pt's speech intelligibility is < 50% at the sentence level.    PATIENT REPORTED OUTCOME MEASURES (PROM): Communicative Participation Item Bank (CPIB) Age Range: 18+   The Communicative Participation Item Bank (CPIB) is a patient-reported outcomes instrument that targets the construct of communicative participation [employer] et al., 2013). The CPIB was developed with the intent of being appropriate for community-dwelling adults in the  variety of conversational situations they frequently engage in as part of life roles at home, at work, and in social and leisure situations. All items in the CPIB start with the stem, 'Does your condition interfere with..' followed by various conversational situations such as 'Making a phone call to get information,' or 'Having a conversation in a noisy place.' Respondents choose from four response categories to rate the level of interference they experience in that situation, ranging from 'Not at all,' to 'Very much.' The item bank consists of 46 items, and a paper-and-pencil 10-item disorder-generic short form is available. The CPIB scores are reported as T-scores where 50 = the mean of the calibration sample and standard deviation = 10.  Does your condition interfere with talking with people you know? QUITE A BIT (value=1) Does your condition interfere with communicating when you need to say something quickly? VERY MUCH (value=0) Does your condition interfere with talking with people you do NOT know? VERY MUCH (value=0) Does your condition interfere with communicating when you are out in the community (e.g., running errands, appointments)? VERY MUCH (value=0) Does your condition interfere  with asking questions in a conversation? VERY MUCH (value=0) Does your condition interfere with communicating in a small group of people? VERY MUCH (value=0) Does your condition interfere with having a long conversation with someone you know about a book, movie, show or sports event? VERY MUCH (value=0) Does your condition interfere with giving someone DETAILED information? VERY MUCH (value=0) Does your condition interfere with getting your turn in a fast-moving conversation? VERY MUCH (value=0) Does your condition interfere with trying to persuade a friend or family member to see a different point of view? VERY MUCH (value=0)  Pt's T-Score is 28 which is considered 2 SD below mean of 50.     TODAY'S TREATMENT:  N/A   PATIENT EDUCATION: Education details: results of this assessment, ST POC Person educated: Patient Education method: Explanation Education comprehension: needs further education  HOME EXERCISE PROGRAM:   N/A    GOALS:  Goals reviewed with patient? Yes  SHORT TERM GOALS: Target date: 10 sessions  With Min A, patient will describe visual scenes using 3 or more sentences at 75% accuracy. Baseline: Goal status: INITIAL  2.  With moderate assistance, pt will list 10 items within basic to semi-complex category.  Baseline:  Goal status: INITIAL  3.  With Rare Min A, patient will demo HEP for motor speech accurately in 3/5 opportunities.  Baseline:  Goal status: INITIAL  LONG TERM GOALS: Target date: 08/28/2023  With Supervision A, patient will participate in simple conversation at 90% accuracy.  Baseline:  Goal status: INITIAL  2.  With Supervision A, patient will use a circumlocution strategy, writing, drawing, and/or gesturing to describe target words with 80% accuracy to improve word-finding and reduce communication breakdowns,  Baseline:  Goal status: INITIAL  3.  With Rare Min A, patient/family will demonstrate understanding of the following concepts:  aphasia, spontaneous recovery, communication vs conversation, strengths/strategies to promote success, local resources by answering multiple choice questions with 75% accuracy when provided supported conversation in order to increase patient's participation in medical care.    Baseline:  Goal status: INITIAL  ASSESSMENT:  CLINICAL IMPRESSION: Patient is a 42 y.o. right handed male who was seen today for a speech language evaluation. 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 Pt's expressive communication is c/b short grammatically incomplete utterances d/t word finding deficits with observed phonemic preservation.  Pt's speech intelligibility is reduced by imprecise articulation, low vocal intensity and fast rate of speech resulting in ~ 50% speech intelligibility at the sentence level. While pt is able to self-monitor and self-correct at the word level, he is not able to increase speech intelligibility at the sentence level.   OBJECTIVE IMPAIRMENTS include awareness, expressive language, aphasia, apraxia, and dysarthria. These impairments are limiting patient from return to work, managing appointments, managing finances, ADLs/IADLs, and effectively communicating at home and in community. Factors affecting potential to achieve goals and functional outcome are co-morbidities, medical prognosis, previous level of function, severity of impairments, and insurance limits on number of visits . Patient will benefit from skilled SLP services to address above impairments and improve overall function.  REHAB POTENTIAL: Good  PLAN: SLP FREQUENCY: 1-2x/week  SLP DURATION: 12 weeks  PLANNED INTERVENTIONS: Language facilitation, Cueing hierachy, Internal/external aids, Oral motor exercises, Multimodal communication approach, SLP instruction and feedback, Compensatory strategies, and Patient/family education    Joshva Labreck B. Dreama Saa, M.S., CCC-SLP,  Tree surgeon Certified Brain Injury Specialist [facility]  [facility] Rehabilitation Services Office [phone] Ascom [phone] Fax [phone]

## 2023-06-07 ENCOUNTER — Other Ambulatory Visit: Payer: Self-pay | Admitting: Physician Assistant

## 2023-06-07 ENCOUNTER — Other Ambulatory Visit (HOSPITAL_COMMUNITY): Payer: Self-pay

## 2023-06-07 NOTE — Telephone Encounter (Signed)
valbenazine (INGREZZA) 80 MG capsule   [phone] lactobacillus (ACIDOPHILUS PO)

## 2023-06-10 ENCOUNTER — Encounter: Payer: MEDICAID | Admitting: Speech Pathology

## 2023-06-10 ENCOUNTER — Ambulatory Visit: Payer: MEDICAID | Admitting: Physical Therapy

## 2023-06-11 ENCOUNTER — Telehealth: Payer: Self-pay | Admitting: Physician Assistant

## 2023-06-11 ENCOUNTER — Ambulatory Visit: Payer: MEDICAID

## 2023-06-11 NOTE — Telephone Encounter (Signed)
Pt's mother called asking for a refill on Ingrezza 80 mg capsules one time a day.  This was prescribed when he was at [facility] : [facility]  They want it sent to [facility] mail order.  CB@  [phone]

## 2023-06-12 ENCOUNTER — Telehealth: Payer: Self-pay | Admitting: Occupational Therapy

## 2023-06-12 ENCOUNTER — Ambulatory Visit: Payer: MEDICAID | Admitting: Occupational Therapy

## 2023-06-12 ENCOUNTER — Ambulatory Visit: Payer: MEDICAID | Admitting: Speech Pathology

## 2023-06-12 ENCOUNTER — Encounter: Payer: MEDICAID | Admitting: Occupational Therapy

## 2023-06-12 ENCOUNTER — Encounter: Payer: MEDICAID | Admitting: Speech Pathology

## 2023-06-12 NOTE — Telephone Encounter (Signed)
Pt. did not arrive for his OT appointment this morning. A follow-up phone call was made to touch base with the Pt. Pt. was encouraged to reach out to our clinic to schedule his next appointment when he is ready.

## 2023-06-12 NOTE — Telephone Encounter (Signed)
Called Brian Rios she stated that the pharmacy stated they didn't have any refills on file. Pt has been seeing a neurologist told her to call and see if they can send in a refill of medication. Told Brian Rios if she can't get a refill to call us back and let us know.  KP

## 2023-06-13 ENCOUNTER — Encounter: Payer: MEDICAID | Admitting: Speech Pathology

## 2023-06-13 ENCOUNTER — Other Ambulatory Visit: Payer: Self-pay

## 2023-06-17 ENCOUNTER — Encounter: Payer: MEDICAID | Admitting: Occupational Therapy

## 2023-06-17 ENCOUNTER — Ambulatory Visit: Payer: MEDICAID

## 2023-06-17 ENCOUNTER — Encounter: Payer: MEDICAID | Admitting: Speech Pathology

## 2023-06-19 ENCOUNTER — Encounter: Payer: MEDICAID | Admitting: Speech Pathology

## 2023-06-20 ENCOUNTER — Encounter: Payer: MEDICAID | Admitting: Speech Pathology

## 2023-06-25 ENCOUNTER — Ambulatory Visit: Payer: MEDICAID | Attending: Physician Assistant | Admitting: Physical Therapy

## 2023-06-25 ENCOUNTER — Ambulatory Visit: Payer: MEDICAID | Admitting: Occupational Therapy

## 2023-06-25 NOTE — Telephone Encounter (Signed)
Pt. did not show up for his therapy appointments this morning. The multiple attempts made to contact the Pt. via telephone were unsuccessful.

## 2023-06-26 ENCOUNTER — Ambulatory Visit: Payer: MEDICAID | Admitting: Physical Therapy

## 2023-06-26 ENCOUNTER — Encounter: Payer: MEDICAID | Admitting: Speech Pathology

## 2023-07-01 ENCOUNTER — Other Ambulatory Visit: Payer: Self-pay | Admitting: Physician Assistant

## 2023-07-01 ENCOUNTER — Encounter: Payer: MEDICAID | Admitting: Speech Pathology

## 2023-07-01 ENCOUNTER — Ambulatory Visit: Payer: MEDICAID

## 2023-07-01 DIAGNOSIS — Z8639 Personal history of other endocrine, nutritional and metabolic disease: Secondary | ICD-10-CM

## 2023-07-01 NOTE — Telephone Encounter (Signed)
Medication Refill - Medication: ARIPiprazole (ABILIFY) 30 MG tablet  atorvastatin (LIPITOR) 40 MG tablet  benztropine (COGENTIN) 1 MG tablet    divalproex (DEPAKOTE ER) 250 MG 24 hr tablet   escitalopram (LEXAPRO) 20 MG tablet  Lactobacillus (ACIDOPHILUS PO)   mirtazapine (REMERON) 45 MG tablet  valbenazine (INGREZZA) 80 MG capsule  ticagrelor (BRILINTA) 90 MG TABS tablet (this Rx was sent to the local [facility] pharmacy in [place] on 8/06. And she never picked this up. Please send to mailorder.  Has the patient contacted their pharmacy? Yes.    Preferred Pharmacy (with phone number or street name):  [facility] [place], [place] - [address], [place] [address]   Has the patient been seen for an appointment in the last year OR does the patient have an upcoming appointment? Yes.    Agent: Please be advised that RX refills may take up to 3 business days. We ask that you follow-up with your pharmacy.

## 2023-07-02 NOTE — Telephone Encounter (Signed)
Requested medication (s) are due for refill today: routing for review  Requested medication (s) are on the active medication list: yes  Last refill:  04/22/23  Future visit scheduled: yes  Notes to clinic:  Unable to refill per protocol, last refill by another provider. Routing to PCP for approval.     Requested Prescriptions  Pending Prescriptions Disp Refills   atorvastatin (LIPITOR) 40 MG tablet 30 tablet 0    Sig: Take 1 tablet (40 mg total) by mouth daily.     Cardiovascular:  Antilipid - Statins Failed - 07/01/2023 12:11 PM      Failed - Lipid Panel in normal range within the last 12 months    Cholesterol, Total  Date Value Ref Range Status  07/18/2022 156 100 - 199 mg/dL Final   Cholesterol  Date Value Ref Range Status  03/22/2023 113 0 - 200 mg/dL Final   LDL Chol Calc (NIH)  Date Value Ref Range Status  07/18/2022 87 0 - 99 mg/dL Final   LDL Cholesterol  Date Value Ref Range Status  03/22/2023 54 0 - 99 mg/dL Final    Comment:           Total Cholesterol/HDL:CHD Risk Coronary Heart Disease Risk Table                     Men   Women  1/2 Average Risk   3.4   3.3  Average Risk       5.0   4.4  2 X Average Risk   9.6   7.1  3 X Average Risk  23.4   11.0        Use the calculated Patient Ratio above and the CHD Risk Table to determine the patient's CHD Risk.        ATP III CLASSIFICATION (LDL):  <100     mg/dL   Optimal  742-595  mg/dL   Near or Above                    Optimal  130-159  mg/dL   Borderline  638-756  mg/dL   High  >433     mg/dL   Very High Performed at [facility], [address] [place], [place] [address]    HDL  Date Value Ref Range Status  03/22/2023 37 (L) >40 mg/dL Final  84/16/6063 51 >01 mg/dL Final   Triglycerides  Date Value Ref Range Status  03/22/2023 109 <150 mg/dL Final         Passed - Patient is not pregnant      Passed - Valid encounter within last 12 months    Recent Outpatient Visits           1  month ago Tobacco use disorder   [place] [facility] at [facility], [place] Al[place], [place]   2 months ago Hemiparesis affecting right side as late effect of cerebrovascular accident (CVA) [facility])   [place] [facility] at [facility], [place], [place]       Future Appointments             In 1 month Brian Rios, [place], [place] [facility] Primary Care & Sports Medicine at [facility], [facility]             benztropine (COGENTIN) 1 MG tablet      Sig: Take 1 tablet (1 mg total) by mouth at  bedtime.     Neurology: Parkinsonian Agents - benztropine mesylate Passed - 07/01/2023 12:11 PM      Passed - Last BP in normal range    BP Readings from Last 1 Encounters:  05/30/23 102/74         Passed - Last Heart Rate in normal range    Pulse Readings from Last 1 Encounters:  05/30/23 72         Passed - Valid encounter within last 12 months    Recent Outpatient Visits           1 month ago Tobacco use disorder   [facility] Medicine at [facility], [place], [place]   2 months ago Hemiparesis affecting right side as late effect of cerebrovascular accident (CVA) [facility])   [place] [facility] at [facility], [place], [place]       Future Appointments             In 1 month Waddell, [place], [place] [facility] Primary Care & Sports Medicine at [facility], [facility]             ARIPiprazole (ABILIFY) 30 MG tablet 30 tablet 0    Sig: Take 1 tablet (30 mg total) by mouth at bedtime.     Not Delegated - Psychiatry:  Antipsychotics - Second Generation (Atypical) - aripiprazole Failed - 07/01/2023 12:11 PM      Failed - This refill cannot be delegated      Failed - TSH in normal range and within 360 days    TSH  Date Value Ref Range Status  03/28/2021 4.040 0.450 - 4.500 uIU/mL Final         Failed - Lipid Panel in normal range within the last 12 months    Cholesterol,  Total  Date Value Ref Range Status  07/18/2022 156 100 - 199 mg/dL Final   Cholesterol  Date Value Ref Range Status  03/22/2023 113 0 - 200 mg/dL Final   LDL Chol Calc (NIH)  Date Value Ref Range Status  07/18/2022 87 0 - 99 mg/dL Final   LDL Cholesterol  Date Value Ref Range Status  03/22/2023 54 0 - 99 mg/dL Final    Comment:           Total Cholesterol/HDL:CHD Risk Coronary Heart Disease Risk Table                     Men   Women  1/2 Average Risk   3.4   3.3  Average Risk       5.0   4.4  2 X Average Risk   9.6   7.1  3 X Average Risk  23.4   11.0        Use the calculated Patient Ratio above and the CHD Risk Table to determine the patient's CHD Risk.        ATP III CLASSIFICATION (LDL):  <100     mg/dL   Optimal  578-469  mg/dL   Near or Above                    Optimal  130-159  mg/dL   Borderline  629-528  mg/dL   High  >413     mg/dL   Very High Performed at [facility], [address], [place], [place] [address]    HDL  Date Value Ref Range Status  03/22/2023 37 (L) >40 mg/dL Final  16/07/9603 51 >54 mg/dL Final   Triglycerides  Date Value Ref Range Status  03/22/2023 109 <150 mg/dL Final         Passed - Completed PHQ-2 or PHQ-9 in the last 360 days      Passed - Last BP in normal range    BP Readings from Last 1 Encounters:  05/30/23 102/74         Passed - Last Heart Rate in normal range    Pulse Readings from Last 1 Encounters:  05/30/23 72         Passed - Valid encounter within last 6 months    Recent Outpatient Visits           1 month ago Tobacco use disorder   [place] [facility] at [facility] [place], [place]   2 months ago Hemiparesis affecting right side as late effect of cerebrovascular accident (CVA) (HCC)   [place] [facility] at [facility], [place], [place]       Future Appointments             In 1 month Waddell, [place], [place] [facility]  Primary Care & Sports Medicine at [facility], [facility]            Passed - CBC within normal limits and completed in the last 12 months    WBC  Date Value Ref Range Status  03/28/2023 12.6 (H) 4.0 - 10.5 K/uL Final   RBC  Date Value Ref Range Status  03/28/2023 5.01 4.22 - 5.81 MIL/uL Final   Hemoglobin  Date Value Ref Range Status  03/28/2023 13.7 13.0 - 17.0 g/dL Final  09/81/1914 78.2 13.0 - 17.7 g/dL Final   HCT  Date Value Ref Range Status  03/28/2023 41.8 39.0 - 52.0 % Final   Hematocrit  Date Value Ref Range Status  07/18/2022 42.8 37.5 - 51.0 % Final   MCHC  Date Value Ref Range Status  03/28/2023 32.8 30.0 - 36.0 g/dL Final   [facility]  Date Value Ref Range Status  03/28/2023 27.3 26.0 - 34.0 pg Final   MCV  Date Value Ref Range Status  03/28/2023 83.4 80.0 - 100.0 fL Final  07/18/2022 82 79 - 97 fL Final  02/14/2015 83 80 - 100 fL Final   No results found for: "PLTCOUNTKUC", "LABPLAT", "POCPLA" RDW  Date Value Ref Range Status  03/28/2023 13.6 11.5 - 15.5 % Final  07/18/2022 13.6 11.6 - 15.4 % Final  02/14/2015 14.5 11.5 - 14.5 % Final         Passed - CMP within normal limits and completed in the last 12 months    Albumin  Date Value Ref Range Status  03/28/2023 2.8 (L) 3.5 - 5.0 g/dL Final  95/62/1308 3.9 (L) 4.1 - 5.1 g/dL Final  65/78/4696 4.7 g/dL Final    Comment:    2.9-5.2 NOTE: New reference range  12/28/14    Alkaline Phosphatase  Date Value Ref Range Status  03/28/2023 59 38 - 126 U/L Final  02/14/2015 62 U/L Final    Comment:    38-126 NOTE: New Reference Range  12/28/14    ALT  Date Value Ref Range Status  03/28/2023 34 0 - 44 U/L Final   SGPT (ALT)  Date Value Ref Range Status  02/14/2015 32 U/L Final    Comment:    17-63 NOTE: New Reference  Range  12/28/14    AST  Date Value Ref Range Status  03/28/2023 33 15 - 41 U/L Final   SGOT(AST)  Date Value Ref Range Status  02/14/2015 25 U/L Final    Comment:     15-41 NOTE: New Reference Range  12/28/14    BUN  Date Value Ref Range Status  03/28/2023 7 6 - 20 mg/dL Final  16/60/6301 12 6 - 24 mg/dL Final  60/07/9322 12 mg/dL Final    Comment:    5-57 NOTE: New Reference Range  12/28/14    Calcium  Date Value Ref Range Status  03/28/2023 9.0 8.9 - 10.3 mg/dL Final   Calcium, Total  Date Value Ref Range Status  02/14/2015 9.3 mg/dL Final    Comment:    3.2-20.2 NOTE: New Reference Range  12/28/14    Calcium, Ion  Date Value Ref Range Status  03/21/2023 1.13 (L) 1.15 - 1.40 mmol/L Final   CO2  Date Value Ref Range Status  03/28/2023 23 22 - 32 mmol/L Final   Co2  Date Value Ref Range Status  02/14/2015 21 (L) mmol/L Final    Comment:    22-32 NOTE: New Reference Range  12/28/14    Bicarbonate  Date Value Ref Range Status  03/21/2023 17.6 (L) 20.0 - 28.0 mmol/L Final   TCO2  Date Value Ref Range Status  03/21/2023 19 (L) 22 - 32 mmol/L Final   Creatinine  Date Value Ref Range Status  02/14/2015 0.88 mg/dL Final    Comment:    5.42-7.06 NOTE: New Reference Range  12/28/14    Creatinine, Ser  Date Value Ref Range Status  03/28/2023 0.96 0.61 - 1.24 mg/dL Final   Glucose  Date Value Ref Range Status  02/14/2015 85 mg/dL Final    Comment:    23-76 NOTE: New Reference Range  12/28/14    Glucose, Bld  Date Value Ref Range Status  03/28/2023 96 70 - 99 mg/dL Final    Comment:    Glucose reference range applies only to samples taken after fasting for at least 8 hours.   Glucose-Capillary  Date Value Ref Range Status  03/28/2023 115 (H) 70 - 99 mg/dL Final    Comment:    Glucose reference range applies only to samples taken after fasting for at least 8 hours.   Potassium  Date Value Ref Range Status  03/28/2023 3.9 3.5 - 5.1 mmol/L Final  02/14/2015 3.7 mmol/L Final    Comment:    3.5-5.1 NOTE: New Reference Range  12/28/14    Sodium  Date Value Ref Range Status  03/28/2023 137 135 - 145  mmol/L Final  07/18/2022 138 134 - 144 mmol/L Final  02/14/2015 137 mmol/L Final    Comment:    135-145 NOTE: New Reference Range  12/28/14    Total Bilirubin  Date Value Ref Range Status  03/28/2023 0.6 0.3 - 1.2 mg/dL Final   Bilirubin,Total  Date Value Ref Range Status  02/14/2015 0.3 mg/dL Final    Comment:    2.8-3.1 NOTE: New Reference Range  12/28/14    Bilirubin Total  Date Value Ref Range Status  07/18/2022 <0.2 0.0 - 1.2 mg/dL Final   Protein, ur  Date Value Ref Range Status  03/23/2023 NEGATIVE NEGATIVE mg/dL Final   Total Protein  Date Value Ref Range Status  03/28/2023 6.8 6.5 - 8.1 g/dL Final  51/76/1607 5.9 (L) 6.0 - 8.5 g/dL Final  37/07/6268 8.3 (H) g/dL Final  Comment:    6.5-8.1 NOTE: New Reference Range  12/28/14    EGFR (African American)  Date Value Ref Range Status  02/14/2015 >60  Final   GFR calc Af Amer  Date Value Ref Range Status  07/17/2020 >60 >60 mL/min Final   eGFR  Date Value Ref Range Status  07/18/2022 102 >59 mL/min/1.73 Final   EGFR (Non-African Amer.)  Date Value Ref Range Status  02/14/2015 >60  Final    Comment:    eGFR values <19mL/min/1.73 m2 may be an indication of chronic kidney disease (CKD). Calculated eGFR is useful in patients with stable renal function. The eGFR calculation will not be reliable in acutely ill patients when serum creatinine is changing rapidly. It is not useful in patients on dialysis. The eGFR calculation may not be applicable to patients at the low and high extremes of body sizes, pregnant women, and vegetarians.    GFR, Estimated  Date Value Ref Range Status  03/28/2023 >60 >60 mL/min Final    Comment:    (NOTE) Calculated using the CKD-EPI Creatinine Equation (2021)           divalproex (DEPAKOTE ER) 250 MG 24 hr tablet 90 tablet 0    Sig: Take 3 tablets (750 mg total) by mouth at bedtime.     Neurology:  Anticonvulsants - Valproates Failed - 07/01/2023 12:11 PM       Failed - WBC in normal range and within 360 days    WBC  Date Value Ref Range Status  03/28/2023 12.6 (H) 4.0 - 10.5 K/uL Final         Failed - Valproic Acid (serum) in normal range and within 360 days    Valproic Acid Lvl  Date Value Ref Range Status  07/18/2022 18 (L) 50 - 100 ug/mL Final    Comment:                                    Detection Limit = 4                            <4 indicates None Detected Toxicity may occur at levels of 100-500. Measurements of free unbound valproic acid may improve the assess- ment of clinical response.          Passed - AST in normal range and within 360 days    AST  Date Value Ref Range Status  03/28/2023 33 15 - 41 U/L Final   SGOT(AST)  Date Value Ref Range Status  02/14/2015 25 U/L Final    Comment:    15-41 NOTE: New Reference Range  12/28/14          Passed - ALT in normal range and within 360 days    ALT  Date Value Ref Range Status  03/28/2023 34 0 - 44 U/L Final   SGPT (ALT)  Date Value Ref Range Status  02/14/2015 32 U/L Final    Comment:    17-63 NOTE: New Reference Range  12/28/14          Passed - HGB in normal range and within 360 days    Hemoglobin  Date Value Ref Range Status  03/28/2023 13.7 13.0 - 17.0 g/dL Final  82/95/6213 08.6 13.0 - 17.7 g/dL Final         Passed - PLT in normal range and within 360 days  Platelets  Date Value Ref Range Status  03/28/2023 329 150 - 400 K/uL Final  07/18/2022 229 150 - 450 x10E3/uL Final         Passed - HCT in normal range and within 360 days    HCT  Date Value Ref Range Status  03/28/2023 41.8 39.0 - 52.0 % Final   Hematocrit  Date Value Ref Range Status  07/18/2022 42.8 37.5 - 51.0 % Final         Passed - Completed PHQ-2 or PHQ-9 in the last 360 days      Passed - Patient is not pregnant      Passed - Valid encounter within last 12 months    Recent Outpatient Visits           1 month ago Tobacco use disorder   [place] [facility] at [facility], [place]   2 months ago Hemiparesis affecting right side as late effect of cerebrovascular accident (CVA) (HCC)   [place] [facility] at [facility] Brian Rios, [place], [place]       Future Appointments             In 1 month Waddell, [place], [place] [facility] at [facility], [facility]             escitalopram (LEXAPRO) 20 MG tablet 45 tablet 0    Sig: Take 1.5 tablets (30 mg total) by mouth daily.     Psychiatry:  Antidepressants - SSRI Passed - 07/01/2023 12:11 PM      Passed - Completed PHQ-2 or PHQ-9 in the last 360 days      Passed - Valid encounter within last 6 months    Recent Outpatient Visits           1 month ago Tobacco use disorder   [place] [facility] at [facility], [place]   2 months ago Hemiparesis affecting right side as late effect of cerebrovascular accident (CVA) [facility])   [place] [facility] at [facility], [place], [place]       Future Appointments             In 1 month Brian Rios, [place], [place] [facility] Primary Care & Sports Medicine at [facility], [facility]             Lactobacillus (ACIDOPHILUS) CAPS capsule      Sig: Take by mouth in the morning and at bedtime.     Endocrinology:  Nutritional Agents Passed - 07/01/2023 12:11 PM      Passed - Valid encounter within last 12 months    Recent Outpatient Visits           1 month ago Tobacco use disorder   [facility] & [facility] Medicine at [facility], [place], [place]   2 months ago Hemiparesis affecting right side as late effect of cerebrovascular accident (CVA) [facility])   [facility] at [facility], [place], [place]       Future Appointments             In 1 month Brian Rios, [place], [place] [facility] Primary Care & Sports Medicine at [facility], [facility]              mirtazapine (REMERON) 45 MG tablet 30 tablet  0    Sig: Take 1 tablet (45 mg total) by mouth at bedtime.     Psychiatry: Antidepressants - mirtazapine Passed - 07/01/2023 12:11 PM      Passed - Completed PHQ-2 or PHQ-9 in the last 360 days      Passed - Valid encounter within last 6 months    Recent Outpatient Visits           1 month ago Tobacco use disorder   [place] [facility] at [facility], [place]   2 months ago Hemiparesis affecting right side as late effect of cerebrovascular accident (CVA) [facility])   [place] [facility] at [facility], [place], [place]       Future Appointments             In 1 month Brian Rios, [place], [place] [facility] Primary Care & Sports Medicine at [facility], [facility]             valbenazine [facility]) 80 MG capsule 30 capsule     Sig: Take 1 capsule (80 mg total) by mouth daily.     Off-Protocol Failed - 07/01/2023 12:11 PM      Failed - Medication not assigned to a protocol, review manually.      Passed - Valid encounter within last 12 months    Recent Outpatient Visits           1 month ago Tobacco use disorder   [place] [facility] at [facility], [place], [place]   2 months ago Hemiparesis affecting right side as late effect of cerebrovascular accident (CVA) [facility])   [place] [facility] at [facility], [place], [place]       Future Appointments             In 1 month Brian Rios, [place], [place] [facility] Primary Care & Sports Medicine at [facility], [facility]             ticagrelor (BRILINTA) 90 MG TABS tablet 180 tablet 0    Sig: Take 1 tablet (90 mg total) by mouth 2 (two) times daily.     Hematology: Antiplatelets - ticagrelor Passed - 07/01/2023 12:11 PM      Passed - Cr in normal range and within 180 days    Creatinine  Date Value Ref Range Status  02/14/2015 0.88 mg/dL Final     Comment:    8.11-9.14 NOTE: New Reference Range  12/28/14    Creatinine, Ser  Date Value Ref Range Status  03/28/2023 0.96 0.61 - 1.24 mg/dL Final         Passed - HCT in normal range and within 180 days    HCT  Date Value Ref Range Status  03/28/2023 41.8 39.0 - 52.0 % Final   Hematocrit  Date Value Ref Range Status  07/18/2022 42.8 37.5 - 51.0 % Final         Passed - HGB in normal range and within 180 days    Hemoglobin  Date Value Ref Range Status  03/28/2023 13.7 13.0 - 17.0 g/dL Final  78/29/5621 30.8 13.0 - 17.7 g/dL Final         Passed - PLT in normal range and within 180 days    Platelets  Date Value Ref Range Status  03/28/2023 329 150 - 400 K/uL Final  07/18/2022 229 150 - 450 x10E3/uL  Final         Passed - Last Heart Rate in normal range    Pulse Readings from Last 1 Encounters:  05/30/23 72         Passed - Valid encounter within last 6 months    Recent Outpatient Visits           1 month ago Tobacco use disorder   [place] [facility] at [facility], [place], [place]   2 months ago Hemiparesis affecting right side as late effect of cerebrovascular accident (CVA) [facility])   [facility] Primary Care & Sports Medicine at [facility], [place], [place]       Future Appointments             In 1 month Waddell, [place], [place] [facility] Primary Care & Sports Medicine at [facility], [facility]

## 2023-07-02 NOTE — Telephone Encounter (Signed)
Refill? Never filled by you, New patient appointment 05/02/23

## 2023-07-03 ENCOUNTER — Ambulatory Visit: Payer: MEDICAID | Admitting: Speech Pathology

## 2023-07-03 ENCOUNTER — Ambulatory Visit: Payer: MEDICAID | Admitting: Physical Therapy

## 2023-07-03 ENCOUNTER — Telehealth: Payer: Self-pay | Admitting: Occupational Therapy

## 2023-07-03 ENCOUNTER — Encounter: Payer: MEDICAID | Admitting: Speech Pathology

## 2023-07-03 ENCOUNTER — Ambulatory Visit: Payer: MEDICAID | Admitting: Occupational Therapy

## 2023-07-03 MED ORDER — DIVALPROEX SODIUM ER 250 MG PO TB24: 750.0000 mg | ORAL_TABLET | Freq: Every day | ORAL | 0 refills | Status: DC

## 2023-07-03 MED ORDER — ACIDOPHILUS PO CAPS: 1.0000 | ORAL_CAPSULE | Freq: Two times a day (BID) | ORAL | 3 refills | Status: DC

## 2023-07-03 MED ORDER — ARIPIPRAZOLE 30 MG PO TABS: 30.0000 mg | ORAL_TABLET | Freq: Every day | ORAL | 1 refills | Status: DC

## 2023-07-03 MED ORDER — MIRTAZAPINE 45 MG PO TABS: 45.0000 mg | ORAL_TABLET | Freq: Every day | ORAL | 1 refills | Status: DC

## 2023-07-03 MED ORDER — BENZTROPINE MESYLATE 1 MG PO TABS: 1.0000 mg | ORAL_TABLET | Freq: Every day | ORAL | 0 refills | Status: DC

## 2023-07-03 MED ORDER — VALBENAZINE TOSYLATE 80 MG PO CAPS: 80.0000 mg | ORAL_CAPSULE | Freq: Every day | ORAL | 0 refills | Status: AC

## 2023-07-03 MED ORDER — ATORVASTATIN CALCIUM 40 MG PO TABS
40.0000 mg | ORAL_TABLET | Freq: Every day | ORAL | 3 refills | Status: DC
Start: 2023-07-03 — End: 2023-12-26

## 2023-07-03 MED ORDER — ESCITALOPRAM OXALATE 20 MG PO TABS: 20.0000 mg | ORAL_TABLET | Freq: Every day | ORAL | 1 refills | Status: DC

## 2023-07-03 NOTE — Telephone Encounter (Signed)
Pt hasn't seen neurology. Pt had an appt today. His mother said transportation did not come to pick him up. She stated they are going to call back to reschedule.  KP

## 2023-07-03 NOTE — Telephone Encounter (Signed)
Pt. did not show up for his therapy appointments this morning. A follow-up phone call was made.  Pt.'s mother indicated that transportation did not show up for the Pt. this morning.

## 2023-07-03 NOTE — Telephone Encounter (Signed)
Brilinta refill denied because it was filled last month. Escitalopram dose adjusted to 20 mg daily which is max recommended dose. Will need psych and neuro to take over some of these prescriptions in the near future.

## 2023-07-05 ENCOUNTER — Other Ambulatory Visit: Payer: Self-pay

## 2023-07-05 NOTE — Patient Outreach (Signed)
Telephone outreach to patient to obtain mRS was successfully completed. MRS= 3  Brian Rios [facility] Care Management Assistant [phone]

## 2023-07-08 ENCOUNTER — Encounter: Payer: MEDICAID | Admitting: Speech Pathology

## 2023-07-08 ENCOUNTER — Ambulatory Visit: Payer: MEDICAID | Admitting: Physical Therapy

## 2023-07-10 ENCOUNTER — Ambulatory Visit: Payer: MEDICAID | Admitting: Physical Therapy

## 2023-07-10 ENCOUNTER — Encounter: Payer: MEDICAID | Admitting: Speech Pathology

## 2023-07-10 ENCOUNTER — Telehealth: Payer: Self-pay | Admitting: Physician Assistant

## 2023-07-10 NOTE — Telephone Encounter (Signed)
Called pts mother Lupita Leash told her that I did not see where anyone has called from the office. Asked her was she able to schedule the neurology appointment. She stated no. Told her they may be the office that called to try to call and reschedule the missed appt. She verbalized understanding.  KP

## 2023-07-10 NOTE — Telephone Encounter (Signed)
Copied from CRM [id number]. Topic: General - Inquiry >> Jul 10, 2023 12:14 PM Payton Doughty wrote: Reason for CRM: mom called b/c she said she got a call from the office to reschedule an appt.  But I do not see anything.  Please advise and call Mom Lupita Leash. thanks

## 2023-07-15 ENCOUNTER — Telehealth: Payer: Self-pay | Admitting: Physician Assistant

## 2023-07-15 ENCOUNTER — Telehealth: Payer: Self-pay | Admitting: Speech Pathology

## 2023-07-15 ENCOUNTER — Encounter: Payer: MEDICAID | Admitting: Speech Pathology

## 2023-07-15 ENCOUNTER — Ambulatory Visit: Payer: MEDICAID | Admitting: Occupational Therapy

## 2023-07-15 ENCOUNTER — Ambulatory Visit: Payer: MEDICAID

## 2023-07-15 ENCOUNTER — Ambulatory Visit: Payer: MEDICAID | Admitting: Physical Therapy

## 2023-07-15 ENCOUNTER — Ambulatory Visit: Payer: MEDICAID | Admitting: Speech Pathology

## 2023-07-15 NOTE — Telephone Encounter (Signed)
Please review.  KP

## 2023-07-15 NOTE — Telephone Encounter (Signed)
Copied from CRM [id number]. Topic: General - Other >> Jul 15, 2023  3:10 PM Everette C wrote: Reason for CRM: The patient's mother has called to request orders for home health PT for the patient   Please contact the patient further when possible

## 2023-07-15 NOTE — Telephone Encounter (Signed)
Brian Rios was a no show/no call today. I attempted to reach his mother and left a voice mail stating that outpatient services would be canceled at this time and she could reach out to Brian Rios's attending for referral to home health services.   Of note, last week this Clinical research associate spoke with Brian Rios's mother and also reached out to [employer]. This writer scheduled transportation for this patient to today's schedule. Separate phone call made to Brian Rios's mother informing her of this appt. She confirmed.   See below for confirmation information from [place] [facility]: 07/15/2023 - Brian Rios to be picked up 15 minutes before/after time of 1:45pm from his house and transported to [place] for a 2:45pm appt with me and then 3:30pm appt with Consuella Lose - because they will leave him, I set a pickup time upstairs at 4:30pm (to allow [place] time to get him upstairs) for him to be transported home - confirmation [phone]  09/25 - same times as 09/23 - confirmation #[id number]  09/30 - Brian Rios to be picked up 15 minutes before/after time of 12:15pm from his house and transported to [place] for a 1:15pm appt with Consuella Lose and a 2pm appt with me - because they will leave him, I set a pickup time upstairs at 3pm to allow [place] to get him upstairs to be transported home - confirmation #[id number]   10/02 - same times as 09/23 & 09/25 - confirmation #[id number]   Aarianna Hoadley B. Dreama Saa, M.S., CCC-SLP, Tree surgeon Certified Brain Injury Specialist [facility]  [facility] Office [phone] Ascom [phone] Fax [phone]

## 2023-07-16 ENCOUNTER — Other Ambulatory Visit: Payer: Self-pay | Admitting: Physician Assistant

## 2023-07-16 DIAGNOSIS — I69351 Hemiplegia and hemiparesis following cerebral infarction affecting right dominant side: Secondary | ICD-10-CM

## 2023-07-16 DIAGNOSIS — F809 Developmental disorder of speech and language, unspecified: Secondary | ICD-10-CM

## 2023-07-17 ENCOUNTER — Encounter: Payer: MEDICAID | Admitting: Speech Pathology

## 2023-07-17 ENCOUNTER — Encounter: Payer: MEDICAID | Admitting: Occupational Therapy

## 2023-07-17 ENCOUNTER — Ambulatory Visit: Payer: MEDICAID | Admitting: Physical Therapy

## 2023-07-17 NOTE — Telephone Encounter (Signed)
Called mother she is aware and verbalized understanding.  KP

## 2023-07-22 ENCOUNTER — Encounter: Payer: MEDICAID | Admitting: Occupational Therapy

## 2023-07-22 ENCOUNTER — Ambulatory Visit: Payer: MEDICAID | Admitting: Physical Therapy

## 2023-07-22 ENCOUNTER — Encounter: Payer: MEDICAID | Admitting: Speech Pathology

## 2023-07-22 ENCOUNTER — Telehealth: Payer: Self-pay

## 2023-07-22 NOTE — Telephone Encounter (Signed)
Called received 9/28 after hours with complaints of SOB and difficulty breathing. Pt needs to go to ER.  KP

## 2023-07-24 ENCOUNTER — Ambulatory Visit: Payer: MEDICAID | Admitting: Speech Pathology

## 2023-07-24 ENCOUNTER — Ambulatory Visit: Payer: MEDICAID

## 2023-07-24 ENCOUNTER — Encounter: Payer: MEDICAID | Admitting: Speech Pathology

## 2023-07-24 ENCOUNTER — Encounter: Payer: MEDICAID | Attending: Physical Medicine and Rehabilitation | Admitting: Physical Medicine and Rehabilitation

## 2023-07-24 VITALS — Ht 70.0 in | Wt 200.0 lb

## 2023-07-24 DIAGNOSIS — R252 Cramp and spasm: Secondary | ICD-10-CM | POA: Diagnosis not present

## 2023-07-24 DIAGNOSIS — F809 Developmental disorder of speech and language, unspecified: Secondary | ICD-10-CM

## 2023-07-24 DIAGNOSIS — I63412 Cerebral infarction due to embolism of left middle cerebral artery: Secondary | ICD-10-CM

## 2023-07-24 DIAGNOSIS — F0631 Mood disorder due to known physiological condition with depressive features: Secondary | ICD-10-CM

## 2023-07-24 DIAGNOSIS — I639 Cerebral infarction, unspecified: Secondary | ICD-10-CM

## 2023-07-24 DIAGNOSIS — G8191 Hemiplegia, unspecified affecting right dominant side: Secondary | ICD-10-CM | POA: Diagnosis not present

## 2023-07-24 NOTE — Progress Notes (Signed)
Subjective:    Patient ID: Brian Rios, male    DOB: 08/03/1981, 42 y.o.   MRN: [id number]  HPI Virtual Visit via Video Note  I connected with Brian Rios on 07/24/23 at 11:00 AM EDT by telephone and verified that I am speaking with the correct person using two identifiers.  Location: Patient: home Provider: office   I discussed the limitations of evaluation and management by telemedicine and the availability of in person appointments. The patient expressed understanding and agreed to proceed.    The patient was advised to call back or seek an in-person evaluation if the symptoms worsen or if the condition fails to improve as anticipated.  I provided 18 minutes of non-face-to-face time during this encounter. An additional 5 minutes of chart review for medical history was performed prior to the call.    Brian Rios is a 42 y.o. 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   They are presenting to PM&R clinic for follow up related to IPR admission for L MCA stroke 6/5-6/19 .  Marland Kitchen  Plan from last visit:  Cerebrovascular accident (CVA) due to embolism of left middle cerebral artery (HCC) Continue to take 1 over-the-counter aspirin 81 mg daily and Brilinta 900 mg twice daily for stroke prevention; I will refill your Brilinta today   Follow-up with [facility] Dr. Pearlean Brownie for neurology; you will need to call their office to make this appointment   Follow-up as instructed with interventional radiology   Follow-up with me in 3 months; we can do this via telehealth if desired.   Right hemiparesis (HCC) Speech and language deficits I will discuss with social work regarding what happened to referrals for PT, OT, and speech therapy.  Please call our office  and let me know if you end up getting new referrals with your primary care doctor for these therapies.   Continue to take your time and be careful when moving, twisting, or doing sudden movements to prevent falls.     Spasticity You are starting to develop some tightness in your right hand and wrist.  I will give you information today on things to look out for moving forward that would constitute an increase in tone that may require injections for treatment.   In the meantime, I am starting you on baclofen 5 mg 3 times daily as needed for pain and tightness in your wrist.     Chronic diarrhea You are [facility], [facility], [facility].  Please call their office and schedule follow-up.  I am sending a new prescription for your prior medication of Imodium 4 mg 3 times daily.  Also continue daily Metamucil and high-fiber diet.     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  I am also refilling your Depakote.  Please have your primary care doctor take over these medications on follow-up.  Please call our office or let me know if you have any side effects to the increased Lexapro.   Interval Hx:  - Therapies: Has not seen any; he was in speech therapy for one day. Note Hx transportation difficulties due to mom having to drive him in the past. He denies any need for PT but does agree hand therapy /  OT would be beneficial, along with speech therapy if it can be managed.    - Follow ups: Has seen Dr. Mordecai Maes since last visit; does not think he needs any medication refills. He sees psych every 3 months to manage his medications. He hasn't seen any other specialists since last visit.    - Falls:none recently   - DME: none   - Medications: Taking baclofen, have not noticed difference in right hand tightness. He can't grab a fork due to mobility issues with the hand. He is right hand dominant.    -  Other concerns: Mood is good; depression overall still severe. He did not notice a difference with increase in Lexapro. He has been seeing Dr Georjean Mode for psychiatry every 2-3 months.   Pain Inventory Average Pain  no pain Pain Right Now  no pain My pain is  no pain  LOCATION OF PAIN  no pain  BOWEL Number of stools per week: 7 Oral laxative use Yes  Type of laxative miralax Enema or suppository use No  History of colostomy No  Incontinent No   BLADDER Normal In and out cath, frequency . Able to self cath  . Bladder incontinence No  Frequent urination No  Leakage with coughing No  Difficulty starting stream No  Incomplete bladder emptying No    Mobility how many minutes can you walk? 10-20 ability to climb steps?  yes do you drive?  no  Function disabled: date disabled . I need assistance with the following:  meal prep, household duties, and shopping  Neuro/Psych numbness confusion depression  Prior Studies Any changes since last visit?  no  Physicians involved in your care Any changes since last visit?  no   Family History  Problem Relation Age of Onset   Alcoholism Mother    Hypertension Mother    Breast cancer Mother    Alcoholism Father    Stroke Maternal Grandmother    Hypertension Maternal Grandmother    Diabetes Maternal Grandmother    Social History   Socioeconomic History   Marital status: Single    Spouse name: Not on file   Number of children: 0   Years of education: Not on file   Highest education level: Not on file  Occupational History   Not on file  Tobacco Use   Smoking status: Every Day    Current packs/day: 1.00    Average packs/day: 1 pack/day for 15.0 years (15.0 ttl pk-yrs)    Types: Cigarettes    Passive exposure: Current   Smokeless tobacco: Never  Vaping Use   Vaping status: Never Used  Substance and Sexual Activity   Alcohol use: Yes    Alcohol/week: 6.0 standard drinks of alcohol    Types: 6 Cans of beer per week    Drug use: Yes    Frequency: 7.0 times per week    Types: "Crack" cocaine, Marijuana    Comment: Marijuana daily. USED 6 MONTHS AGO CRACK (2023)   Sexual activity: Yes    Birth control/protection: None  Other Topics Concern   Not on file  Social History Narrative   Lives with mother   Social Determinants of Health   Financial Resource Strain: Not on file  Food Insecurity: Food Insecurity Present (06/12/2022)   Hunger Vital Sign    Worried About Running Out of Food in the Last Year: Often true    Ran Out of Food in the Last Year: Often true  Transportation Needs: Unmet Transportation Needs (06/12/2022)  PRAPARE - Administrator, Civil Service (Medical): No    Lack of Transportation (Non-Medical): Yes  Physical Activity: Not on file  Stress: Not on file  Social Connections: Not on file   Past Surgical History:  Procedure Laterality Date   BOWEL RESECTION N/A 09/04/2022   Procedure: SMALL BOWEL RESECTION, open;  Surgeon: Leafy Ro, MD;  Location: [facility] ORS;  Service: General;  Laterality: N/A;   IR CT HEAD LTD  03/21/2023   IR CT HEAD LTD  03/21/2023   IR PERCUTANEOUS ART THROMBECTOMY/INFUSION INTRACRANIAL INC DIAG ANGIO  03/21/2023   ORIF ORBITAL FRACTURE  1985   PARTIAL COLECTOMY Right 09/04/2022   Procedure: PARTIAL COLECTOMY;  Surgeon: Leafy Ro, MD;  Location: [facility] ORS;  Service: General;  Laterality: Right;  Right colectimy   RADIOLOGY WITH ANESTHESIA N/A 03/21/2023   Procedure: RADIOLOGY WITH ANESTHESIA;  Surgeon: Radiologist, Medication, MD;  Location: [facility] OR;  Service: Radiology;  Laterality: N/A;   TEE WITHOUT CARDIOVERSION N/A 07/19/2020   Procedure: TRANSESOPHAGEAL ECHOCARDIOGRAM (TEE);  Surgeon: Lamar Blinks, MD;  Location: [facility] ORS;  Service: Cardiovascular;  Laterality: N/A;   WRIST SURGERY Left 11/07/2013   REPAIR ULNAR ARTERY, NERVE AND TENDON   Past Medical History:  Diagnosis Date   Alcohol abuse    Depression    Drug abuse (HCC)     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   left cerebrum   Erectile dysfunction    Heart murmur    Hyperlipidemia    Schizophrenia (HCC)    Tobacco abuse    Ht 5\' 10"  (1.778 m)   Wt 200 lb (90.7 kg)   BMI 28.70 kg/m   Opioid Risk Score:   Fall Risk Score:  `1  Depression screen [facility] 2/9     05/30/2023   10:45 AM 05/02/2023   10:02 AM 04/22/2023   10:56 AM 06/12/2022   12:29 PM  Depression screen PHQ 2/9  Decreased Interest 0 0 0 2  Down, Depressed, Hopeless 2 0 3 3  PHQ - 2 Score 2 0 3 5  Altered sleeping 0 0 0 3  Tired, decreased energy 0 0 3 3  Change in appetite 3 3 0 3  Feeling bad or failure about yourself  2 0 0 3  Trouble concentrating 2 3 3  0  Moving slowly or fidgety/restless 2 3 3 2   Suicidal thoughts 0 0 0 3  PHQ-9 Score 11 9 12 22   Difficult doing work/chores Very difficult Extremely dIfficult Somewhat difficult      Review of Systems  Neurological:  Positive for numbness.  All other systems reviewed and are negative.     Objective:   Physical Exam   Mild ongoing dysarthria and minimal difficulty wordfinding; 80% intelligable Appropriate mood and affect, appropriate thought content     Assessment & Plan:   Brian Rios is a 41 y.o. 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   Telehealth visit performed for follow up related to IPR admission for L MCA stroke 6/5-6/19 . Ongoing deficits in speech, RUE motor strength/coordination, spasticity, and mood/behavioral deficits (which were pre-existing).  Cerebrovascular accident (CVA) due to embolism of left middle cerebral artery (HCC) Right hemiparesis (HCC)  Patient was unable to reliably make patient therapy appointments due to transportation difficulties, agreeable to  home health evaluation  and treatments with speech therapy if available and OT for ongoing difficulties using his right hand.  Order placed.  PT deferred for now due to patient stating he has no current mobility deficits, can stand and walk unassisted.  Speech and language deficits  Will try for home health speech evaluation as above; if this is not available, we will try outpatient services again or look for home telephone or telehealth services.  Spasticity  Follow-up with our office in 2 months to evaluate for Botox to the right hand, given ongoing difficulty with tightness.  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 Georjean Mode, will defer management to their office

## 2023-07-25 ENCOUNTER — Encounter: Payer: MEDICAID | Admitting: Speech Pathology

## 2023-07-25 ENCOUNTER — Ambulatory Visit: Payer: MEDICAID

## 2023-07-29 ENCOUNTER — Encounter: Payer: MEDICAID | Admitting: Speech Pathology

## 2023-07-29 ENCOUNTER — Ambulatory Visit: Payer: MEDICAID

## 2023-07-29 ENCOUNTER — Ambulatory Visit: Payer: MEDICAID | Admitting: Speech Pathology

## 2023-07-29 ENCOUNTER — Ambulatory Visit: Payer: MEDICAID | Admitting: Physical Therapy

## 2023-07-31 ENCOUNTER — Telehealth: Payer: Self-pay | Admitting: Physical Medicine and Rehabilitation

## 2023-07-31 ENCOUNTER — Ambulatory Visit: Payer: MEDICAID

## 2023-07-31 ENCOUNTER — Encounter: Payer: MEDICAID | Admitting: Speech Pathology

## 2023-07-31 ENCOUNTER — Ambulatory Visit: Payer: MEDICAID | Admitting: Speech Pathology

## 2023-07-31 NOTE — Telephone Encounter (Signed)
Pt mother stated pt's PCP wanted him to start on home health services for PT and speech therapy. However, she has not heard from anyone.She stated she has been answering the phone even when she doesn't know the number as she was advised.  Please advise.

## 2023-07-31 NOTE — Telephone Encounter (Signed)
Left a voicemail about home health referral.  The only agency that I could find that takes his insurance was [facility].  They can't accept the patient due to staffing issues.  If patient wants to do outpatient therapy Dr. Shearon Stalls would need to put a new referral in for outpatient therapy.

## 2023-07-31 NOTE — Telephone Encounter (Signed)
Called pts mother let her know that our referral coordinator could not find any Home Health that takes his insurance. He will either need to shop around and call various home health agencies to ask if they'll accept his insurance or plan transportation to get him to [facility] appointments for physical therapy. She verbalized understanding and will let us know if she finds someone who takes his insurance.  KP

## 2023-08-01 ENCOUNTER — Encounter: Payer: MEDICAID | Admitting: Speech Pathology

## 2023-08-01 ENCOUNTER — Ambulatory Visit: Payer: MEDICAID | Admitting: Physical Therapy

## 2023-08-05 ENCOUNTER — Ambulatory Visit: Payer: MEDICAID | Admitting: Speech Pathology

## 2023-08-05 ENCOUNTER — Ambulatory Visit: Payer: MEDICAID | Admitting: Physical Therapy

## 2023-08-05 ENCOUNTER — Encounter: Payer: MEDICAID | Admitting: Speech Pathology

## 2023-08-05 ENCOUNTER — Ambulatory Visit: Payer: MEDICAID | Admitting: Occupational Therapy

## 2023-08-06 ENCOUNTER — Other Ambulatory Visit: Payer: Self-pay | Admitting: Physician Assistant

## 2023-08-07 ENCOUNTER — Encounter: Payer: MEDICAID | Admitting: Speech Pathology

## 2023-08-07 ENCOUNTER — Other Ambulatory Visit: Payer: Self-pay

## 2023-08-08 ENCOUNTER — Telehealth: Payer: Self-pay | Admitting: Physician Assistant

## 2023-08-08 ENCOUNTER — Ambulatory Visit: Payer: MEDICAID | Admitting: Physical Therapy

## 2023-08-08 ENCOUNTER — Encounter: Payer: MEDICAID | Admitting: Speech Pathology

## 2023-08-08 ENCOUNTER — Other Ambulatory Visit: Payer: Self-pay

## 2023-08-08 NOTE — Telephone Encounter (Signed)
Copied from CRM [id number]. Topic: General - Other >> Aug 08, 2023 10:25 AM Franchot Heidelberg wrote: Reason for CRM: Needs an order, demographics, and office visit notes for home health. [facility]   Needs PT, OT, Speech  Fax: [phone]  Pt's mother Morley Kos called requesting this

## 2023-08-08 NOTE — Telephone Encounter (Signed)
Reached out to referrals team to change the referral to that location.  KP

## 2023-08-12 ENCOUNTER — Encounter: Payer: MEDICAID | Admitting: Speech Pathology

## 2023-08-12 ENCOUNTER — Ambulatory Visit: Payer: MEDICAID | Admitting: Physical Therapy

## 2023-08-12 ENCOUNTER — Encounter: Payer: MEDICAID | Admitting: Occupational Therapy

## 2023-08-13 ENCOUNTER — Ambulatory Visit: Payer: Self-pay

## 2023-08-13 NOTE — Telephone Encounter (Signed)
Chief Complaint: Fall multiple Symptoms: none Frequency: Last fall last night Pertinent Negatives: Patient denies symptoms Disposition: [] ED /[] Urgent Care (no appt availability in office) / [x] Appointment(In office/virtual)/ []  [facility]/ [] Home Care/ [] Refused Recommended Disposition /[] [facility]/ []  Follow-up with PCP Additional Notes: Lupita Leash, mother of patient, called to report multiple falls, no injuries reported, she says she wants him checked out for this. She says he will need a 3 day notice for appointments. Advised this week on Thursday is the soonest, she agreed to that appointment.   Reason for Disposition  MILD weakness (i.e., does not interfere with ability to work, go to school, normal activities)  (Exception: Mild weakness is a chronic symptom.)  Answer Assessment - Initial Assessment Questions 1. MECHANISM: "How did the fall happen?"     I don't know, heard him fall and saw him getting up out the floor 2. DOMESTIC VIOLENCE AND ELDER ABUSE SCREENING: "Did you fall because someone pushed you or tried to hurt you?" If Yes, ask: "Are you safe now?"     No 3. ONSET: "When did the fall happen?" (e.g., minutes, hours, or days ago)     Last night 4. LOCATION: "What part of the body hit the ground?" (e.g., back, buttocks, head, hips, knees, hands, head, stomach)     Found on knees getting up using hands 5. INJURY: "Did you hurt (injure) yourself when you fell?" If Yes, ask: "What did you injure? Tell me more about this?" (e.g., body area; type of injury; pain severity)"     No injury 6. PAIN: "Is there any pain?" If Yes, ask: "How bad is the pain?" (e.g., Scale 1-10; or mild,  moderate, severe)   - NONE (0): No pain   - MILD (1-3): Doesn't interfere with normal activities    - MODERATE (4-7): Interferes with normal activities or awakens from sleep    - SEVERE (8-10): Excruciating pain, unable to do any normal activities      No complaints of pain 7.  OTHER SYMPTOMS: "Do you have any other symptoms?" (e.g., dizziness, fever, weakness; new onset or worsening).      Not known 8. CAUSE: "What do you think caused the fall (or falling)?" (e.g., tripped, dizzy spell)       Unsure  Protocols used: Falls and [facility]

## 2023-08-14 ENCOUNTER — Encounter: Payer: MEDICAID | Admitting: Speech Pathology

## 2023-08-15 ENCOUNTER — Encounter: Payer: Self-pay | Admitting: Physician Assistant

## 2023-08-15 ENCOUNTER — Ambulatory Visit (INDEPENDENT_AMBULATORY_CARE_PROVIDER_SITE_OTHER): Payer: MEDICAID | Admitting: Physician Assistant

## 2023-08-15 VITALS — BP 122/56 | HR 84 | Ht 70.0 in | Wt 196.0 lb

## 2023-08-15 DIAGNOSIS — F17200 Nicotine dependence, unspecified, uncomplicated: Secondary | ICD-10-CM | POA: Diagnosis not present

## 2023-08-15 DIAGNOSIS — F1020 Alcohol dependence, uncomplicated: Secondary | ICD-10-CM | POA: Diagnosis not present

## 2023-08-15 DIAGNOSIS — I69351 Hemiplegia and hemiparesis following cerebral infarction affecting right dominant side: Secondary | ICD-10-CM

## 2023-08-15 DIAGNOSIS — E785 Hyperlipidemia, unspecified: Secondary | ICD-10-CM

## 2023-08-15 DIAGNOSIS — D72829 Elevated white blood cell count, unspecified: Secondary | ICD-10-CM

## 2023-08-15 DIAGNOSIS — R296 Repeated falls: Secondary | ICD-10-CM

## 2023-08-15 DIAGNOSIS — L84 Corns and callosities: Secondary | ICD-10-CM

## 2023-08-15 NOTE — Progress Notes (Signed)
Date:  08/15/2023   Name:  Brian Rios   DOB:  Mar 02, 1981   MRN:  [id number]   Chief Complaint: Fall (3 times, no injuries, doesn't feel off balance)  HPI Brian Rios returns for evaluation of multiple falls recently, no injuries reported.  Mother wants him "checked out" for this.  I have reviewed recent visit notes from Dr. Elijah Birk (physical medicine and rehab) who saw him 07/24/23 for follow-up on his embolic stroke with residual deficits, refilled Brillinta, suggested he follow-up with neuro and interventional radiology as instructed, but patient has been noncompliant with follow-ups as well as PT, OT, and speech therapy.  Struggles with transportation.  Continued abuse of alcohol, marijuana, and tobacco.  Tells me he drinks 3-4 "forties" of malt liquor 3 times per week.  1 pack/day smoker.  Endorses dyspnea on exertion.  Complains of bilateral growths on foot which are painful when he walks   Medication list has been reviewed and updated.  Current Meds  Medication Sig   ABILIFY MAINTENA 400 MG PRSY prefilled syringe SMARTSIG:1 Each IM Every 4 Weeks   acetaminophen (TYLENOL) 325 MG tablet Take 2 tablets (650 mg total) by mouth every 4 (four) hours as needed for mild pain (or temp > 37.5 C (99.5 F)).   ARIPiprazole (ABILIFY) 30 MG tablet Take 1 tablet (30 mg total) by mouth at bedtime.   aspirin 81 MG chewable tablet Chew 1 tablet (81 mg total) by mouth daily.   atorvastatin (LIPITOR) 40 MG tablet Take 1 tablet (40 mg total) by mouth daily.   benztropine (COGENTIN) 1 MG tablet Take 1 tablet (1 mg total) by mouth at bedtime.   divalproex (DEPAKOTE ER) 250 MG 24 hr tablet Take 3 tablets (750 mg total) by mouth at bedtime.   escitalopram (LEXAPRO) 20 MG tablet Take 1 tablet (20 mg total) by mouth daily.   Lactobacillus (ACIDOPHILUS) CAPS capsule Take 1 capsule by mouth in the morning and at bedtime.   loperamide (IMODIUM A-D) 2 MG tablet Take 2 tablets (4 mg total) by mouth 3  (three) times daily.   mirtazapine (REMERON) 45 MG tablet Take 1 tablet (45 mg total) by mouth at bedtime.   valbenazine (INGREZZA) 80 MG capsule Take 1 capsule (80 mg total) by mouth daily.   [DISCONTINUED] ABILIFY ASIMTUFII 960 MG/3.2ML PRSY Inject into the muscle.     Review of Systems  Constitutional:  Negative for fatigue and fever.  Respiratory:  Positive for shortness of breath. Negative for chest tightness.   Cardiovascular:  Negative for chest pain and palpitations.  Gastrointestinal:  Negative for abdominal pain.    Patient Active Problem List   Diagnosis Date Noted   Right hemiparesis (HCC) 07/24/2023   Speech and language deficits 04/22/2023   Hemiparesis affecting right side as late effect of cerebrovascular accident (CVA) (HCC) 04/22/2023   Spasticity 04/22/2023   Chronic diarrhea 04/22/2023   Chronic schizophrenia (HCC) 04/01/2023   S/P right colectomy 09/04/2022   Dental decay 06/12/2022   Erectile dysfunction 07/26/2021   Hyperlipidemia LDL goal <70 03/28/2021   History of embolic stroke 07/18/2020   Cerebrovascular accident (CVA) due to embolism of left middle cerebral artery (HCC) 07/17/2020   Drug abuse (HCC) 07/17/2020   Major depressive disorder, recurrent episode, severe (HCC) 02/20/2015   Alcohol use disorder, severe, dependence (HCC) 02/20/2015   Stimulant use disorder 02/20/2015   Tobacco use disorder 02/20/2015    No Known Allergies  Immunization History  Administered Date(s) Administered  Tdap 11/07/2013, 04/05/2016    Past Surgical History:  Procedure Laterality Date   BOWEL RESECTION N/A 09/04/2022   Procedure: SMALL BOWEL RESECTION, open;  Surgeon: Leafy Ro, MD;  Location: [facility]S;  Service: General;  Laterality: N/A;   IR CT HEAD LTD  03/21/2023   IR CT HEAD LTD  03/21/2023   IR PERCUTANEOUS ART THROMBECTOMY/INFUSION INTRACRANIAL INC DIAG ANGIO  03/21/2023   ORIF ORBITAL FRACTURE  1985   PARTIAL COLECTOMY Right 09/04/2022    Procedure: PARTIAL COLECTOMY;  Surgeon: Leafy Ro, MD;  Location: [facility];  Service: General;  Laterality: Right;  Right colectimy   RADIOLOGY WITH ANESTHESIA N/A 03/21/2023   Procedure: RADIOLOGY WITH ANESTHESIA;  Surgeon: Radiologist, Medication, MD;  Location: [facility];  Service: Radiology;  Laterality: N/A;   TEE WITHOUT CARDIOVERSION N/A 07/19/2020   Procedure: TRANSESOPHAGEAL ECHOCARDIOGRAM (TEE);  Surgeon: Lamar Blinks, MD;  Location: [facility];  Service: Cardiovascular;  Laterality: N/A;   WRIST SURGERY Left 11/07/2013   REPAIR ULNAR ARTERY, NERVE AND TENDON    Social History   Tobacco Use   Smoking status: Every Day    Current packs/day: 1.00    Average packs/day: 1 pack/day for 15.0 years (15.0 ttl pk-yrs)    Types: Cigarettes    Passive exposure: Current   Smokeless tobacco: Never  Vaping Use   Vaping status: Never Used  Substance Use Topics   Alcohol use: Not Currently    Comment: 3-4 "forties" 40 oz malt liquor roughly 3 days per week   Drug use: Yes    Frequency: 7.0 times per week    Types: "Crack" cocaine, Marijuana    Comment: Marijuana 3x/wk. USED 6 MONTHS AGO CRACK (2023)    Family History  Problem Relation Age of Onset   Alcoholism Mother    Hypertension Mother    Breast cancer Mother    Alcoholism Father    Stroke Maternal Grandmother    Hypertension Maternal Grandmother    Diabetes Maternal Grandmother         08/15/2023    3:06 PM 05/30/2023   10:45 AM 05/02/2023   10:03 AM  GAD 7 : Generalized Anxiety Score  Nervous, Anxious, on Edge 1 0 2  Control/stop worrying 1 0 3  Worry too much - different things 1 0 3  Trouble relaxing 0 0 3  Restless 0 0 3  Easily annoyed or irritable 0 0 3  Afraid - awful might happen 0 0 0  Total GAD 7 Score 3 0 17  Anxiety Difficulty Not difficult at all Not difficult at all Extremely difficult       08/15/2023    3:06 PM 05/30/2023   10:45 AM 05/02/2023   10:02 AM  Depression screen PHQ 2/9  Decreased  Interest 0 0 0  Down, Depressed, Hopeless 2 2 0  PHQ - 2 Score 2 2 0  Altered sleeping 0 0 0  Tired, decreased energy 0 0 0  Change in appetite 0 3 3  Feeling bad or failure about yourself  0 2 0  Trouble concentrating 0 2 3  Moving slowly or fidgety/restless 0 2 3  Suicidal thoughts  0 0  PHQ-9 Score 2 11 9   Difficult doing work/chores Not difficult at all Very difficult Extremely dIfficult    BP Readings from Last 3 Encounters:  08/15/23 (!) 122/56  05/30/23 102/74  05/02/23 104/78    Wt Readings from Last 3 Encounters:  08/15/23 196 lb (88.9 kg)  07/24/23 200 lb (90.7 kg)  05/30/23 203 lb (92.1 kg)    BP (!) 122/56 (BP Location: Left Arm, Patient Position: Sitting, Cuff Size: Normal)   Pulse 84   Ht 5\' 10"  (1.778 m)   Wt 196 lb (88.9 kg)   SpO2 96%   BMI 28.12 kg/m   Physical Exam Vitals and nursing note reviewed.  Constitutional:      Appearance: Normal appearance.     Comments: Tobacco odor apparent  Eyes:     Pupils: Pupils are equal, round, and reactive to light.  Cardiovascular:     Rate and Rhythm: Normal rate and regular rhythm.     Heart sounds: No murmur heard.    No friction rub. No gallop.  Pulmonary:     Effort: Pulmonary effort is normal.     Breath sounds: Normal breath sounds.  Abdominal:     General: There is no distension.  Musculoskeletal:        General: Normal range of motion.  Skin:    General: Skin is warm and dry.  Neurological:     Mental Status: He is alert and oriented to person, place, and time.     Gait: Gait is intact.     Comments: Gait normal, observed for roughly 100 feet without ataxia  Psychiatric:        Mood and Affect: Mood and affect normal.     Recent Labs     Component Value Date/Time   NA 137 03/28/2023 1038   NA 138 07/18/2022 0951   NA 137 02/14/2015 1915   K 3.9 03/28/2023 1038   K 3.7 02/14/2015 1915   CL 104 03/28/2023 1038   CL 107 02/14/2015 1915   CO2 23 03/28/2023 1038   CO2 21 (L)  02/14/2015 1915   GLUCOSE 96 03/28/2023 1038   GLUCOSE 85 02/14/2015 1915   BUN 7 03/28/2023 1038   BUN 12 07/18/2022 0951   BUN 12 02/14/2015 1915   CREATININE 0.96 03/28/2023 1038   CREATININE 0.88 02/14/2015 1915   CALCIUM 9.0 03/28/2023 1038   CALCIUM 9.3 02/14/2015 1915   PROT 6.8 03/28/2023 1038   PROT 5.9 (L) 07/18/2022 0951   PROT 8.3 (H) 02/14/2015 1915   ALBUMIN 2.8 (L) 03/28/2023 1038   ALBUMIN 3.9 (L) 07/18/2022 0951   ALBUMIN 4.7 02/14/2015 1915   AST 33 03/28/2023 1038   AST 25 02/14/2015 1915   ALT 34 03/28/2023 1038   ALT 32 02/14/2015 1915   ALKPHOS 59 03/28/2023 1038   ALKPHOS 62 02/14/2015 1915   BILITOT 0.6 03/28/2023 1038   BILITOT <0.2 07/18/2022 0951   BILITOT 0.3 02/14/2015 1915   GFRNONAA >60 03/28/2023 1038   GFRNONAA >60 02/14/2015 1915   GFRAA >60 07/17/2020 1338   GFRAA >60 02/14/2015 1915    Lab Results  Component Value Date   WBC 12.6 (H) 03/28/2023   HGB 13.7 03/28/2023   HCT 41.8 03/28/2023   MCV 83.4 03/28/2023   PLT 329 03/28/2023   Lab Results  Component Value Date   HGBA1C 5.6 03/23/2023   Lab Results  Component Value Date   CHOL 113 03/22/2023   HDL 37 (L) 03/22/2023   LDLCALC 54 03/22/2023   TRIG 109 03/22/2023   CHOLHDL 3.1 03/22/2023   Lab Results  Component Value Date   TSH 4.040 03/28/2021     Assessment and Plan:  1. Frequent falls At this time I have no reason to believe this is a neurological or  physical impairment.  Rather, I believe it is related to his severe alcohol abuse and intoxication.  Check CBC and CMP as below. - CBC with Differential/Platelet - Comprehensive metabolic panel  2. Alcohol use disorder, severe, dependence (HCC) Reviewed that the amount of alcohol he drinks on a given night is about 160 ounces (10 pints) of malt liquor which is roughly equivalent to a case of beer (24 cans).  Emphasized the importance of alcohol reduction and eventual cessation especially given his chronic health  conditions and multiple strokes.  We are stopping Brilinta today due to his severe alcohol abuse and high risk of bleeding. - CBC with Differential/Platelet - Comprehensive metabolic panel  3. Tobacco use disorder Counseled on cessation - CBC with Differential/Platelet - Comprehensive metabolic panel  4. Leukocytosis, unspecified type Repeat CBC - CBC with Differential/Platelet  5. Hemiparesis affecting right side as late effect of cerebrovascular accident (CVA) (HCC) We are stopping Brilinta today due to his severe alcohol abuse and high risk of bleeding.  Encouraged to follow-up with neurology.  I think his referral may have been declined after missing 2 appointments, but I intend to call [facility] neurology to follow-up on this and see if they will give him a third attempt. - Comprehensive metabolic panel - Lipid panel  6. Hyperlipidemia LDL goal <70 Reports good compliance with statin.  Check CMP and lipids - Comprehensive metabolic panel - Lipid panel  7. Corn of foot Patient education provided.  May attempt filing the corn at home.  Referring to podiatry for additional management. - Ambulatory referral to Podiatry    Return in about 4 weeks (around 09/12/2023) for OV f/u chronic conditions.    Alvester Morin, PA-C, DMSc, Nutritionist [facility] [phone]

## 2023-08-15 NOTE — Patient Instructions (Addendum)
-  I think you should STOP Brillinta because you are increased risk of bleeding with alcohol abuse and frequent falls -Please make serious effort to dramatically reduce your alcohol and tobacco use. -I have submitted a referral to podiatry (foot doctor) for the corns and calluses on your feet -You should follow up with your neurologist (brain doctor) for your stroke. This is [facility] in [place]. Call [phone] to schedule.

## 2023-08-16 ENCOUNTER — Telehealth: Payer: Self-pay | Admitting: Physician Assistant

## 2023-08-16 LAB — CBC WITH DIFFERENTIAL/PLATELET
Basophils Absolute: 0.1 10*3/uL (ref 0.0–0.2)
Basos: 1 %
EOS (ABSOLUTE): 0.2 10*3/uL (ref 0.0–0.4)
Eos: 2 %
Hematocrit: 42.8 % (ref 37.5–51.0)
Hemoglobin: 13.8 g/dL (ref 13.0–17.7)
Immature Grans (Abs): 0.1 10*3/uL (ref 0.0–0.1)
Immature Granulocytes: 1 %
Lymphocytes Absolute: 2.2 10*3/uL (ref 0.7–3.1)
Lymphs: 24 %
MCH: 27.6 pg (ref 26.6–33.0)
MCHC: 32.2 g/dL (ref 31.5–35.7)
MCV: 86 fL (ref 79–97)
Monocytes Absolute: 0.8 10*3/uL (ref 0.1–0.9)
Monocytes: 8 %
Neutrophils Absolute: 6 10*3/uL (ref 1.4–7.0)
Neutrophils: 64 %
Platelets: 228 10*3/uL (ref 150–450)
RBC: 5 x10E6/uL (ref 4.14–5.80)
RDW: 14.1 % (ref 11.6–15.4)
WBC: 9.3 10*3/uL (ref 3.4–10.8)

## 2023-08-16 LAB — LIPID PANEL
Chol/HDL Ratio: 2.1 ratio (ref 0.0–5.0)
Cholesterol, Total: 111 mg/dL (ref 100–199)
HDL: 52 mg/dL (ref >39)
LDL Chol Calc (NIH): 43 mg/dL (ref 0–99)
Triglycerides: 76 mg/dL (ref 0–149)
VLDL Cholesterol Cal: 16 mg/dL (ref 5–40)

## 2023-08-16 LAB — COMPREHENSIVE METABOLIC PANEL
ALT: 14 [IU]/L (ref 0–44)
AST: 16 [IU]/L (ref 0–40)
Albumin: 4.3 g/dL (ref 4.1–5.1)
Alkaline Phosphatase: 88 [IU]/L (ref 44–121)
BUN/Creatinine Ratio: 11 (ref 9–20)
BUN: 11 mg/dL (ref 6–24)
Bilirubin Total: <0.2 mg/dL (ref 0.0–1.2)
CO2: 24 mmol/L (ref 20–29)
Calcium: 9.1 mg/dL (ref 8.7–10.2)
Chloride: 107 mmol/L — ABNORMAL HIGH (ref 96–106)
Creatinine, Ser: 0.96 mg/dL (ref 0.76–1.27)
Globulin, Total: 2.9 g/dL (ref 1.5–4.5)
Glucose: 86 mg/dL (ref 70–99)
Potassium: 4.4 mmol/L (ref 3.5–5.2)
Sodium: 143 mmol/L (ref 134–144)
Total Protein: 7.2 g/dL (ref 6.0–8.5)
eGFR: 100 mL/min/{1.73_m2} (ref >59)

## 2023-08-16 NOTE — Telephone Encounter (Signed)
Copied from CRM [id number]. Topic: General - Other >> Aug 15, 2023  5:04 PM Santiya F wrote: Reason for CRM: Pt's mother is calling in because pt's ride is on the way and she wanted to let him know to be somewhere they can see him.

## 2023-08-19 ENCOUNTER — Encounter: Payer: MEDICAID | Admitting: Occupational Therapy

## 2023-08-19 ENCOUNTER — Encounter: Payer: MEDICAID | Admitting: Speech Pathology

## 2023-08-21 ENCOUNTER — Encounter: Payer: MEDICAID | Admitting: Speech Pathology

## 2023-08-26 ENCOUNTER — Encounter: Payer: MEDICAID | Admitting: Speech Pathology

## 2023-08-26 ENCOUNTER — Encounter: Payer: MEDICAID | Admitting: Occupational Therapy

## 2023-08-28 ENCOUNTER — Encounter: Payer: MEDICAID | Admitting: Speech Pathology

## 2023-08-28 ENCOUNTER — Encounter: Payer: MEDICAID | Admitting: Occupational Therapy

## 2023-08-28 ENCOUNTER — Ambulatory Visit: Payer: MEDICAID | Admitting: Physical Therapy

## 2023-08-30 ENCOUNTER — Ambulatory Visit: Payer: MEDICAID | Admitting: Physician Assistant

## 2023-09-02 ENCOUNTER — Encounter: Payer: MEDICAID | Admitting: Speech Pathology

## 2023-09-02 ENCOUNTER — Encounter: Payer: MEDICAID | Admitting: Occupational Therapy

## 2023-09-04 ENCOUNTER — Encounter: Payer: MEDICAID | Admitting: Occupational Therapy

## 2023-09-04 ENCOUNTER — Encounter: Payer: MEDICAID | Admitting: Speech Pathology

## 2023-09-10 ENCOUNTER — Encounter: Payer: MEDICAID | Admitting: Speech Pathology

## 2023-09-10 ENCOUNTER — Ambulatory Visit: Payer: MEDICAID

## 2023-09-11 ENCOUNTER — Encounter: Payer: MEDICAID | Admitting: Speech Pathology

## 2023-09-12 ENCOUNTER — Encounter: Payer: MEDICAID | Admitting: Speech Pathology

## 2023-09-12 ENCOUNTER — Ambulatory Visit (INDEPENDENT_AMBULATORY_CARE_PROVIDER_SITE_OTHER): Payer: MEDICAID | Admitting: Physician Assistant

## 2023-09-12 ENCOUNTER — Encounter: Payer: Self-pay | Admitting: Physician Assistant

## 2023-09-12 ENCOUNTER — Encounter: Payer: MEDICAID | Admitting: Occupational Therapy

## 2023-09-12 VITALS — BP 108/74 | HR 82 | Temp 98.2°F | Ht 70.0 in | Wt 199.0 lb

## 2023-09-12 DIAGNOSIS — F1021 Alcohol dependence, in remission: Secondary | ICD-10-CM

## 2023-09-12 DIAGNOSIS — I69351 Hemiplegia and hemiparesis following cerebral infarction affecting right dominant side: Secondary | ICD-10-CM

## 2023-09-12 DIAGNOSIS — F17200 Nicotine dependence, unspecified, uncomplicated: Secondary | ICD-10-CM | POA: Diagnosis not present

## 2023-09-12 NOTE — Progress Notes (Signed)
Date:  09/12/2023   Name:  Brian Rios   DOB:  [birth year]   MRN:  [id number]   Chief Complaint: Chronic Conditions  HPI Brian Rios presents for 1 month follow up on severe alcohol use disorder, last visit labs were normal and he reported roughly up to 160 oz malt liquor consumption 3x/wk. Today he gladly reports he completely stopped alcohol after our last visit together.   Still smoking cigars and marijuana.   He has not made follow up with neurology as advised multiple times for residual deficits after stroke. Thanks to PT, presently it seems he just has some right hand weakness/coordination deficit.   Sees [facility] for his schizophrenia and polysubstance use disorder. Last visit was yesterday. He has run out of Ingrezza despite me having sent refill to mail order in Sept. I've advised he have [facility] take over this prescription.   We stopped his Brillinta last time after having a discussion about his uncontrolled drinking putting him at high risk of bleeding through the combination of alcohol abuse and frequent falling. He remains on ASA 81, and last LDL 43 one month ago on atorvastatin 40 mg.   Medication list has been reviewed and updated.  Current Meds  Medication Sig   ABILIFY MAINTENA 400 MG PRSY prefilled syringe SMARTSIG:1 Each IM Every 4 Weeks   acetaminophen (TYLENOL) 325 MG tablet Take 2 tablets (650 mg total) by mouth every 4 (four) hours as needed for mild pain (or temp > 37.5 C (99.5 F)).   ARIPiprazole (ABILIFY) 30 MG tablet Take 1 tablet (30 mg total) by mouth at bedtime.   aspirin 81 MG chewable tablet Chew 1 tablet (81 mg total) by mouth daily.   atorvastatin (LIPITOR) 40 MG tablet Take 1 tablet (40 mg total) by mouth daily.   benztropine (COGENTIN) 1 MG tablet Take 1 tablet (1 mg total) by mouth at bedtime.   divalproex (DEPAKOTE ER) 250 MG 24 hr tablet Take 3 tablets (750 mg total) by mouth at bedtime.   escitalopram (LEXAPRO) 20 MG tablet Take 1 tablet (20 mg total)  by mouth daily.   Lactobacillus (ACIDOPHILUS) CAPS capsule Take 1 capsule by mouth in the morning and at bedtime.   loperamide (IMODIUM A-D) 2 MG tablet Take 2 tablets (4 mg total) by mouth 3 (three) times daily.   mirtazapine (REMERON) 45 MG tablet Take 1 tablet (45 mg total) by mouth at bedtime.   valbenazine (INGREZZA) 80 MG capsule Take 1 capsule (80 mg total) by mouth daily.     Review of Systems  Patient Active Problem List   Diagnosis Date Noted   Speech and language deficits 04/22/2023   Hemiparesis affecting right side as late effect of cerebrovascular accident (CVA) (HCC) 04/22/2023   Spasticity 04/22/2023   Chronic diarrhea 04/22/2023   Chronic schizophrenia (HCC) 04/01/2023   S/P right colectomy 09/04/2022   Dental decay 06/12/2022   Erectile dysfunction 07/26/2021   Hyperlipidemia LDL goal <70 03/28/2021   History of embolic stroke 07/18/2020   Drug abuse (HCC) 07/17/2020   Major depressive disorder, recurrent episode, severe (HCC) 02/20/2015   Alcohol use disorder, severe, dependence (HCC) 02/20/2015   Stimulant use disorder 02/20/2015   Tobacco use disorder 02/20/2015    No Known Allergies  Immunization History  Administered Date(s) Administered   Tdap 11/07/2013, 04/05/2016    Past Surgical History:  Procedure Laterality Date   BOWEL RESECTION N/A 09/04/2022   Procedure: SMALL BOWEL RESECTION, open;  Surgeon: Leafy Ro,  MD;  Location: [facility] ORS;  Service: General;  Laterality: N/A;   IR CT HEAD LTD  03/21/2023   IR CT HEAD LTD  03/21/2023   IR PERCUTANEOUS ART THROMBECTOMY/INFUSION INTRACRANIAL INC DIAG ANGIO  03/21/2023   ORIF ORBITAL FRACTURE  1985   PARTIAL COLECTOMY Right 09/04/2022   Procedure: PARTIAL COLECTOMY;  Surgeon: Leafy Ro, MD;  Location: [facility] OR[facility];  Service: General;  Laterality: Right;  Right colectimy   RADIOLOGY WITH ANESTHESIA N/A 03/21/2023   Procedure: RADIOLOGY WITH ANESTHESIA;  Surgeon: Radiologist, Medication, MD;  Location:  [facility];  Service: Radiology;  Laterality: N/A;   TEE WITHOUT CARDIOVERSION N/A 07/19/2020   Procedure: TRANSESOPHAGEAL ECHOCARDIOGRAM (TEE);  Surgeon: Lamar Blinks, MD;  Location: [facility] ORS;  Service: Cardiovascular;  Laterality: N/A;   WRIST SURGERY Left 11/07/2013   REPAIR ULNAR ARTERY, NERVE AND TENDON    Social History   Tobacco Use   Smoking status: Every Day    Current packs/day: 0.00    Average packs/day: 1 pack/day for 15.0 years (15.0 ttl pk-yrs)    Types: Cigarettes, Cigars    Last attempt to quit: 03/2023    Years since quitting: 0.4    Passive exposure: Current   Smokeless tobacco: Never   Tobacco comments:    Now smoking about 3 cigars per day, no cigarettes 09/12/23.   Vaping Use   Vaping status: Never Used  Substance Use Topics   Alcohol use: Not Currently    Comment: 3-4 "forties" 40 oz malt liquor roughly 3 days per week   Drug use: Yes    Frequency: 7.0 times per week    Types: "Crack" cocaine, Marijuana    Comment: Marijuana 3x/wk. USED 6 MONTHS AGO CRACK (2023)    Family History  Problem Relation Age of Onset   Alcoholism Mother    Hypertension Mother    Breast cancer Mother    Alcoholism Father    Stroke Maternal Grandmother    Hypertension Maternal Grandmother    Diabetes Maternal Grandmother         08/15/2023    3:06 PM 05/30/2023   10:45 AM 05/02/2023   10:03 AM  GAD 7 : Generalized Anxiety Score  Nervous, Anxious, on Edge 1 0 2  Control/stop worrying 1 0 3  Worry too much - different things 1 0 3  Trouble relaxing 0 0 3  Restless 0 0 3  Easily annoyed or irritable 0 0 3  Afraid - awful might happen 0 0 0  Total GAD 7 Score 3 0 17  Anxiety Difficulty Not difficult at all Not difficult at all Extremely difficult       08/15/2023    3:06 PM 05/30/2023   10:45 AM 05/02/2023   10:02 AM  Depression screen PHQ 2/9  Decreased Interest 0 0 0  Down, Depressed, Hopeless 2 2 0  PHQ - 2 Score 2 2 0  Altered sleeping 0 0 0  Tired,  decreased energy 0 0 0  Change in appetite 0 3 3  Feeling bad or failure about yourself  0 2 0  Trouble concentrating 0 2 3  Moving slowly or fidgety/restless 0 2 3  Suicidal thoughts  0 0  PHQ-9 Score 2 11 9   Difficult doing work/chores Not difficult at all Very difficult Extremely dIfficult    BP Readings from Last 3 Encounters:  09/12/23 108/74  08/15/23 (!) 122/56  05/30/23 102/74    Wt Readings from Last 3 Encounters:  09/12/23 199  lb (90.3 kg)  08/15/23 196 lb (88.9 kg)  07/24/23 200 lb (90.7 kg)    BP 108/74   Pulse 82   Temp 98.2 F (36.8 C) (Oral)   Ht 5\' 10"  (1.778 m)   Wt 199 lb (90.3 kg)   SpO2 95%   BMI 28.55 kg/m   Physical Exam Vitals and nursing note reviewed.  Constitutional:      Appearance: Normal appearance.  Eyes:     Comments: Left exotropia  Neck:     Vascular: No carotid bruit.  Cardiovascular:     Rate and Rhythm: Normal rate and regular rhythm.     Heart sounds: No murmur heard.    No friction rub. No gallop.  Pulmonary:     Effort: Pulmonary effort is normal.     Breath sounds: Normal breath sounds.  Abdominal:     General: There is no distension.  Musculoskeletal:        General: Normal range of motion.     Comments: Strength 5/5 for flexion/extension of bilateral upper and lower extremity. Mild weakness in right hand grip strength compared to left.   Skin:    General: Skin is warm and dry.  Neurological:     Mental Status: He is alert and oriented to person, place, and time.     Gait: Gait is intact.     Comments: Gait intact without assistance. Some speech difficulty apparent, but patient is mostly communicative and easy to understand.    Psychiatric:        Mood and Affect: Mood and affect normal.     Recent Labs     Component Value Date/Time   NA 143 08/15/2023 1616   NA 137 02/14/2015 1915   K 4.4 08/15/2023 1616   K 3.7 02/14/2015 1915   CL 107 (H) 08/15/2023 1616   CL 107 02/14/2015 1915   CO2 24 08/15/2023  1616   CO2 21 (L) 02/14/2015 1915   GLUCOSE 86 08/15/2023 1616   GLUCOSE 96 03/28/2023 1038   GLUCOSE 85 02/14/2015 1915   BUN 11 08/15/2023 1616   BUN 12 02/14/2015 1915   CREATININE 0.96 08/15/2023 1616   CREATININE 0.88 02/14/2015 1915   CALCIUM 9.1 08/15/2023 1616   CALCIUM 9.3 02/14/2015 1915   PROT 7.2 08/15/2023 1616   PROT 8.3 (H) 02/14/2015 1915   ALBUMIN 4.3 08/15/2023 1616   ALBUMIN 4.7 02/14/2015 1915   AST 16 08/15/2023 1616   AST 25 02/14/2015 1915   ALT 14 08/15/2023 1616   ALT 32 02/14/2015 1915   ALKPHOS 88 08/15/2023 1616   ALKPHOS 62 02/14/2015 1915   BILITOT <0.2 08/15/2023 1616   BILITOT 0.3 02/14/2015 1915   GFRNONAA >60 03/28/2023 1038   GFRNONAA >60 02/14/2015 1915   GFRAA >60 07/17/2020 1338   GFRAA >60 02/14/2015 1915    Lab Results  Component Value Date   WBC 9.3 08/15/2023   HGB 13.8 08/15/2023   HCT 42.8 08/15/2023   MCV 86 08/15/2023   PLT 228 08/15/2023   Lab Results  Component Value Date   HGBA1C 5.6 03/23/2023   Lab Results  Component Value Date   CHOL 111 08/15/2023   HDL 52 08/15/2023   LDLCALC 43 08/15/2023   TRIG 76 08/15/2023   CHOLHDL 2.1 08/15/2023   Lab Results  Component Value Date   TSH 4.040 03/28/2021     Assessment and Plan:  1. Hemiparesis affecting right side as late effect of cerebrovascular accident (CVA) (  HCC) Again given number for [facility] who I have contacted on his behalf and confirmed referral is still active.  Seems to be doing well overall.  2. Tobacco use disorder Congratulated on progress so far.  Encouraged continued steps towards cessation.  3. Alcohol use disorder, severe, in early remission (HCC) Congratulated on progress, encouraged continued lifelong sobriety.   Return in about 3 months (around 12/13/2023) for OV f/u chronic conditions.    Alvester Morin, PA-C, DMSc, Nutritionist [facility] Primary Care and Sports Medicine [facility] [phone]

## 2023-09-16 ENCOUNTER — Encounter: Payer: MEDICAID | Admitting: Speech Pathology

## 2023-09-16 ENCOUNTER — Encounter: Payer: MEDICAID | Admitting: Occupational Therapy

## 2023-09-16 ENCOUNTER — Ambulatory Visit: Payer: MEDICAID

## 2023-09-18 ENCOUNTER — Encounter: Payer: MEDICAID | Admitting: Speech Pathology

## 2023-09-23 ENCOUNTER — Encounter: Payer: MEDICAID | Attending: Physical Medicine and Rehabilitation | Admitting: Physical Medicine and Rehabilitation

## 2023-09-23 ENCOUNTER — Encounter: Payer: MEDICAID | Admitting: Speech Pathology

## 2023-09-23 ENCOUNTER — Encounter: Payer: MEDICAID | Admitting: Occupational Therapy

## 2023-09-23 ENCOUNTER — Ambulatory Visit: Payer: MEDICAID

## 2023-09-23 DIAGNOSIS — I63412 Cerebral infarction due to embolism of left middle cerebral artery: Secondary | ICD-10-CM | POA: Insufficient documentation

## 2023-09-23 DIAGNOSIS — F809 Developmental disorder of speech and language, unspecified: Secondary | ICD-10-CM | POA: Insufficient documentation

## 2023-09-23 DIAGNOSIS — G8191 Hemiplegia, unspecified affecting right dominant side: Secondary | ICD-10-CM | POA: Insufficient documentation

## 2023-09-23 DIAGNOSIS — F0631 Mood disorder due to known physiological condition with depressive features: Secondary | ICD-10-CM | POA: Insufficient documentation

## 2023-09-23 DIAGNOSIS — I639 Cerebral infarction, unspecified: Secondary | ICD-10-CM | POA: Insufficient documentation

## 2023-09-23 DIAGNOSIS — R252 Cramp and spasm: Secondary | ICD-10-CM | POA: Insufficient documentation

## 2023-09-25 ENCOUNTER — Ambulatory Visit: Payer: MEDICAID | Admitting: Physical Therapy

## 2023-09-25 ENCOUNTER — Encounter: Payer: MEDICAID | Admitting: Occupational Therapy

## 2023-09-25 ENCOUNTER — Encounter: Payer: MEDICAID | Admitting: Speech Pathology

## 2023-09-26 ENCOUNTER — Ambulatory Visit: Payer: MEDICAID | Admitting: Podiatry

## 2023-09-27 ENCOUNTER — Other Ambulatory Visit: Payer: Self-pay | Admitting: Physician Assistant

## 2023-09-30 ENCOUNTER — Ambulatory Visit: Payer: MEDICAID | Admitting: Physical Therapy

## 2023-09-30 ENCOUNTER — Encounter: Payer: MEDICAID | Admitting: Speech Pathology

## 2023-09-30 ENCOUNTER — Encounter: Payer: MEDICAID | Admitting: Occupational Therapy

## 2023-10-02 ENCOUNTER — Encounter: Payer: MEDICAID | Admitting: Occupational Therapy

## 2023-10-02 ENCOUNTER — Encounter: Payer: MEDICAID | Admitting: Speech Pathology

## 2023-10-02 ENCOUNTER — Ambulatory Visit: Payer: MEDICAID | Admitting: Physical Therapy

## 2023-10-07 ENCOUNTER — Encounter: Payer: MEDICAID | Admitting: Speech Pathology

## 2023-10-07 ENCOUNTER — Encounter: Payer: MEDICAID | Admitting: Occupational Therapy

## 2023-10-07 ENCOUNTER — Ambulatory Visit: Payer: MEDICAID | Admitting: Physical Therapy

## 2023-10-09 ENCOUNTER — Encounter: Payer: MEDICAID | Admitting: Speech Pathology

## 2023-10-09 ENCOUNTER — Ambulatory Visit: Payer: MEDICAID | Admitting: Physical Therapy

## 2023-10-09 ENCOUNTER — Encounter: Payer: MEDICAID | Admitting: Occupational Therapy

## 2023-10-31 ENCOUNTER — Ambulatory Visit: Payer: MEDICAID | Admitting: Podiatry

## 2023-11-07 ENCOUNTER — Ambulatory Visit: Payer: MEDICAID | Admitting: Podiatry

## 2023-11-14 ENCOUNTER — Ambulatory Visit: Payer: MEDICAID | Admitting: Podiatry

## 2023-11-14 ENCOUNTER — Encounter: Payer: Self-pay | Admitting: Podiatry

## 2023-11-14 DIAGNOSIS — D492 Neoplasm of unspecified behavior of bone, soft tissue, and skin: Secondary | ICD-10-CM

## 2023-11-14 NOTE — Progress Notes (Signed)
Subjective:  Patient ID: Brian Rios, male    DOB: 04/27/1981,  MRN: [id number]  Chief Complaint  Patient presents with   Callouses    "These corns hurt."    43 y.o. male presents with the above complaint.  Patient presents with bilateral submetatarsal 5 porokeratotic lesion painful to touch is progressive gotten worse worse with ambulation worse with pressure he would like for me to debride out is not able to do it himself   Review of Systems: Negative except as noted in the HPI. Denies N/V/F/Ch.  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   left cerebrum   Erectile dysfunction    Heart murmur    Hyperlipidemia    Schizophrenia (HCC)    Tobacco abuse     Current Outpatient Medications:    ABILIFY MAINTENA 400 MG PRSY prefilled syringe, SMARTSIG:1 Each IM Every 4 Weeks, Disp: , Rfl:    acetaminophen (TYLENOL) 325 MG tablet, Take 2 tablets (650 mg total) by mouth every 4 (four) hours as needed for mild pain (or temp > 37.5 C (99.5 F))., Disp: , Rfl:    ARIPiprazole (ABILIFY) 30 MG tablet, Take 1 tablet (30 mg total) by mouth at bedtime., Disp: 90 tablet, Rfl: 1   aspirin 81 MG chewable tablet, Chew 1 tablet (81 mg total) by mouth daily., Disp: , Rfl:    atorvastatin (LIPITOR) 40 MG tablet, Take 1 tablet (40 mg total) by mouth daily., Disp: 90 tablet, Rfl: 3   benztropine (COGENTIN) 1 MG tablet, Take 1 tablet (1 mg total) by mouth at bedtime., Disp: 90 tablet, Rfl: 0   divalproex (DEPAKOTE ER) 250 MG 24 hr tablet, Take 3 tablets (750 mg total) by mouth at bedtime., Disp: 270 tablet, Rfl: 0   escitalopram (LEXAPRO) 20 MG tablet, Take 1 tablet (20 mg total) by mouth daily., Disp: 90 tablet, Rfl: 1   Lactobacillus (ACIDOPHILUS) CAPS capsule, Take 1 capsule by mouth in the morning and at bedtime., Disp: 180 capsule, Rfl: 3   loperamide (IMODIUM A-D) 2 MG tablet, Take 2  tablets (4 mg total) by mouth 3 (three) times daily., Disp: 90 tablet, Rfl: 2   mirtazapine (REMERON) 45 MG tablet, Take 1 tablet (45 mg total) by mouth at bedtime., Disp: 90 tablet, Rfl: 1   valbenazine (INGREZZA) 80 MG capsule, Take 1 capsule (80 mg total) by mouth daily., Disp: 90 capsule, Rfl: 0  Social History   Tobacco Use  Smoking Status Every Day   Current packs/day: 0.00   Average packs/day: 1 pack/day for 15.0 years (15.0 ttl pk-yrs)   Types: Cigars, Cigarettes   Start date: 03/2023   Last attempt to quit: 03/2023   Years since quitting: 0.6   Passive exposure: Current  Smokeless Tobacco Never  Tobacco Comments   Formerly cigarettes x15y. Now smoking about 5 cigars per day, no cigarettes    No Known Allergies Objective:  There were no vitals filed for this visit. There is no height or weight on file to calculate BMI. Constitutional Well developed. Well nourished.  Vascular Dorsalis pedis pulses palpable bilaterally. Posterior tibial pulses palpable bilaterally. Capillary refill normal to all digits.  No cyanosis or clubbing noted. Pedal hair growth normal.  Neurologic Normal speech. Oriented to person, place, and time. Epicritic sensation to light touch grossly present bilaterally.  Dermatologic Bilateral submetatarsal 5 porokeratotic lesion central  nucleated core noted.  Pain on palpation.  No pinpoint bleeding noted upon debridement  Orthopedic: Normal joint ROM without pain or crepitus bilaterally. No visible deformities. No bony tenderness.   Radiographs: None Assessment:   1. Skin neoplasm    Plan:  Patient was evaluated and treated and all questions answered.  Bilateral submetatarsal 5 porokeratotic lesion/skin neoplasm -All questions and concerns were discussed with the patient in extensive detail given the amount of pain that he is having he will benefit from aggressive debridement of the lesion using chisel blade handle the lesion was debrided down  to healthy dry tissue no complication noted no pinpoint bleeding noted  No follow-ups on file.

## 2023-12-06 ENCOUNTER — Telehealth: Payer: Self-pay | Admitting: Physician Assistant

## 2023-12-06 NOTE — Telephone Encounter (Signed)
Home Health Verbal Orders - Caller/Agency: [facility] Callback Number: [phone] Service Requested: Physical Therapy and Speech Therapy Frequency: Needs evaluation  Any new concerns about the patient? No Needs new referral to reengage

## 2023-12-06 NOTE — Telephone Encounter (Signed)
Please review and advise. Thank you  JM

## 2023-12-06 NOTE — Telephone Encounter (Signed)
LVM with Home health to call me back

## 2023-12-09 NOTE — Telephone Encounter (Signed)
Spoke with Melissa.  Last week's message was to notify me that due to repeated no-shows despite attempts to arrange transportation, the referral for this patient's home health services has been closed.

## 2023-12-13 ENCOUNTER — Encounter: Payer: Self-pay | Admitting: Physician Assistant

## 2023-12-13 ENCOUNTER — Ambulatory Visit: Payer: MEDICAID | Admitting: Physician Assistant

## 2023-12-13 VITALS — BP 102/74 | HR 84 | Temp 97.9°F | Ht 70.0 in | Wt 196.0 lb

## 2023-12-13 DIAGNOSIS — I69351 Hemiplegia and hemiparesis following cerebral infarction affecting right dominant side: Secondary | ICD-10-CM | POA: Diagnosis not present

## 2023-12-13 DIAGNOSIS — F17200 Nicotine dependence, unspecified, uncomplicated: Secondary | ICD-10-CM

## 2023-12-13 DIAGNOSIS — Z91199 Patient's noncompliance with other medical treatment and regimen due to unspecified reason: Secondary | ICD-10-CM | POA: Diagnosis not present

## 2023-12-13 NOTE — Progress Notes (Signed)
Date:  12/13/2023   Name:  Brian Rios   DOB:  1980-12-24   MRN:  [id number]   Chief Complaint: Medical Management of Chronic Issues  HPI Brian Rios persists for 3 months follow up on chronic conditions. Right hemiparesis s/p CVA. Hx polysubstance abuse. Doing much better with alcohol per his report, remains sober over the last 3 months aside from two "slip ups", the most recent of which was about a month ago when he drank a "small bottle" of [place] mist whiskey; I suspect this was probably a 200 mL plastic bottle as he states it was bigger than an airplane bottle, but much smaller than a handle.  Still smoking cigarettes/cigars and marijuana.   He has not made follow up with neurology as advised many times for residual deficits after stroke. Presently it seems he just has some right hand weakness/coordination deficit.   Sees [facility] for his schizophrenia and polysubstance use disorder. They have taken over Ingrezza rx.  Home PT referral was closed due to no-shows. Wants to reopen PT referral. He has a new cell number.   Medication list has been reviewed and updated.  Current Meds  Medication Sig   ABILIFY MAINTENA 400 MG PRSY prefilled syringe SMARTSIG:1 Each IM Every 4 Weeks   acetaminophen (TYLENOL) 325 MG tablet Take 2 tablets (650 mg total) by mouth every 4 (four) hours as needed for mild pain (or temp > 37.5 C (99.5 F)).   ARIPiprazole (ABILIFY) 30 MG tablet Take 1 tablet (30 mg total) by mouth at bedtime.   aspirin 81 MG chewable tablet Chew 1 tablet (81 mg total) by mouth daily.   atorvastatin (LIPITOR) 40 MG tablet Take 1 tablet (40 mg total) by mouth daily.   benztropine (COGENTIN) 1 MG tablet Take 1 tablet (1 mg total) by mouth at bedtime.   divalproex (DEPAKOTE ER) 250 MG 24 hr tablet Take 3 tablets (750 mg total) by mouth at bedtime.   escitalopram (LEXAPRO) 20 MG tablet Take 1 tablet (20 mg total) by mouth daily.   Lactobacillus (ACIDOPHILUS) CAPS capsule Take 1  capsule by mouth in the morning and at bedtime.   loperamide (IMODIUM A-D) 2 MG tablet Take 2 tablets (4 mg total) by mouth 3 (three) times daily.   mirtazapine (REMERON) 45 MG tablet Take 1 tablet (45 mg total) by mouth at bedtime.   valbenazine (INGREZZA) 80 MG capsule Take 1 capsule (80 mg total) by mouth daily.     Review of Systems  Patient Active Problem List   Diagnosis Date Noted   Speech and language deficits 04/22/2023   Hemiparesis affecting right side as late effect of cerebrovascular accident (CVA) (HCC) 04/22/2023   Spasticity 04/22/2023   Chronic diarrhea 04/22/2023   Chronic schizophrenia (HCC) 04/01/2023   S/P right colectomy 09/04/2022   Dental decay 06/12/2022   Erectile dysfunction 07/26/2021   Hyperlipidemia LDL goal <70 03/28/2021   History of embolic stroke 07/18/2020   Drug abuse (HCC) 07/17/2020   Major depressive disorder, recurrent episode, severe (HCC) 02/20/2015   Alcohol use disorder, severe, dependence (HCC) 02/20/2015   Stimulant use disorder 02/20/2015   Tobacco use disorder 02/20/2015    No Known Allergies  Immunization History  Administered Date(s) Administered   Tdap 11/07/2013, 04/05/2016    Past Surgical History:  Procedure Laterality Date   BOWEL RESECTION N/A 09/04/2022   Procedure: SMALL BOWEL RESECTION, open;  Surgeon: Leafy Ro, MD;  Location: [facility] ORS;  Service: General;  Laterality: N/A;  IR CT HEAD LTD  03/21/2023   IR CT HEAD LTD  03/21/2023   IR PERCUTANEOUS ART THROMBECTOMY/INFUSION INTRACRANIAL INC DIAG ANGIO  03/21/2023   ORIF ORBITAL FRACTURE  1985   PARTIAL COLECTOMY Right 09/04/2022   Procedure: PARTIAL COLECTOMY;  Surgeon: Leafy Ro, MD;  Location: [facility] ORS;  Service: General;  Laterality: Right;  Right colectimy   RADIOLOGY WITH ANESTHESIA N/A 03/21/2023   Procedure: RADIOLOGY WITH ANESTHESIA;  Surgeon: Radiologist, Medication, MD;  Location: [facility];  Service: Radiology;  Laterality: N/A;   TEE WITHOUT  CARDIOVERSION N/A 07/19/2020   Procedure: TRANSESOPHAGEAL ECHOCARDIOGRAM (TEE);  Surgeon: Lamar Blinks, MD;  Location: [facility] ORS;  Service: Cardiovascular;  Laterality: N/A;   WRIST SURGERY Left 11/07/2013   REPAIR ULNAR ARTERY, NERVE AND TENDON    Social History   Tobacco Use   Smoking status: Every Day    Current packs/day: 0.00    Average packs/day: 1 pack/day for 15.0 years (15.0 ttl pk-yrs)    Types: Cigars, Cigarettes    Start date: 03/2023    Last attempt to quit: 03/2023    Years since quitting: 0.7    Passive exposure: Current   Smokeless tobacco: Never   Tobacco comments:    Formerly cigarettes x15y. Now smoking about 5 cigars per day, no cigarettes  Vaping Use   Vaping status: Never Used  Substance Use Topics   Alcohol use: Not Currently    Comment: 3-4 "forties" 40 oz malt liquor roughly 3 days per week   Drug use: Yes    Frequency: 7.0 times per week    Types: "Crack" cocaine, Marijuana    Comment: Marijuana 3x/wk. Crack 2023    Family History  Problem Relation Age of Onset   Alcoholism Mother    Hypertension Mother    Breast cancer Mother    Alcoholism Father    Stroke Maternal Grandmother    Hypertension Maternal Grandmother    Diabetes Maternal Grandmother         12/13/2023    9:58 AM 08/15/2023    3:06 PM 05/30/2023   10:45 AM 05/02/2023   10:03 AM  GAD 7 : Generalized Anxiety Score  Nervous, Anxious, on Edge 0 1 0 2  Control/stop worrying 0 1 0 3  Worry too much - different things 0 1 0 3  Trouble relaxing 0 0 0 3  Restless 0 0 0 3  Easily annoyed or irritable 0 0 0 3  Afraid - awful might happen 0 0 0 0  Total GAD 7 Score 0 3 0 17  Anxiety Difficulty Not difficult at all Not difficult at all Not difficult at all Extremely difficult       12/13/2023    9:57 AM 08/15/2023    3:06 PM 05/30/2023   10:45 AM  Depression screen PHQ 2/9  Decreased Interest 0 0 0  Down, Depressed, Hopeless 0 2 2  PHQ - 2 Score 0 2 2  Altered sleeping 0 0 0   Tired, decreased energy 2 0 0  Change in appetite 0 0 3  Feeling bad or failure about yourself  0 0 2  Trouble concentrating 0 0 2  Moving slowly or fidgety/restless 0 0 2  Suicidal thoughts 0  0  PHQ-9 Score 2 2 11   Difficult doing work/chores Not difficult at all Not difficult at all Very difficult    BP Readings from Last 3 Encounters:  12/13/23 102/74  09/12/23 108/74  08/15/23 (!) 122/56  Wt Readings from Last 3 Encounters:  12/13/23 196 lb (88.9 kg)  09/12/23 199 lb (90.3 kg)  08/15/23 196 lb (88.9 kg)    BP 102/74   Pulse 84   Temp 97.9 F (36.6 C)   Ht 5\' 10"  (1.778 m)   Wt 196 lb (88.9 kg)   SpO2 95%   BMI 28.12 kg/m   Physical Exam Vitals and nursing note reviewed.  Constitutional:      Appearance: Normal appearance.  Eyes:     Comments: Left exotropia  Neck:     Vascular: No carotid bruit.  Cardiovascular:     Rate and Rhythm: Normal rate and regular rhythm.     Heart sounds: No murmur heard.    No friction rub. No gallop.  Pulmonary:     Effort: Pulmonary effort is normal.     Breath sounds: Normal breath sounds.  Abdominal:     General: There is no distension.  Musculoskeletal:        General: Normal range of motion.     Comments: Strength 5/5 for flexion/extension of bilateral upper and lower extremity. Mild weakness in right hand grip strength compared to left.   Skin:    General: Skin is warm and dry.  Neurological:     Mental Status: He is alert and oriented to person, place, and time.     Gait: Gait is intact.     Comments: Gait intact without assistance. Some speech difficulty apparent, but patient is mostly communicative and easy to understand.    Psychiatric:        Mood and Affect: Mood and affect normal.     Recent Labs     Component Value Date/Time   NA 143 08/15/2023 1616   NA 137 02/14/2015 1915   K 4.4 08/15/2023 1616   K 3.7 02/14/2015 1915   CL 107 (H) 08/15/2023 1616   CL 107 02/14/2015 1915   CO2 24 08/15/2023  1616   CO2 21 (L) 02/14/2015 1915   GLUCOSE 86 08/15/2023 1616   GLUCOSE 96 03/28/2023 1038   GLUCOSE 85 02/14/2015 1915   BUN 11 08/15/2023 1616   BUN 12 02/14/2015 1915   CREATININE 0.96 08/15/2023 1616   CREATININE 0.88 02/14/2015 1915   CALCIUM 9.1 08/15/2023 1616   CALCIUM 9.3 02/14/2015 1915   PROT 7.2 08/15/2023 1616   PROT 8.3 (H) 02/14/2015 1915   ALBUMIN 4.3 08/15/2023 1616   ALBUMIN 4.7 02/14/2015 1915   AST 16 08/15/2023 1616   AST 25 02/14/2015 1915   ALT 14 08/15/2023 1616   ALT 32 02/14/2015 1915   ALKPHOS 88 08/15/2023 1616   ALKPHOS 62 02/14/2015 1915   BILITOT <0.2 08/15/2023 1616   BILITOT 0.3 02/14/2015 1915   GFRNONAA >60 03/28/2023 1038   GFRNONAA >60 02/14/2015 1915   GFRAA >60 07/17/2020 1338   GFRAA >60 02/14/2015 1915    Lab Results  Component Value Date   WBC 9.3 08/15/2023   HGB 13.8 08/15/2023   HCT 42.8 08/15/2023   MCV 86 08/15/2023   PLT 228 08/15/2023   Lab Results  Component Value Date   HGBA1C 5.6 03/23/2023   Lab Results  Component Value Date   CHOL 111 08/15/2023   HDL 52 08/15/2023   LDLCALC 43 08/15/2023   TRIG 76 08/15/2023   CHOLHDL 2.1 08/15/2023   Lab Results  Component Value Date   TSH 4.040 03/28/2021     Assessment and Plan:  1. Hemiparesis affecting right  side as late effect of cerebrovascular accident (CVA) (HCC) (Primary) Stable.  Emphasized the importance of following up with neurology.  Again gave him the contact information for this, wrote it down for him, told him to call today.  Also resubmitting referral for PT.  I do not think PT is covered by his insurance.  Will likely require transportation assistance.  - Ambulatory referral to Physical Therapy  2. Tobacco use disorder Patient advised that smoking cessation is probably the single best thing he could do to reduce his risk of future stroke and improve his health overall.  He is precontemplative.  Says he is going to "drive until the wheels fall  off"  3. Noncompliance Emphasized the importance of specialist follow-up, attending appointments, keeping good communication, etc.   Return in about 3 months (around 03/11/2024).    Alvester Morin, PA-C, DMSc, Nutritionist [facility] [phone]

## 2023-12-23 NOTE — Telephone Encounter (Signed)
 Spoke with patient briefly.  Dr. Georjean Mode is his psychiatrist.  Patient states that he has not at home right now and will need to call me back later.

## 2023-12-23 NOTE — Telephone Encounter (Signed)
 Copied from CRM [id number]. Topic: Clinical - Medication Question >> Dec 23, 2023  1:11 PM Tiffany S wrote: Reason for CRM: Patient mom call patient is out of all of his meds except Ingrezza patient ask that they can sent to [place] and have Dr Mordecai Maes call Dr Georjean Mode

## 2023-12-26 ENCOUNTER — Other Ambulatory Visit: Payer: Self-pay | Admitting: Physician Assistant

## 2023-12-26 ENCOUNTER — Telehealth: Payer: Self-pay

## 2023-12-26 DIAGNOSIS — Z8639 Personal history of other endocrine, nutritional and metabolic disease: Secondary | ICD-10-CM

## 2023-12-26 MED ORDER — BENZTROPINE MESYLATE 1 MG PO TABS: 1.0000 mg | ORAL_TABLET | Freq: Every day | ORAL | 0 refills | Status: DC

## 2023-12-26 MED ORDER — ACIDOPHILUS PO CAPS: 1.0000 | ORAL_CAPSULE | Freq: Two times a day (BID) | ORAL | 2 refills | Status: DC

## 2023-12-26 MED ORDER — ESCITALOPRAM OXALATE 20 MG PO TABS
20.0000 mg | ORAL_TABLET | Freq: Every day | ORAL | 0 refills | Status: DC
Start: 2023-12-26 — End: 2023-12-27

## 2023-12-26 MED ORDER — ATORVASTATIN CALCIUM 40 MG PO TABS: 40.0000 mg | ORAL_TABLET | Freq: Every day | ORAL | 2 refills | Status: DC

## 2023-12-26 MED ORDER — DIVALPROEX SODIUM ER 250 MG PO TB24
750.0000 mg | ORAL_TABLET | Freq: Every day | ORAL | 0 refills | Status: DC
Start: 2023-12-26 — End: 2023-12-27

## 2023-12-26 MED ORDER — MIRTAZAPINE 45 MG PO TABS: 45.0000 mg | ORAL_TABLET | Freq: Every day | ORAL | 0 refills | Status: DC

## 2023-12-26 MED ORDER — LOPERAMIDE HCL 2 MG PO TABS
4.0000 mg | ORAL_TABLET | Freq: Three times a day (TID) | ORAL | 2 refills | Status: DC | PRN
Start: 2023-12-26 — End: 2023-12-27

## 2023-12-26 MED ORDER — ASPIRIN 81 MG PO CHEW: 81.0000 mg | CHEWABLE_TABLET | Freq: Every day | ORAL | 2 refills | Status: DC

## 2023-12-26 MED ORDER — ARIPIPRAZOLE 30 MG PO TABS: 30.0000 mg | ORAL_TABLET | Freq: Every day | ORAL | 0 refills | Status: DC

## 2023-12-26 NOTE — Telephone Encounter (Signed)
 Copied from CRM [id number]. Topic: General - Other >> Dec 25, 2023  4:59 PM Kristie Cowman wrote: Reason for CRM: Patient is completely out of all of his medications because the current pharmacy will not refill them due to insurance.  Patient requests all medications go to [facility].  Patient has been out of medications since the beginning of this month.  Patient requesting Dr. Mordecai Maes send all future prescriptions to [facility].   Lupita Leash [phone]

## 2023-12-26 NOTE — Telephone Encounter (Signed)
 Please review. Pt is out of medication and wants refills sent to [facility] order. Some medications should have ran out 09/2023.  KP

## 2023-12-26 NOTE — Telephone Encounter (Signed)
 Medications sent to [place].  KP

## 2023-12-27 ENCOUNTER — Other Ambulatory Visit: Payer: Self-pay | Admitting: Physician Assistant

## 2023-12-27 MED ORDER — ARIPIPRAZOLE 30 MG PO TABS: 30.0000 mg | ORAL_TABLET | Freq: Every day | ORAL | 0 refills | Status: DC

## 2023-12-27 MED ORDER — ATORVASTATIN CALCIUM 40 MG PO TABS: 40.0000 mg | ORAL_TABLET | Freq: Every day | ORAL | 2 refills | Status: DC

## 2023-12-27 MED ORDER — ESCITALOPRAM OXALATE 20 MG PO TABS: 20.0000 mg | ORAL_TABLET | Freq: Every day | ORAL | 0 refills | Status: DC

## 2023-12-27 MED ORDER — BENZTROPINE MESYLATE 1 MG PO TABS: 1.0000 mg | ORAL_TABLET | Freq: Every day | ORAL | 0 refills | Status: DC

## 2023-12-27 MED ORDER — LOPERAMIDE HCL 2 MG PO TABS: 4.0000 mg | ORAL_TABLET | Freq: Three times a day (TID) | ORAL | 2 refills | Status: AC | PRN

## 2023-12-27 MED ORDER — MIRTAZAPINE 45 MG PO TABS: 45.0000 mg | ORAL_TABLET | Freq: Every day | ORAL | 0 refills | Status: DC

## 2023-12-27 MED ORDER — ASPIRIN 81 MG PO CHEW: 81.0000 mg | CHEWABLE_TABLET | Freq: Every day | ORAL | 2 refills | Status: AC

## 2023-12-27 MED ORDER — ACIDOPHILUS PO CAPS: 1.0000 | ORAL_CAPSULE | Freq: Two times a day (BID) | ORAL | 2 refills | Status: AC

## 2023-12-27 MED ORDER — DIVALPROEX SODIUM ER 250 MG PO TB24: 750.0000 mg | ORAL_TABLET | Freq: Every day | ORAL | 0 refills | Status: DC

## 2023-12-27 NOTE — Telephone Encounter (Signed)
 Mother requesting refills sent to [facility] in [place], [place]. Medication pending for authorization.   Copied from CRM [id number]. Topic: Clinical - Medication Question >> Dec 27, 2023 12:19 PM Yolanda T wrote: Reason for CRM: patients mom Lupita Leash called stated the medications were sent to the wrong pharmacy in [place]. Please have script for all med request on yesterday sent to the [facility] [address], [place] - [address] Patient is out of all his medications and need it today as he has been out of meds for a while.

## 2023-12-27 NOTE — Telephone Encounter (Signed)
 All medications sent to correct pharmacy.  KP  Copied from CRM [id number]. Topic: Clinical - Prescription Issue >> Dec 27, 2023 12:07 PM Fuller Mandril wrote: Reason for CRM: Patient called states she requested refills and was told they were sent but pharmacy does not have them. She wanted them sent to [facility] in [place]  Address: [address], [place], [place] [address] Phone: [phone] Looks like they may have been sent to a different location. Thank You

## 2024-01-02 ENCOUNTER — Other Ambulatory Visit: Payer: Self-pay | Admitting: Physician Assistant

## 2024-01-02 NOTE — Telephone Encounter (Signed)
 Called pt let him know the medication was sent in on 3/7. Told pt to call pharmacy. Pt verbalized understanding.  KP  Copied from CRM [id number]. Topic: Clinical - Medication Refill >> Jan 02, 2024 11:08 AM [name] wrote: Most Recent Primary Care Visit:  Provider: Remo Lipps  Department: [facility]  Visit Type: OFFICE VISIT  Date: 12/13/2023  Medication: benztropine (COGENTIN) 1 MG tablet, divalproex (DEPAKOTE ER) 250 MG 24 hr tablet   Has the patient contacted their pharmacy? No (Agent: If no, request that the patient contact the pharmacy for the refill. If patient does not wish to contact the pharmacy document the reason why and proceed with request.) (Agent: If yes, when and what did the pharmacy advise?) Patient has an appt with Dr. Malvin Johns but he is out of his medication is is needing a refill until he can see the provider that the medication has been transferred over to.  Is this the correct pharmacy for this prescription? Yes If no, delete pharmacy and type the correct one.  This is the patient's preferred pharmacy:  [id number] - [place], [place] - [address] [place] [address] Phone: [phone] Fax: [phone]  Has the prescription been filled recently? No  Is the patient out of the medication? Yes  Has the patient been seen for an appointment in the last year OR does the patient have an upcoming appointment? Yes  Can we respond through MyChart? Yes  Agent: Please be advised that Rx refills may take up to 3 business days. We ask that you follow-up with your pharmacy.

## 2024-01-02 NOTE — Telephone Encounter (Signed)
 Copied from CRM [id number]. Topic: Clinical - Medication Refill >> Jan 02, 2024 11:08 AM [name] wrote: Most Recent Primary Care Visit:  Provider: Remo Lipps  Department: [facility]  Visit Type: OFFICE VISIT  Date: 12/13/2023  Medication: benztropine (COGENTIN) 1 MG tablet, divalproex (DEPAKOTE ER) 250 MG 24 hr tablet   Has the patient contacted their pharmacy? No (Agent: If no, request that the patient contact the pharmacy for the refill. If patient does not wish to contact the pharmacy document the reason why and proceed with request.) (Agent: If yes, when and what did the pharmacy advise?) Patient has an appt with Dr. Malvin Johns but he is out of his medication is is needing a refill until he can see the provider that the medication has been transferred over to.  Is this the correct pharmacy for this prescription? Yes If no, delete pharmacy and type the correct one.  This is the patient's preferred pharmacy:  [id number] - [place], [place] - [address] [place] [address] Phone: [phone] Fax: [phone]  Has the prescription been filled recently? No  Is the patient out of the medication? Yes  Has the patient been seen for an appointment in the last year OR does the patient have an upcoming appointment? Yes  Can we respond through MyChart? Yes  Agent: Please be advised that Rx refills may take up to 3 business days. We ask that you follow-up with your pharmacy.

## 2024-01-14 ENCOUNTER — Ambulatory Visit (INDEPENDENT_AMBULATORY_CARE_PROVIDER_SITE_OTHER): Payer: MEDICAID

## 2024-01-14 ENCOUNTER — Other Ambulatory Visit: Payer: Self-pay | Admitting: Podiatry

## 2024-01-14 ENCOUNTER — Other Ambulatory Visit: Payer: Self-pay

## 2024-01-14 ENCOUNTER — Ambulatory Visit: Payer: MEDICAID | Admitting: Podiatry

## 2024-01-14 ENCOUNTER — Telehealth: Payer: Self-pay | Admitting: Physician Assistant

## 2024-01-14 DIAGNOSIS — Z8673 Personal history of transient ischemic attack (TIA), and cerebral infarction without residual deficits: Secondary | ICD-10-CM

## 2024-01-14 DIAGNOSIS — D492 Neoplasm of unspecified behavior of bone, soft tissue, and skin: Secondary | ICD-10-CM | POA: Diagnosis not present

## 2024-01-14 DIAGNOSIS — R296 Repeated falls: Secondary | ICD-10-CM

## 2024-01-14 DIAGNOSIS — F809 Developmental disorder of speech and language, unspecified: Secondary | ICD-10-CM

## 2024-01-14 NOTE — Telephone Encounter (Signed)
 Copied from CRM [id number]. Topic: Clinical - Home Health Verbal Orders >> Jan 14, 2024 10:22 AM Loralie Champagne wrote: Caller/Agency: Agustin Cree from [facility] Callback Number: [phone] Service Requested: Occupational Therapy and Speech therapy Frequency: ? Any new concerns about the patient? no

## 2024-01-14 NOTE — Progress Notes (Addendum)
 Subjective:  Patient ID: Brian Rios, male    DOB: 06-03-1981,  MRN: [id number]  Chief Complaint  Patient presents with   Skin neoplasm    43 y.o. male presents with the above complaint.  Patient presents with bilateral submetatarsal 5 porokeratotic lesion.  Patient states painful to touch he would like to surgically remove it.  He states that he has been dealing with this for quite some time is failed shoe gear modification padding protecting offloading and would like to do a surgical lesion left the bone.  Denies any other acute issues.   Review of Systems: Negative except as noted in the HPI. Denies N/V/F/Ch.  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   left cerebrum   Erectile dysfunction    Heart murmur    Hyperlipidemia    Schizophrenia (HCC)    Tobacco abuse     Current Outpatient Medications:    ABILIFY MAINTENA 400 MG PRSY prefilled syringe, SMARTSIG:1 Each IM Every 4 Weeks, Disp: , Rfl:    acetaminophen (TYLENOL) 325 MG tablet, Take 2 tablets (650 mg total) by mouth every 4 (four) hours as needed for mild pain (or temp > 37.5 C (99.5 F))., Disp: , Rfl:    ARIPiprazole (ABILIFY) 30 MG tablet, Take 1 tablet (30 mg total) by mouth at bedtime., Disp: 90 tablet, Rfl: 0   aspirin 81 MG chewable tablet, Chew 1 tablet (81 mg total) by mouth daily., Disp: 90 tablet, Rfl: 2   atorvastatin (LIPITOR) 40 MG tablet, Take 1 tablet (40 mg total) by mouth daily., Disp: 90 tablet, Rfl: 2   benztropine (COGENTIN) 1 MG tablet, Take 1 tablet (1 mg total) by mouth at bedtime., Disp: 90 tablet, Rfl: 0   divalproex (DEPAKOTE ER) 250 MG 24 hr tablet, Take 3 tablets (750 mg total) by mouth at bedtime., Disp: 270 tablet, Rfl: 0   escitalopram (LEXAPRO) 20 MG tablet, Take 1 tablet (20 mg total) by mouth daily., Disp: 90 tablet, Rfl: 0   Lactobacillus (ACIDOPHILUS) CAPS capsule, Take 1  capsule by mouth in the morning and at bedtime., Disp: 180 capsule, Rfl: 2   loperamide (IMODIUM A-D) 2 MG tablet, Take 2 tablets (4 mg total) by mouth 3 (three) times daily as needed for diarrhea or loose stools., Disp: 90 tablet, Rfl: 2   mirtazapine (REMERON) 45 MG tablet, Take 1 tablet (45 mg total) by mouth at bedtime., Disp: 90 tablet, Rfl: 0   valbenazine (INGREZZA) 80 MG capsule, Take 1 capsule (80 mg total) by mouth daily., Disp: 90 capsule, Rfl: 0  Social History   Tobacco Use  Smoking Status Every Day   Current packs/day: 0.00   Average packs/day: 1 pack/day for 15.0 years (15.0 ttl pk-yrs)   Types: Cigars, Cigarettes   Start date: 03/2023   Last attempt to quit: 03/2023   Years since quitting: 0.8   Passive exposure: Current  Smokeless Tobacco Never  Tobacco Comments   Formerly cigarettes x15y. Now smoking about 5 cigars per day, no cigarettes    No Known Allergies Objective:  There were no vitals filed for this visit. There is no height or weight on file to calculate BMI. Constitutional Well developed. Well nourished.  Vascular Dorsalis pedis pulses palpable bilaterally. Posterior tibial pulses palpable bilaterally. Capillary refill normal to all digits.  No cyanosis or clubbing noted. Pedal hair growth  normal.  Neurologic Normal speech. Oriented to person, place, and time. Epicritic sensation to light touch grossly present bilaterally.  Dermatologic Bilateral submetatarsal 5 porokeratotic lesion central nucleated core noted.  Bilateral plantarflexed fifth metatarsal noted pain on palpation.  No pinpoint bleeding noted upon debridement  Orthopedic: Normal joint ROM without pain or crepitus bilaterally. No visible deformities. No bony tenderness.   Radiographs: 3 views of skeletally mature adult bilateral foot: Plantarflexed fifth metatarsal noted with increase in metatarsal declination angle bilaterally. Assessment:   1. Skin neoplasm     Plan:  Patient was  evaluated and treated and all questions answered.  Bilateral submetatarsal 5 porokeratotic lesion/skin neoplasm with underlying plantarflexed fifth metatarsal -All questions and concerns were discussed with the patient in extensive detail that patient continues to hurt we would like to discuss surgical options at this time -I discussed bilateral floating osteotomy of the fifth metatarsal I discussed my preoperative intra or postoperative plan in extensive detail he states understand like to proceed with surgery -Informed surgical risk consent was reviewed and read aloud to the patient.  I reviewed the films.  I have discussed my findings with the patient in great detail.  I have discussed all risks including but not limited to infection, stiffness, scarring, limp, disability, deformity, damage to blood vessels and nerves, numbness, poor healing, need for braces, arthritis, chronic pain, amputation, death.  All benefits and realistic expectations discussed in great detail.  I have made no promises as to the outcome.  I have provided realistic expectations.  I have offered the patient a 2nd opinion, which they have declined and assured me they preferred to proceed despite the risks   No follow-ups on file.

## 2024-01-14 NOTE — Telephone Encounter (Signed)
 Referrals placed  KP

## 2024-01-14 NOTE — Telephone Encounter (Signed)
 Spoke to [employer] gave a verbal order. She wants [place] to send over a referral OT, Speech therapy. She evrba;ized understanidng.  KP

## 2024-01-14 NOTE — Telephone Encounter (Signed)
Verbalized

## 2024-01-14 NOTE — Addendum Note (Signed)
 Addended by: Myriam Jacobson on: 01/14/2024 10:09 AM   Modules accepted: Orders

## 2024-01-16 ENCOUNTER — Ambulatory Visit: Payer: MEDICAID | Admitting: Physical Therapy

## 2024-01-16 ENCOUNTER — Telehealth: Payer: Self-pay | Admitting: Urology

## 2024-01-16 NOTE — Telephone Encounter (Signed)
 Received sx consent from [facility] office, called pt to schedule sx with Dr. Allena Katz, Pt didn't answer and VM was not set up.

## 2024-01-29 ENCOUNTER — Ambulatory Visit: Payer: MEDICAID | Attending: Physician Assistant | Admitting: Physical Therapy

## 2024-01-29 ENCOUNTER — Ambulatory Visit: Payer: MEDICAID | Admitting: Physical Therapy

## 2024-01-29 DIAGNOSIS — I69351 Hemiplegia and hemiparesis following cerebral infarction affecting right dominant side: Secondary | ICD-10-CM | POA: Insufficient documentation

## 2024-01-29 DIAGNOSIS — R262 Difficulty in walking, not elsewhere classified: Secondary | ICD-10-CM | POA: Insufficient documentation

## 2024-01-29 DIAGNOSIS — R2689 Other abnormalities of gait and mobility: Secondary | ICD-10-CM | POA: Insufficient documentation

## 2024-01-29 DIAGNOSIS — M6281 Muscle weakness (generalized): Secondary | ICD-10-CM | POA: Insufficient documentation

## 2024-01-29 NOTE — Therapy (Deleted)
 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   left cerebrum   Erectile dysfunction    Heart murmur    Hyperlipidemia    Schizophrenia (HCC)    Tobacco abuse    Past Surgical History:  Procedure Laterality Date   BOWEL RESECTION N/A 09/04/2022   Procedure: SMALL BOWEL RESECTION, open;  Surgeon: Leafy Ro, MD;  Location: [facility] ORS;  Service: General;  Laterality: N/A;   IR CT HEAD LTD  03/21/2023   IR CT HEAD LTD  03/21/2023   IR PERCUTANEOUS ART THROMBECTOMY/INFUSION INTRACRANIAL INC DIAG ANGIO  03/21/2023   ORIF ORBITAL FRACTURE  1985   PARTIAL COLECTOMY Right 09/04/2022   Procedure: PARTIAL COLECTOMY;  Surgeon: Leafy Ro, MD;  Location: [facility] ORS;  Service: General;  Laterality: Right;  Right colectimy   RADIOLOGY WITH ANESTHESIA N/A 03/21/2023   Procedure: RADIOLOGY WITH ANESTHESIA;  Surgeon: Radiologist, Medication, MD;  Location: [facility] OR;  Service: Radiology;  Laterality: N/A;   TEE WITHOUT CARDIOVERSION N/A 07/19/2020   Procedure: TRANSESOPHAGEAL ECHOCARDIOGRAM (TEE);  Surgeon: Lamar Blinks, MD;  Location: [facility] ORS;  Service: Cardiovascular;  Laterality: N/A;   WRIST SURGERY Left 11/07/2013   REPAIR ULNAR ARTERY, NERVE AND TENDON   Patient Active Problem List   Diagnosis Date Noted   Speech and language deficits 04/22/2023   Hemiparesis affecting right side as late effect of cerebrovascular accident (CVA) (HCC) 04/22/2023   Spasticity 04/22/2023   Chronic diarrhea 04/22/2023   Chronic schizophrenia (HCC) 04/01/2023   S/P right colectomy 09/04/2022   Dental decay 06/12/2022   Erectile dysfunction 07/26/2021   Hyperlipidemia LDL goal <70 03/28/2021   History of embolic stroke 07/18/2020   Drug abuse (HCC)  07/17/2020   Major depressive disorder, recurrent episode, severe (HCC) 02/20/2015   Alcohol use disorder, severe, dependence (HCC) 02/20/2015   Stimulant use disorder 02/20/2015   Tobacco use disorder 02/20/2015    PCP: Melton Alar. Mordecai Maes, [place]  REFERRING PROVIDER: Melton Alar. [place], [place]  REFERRING DIAGNOSIS: [phone] (ICD-10-CM) - Hemiparesis affecting right side as late effect of cerebrovascular accident (CVA) (HCC)   THERAPY DIAG: Hemiplegia and hemiparesis following cerebral infarction affecting right dominant side (HCC)  Difficulty in walking, not elsewhere classified  Muscle weakness (generalized)  Other abnormalities of gait and mobility  ONSET DATE: ***  FOLLOW UP APPT WITH PROVIDER: {yes/no:20286}    RATIONALE FOR EVALUATION AND TREATMENT: Rehabilitation  SUBJECTIVE:  Chief Complaint: 43 y.o. 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   Pertinent History  43 y.o. 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   Pt currently referred for R hemiparesis afte stroke. Pt also has referrals for speech and OT.   Pain: {yes/no:20286} Numbness/Tingling: {yes/no:20286} Focal Weakness: {yes/no:20286} Recent changes in overall health/medication: {yes/no:20286} Prior history of physical therapy for balance:  {yes/no:20286} Falls: Has patient fallen in  last 6 months? {yes/no:20286}, Number of falls: *** Directional pattern for falls: {yes/no:20286} Dominant hand: {RIGHT/LEFT:20294} Imaging: {yes/no:20286}  Prior level of function: {PLOF:24004} Occupational demands: Hobbies: Red flags (bowel/bladder changes, saddle paresthesia, personal history of cancer, h/o spinal tumors, h/o compression fx, h/o abdominal aneurysm, abdominal pain, chills/fever, night sweats, nausea, vomiting, unrelenting pain): Negative  Precautions: None  Weight Bearing Restrictions: No  Living Environment Lives with: {[facility] lives with:25569::"lives with their family"} Lives in: {Lives in:25570} Has following equipment at home: {Assistive devices:23999}   Patient Goals: ***    OBJECTIVE:   Patient Surveys  ABC:     Cognition Patient is oriented to person, place, and time.  Recent memory is intact.  Remote memory is intact.  Attention span and concentration are intact.  Expressive speech is intact.  Patient's fund of knowledge is within normal limits for educational level.     Gross Musculoskeletal Assessment Tremor: None Bulk: Normal Tone: Normal   GAIT: Distance walked: *** Assistive device utilized: {Assistive devices:23999} Level of assistance: {Levels of assistance:24026} Comments: ***   Posture: No gross abnormalities noted in standing or seated posture    AROM  AROM (Normal range in degrees) AROM  01/29/2024  Lumbar   Flexion (65)   Extension (30)   Right lateral flexion (25)   Left lateral flexion (25)   Right rotation (30)   Left rotation (30)       Hip Right Left  Flexion (125)    Extension (15)    Abduction (40)    Adduction     Internal Rotation (45)    External Rotation (45)        Knee    Flexion (135)    Extension (0)        Ankle    Dorsiflexion (20)    Plantarflexion (50)    Inversion (35)    Eversion (15    (* = pain; Blank rows = not tested)   LE MMT:  MMT (out of 5) Right 01/29/2024  Left 01/29/2024  Hip flexion    Hip extension    Hip abduction    Hip adduction    Hip internal rotation    Hip external rotation    Knee flexion    Knee extension    Ankle dorsiflexion    Ankle plantarflexion    Ankle inversion    Ankle eversion    (* = pain; Blank rows = not tested)   Sensation Grossly intact to light touch bilateral LEs as determined by testing dermatomes L2-S2. Proprioception, and hot/cold testing deferred on this date.   Reflexes R/L Knee Jerk (L3/4): 2+/2+  Ankle Jerk (S1/2): 2+/2+    Cranial  Nerves Visual acuity and visual fields are intact  Extraocular muscles are intact  Facial sensation is intact bilaterally  Facial strength is intact bilaterally  Hearing is normal as tested by gross conversation Palate elevates midline, normal phonation  Shoulder shrug strength is intact  Tongue protrudes midline   Coordination/Cerebellar Finger to Nose: WNL Heel to Shin: WNL Rapid alternating movements: WNL Finger Opposition: WNL Pronator Drift: Negative   Modified Ashworth Scale Biceps: R , L  Wrist flexors: R , L  Hamstrings: R , L  Gastrocnemius: R , L   0 No increase in tone  1 slight increase in tone giving a catch when slight increase in muscle tone, manifested by the limb was moved in flexion or extension.  1+ slight increase in muscle tone, manifested by a catch followed by minimal resistance throughout (ROM )  2 more marked increase in tone but more marked increased in muscle tone through most limb easily flexed  3 considerable increase in tone, passive movement difficult  4 limb rigid in flexion or extension     FUNCTIONAL OUTCOME MEASURES   Results Comments  BERG /56 Fall risk, in need of intervention  DGI /24   FGA /30   TUG seconds   5TSTS seconds   6 Minute Walk Test    10 Meter Gait Speed Self-selected: s = m/s; Fastest: s = m/s Below normative values for full community ambulation  (Blank rows = not  tested)    TODAY'S TREATMENT  ***   PATIENT EDUCATION:  Education details: *** Person educated: {Person educated:25204} Education method: {Education Method:25205} Education comprehension: {Education Comprehension:25206}   HOME EXERCISE PROGRAM: ***   ASSESSMENT:  CLINICAL IMPRESSION: Patient is a *** y.o. *** who was seen today for physical therapy evaluation and treatment for back pain. Objective impairments include {opptimpairments:25111}. These impairments are limiting patient from {activity limitations:25113}. Personal factors including {Personal factors:25162} are also affecting patient's functional outcome. Patient will benefit from skilled PT to address above impairments and improve overall function.  REHAB POTENTIAL: {rehabpotential:25112}  CLINICAL DECISION MAKING: {clinical decision making:25114}  EVALUATION COMPLEXITY: {Evaluation complexity:25115}   GOALS: Goals reviewed with patient? {yes/no:20286}  SHORT TERM GOALS: Target date: {follow up:25551}  Pt will be independent with HEP in order to improve strength and balance in order to decrease fall risk and improve function at home. Baseline: *** Goal status: INITIAL   LONG TERM GOALS: Target date: {follow up:25551}  Pt will increase FOTO to at least *** to demonstrate significant improvement in function at home related to balance  Baseline:  Goal status: INITIAL  2.  Pt will improve BERG by at least 3 points in order to demonstrate clinically significant improvement in balance.   Baseline: *** Goal status: INITIAL  3.  Pt will improve ABC by at least 13% in order to demonstrate clinically significant improvement in balance confidence.      Baseline: *** Goal status: INITIAL  4. Pt will decrease 5TSTS by at least 3 seconds in order to demonstrate clinically significant improvement in LE strength      Baseline: *** Goal status: INITIAL  5. Pt will improve DGI by at least 3 points in order to  demonstrate clinically significant improvement in balance and decreased risk for falls.     Baseline: *** Goal status: INITIAL  6. Pt will decrease TUG to below 14 seconds/decrease in order to demonstrate decreased fall risk.  Baseline: *** Goal status: INITIAL  7. Pt will increase by at least  23m (114ft) in order to demonstrate clinically significant improvement in cardiopulmonary endurance and community ambulation   Baseline: *** Goal status: INITIAL   PLAN: PT FREQUENCY: 2x/week  PT DURATION: 12 weeks  PLANNED INTERVENTIONS: Therapeutic exercises, Therapeutic activity, Neuromuscular re-education, Balance training, Gait training, Patient/Family education, Joint manipulation, Joint mobilization, Canalith repositioning, Aquatic Therapy, Dry Needling, Cognitive remediation, Electrical stimulation, Spinal manipulation, Spinal mobilization, Cryotherapy, Moist heat, Traction, Ultrasound, Ionotophoresis 4mg /ml Dexamethasone, and Manual therapy  PLAN FOR NEXT SESSION: ***   Gertie Exon 01/29/2024, 9:31 AM

## 2024-01-30 ENCOUNTER — Ambulatory Visit: Payer: MEDICAID | Admitting: Podiatry

## 2024-02-24 ENCOUNTER — Ambulatory Visit: Payer: MEDICAID | Admitting: Physical Therapy

## 2024-02-24 NOTE — Therapy (Deleted)
 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   left cerebrum   Erectile dysfunction    Heart murmur    Hyperlipidemia    Schizophrenia (HCC)    Tobacco abuse    Past Surgical History:  Procedure Laterality Date   BOWEL RESECTION N/A 09/04/2022   Procedure: SMALL BOWEL RESECTION, open;  Surgeon: Alben Alma, MD;  Location: [facility];  Service: General;  Laterality: N/A;   IR CT HEAD LTD  03/21/2023   IR CT HEAD LTD  03/21/2023   IR PERCUTANEOUS ART THROMBECTOMY/INFUSION INTRACRANIAL INC DIAG ANGIO  03/21/2023   ORIF ORBITAL FRACTURE  1985   PARTIAL COLECTOMY Right 09/04/2022   Procedure: PARTIAL COLECTOMY;  Surgeon: Alben Alma, MD;  Location: [facility];  Service: General;  Laterality: Right;  Right colectimy   RADIOLOGY WITH ANESTHESIA N/A 03/21/2023   Procedure: RADIOLOGY WITH ANESTHESIA;  Surgeon: Radiologist, Medication, MD;  Location: [facility];  Service: Radiology;  Laterality: N/A;   TEE WITHOUT CARDIOVERSION N/A 07/19/2020   Procedure: TRANSESOPHAGEAL ECHOCARDIOGRAM (TEE);  Surgeon: Michelle Aid, MD;  Location: [facility];  Service: Cardiovascular;  Laterality: N/A;   WRIST SURGERY Left 11/07/2013   REPAIR ULNAR ARTERY, NERVE AND TENDON   Patient Active Problem List   Diagnosis Date Noted   Speech and language deficits 04/22/2023   Hemiparesis affecting right side as late effect of cerebrovascular accident (CVA) (HCC) 04/22/2023   Spasticity 04/22/2023   Chronic diarrhea 04/22/2023   Chronic schizophrenia (HCC) 04/01/2023   S/P right colectomy 09/04/2022   Dental decay 06/12/2022   Erectile dysfunction 07/26/2021   Hyperlipidemia LDL goal <70 03/28/2021   History of embolic stroke 07/18/2020   Drug abuse (HCC)  07/17/2020   Major depressive disorder, recurrent episode, severe (HCC) 02/20/2015   Alcohol use disorder, severe, dependence (HCC) 02/20/2015   Stimulant use disorder 02/20/2015   Tobacco use disorder 02/20/2015    PCP: Arleen Lacer. Larkin [place], [place]  REFERRING PROVIDER: Arleen Lacer. [place], [place]  REFERRING DIAGNOSIS: [phone] (ICD-10-CM) - Hemiparesis affecting right side as late effect of cerebrovascular accident (CVA) (HCC)   THERAPY DIAG: Hemiparesis affecting right side as late effect of cerebrovascular accident (HCC)  Difficulty in walking, not elsewhere classified  Muscle weakness (generalized)  Unsteadiness on feet  ONSET DATE: ***  FOLLOW UP APPT WITH PROVIDER: {yes/no:20286}    RATIONALE FOR EVALUATION AND TREATMENT: Rehabilitation  SUBJECTIVE:  Chief Complaint: 43 y.o. 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   Pertinent History  43 y.o. 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   Pt currently referred for R hemiparesis after stroke. Pt also has referrals for speech and OT.   Pain: {yes/no:20286} Numbness/Tingling: {yes/no:20286} Focal Weakness: {yes/no:20286} Recent changes in overall health/medication: {yes/no:20286} Prior history of physical therapy for balance:  {yes/no:20286} Falls: Has patient fallen in last 6 months? {yes/no:20286},  Number of falls: *** Directional pattern for falls: {yes/no:20286} Dominant hand: {RIGHT/LEFT:20294} Imaging: {yes/no:20286}  Prior level of function: {PLOF:24004} Occupational demands: Hobbies: Red flags (bowel/bladder changes, saddle paresthesia, personal history of cancer, h/o spinal tumors, h/o compression fx, h/o abdominal aneurysm, abdominal pain, chills/fever, night sweats, nausea, vomiting, unrelenting pain): Negative  Precautions: None  Weight Bearing Restrictions: No  Living Environment Lives with: {[facility] lives with:25569::"lives with their family"} Lives in: {Lives in:25570} Has following equipment at home: {Assistive devices:23999}   Patient Goals: ***    OBJECTIVE:   Patient Surveys  ABC:    Cognition Patient is oriented to person, place, and time.  Recent memory is intact.  Remote memory is intact.  Attention span and concentration are intact.  Expressive speech is intact.  Patient's fund of knowledge is within normal limits for educational level.     Gross Musculoskeletal Assessment Tremor: None Bulk: Normal Tone: Normal   GAIT: Distance walked: *** Assistive device utilized: {Assistive devices:23999} Level of assistance: {Levels of assistance:24026} Comments: ***   Posture: No gross abnormalities noted in standing or seated posture    AROM  AROM (Normal range in degrees) AROM  02/24/2024  Lumbar   Flexion (65)   Extension (30)   Right lateral flexion (25)   Left lateral flexion (25)   Right rotation (30)   Left rotation (30)       Hip Right Left  Flexion (125)    Extension (15)    Abduction (40)    Adduction     Internal Rotation (45)    External Rotation (45)        Knee    Flexion (135)    Extension (0)        Ankle    Dorsiflexion (20)    Plantarflexion (50)    Inversion (35)    Eversion (15    (* = pain; Blank rows = not tested)   LE MMT:  MMT (out of 5) Right 02/24/2024 Left 02/24/2024  Hip flexion    Hip  extension    Hip abduction    Hip adduction    Hip internal rotation    Hip external rotation    Knee flexion    Knee extension    Ankle dorsiflexion    Ankle plantarflexion    Ankle inversion    Ankle eversion    (* = pain; Blank rows = not tested)   Sensation Grossly intact to light touch bilateral LEs as determined by testing dermatomes L2-S2. Proprioception, and hot/cold testing deferred on this date.   Reflexes R/L Knee Jerk (L3/4): 2+/2+  Ankle Jerk (S1/2): 2+/2+    Cranial Nerves  Visual acuity and visual fields are intact  Extraocular muscles are intact  Facial sensation is intact bilaterally  Facial strength is intact bilaterally  Hearing is normal as tested by gross conversation Palate elevates midline, normal phonation  Shoulder shrug strength is intact  Tongue protrudes midline   Coordination/Cerebellar Finger to Nose: WNL Heel to Shin: WNL Rapid alternating movements: WNL Finger Opposition: WNL Pronator Drift: Negative   Modified Ashworth Scale Biceps: R , L  Wrist flexors: R , L  Hamstrings: R , L  Gastrocnemius: R , L   0 No increase in tone  1 slight increase in tone giving a catch when slight increase in muscle tone, manifested by the limb was moved in flexion or extension.  1+ slight increase in muscle tone, manifested by a catch followed by minimal resistance throughout (ROM )  2 more marked increase in tone but more marked increased in muscle tone through most limb easily flexed  3 considerable increase in tone, passive movement difficult  4 limb rigid in flexion or extension     FUNCTIONAL OUTCOME MEASURES   Results Comments  BERG /56 Fall risk, in need of intervention  DGI /24   FGA /30   TUG seconds   5TSTS seconds   6 Minute Walk Test    10 Meter Gait Speed Self-selected: s = m/s; Fastest: s = m/s Below normative values for full community ambulation  (Blank rows = not tested)    TODAY'S TREATMENT  ***   PATIENT  EDUCATION:  Education details: *** Person educated: {Person educated:25204} Education method: {Education Method:25205} Education comprehension: {Education Comprehension:25206}   HOME EXERCISE PROGRAM: ***   ASSESSMENT:  CLINICAL IMPRESSION: Patient is a *** y.o. *** who was seen today for physical therapy evaluation and treatment for back pain. Objective impairments include {opptimpairments:25111}. These impairments are limiting patient from {activity limitations:25113}. Personal factors including {Personal factors:25162} are also affecting patient's functional outcome. Patient will benefit from skilled PT to address above impairments and improve overall function.  REHAB POTENTIAL: {rehabpotential:25112}  CLINICAL DECISION MAKING: {clinical decision making:25114}  EVALUATION COMPLEXITY: {Evaluation complexity:25115}   GOALS: Goals reviewed with patient? {yes/no:20286}  SHORT TERM GOALS: Target date: {follow up:25551}  Pt will be independent with HEP in order to improve strength and balance in order to decrease fall risk and improve function at home. Baseline: *** Goal status: INITIAL   LONG TERM GOALS: Target date: {follow up:25551}  Pt will increase FOTO to at least *** to demonstrate significant improvement in function at home related to balance  Baseline:  Goal status: INITIAL  2.  Pt will improve BERG by at least 3 points in order to demonstrate clinically significant improvement in balance.   Baseline: *** Goal status: INITIAL  3.  Pt will improve ABC by at least 13% in order to demonstrate clinically significant improvement in balance confidence.      Baseline: *** Goal status: INITIAL  4. Pt will decrease 5TSTS by at least 3 seconds in order to demonstrate clinically significant improvement in LE strength      Baseline: *** Goal status: INITIAL  5. Pt will improve DGI by at least 3 points in order to demonstrate clinically significant improvement in balance  and decreased risk for falls.     Baseline: *** Goal status: INITIAL  6. Pt will decrease TUG to below 14 seconds/decrease in order to demonstrate decreased fall risk.  Baseline: *** Goal status: INITIAL  7. Pt will increase by at least 19m (  189ft) in order to demonstrate clinically significant improvement in cardiopulmonary endurance and community ambulation   Baseline: *** Goal status: INITIAL   PLAN: PT FREQUENCY: 2x/week  PT DURATION: 12 weeks  PLANNED INTERVENTIONS: Therapeutic exercises, Therapeutic activity, Neuromuscular re-education, Balance training, Gait training, Patient/Family education, Joint manipulation, Joint mobilization, Canalith repositioning, Aquatic Therapy, Dry Needling, Cognitive remediation, Electrical stimulation, Spinal manipulation, Spinal mobilization, Cryotherapy, Moist heat, Traction, Ultrasound, Ionotophoresis 4mg /ml Dexamethasone , and Manual therapy  PLAN FOR NEXT SESSION: ***   Aleatha Hunting 02/24/2024, 4:15 PM

## 2024-03-02 ENCOUNTER — Ambulatory Visit: Payer: MEDICAID | Attending: Physician Assistant | Admitting: Physical Therapy

## 2024-03-02 ENCOUNTER — Encounter: Payer: MEDICAID | Admitting: Physical Therapy

## 2024-03-02 DIAGNOSIS — M6281 Muscle weakness (generalized): Secondary | ICD-10-CM | POA: Insufficient documentation

## 2024-03-02 DIAGNOSIS — R262 Difficulty in walking, not elsewhere classified: Secondary | ICD-10-CM | POA: Insufficient documentation

## 2024-03-02 DIAGNOSIS — R2681 Unsteadiness on feet: Secondary | ICD-10-CM | POA: Insufficient documentation

## 2024-03-02 DIAGNOSIS — I69351 Hemiplegia and hemiparesis following cerebral infarction affecting right dominant side: Secondary | ICD-10-CM | POA: Insufficient documentation

## 2024-03-02 DIAGNOSIS — R278 Other lack of coordination: Secondary | ICD-10-CM | POA: Insufficient documentation

## 2024-03-02 DIAGNOSIS — I63512 Cerebral infarction due to unspecified occlusion or stenosis of left middle cerebral artery: Secondary | ICD-10-CM | POA: Insufficient documentation

## 2024-03-02 NOTE — Therapy (Deleted)
 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   left cerebrum   Erectile dysfunction    Heart murmur    Hyperlipidemia    Schizophrenia (HCC)    Tobacco abuse    Past Surgical History:  Procedure Laterality Date   BOWEL RESECTION N/A 09/04/2022   Procedure: SMALL BOWEL RESECTION, open;  Surgeon: Alben Alma, MD;  Location: [facility];  Service: General;  Laterality: N/A;   IR CT HEAD LTD  03/21/2023   IR CT HEAD LTD  03/21/2023   IR PERCUTANEOUS ART THROMBECTOMY/INFUSION INTRACRANIAL INC DIAG ANGIO  03/21/2023   ORIF ORBITAL FRACTURE  1985   PARTIAL COLECTOMY Right 09/04/2022   Procedure: PARTIAL COLECTOMY;  Surgeon: Alben Alma, MD;  Location: [facility];  Service: General;  Laterality: Right;  Right colectimy   RADIOLOGY WITH ANESTHESIA N/A 03/21/2023   Procedure: RADIOLOGY WITH ANESTHESIA;  Surgeon: Radiologist, Medication, MD;  Location: [facility];  Service: Radiology;  Laterality: N/A;   TEE WITHOUT CARDIOVERSION N/A 07/19/2020   Procedure: TRANSESOPHAGEAL ECHOCARDIOGRAM (TEE);  Surgeon: Michelle Aid, MD;  Location: [facility];  Service: Cardiovascular;  Laterality: N/A;   WRIST SURGERY Left 11/07/2013   REPAIR ULNAR ARTERY, NERVE AND TENDON   Patient Active Problem List   Diagnosis Date Noted   Speech and language deficits 04/22/2023   Hemiparesis affecting right side as late effect of cerebrovascular accident (CVA) (HCC) 04/22/2023   Spasticity 04/22/2023   Chronic diarrhea 04/22/2023   Chronic schizophrenia (HCC) 04/01/2023   S/P right colectomy 09/04/2022   Dental decay 06/12/2022   Erectile dysfunction 07/26/2021   Hyperlipidemia LDL goal <70 03/28/2021   History of embolic stroke 07/18/2020   Drug abuse  (HCC) 07/17/2020   Major depressive disorder, recurrent episode, severe (HCC) 02/20/2015   Alcohol use disorder, severe, dependence (HCC) 02/20/2015   Stimulant use disorder 02/20/2015   Tobacco use disorder 02/20/2015    PCP: Arleen Lacer. Larkin [place], [place]  REFERRING PROVIDER: Arleen Lacer. [place], [place]  REFERRING DIAGNOSIS: [phone] (ICD-10-CM) - Hemiparesis affecting right side as late effect of cerebrovascular accident (CVA) (HCC)   THERAPY DIAG: Difficulty in walking, not elsewhere classified  Muscle weakness (generalized)  Hemiplegia and hemiparesis following cerebral infarction affecting right dominant side (HCC)  ONSET DATE: 03/21/23 (ED to hospital admission, CVA)  FOLLOW UP APPT WITH PROVIDER: Yes ; 03/11/24   RATIONALE FOR EVALUATION AND TREATMENT: Rehabilitation  SUBJECTIVE:  Chief Complaint: 43 y.o. 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   Pertinent History  43 y.o. 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   Pt currently referred for R hemiparesis after stroke. Pt also has referrals for speech and OT.   Pain: {yes/no:20286} Numbness/Tingling: {yes/no:20286} Focal Weakness: {yes/no:20286} Recent changes in overall health/medication: {yes/no:20286} Prior history of physical therapy for balance:  {yes/no:20286} Falls: Has patient fallen in last  6 months? {yes/no:20286}, Number of falls: *** Directional pattern for falls: {yes/no:20286} Dominant hand: {RIGHT/LEFT:20294} Imaging: {yes/no:20286}  Prior level of function: {PLOF:24004} Occupational demands: Hobbies: Red flags (bowel/bladder changes, saddle paresthesia, personal history of cancer, h/o spinal tumors, h/o compression fx, h/o abdominal aneurysm, abdominal pain, chills/fever, night sweats, nausea, vomiting, unrelenting pain): Negative  Precautions: None  Weight Bearing Restrictions: No  Living Environment Lives with: {[facility] lives with:25569::"lives with their family"} Lives in: {Lives in:[address]70} Has following equipment at home: {Assistive devices:23999}   Patient Goals: ***    OBJECTIVE:   Patient Surveys  ABC:    Cognition Patient is oriented to person, place, and time.  Recent memory is intact.  Remote memory is intact.  Attention span and concentration are intact.  Expressive speech is intact.  Patient's fund of knowledge is within normal limits for educational level.     Gross Musculoskeletal Assessment Tremor: None Bulk: Normal Tone: Normal   GAIT: Distance walked: *** Assistive device utilized: {Assistive devices:23999} Level of assistance: {Levels of assistance:24026} Comments: ***   Posture: No gross abnormalities noted in standing or seated posture    AROM  AROM (Normal range in degrees) AROM  03/02/2024  Lumbar   Flexion (65)   Extension (30)   Right lateral flexion (25)   Left lateral flexion (25)   Right rotation (30)   Left rotation (30)       Hip Right Left  Flexion (125)    Extension (15)    Abduction (40)    Adduction     Internal Rotation (45)    External Rotation (45)        Knee    Flexion (135)    Extension (0)        Ankle    Dorsiflexion (20)    Plantarflexion (50)    Inversion (35)    Eversion (15    (* = pain; Blank rows = not tested)   LE MMT:  MMT (out of 5) Right 03/02/2024  Left 03/02/2024  Hip flexion    Hip extension    Hip abduction    Hip adduction    Hip internal rotation    Hip external rotation    Knee flexion    Knee extension    Ankle dorsiflexion    Ankle plantarflexion    Ankle inversion    Ankle eversion    (* = pain; Blank rows = not tested)   Sensation Grossly intact to light touch bilateral LEs as determined by testing dermatomes L2-S2. Proprioception, and hot/cold testing deferred on this date.   Reflexes R/L Knee Jerk (L3/4): 2+/2+  Ankle Jerk (S1/2): 2+/2+    Cranial Nerves  Visual acuity and visual fields are intact  Extraocular muscles are intact  Facial sensation is intact bilaterally  Facial strength is intact bilaterally  Hearing is normal as tested by gross conversation Palate elevates midline, normal phonation  Shoulder shrug strength is intact  Tongue protrudes midline   Coordination/Cerebellar Finger to Nose: WNL Heel to Shin: WNL Rapid alternating movements: WNL Finger Opposition: WNL Pronator Drift: Negative   Modified Ashworth Scale Biceps: R , L  Wrist flexors: R , L  Hamstrings: R , L  Gastrocnemius: R , L   0 No increase in tone  1 slight increase in tone giving a catch when slight increase in muscle tone, manifested by the limb was moved in flexion or extension.  1+ slight increase in muscle tone, manifested by a catch followed by minimal resistance throughout (ROM )  2 more marked increase in tone but more marked increased in muscle tone through most limb easily flexed  3 considerable increase in tone, passive movement difficult  4 limb rigid in flexion or extension     FUNCTIONAL OUTCOME MEASURES   Results Comments  BERG /56 Fall risk, in need of intervention  DGI /24   FGA /30   TUG seconds   5TSTS seconds   6 Minute Walk Test    10 Meter Gait Speed Self-selected: s = m/s; Fastest: s = m/s Below normative values for full community ambulation  (Blank rows = not  tested)    TODAY'S TREATMENT  ***   PATIENT EDUCATION:  Education details: *** Person educated: {Person educated:25204} Education method: {Education Method:25205} Education comprehension: {Education Comprehension:25206}   HOME EXERCISE PROGRAM: ***   ASSESSMENT:  CLINICAL IMPRESSION: Patient is a 43 y.o. male who was seen today for physical therapy evaluation and treatment for back pain. Objective impairments include {opptimpairments:25111}. These impairments are limiting patient from {activity limitations:25113}. Personal factors including {Personal factors:25162} are also affecting patient's functional outcome. Patient will benefit from skilled PT to address above impairments and improve overall function.  REHAB POTENTIAL: {rehabpotential:25112}  CLINICAL DECISION MAKING: {clinical decision making:25114}  EVALUATION COMPLEXITY: {Evaluation complexity:25115}   GOALS: Goals reviewed with patient? {yes/no:20286}  SHORT TERM GOALS: Target date: {follow up:25551}  Pt will be independent with HEP in order to improve strength and balance in order to decrease fall risk and improve function at home. Baseline: *** Goal status: INITIAL   LONG TERM GOALS: Target date: {follow up:25551}  Pt will increase FOTO to at least *** to demonstrate significant improvement in function at home related to balance  Baseline:  Goal status: INITIAL  2.  Pt will improve BERG by at least 3 points in order to demonstrate clinically significant improvement in balance.   Baseline: *** Goal status: INITIAL  3.  Pt will improve ABC by at least 13% in order to demonstrate clinically significant improvement in balance confidence.      Baseline: *** Goal status: INITIAL  4. Pt will decrease 5TSTS by at least 3 seconds in order to demonstrate clinically significant improvement in LE strength      Baseline: *** Goal status: INITIAL  5. Pt will improve DGI by at least 3 points in order to  demonstrate clinically significant improvement in balance and decreased risk for falls.     Baseline: *** Goal status: INITIAL  6. Pt will decrease TUG to below 14 seconds/decrease in order to demonstrate decreased fall risk.  Baseline: *** Goal status: INITIAL  7. Pt will increase by at least 104m (  125ft) in order to demonstrate clinically significant improvement in cardiopulmonary endurance and community ambulation   Baseline: *** Goal status: INITIAL   PLAN: PT FREQUENCY: 2x/week  PT DURATION: 12 weeks  PLANNED INTERVENTIONS: Therapeutic exercises, Therapeutic activity, Neuromuscular re-education, Balance training, Gait training, Patient/Family education, Joint manipulation, Joint mobilization, Canalith repositioning, Aquatic Therapy, Dry Needling, Cognitive remediation, Electrical stimulation, Spinal manipulation, Spinal mobilization, Cryotherapy, Moist heat, Traction, Ultrasound, Ionotophoresis 4mg /ml Dexamethasone , and Manual therapy  PLAN FOR NEXT SESSION: ***    Denese Finn, PT, DPT [phone]  Aleatha Hunting 03/02/2024, 1:09 PM

## 2024-03-04 ENCOUNTER — Encounter: Payer: MEDICAID | Admitting: Physical Therapy

## 2024-03-09 ENCOUNTER — Ambulatory Visit: Payer: MEDICAID | Admitting: Physical Therapy

## 2024-03-10 ENCOUNTER — Ambulatory Visit: Payer: MEDICAID | Admitting: Podiatry

## 2024-03-11 ENCOUNTER — Encounter: Payer: Self-pay | Admitting: Physical Therapy

## 2024-03-11 ENCOUNTER — Ambulatory Visit: Payer: MEDICAID | Admitting: Physician Assistant

## 2024-03-11 ENCOUNTER — Ambulatory Visit: Payer: MEDICAID

## 2024-03-11 DIAGNOSIS — R2681 Unsteadiness on feet: Secondary | ICD-10-CM | POA: Diagnosis present

## 2024-03-11 DIAGNOSIS — M6281 Muscle weakness (generalized): Secondary | ICD-10-CM

## 2024-03-11 DIAGNOSIS — R278 Other lack of coordination: Secondary | ICD-10-CM | POA: Diagnosis present

## 2024-03-11 DIAGNOSIS — I63512 Cerebral infarction due to unspecified occlusion or stenosis of left middle cerebral artery: Secondary | ICD-10-CM

## 2024-03-11 DIAGNOSIS — I69351 Hemiplegia and hemiparesis following cerebral infarction affecting right dominant side: Secondary | ICD-10-CM | POA: Diagnosis present

## 2024-03-11 DIAGNOSIS — R262 Difficulty in walking, not elsewhere classified: Secondary | ICD-10-CM | POA: Diagnosis present

## 2024-03-11 NOTE — Therapy (Signed)
 OUTPATIENT PHYSICAL THERAPY NEURO EVALUATION   Patient Name: Brian Rios MRN: [id number] DOB:02-Dec-1980, 43 y.o., male Today's Date: 03/11/2024   PT End of Session - 03/11/24 1030     Visit Number 1    Number of Visits 25    Date for PT Re-Evaluation 06/03/24    PT Start Time 1031    PT Stop Time 1114    PT Time Calculation (min) 43 min    Activity Tolerance Patient tolerated treatment well    Behavior During Therapy [facility] for tasks assessed/performed             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   left cerebrum   Erectile dysfunction    Heart murmur    Hyperlipidemia    Schizophrenia (HCC)    Tobacco abuse    Past Surgical History:  Procedure Laterality Date   BOWEL RESECTION N/A 09/04/2022   Procedure: SMALL BOWEL RESECTION, open;  Surgeon: Alben Alma, MD;  Location: [facility] ORS;  Service: General;  Laterality: N/A;   IR CT HEAD LTD  03/21/2023   IR CT HEAD LTD  03/21/2023   IR PERCUTANEOUS ART THROMBECTOMY/INFUSION INTRACRANIAL INC DIAG ANGIO  03/21/2023   ORIF ORBITAL FRACTURE  1985   PARTIAL COLECTOMY Right 09/04/2022   Procedure: PARTIAL COLECTOMY;  Surgeon: Alben Alma, MD;  Location: [facility] ORS;  Service: General;  Laterality: Right;  Right colectimy   RADIOLOGY WITH ANESTHESIA N/A 03/21/2023   Procedure: RADIOLOGY WITH ANESTHESIA;  Surgeon: Radiologist, Medication, MD;  Location: [facility] OR;  Service: Radiology;  Laterality: N/A;   TEE WITHOUT CARDIOVERSION N/A 07/19/2020   Procedure: TRANSESOPHAGEAL ECHOCARDIOGRAM (TEE);  Surgeon: Michelle Aid, MD;  Location: [facility] ORS;  Service: Cardiovascular;  Laterality: N/A;   WRIST SURGERY Left 11/07/2013   REPAIR ULNAR ARTERY, NERVE AND TENDON   Patient Active Problem List   Diagnosis Date Noted   Speech and language deficits 04/22/2023   Hemiparesis affecting right side as late effect of  cerebrovascular accident (CVA) (HCC) 04/22/2023   Spasticity 04/22/2023   Chronic diarrhea 04/22/2023   Chronic schizophrenia (HCC) 04/01/2023   S/P right colectomy 09/04/2022   Dental decay 06/12/2022   Erectile dysfunction 07/26/2021   Hyperlipidemia LDL goal <70 03/28/2021   History of embolic stroke 07/18/2020   Drug abuse (HCC) 07/17/2020   Major depressive disorder, recurrent episode, severe (HCC) 02/20/2015   Alcohol use disorder, severe, dependence (HCC) 02/20/2015   Stimulant use disorder 02/20/2015   Tobacco use disorder 02/20/2015    PCP: Arleen Lacer. [place], [place]  REFERRING PROVIDER: Arleen Lacer. [place], [place]  REFERRING DIAGNOSIS: I69.351 (ICD-10-CM) - Hemiparesis affecting right side as late effect of cerebrovascular accident (CVA) (HCC)   THERAPY DIAG: Left middle cerebral artery stroke (HCC)  Muscle weakness (generalized)  Other lack of coordination  Unsteadiness on feet  ONSET DATE: 03/21/23 (ED to hospital admission, CVA)  FOLLOW UP APPT WITH PROVIDER: Yes ; 03/11/24   RATIONALE FOR EVALUATION AND TREATMENT: Rehabilitation  SUBJECTIVE:  Chief Complaint:  Patient reports that he has been losing his balance while he's walking. Tends to lose balance backwards. Difficulty expressing self due to aphasia.    Pertinent History  43 y.o. 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   Pt currently referred for R hemiparesis after stroke. Pt also has referrals for speech and OT.   Pain: No Numbness/Tingling: Yes - R forearm and hand  Focal Weakness: No Recent  changes in overall health/medication: No Prior history of physical therapy for balance:  No Falls: Has patient fallen in last 6 months? 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, goats) Red flags (bowel/bladder changes, saddle paresthesia, personal history of cancer, h/o spinal tumors, h/o compression fx, h/o abdominal aneurysm, abdominal pain, chills/fever, night sweats, nausea, vomiting, unrelenting pain): Negative  Precautions: None  Weight Bearing Restrictions: No  Living Environment Lives with: lives with mother  Lives in: Mobile home Has following equipment at home: shower chair   Patient Goals: to reduce falls and to improve balance      OBJECTIVE:   Patient Surveys  ABC: 68.8% (unsure if patient fully understood meaning of questionnaire)   Cognition Patient is oriented to person, place, and time.  Recent memory is intact.  Remote memory is impaired.  Attention span and concentration are intact.  Expressive speech is impaired.      Gross Musculoskeletal Assessment Tremor: None Bulk: Normal Tone: Normal   GAIT: Distance walked: 53'  Assistive device utilized: None Level of assistance: Complete Independence Comments: drifts L/R, decreased foot clearance bilaterally intermittently, rounded shoulders   Posture: No gross abnormalities noted in standing or seated posture    AROM  AROM (Normal range in degrees) AROM  03/11/2024  Lumbar   Flexion (65)   Extension (30)   Right lateral flexion (25)   Left lateral flexion (25)   Right rotation (30)   Left rotation (30)       Hip Right Left  Flexion (125)    Extension (15)    Abduction (40)    Adduction     Internal Rotation (45)    External Rotation (45)        Knee    Flexion (135)    Extension (0)        Ankle     Dorsiflexion (20)    Plantarflexion (50)    Inversion (35)    Eversion (15    (* = pain; Blank rows = not tested)   LE MMT:  MMT (out of 5) Right 03/11/2024 Left 03/11/2024  Hip flexion 4 4  Hip extension    Hip abduction 4 4  Hip adduction 4 4  Hip internal rotation    Hip external rotation    Knee flexion 4+ 4+  Knee extension 4+ 4+  Ankle dorsiflexion 4+ 4+  Ankle plantarflexion    Ankle inversion    Ankle eversion    (* = pain; Blank rows = not tested)   Sensation Grossly intact to light touch bilateral LEs as determined by testing dermatomes L2-S2. Proprioception, and hot/cold testing deferred on this date.   Reflexes R/L Knee Jerk (L3/4):deferred  Ankle  Jerk (S1/2): deferred    Cranial Nerves  Deferred    Coordination/Cerebellar Finger to Nose: Slowed with RUE Heel to Shin: slightly impaired R LE Rapid alternating movements: impaired R LE Finger Opposition: unable on R UE  Pronator Drift: Negative   Modified Ashworth Scale (Deferred) Biceps: R , L  Wrist flexors: R , L  Hamstrings: R , L  Gastrocnemius: R , L   0 No increase in tone  1 slight increase in tone giving a catch when slight increase in muscle tone, manifested by the limb was moved in flexion or extension.  1+ slight increase in muscle tone, manifested by a catch followed by minimal resistance throughout (ROM )  2 more marked increase in tone but more marked increased in muscle tone through most limb easily flexed  3 considerable increase in tone, passive movement difficult  4 limb rigid in flexion or extension     FUNCTIONAL OUTCOME MEASURES   Results Comments  BERG /56 Fall risk, in need of intervention  DGI 20/24   FGA /30   TUG seconds   5TSTS 8.80 seconds   6 Minute Walk Test    10 Meter Gait Speed Self-selected: s = m/s; Fastest: s = m/s Below normative values for full community ambulation  (Blank rows = not tested)    TODAY'S TREATMENT 03/11/24 HEP handout  initiated and educated on role of PT    PATIENT EDUCATION:  Education details: HEP, POC, goals  Person educated: Patient Education method: Explanation, Demonstration, and Handouts Education comprehension: verbalized understanding and returned demonstration   HOME EXERCISE PROGRAM: Access Code: [email or web address] URL: [email or web address] Date: 03/11/2024 Prepared by: Janine Melbourne  Exercises - Sit to Stand with Arms Crossed  - 2-3 x daily - 5-7 x weekly - 3 sets - 10 reps - Standing March with Counter Support  - 2-3 x daily - 5-7 x weekly - 3 sets - 10 reps - Heel Raises with Counter Support  - 2-3 x daily - 5-7 x weekly - 3 sets - 10 reps   ASSESSMENT:  CLINICAL IMPRESSION: Patient is a 43 y.o. male who was seen today for physical therapy evaluation and treatment for balance. Objective impairments include Abnormal gait, cardiopulmonary status limiting activity, decreased activity tolerance, decreased balance, decreased coordination, decreased endurance, decreased mobility, decreased strength, decreased safety awareness, and postural dysfunction. These impairments are limiting patient from laundry, community activity, and yard work. 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  are also affecting patient's functional outcome. Patient will benefit from skilled PT to address above impairments and improve overall function.  REHAB POTENTIAL: Fair    CLINICAL DECISION MAKING: Evolving/moderate complexity  EVALUATION COMPLEXITY: Moderate   GOALS: Goals reviewed with patient? Yes  SHORT TERM GOALS: Target date: 04/22/2024  Pt will be independent with HEP in order to improve strength and  balance in order to decrease fall risk and improve function at home. Baseline: 5/21: initiated Goal status: INITIAL   LONG TERM GOALS: Target date: 06/03/2024   1.  Pt will improve BERG by at least 3 points in order to demonstrate clinically significant improvement in balance.   Baseline: 5/21: to be completed visit #2  Goal status: INITIAL  2.  Pt will improve ABC by at least 13% in order to demonstrate clinically significant improvement  in balance confidence.      Baseline: 5/21: 68.8% Goal status: INITIAL  3. Pt will improve DGI by at least 3 points in order to demonstrate clinically significant improvement in balance and decreased risk for falls.     Baseline: 5/21: 20/24  Goal status: INITIAL  4. Pt will increase by at least 80m (165ft) in order to demonstrate clinically significant improvement in cardiopulmonary endurance and community ambulation   Baseline: 5/21: to be completed visit #2  Goal status: INITIAL   PLAN: PT FREQUENCY: 1-2x/week  PT DURATION: 12 weeks  PLANNED INTERVENTIONS: Therapeutic exercises, Therapeutic activity, Neuromuscular re-education, Balance training, Gait training, Patient/Family education, Joint manipulation, Joint mobilization, Canalith repositioning, Aquatic Therapy, Dry Needling, Cognitive remediation, Electrical stimulation, Spinal manipulation, Spinal mobilization, Cryotherapy, Moist heat, Traction, Ultrasound, Ionotophoresis 4mg /ml Dexamethasone , and Manual therapy  PLAN FOR NEXT SESSION: BERG, , balance and BLE strengthening     Janine Melbourne, PT, DPT Physical Therapist - [place]  [facility]  Embree Brawley A Miyako Oelke 03/11/2024, 11:30 AM

## 2024-03-18 ENCOUNTER — Encounter: Payer: MEDICAID | Admitting: Physical Therapy

## 2024-03-18 NOTE — Therapy (Signed)
 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   left cerebrum   Erectile dysfunction    Heart murmur    Hyperlipidemia    Schizophrenia (HCC)    Tobacco abuse    Past Surgical History:  Procedure Laterality Date   BOWEL RESECTION N/A 09/04/2022   Procedure: SMALL BOWEL RESECTION, open;  Surgeon: Alben Alma, MD;  Location: [facility] ORS;  Service: General;  Laterality: N/A;   IR CT HEAD LTD  03/21/2023   IR CT HEAD LTD  03/21/2023   IR PERCUTANEOUS ART THROMBECTOMY/INFUSION INTRACRANIAL INC DIAG ANGIO  03/21/2023   ORIF ORBITAL FRACTURE  1985   PARTIAL COLECTOMY Right 09/04/2022   Procedure: PARTIAL COLECTOMY;  Surgeon: Alben Alma, MD;  Location: [facility] ORS;  Service: General;  Laterality: Right;  Right colectimy   RADIOLOGY WITH ANESTHESIA N/A 03/21/2023   Procedure: RADIOLOGY WITH ANESTHESIA;  Surgeon: Radiologist, Medication, MD;  Location: [facility] OR;  Service: Radiology;  Laterality: N/A;   TEE WITHOUT CARDIOVERSION N/A 07/19/2020   Procedure: TRANSESOPHAGEAL ECHOCARDIOGRAM (TEE);  Surgeon: Michelle Aid, MD;  Location: [facility] ORS;  Service: Cardiovascular;  Laterality: N/A;   WRIST SURGERY Left 11/07/2013   REPAIR ULNAR ARTERY, NERVE AND TENDON   Patient Active Problem List   Diagnosis Date Noted   Speech and language deficits 04/22/2023   Hemiparesis affecting right side as late effect of  cerebrovascular accident (CVA) (HCC) 04/22/2023   Spasticity 04/22/2023   Chronic diarrhea 04/22/2023   Chronic schizophrenia (HCC) 04/01/2023   S/P right colectomy 09/04/2022   Dental decay 06/12/2022   Erectile dysfunction 07/26/2021   Hyperlipidemia LDL goal <70 03/28/2021   History of embolic stroke 07/18/2020   Drug abuse (HCC) 07/17/2020   Major depressive disorder, recurrent episode, severe (HCC) 02/20/2015   Alcohol use disorder, severe, dependence (HCC) 02/20/2015   Stimulant use disorder 02/20/2015   Tobacco use disorder 02/20/2015    PCP: Arleen Lacer. [place], [place]  REFERRING PROVIDER: Arleen Lacer. [place], [place]  REFERRING DIAGNOSIS: [phone] (ICD-10-CM) - Hemiparesis affecting right side as late effect of cerebrovascular accident (CVA) (HCC)   THERAPY DIAG: Left middle cerebral artery stroke (HCC)  Muscle weakness (generalized)  Other lack of coordination  Unsteadiness on feet  ONSET DATE: 03/21/23 (ED to hospital admission, CVA)  FOLLOW UP APPT WITH PROVIDER: Yes ; 03/11/24   RATIONALE FOR EVALUATION AND TREATMENT: Rehabilitation  Pertinent History  43 y.o. 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  Patient reports that he has been losing his balance while he's walking. Tends to lose  balance backwards. Difficulty expressing self due to aphasia.   Pt currently referred for R hemiparesis after stroke. Pt also has referrals for speech and OT.   Pain: No Numbness/Tingling: Yes - R forearm and hand  Focal Weakness: No Recent changes in overall health/medication: No Prior history of physical therapy for balance:  No Falls: Has patient fallen in last 6 months? 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, goats) Red flags (bowel/bladder changes, saddle paresthesia, personal history of cancer, h/o spinal tumors, h/o compression fx, h/o abdominal aneurysm, abdominal pain, chills/fever, night sweats, nausea, vomiting, unrelenting pain): Negative  Precautions: None  Weight Bearing Restrictions: No  Living Environment Lives with: lives with mother  Lives in: Mobile home Has following equipment at home: shower chair   Patient Goals: to reduce falls and to improve balance      OBJECTIVE:   Patient Surveys  ABC: 68.8% (unsure if patient fully understood meaning of questionnaire)  Cognition Patient is oriented to person, place, and time.  Recent memory is intact.  Remote memory is impaired.  Attention span and concentration are intact.  Expressive speech is impaired.      Gross Musculoskeletal Assessment Tremor: Clonus BLE Bulk: Normal Tone: Mild to moderate spasticity for R upper/lower extremity flexors   GAIT: Distance walked: 79'  Assistive device utilized: None Level of assistance: Complete Independence Comments: drifts L/R, decreased foot clearance bilaterally intermittently, rounded shoulders    LE MMT:  MMT (out of 5) Right 03/11/2024 Left 03/11/2024  Hip flexion 4 4  Hip extension    Hip abduction 4 4  Hip adduction 4 4  Hip internal rotation    Hip external rotation    Knee flexion 4+ 4+  Knee extension 4+ 4+  Ankle dorsiflexion 4+ 4+  Ankle plantarflexion    Ankle inversion    Ankle eversion    (* = pain; Blank rows = not tested)   Sensation Grossly intact to light touch bilateral LEs as determined by testing dermatomes L2-S2. Proprioception, and hot/cold testing deferred on this date.   Reflexes R/L Knee Jerk (L3/4):deferred  Ankle Jerk (S1/2): deferred    Cranial Nerves  Deferred     Coordination/Cerebellar Finger to Nose: Slowed with RUE Heel to Shin: slightly impaired R LE Rapid alternating movements: impaired R LE Finger Opposition: unable on R UE, intact LUE Pronator Drift: Positive    Modified Ashworth Scale Biceps: R 3, L 1 Wrist flexors: R 0, L 0 Hamstrings: R 2, L 1 Gastrocnemius: R 3, L 3  0 No increase in tone 1 slight increase in tone giving a catch when slight increase in muscle tone, manifested by the limb was moved in flexion or extension. 1+ slight increase in muscle tone, manifested by a catch followed by minimal resistance throughout (ROM ) 2 more marked increase in tone but more marked increased in muscle tone through most limb easily flexed 3 considerable increase in tone, passive movement difficult 4 limb rigid in flexion or extension     FUNCTIONAL OUTCOME MEASURES   Results Comments  BERG 54/56 Fall risk, in need of intervention  DGI 20/24   5TSTS 8.80 seconds   6 Minute Walk Test 1260 ft   Mini-BESTest Next visit   (Blank rows = not tested)    TODAY'S TREATMENT 03/19/24   Today's Vitals  03/19/24 1120  BP: 120/64  Pulse: 71  SpO2: 99%   There is no height or weight on file to calculate BMI.   SUBJECTIVE STATEMENT:   Patient reports doing fairly well with accessing his home at this time. He reports doing well with initial home exercises given by evaluating therapist. He reports blood pressure being fairly well controlled with medicine. Pt has difficulty with articulation/expressive aphasia.     Neuromuscular Re-education - for improved sensory integration, static and dynamic postural control, equilibrium and non-equilibrium coordination as needed for negotiating home and community environment and stepping over obstacles  Modified Ashworth Testing (see above) Performance of BERG* (see test below)   [facility] PT Assessment - 03/19/24 0001       Standardized Balance Assessment   Standardized Balance Assessment Berg  Balance Test      Berg Balance Test   Sit to Stand Able to stand without using hands and stabilize independently    Standing Unsupported Able to stand safely 2 minutes    Sitting with Back Unsupported but Feet Supported on Floor or Stool Able to sit safely and securely 2 minutes    Stand to Sit Sits safely with minimal use of hands    Transfers Able to transfer safely, minor use of hands    Standing Unsupported with Eyes Closed Able to stand 10 seconds safely    Standing Unsupported with Feet Together Able to place feet together independently and stand 1 minute safely    From Standing, Reach Forward with Outstretched Arm Can reach confidently >25 cm (10")    From Standing Position, Pick up Object from Floor Able to pick up shoe, needs supervision    From Standing Position, Turn to Look Behind Over each Shoulder Looks behind from both sides and weight shifts well    Turn 360 Degrees Able to turn 360 degrees safely one side only in 4 seconds or less    Standing Unsupported, Alternately Place Feet on Step/Stool Able to stand independently and safely and complete 8 steps in 20 seconds    Standing Unsupported, One Foot in Front Able to place foot tandem independently and hold 30 seconds    Standing on One Leg Able to lift leg independently and hold > 10 seconds    Total Score 54             In // bars: Toe tapping; 6-inch step + Airex; 2 x 10 alt R/L Hurdle stepping  (3) 6-inch hurdles; x 2 D/B; 2 D/B   -performed with relative ease  (3) 6-inch hurdles + (2) 12-inch hurdles; x 4 D/B    Therapeutic Exercise - aerobic endurance testing for baseline measurement; improved strength as needed to improve performance of CKC activities/functional movements and as needed for power production to prevent fall during episode of large postural perturbation   6-minute walk test performance  -Reviewed result with patient    PATIENT EDUCATION:  Education details: Discussed results of testing,  goals of PT, cut-off scores for fall risk measures, plan of care. Discussed use of toe tapping drill on porch steps at home to work on standing coordination and adequate clearing of toes with each step.  Person educated: Patient Education method: Explanation, Demonstration, and Handouts Education comprehension: verbalized understanding and returned demonstration   HOME EXERCISE PROGRAM: Access Code: [email or web address] URL: [email or web address] Date: 03/11/2024 Prepared by: Janine Melbourne  Exercises - Sit to Stand with Arms Crossed  - 2-3 x daily - 5-7 x weekly - 3 sets -  10 reps - Standing March with Counter Support  - 2-3 x daily - 5-7 x weekly - 3 sets - 10 reps - Heel Raises with Counter Support  - 2-3 x daily - 5-7 x weekly - 3 sets - 10 reps   ASSESSMENT:  CLINICAL IMPRESSION: Patient fortunately scores in low fall risk category and superior to established cut-off scores for BERG and DGI. He performs relatively well on 6-minute walk test and will likely achieve associated long-term goal. Given patient's current level of function, higher-level balance/fall risk assessment should be completed next visit. We will plan for completion of Mini-BESTest and mCTSIB next visit. Patient has remaining deficits in BLE weakness, RUE/RLE coordination impairment, R upper and lower limb flexor spasticity of mild to moderate magnitude, gait changes, and intermittent postural control impairments with pt tending to loss balance backwards (Mini-BESTest will check for multi-directional reactive balance next visit). Patient will benefit from skilled PT to address above impairments and improve overall function.  REHAB POTENTIAL: Fair    CLINICAL DECISION MAKING: Evolving/moderate complexity  EVALUATION COMPLEXITY: Moderate   GOALS: Goals reviewed with patient? Yes  SHORT TERM GOALS: Target date: 04/22/2024  Pt will be independent with HEP in order to improve strength and balance in order to  decrease fall risk and improve function at home. Baseline: 5/21: initiated Goal status: INITIAL   LONG TERM GOALS: Target date: 06/03/2024   1.  Pt will improve BERG by at least 3 points in order to demonstrate clinically significant improvement in balance.   Baseline: 5/21: to be completed visit #2.   03/19/24: 54/56 Goal status: INITIAL  2.  Pt will improve ABC by at least 13% in order to demonstrate clinically significant improvement in balance confidence.      Baseline: 5/21: 68.8% Goal status: INITIAL  3. Pt will improve DGI by at least 3 points in order to demonstrate clinically significant improvement in balance and decreased risk for falls.     Baseline: 5/21: 20/24  Goal status: INITIAL  4. Pt will increase by at least 2m (157ft) in order to demonstrate clinically significant improvement in cardiopulmonary endurance and community ambulation   Baseline: 5/21: to be completed visit #2.   03/19/24: 1260 ft.  Goal status: INITIAL   PLAN: PT FREQUENCY: 1-2x/week  PT DURATION: 12 weeks  PLANNED INTERVENTIONS: Therapeutic exercises, Therapeutic activity, Neuromuscular re-education, Balance training, Gait training, Patient/Family education, Joint manipulation, Joint mobilization, Canalith repositioning, Aquatic Therapy, Dry Needling, Cognitive remediation, Electrical stimulation, Spinal manipulation, Spinal mobilization, Cryotherapy, Moist heat, Traction, Ultrasound, Ionotophoresis 4mg /ml Dexamethasone , and Manual therapy  PLAN FOR NEXT SESSION: Mini-BESTest, mCTSIB, balance and BLE strengthening     Denese Finn, PT, DPT [phone]  Aleatha Hunting 03/19/2024, 12:31 PM

## 2024-03-19 ENCOUNTER — Ambulatory Visit: Payer: MEDICAID | Admitting: Physical Therapy

## 2024-03-19 VITALS — BP 120/64 | HR 71

## 2024-03-19 DIAGNOSIS — M6281 Muscle weakness (generalized): Secondary | ICD-10-CM

## 2024-03-19 DIAGNOSIS — R262 Difficulty in walking, not elsewhere classified: Secondary | ICD-10-CM | POA: Diagnosis not present

## 2024-03-19 DIAGNOSIS — I63512 Cerebral infarction due to unspecified occlusion or stenosis of left middle cerebral artery: Secondary | ICD-10-CM

## 2024-03-19 DIAGNOSIS — R278 Other lack of coordination: Secondary | ICD-10-CM

## 2024-03-19 DIAGNOSIS — R2681 Unsteadiness on feet: Secondary | ICD-10-CM

## 2024-03-21 ENCOUNTER — Encounter: Payer: Self-pay | Admitting: Physical Therapy

## 2024-03-23 ENCOUNTER — Encounter: Payer: MEDICAID | Admitting: Physical Therapy

## 2024-03-25 ENCOUNTER — Encounter: Payer: MEDICAID | Admitting: Physical Therapy

## 2024-03-25 NOTE — Therapy (Deleted)
 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   left cerebrum   Erectile dysfunction    Heart murmur    Hyperlipidemia    Schizophrenia (HCC)    Tobacco abuse    Past Surgical History:  Procedure Laterality Date   BOWEL RESECTION N/A 09/04/2022   Procedure: SMALL BOWEL RESECTION, open;  Surgeon: Alben Alma, MD;  Location: [facility];  Service: General;  Laterality: N/A;   IR CT HEAD LTD  03/21/2023   IR CT HEAD LTD  03/21/2023   IR PERCUTANEOUS ART THROMBECTOMY/INFUSION INTRACRANIAL INC DIAG ANGIO  03/21/2023   ORIF ORBITAL FRACTURE  1985   PARTIAL COLECTOMY Right 09/04/2022   Procedure: PARTIAL COLECTOMY;  Surgeon: Alben Alma, MD;  Location: [facility];  Service: General;  Laterality: Right;  Right colectimy   RADIOLOGY WITH ANESTHESIA N/A 03/21/2023   Procedure: RADIOLOGY WITH ANESTHESIA;  Surgeon: Radiologist, Medication, MD;  Location: [facility];  Service: Radiology;  Laterality: N/A;   TEE WITHOUT CARDIOVERSION N/A 07/19/2020   Procedure: TRANSESOPHAGEAL ECHOCARDIOGRAM (TEE);  Surgeon: Michelle Aid, MD;  Location: [facility];  Service: Cardiovascular;  Laterality: N/A;   WRIST SURGERY Left 11/07/2013   REPAIR ULNAR ARTERY, NERVE AND TENDON   Patient Active Problem List   Diagnosis Date Noted   Speech and language deficits 04/22/2023   Hemiparesis affecting right side as late effect of cerebrovascular accident (CVA) (HCC) 04/22/2023   Spasticity 04/22/2023   Chronic diarrhea 04/22/2023   Chronic schizophrenia (HCC) 04/01/2023   S/P right colectomy 09/04/2022   Dental decay 06/12/2022   Erectile dysfunction 07/26/2021   Hyperlipidemia LDL goal <70 03/28/2021   History of embolic stroke 07/18/2020   Drug abuse  (HCC) 07/17/2020   Major depressive disorder, recurrent episode, severe (HCC) 02/20/2015   Alcohol use disorder, severe, dependence (HCC) 02/20/2015   Stimulant use disorder 02/20/2015   Tobacco use disorder 02/20/2015    PCP: Arleen Lacer. Larkin [place], [place]  REFERRING PROVIDER: Arleen Lacer. [place], [place]  REFERRING DIAGNOSIS: [phone] (ICD-10-CM) - Hemiparesis affecting right side as late effect of cerebrovascular accident (CVA) (HCC)   THERAPY DIAG: Left middle cerebral artery stroke (HCC)  Muscle weakness (generalized)  Other lack of coordination  Unsteadiness on feet  ONSET DATE: 03/21/23 (ED to hospital admission, CVA)  FOLLOW UP APPT WITH PROVIDER: Yes ; 03/11/24   RATIONALE FOR EVALUATION AND TREATMENT: Rehabilitation  Pertinent History  43 y.o. 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  Patient reports that he has been losing his balance while he's walking. Tends to lose balance backwards. Difficulty expressing self due to aphasia.   Pt currently referred for R hemiparesis after stroke. Pt also has referrals for speech and OT.   Pain: No Numbness/Tingling: Yes - R forearm and hand  Focal Weakness: No Recent changes in overall health/medication: No Prior history of physical therapy for balance:  No Falls: Has patient fallen in last 6 months? 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, goats) Red flags (bowel/bladder changes, saddle paresthesia, personal history of cancer, h/o spinal tumors, h/o compression fx, h/o abdominal aneurysm, abdominal pain, chills/fever, night sweats, nausea,  vomiting, unrelenting pain): Negative  Precautions: None  Weight Bearing Restrictions: No  Living Environment Lives with: lives with mother  Lives in: Mobile home Has following equipment at home: shower chair   Patient Goals: to reduce falls and to improve balance      OBJECTIVE:   Patient Surveys  ABC: 68.8% (unsure if patient fully understood meaning of questionnaire)  Cognition Patient is oriented to person, place, and time.  Recent memory is intact.  Remote memory is impaired.  Attention span and concentration are intact.  Expressive speech is impaired.      Gross Musculoskeletal Assessment Tremor: Clonus BLE Bulk: Normal Tone: Mild to moderate spasticity for R upper/lower extremity flexors   GAIT: Distance walked: 75'  Assistive device utilized: None Level of assistance: Complete Independence Comments: drifts L/R, decreased foot clearance bilaterally intermittently, rounded shoulders    LE MMT:  MMT (out of 5) Right 03/11/2024 Left 03/11/2024  Hip flexion 4 4  Hip extension    Hip abduction 4 4  Hip adduction 4 4  Hip internal rotation    Hip external rotation    Knee flexion 4+ 4+  Knee extension 4+ 4+  Ankle dorsiflexion 4+ 4+  Ankle plantarflexion    Ankle inversion    Ankle eversion    (* = pain; Blank rows = not tested)   Sensation Grossly intact to light touch bilateral LEs as determined by testing dermatomes L2-S2. Proprioception, and hot/cold testing deferred on this date.   Reflexes R/L Knee Jerk (L3/4):deferred  Ankle Jerk (S1/2): deferred    Cranial Nerves  Deferred    Coordination/Cerebellar Finger to Nose: Slowed with RUE Heel to Shin: slightly impaired R LE Rapid alternating movements: impaired R LE Finger Opposition: unable on R UE, intact LUE Pronator Drift: Positive    Modified Ashworth Scale Biceps: R 3, L 1 Wrist flexors: R 0, L 0 Hamstrings: R 2, L 1 Gastrocnemius: R 3, L 3  0 No increase in tone 1  slight increase in tone giving a catch when slight increase in muscle tone, manifested by the limb was moved in flexion or extension. 1+ slight increase in muscle tone, manifested by a catch followed by minimal resistance throughout (ROM ) 2 more marked increase in tone but more marked increased in muscle tone through most limb easily flexed 3 considerable increase in tone, passive movement difficult 4 limb rigid in flexion or extension     FUNCTIONAL OUTCOME MEASURES   Results Comments  BERG 54/56 Fall risk, in need of intervention  DGI 20/24   5TSTS 8.80 seconds   6 Minute Walk Test 1260 ft   Mini-BESTest Next visit   (Blank rows = not tested)   Clinical Test of Sensory Interaction for Balance    (CTSIB):  CONDITION TIME STRATEGY SWAY  Eyes open, firm surface 30 seconds ankle   Eyes closed, firm surface 30 seconds ankle   Eyes open, foam surface 30 seconds ankle   Eyes closed, foam surface 30 seconds ankle       TODAY'S TREATMENT 03/25/24   SUBJECTIVE STATEMENT:   Patient reports doing fairly well with accessing his home at this time. He reports doing well with initial home exercises given by evaluating therapist. He  reports blood pressure being fairly well controlled with medicine. Pt has difficulty with articulation/expressive aphasia.     Neuromuscular Re-education - for improved sensory integration, static and dynamic postural control, equilibrium and non-equilibrium coordination as needed for negotiating home and community environment and stepping over obstacles  Performance of Mini-BESTest Completion of mCTSIB (see above)   In // bars: Toe tapping; 6-inch step + Airex; 2 x 10 alt R/L Hurdle stepping  (3) 6-inch hurdles; x 2 D/B; 2 D/B   -performed with relative ease  (3) 6-inch hurdles + (2) 12-inch hurdles; x 4 D/B    Therapeutic Exercise - aerobic endurance testing for baseline measurement; improved strength as needed to improve performance of CKC  activities/functional movements and as needed for power production to prevent fall during episode of large postural perturbation      PATIENT EDUCATION:  Education details: Discussed results of testing, goals of PT, cut-off scores for fall risk measures, plan of care. Discussed use of toe tapping drill on porch steps at home to work on standing coordination and adequate clearing of toes with each step.  Person educated: Patient Education method: Explanation, Demonstration, and Handouts Education comprehension: verbalized understanding and returned demonstration   HOME EXERCISE PROGRAM: Access Code: [email or web address] URL: [email or web address] Date: 03/11/2024 Prepared by: Janine Melbourne  Exercises - Sit to Stand with Arms Crossed  - 2-3 x daily - 5-7 x weekly - 3 sets - 10 reps - Standing March with Counter Support  - 2-3 x daily - 5-7 x weekly - 3 sets - 10 reps - Heel Raises with Counter Support  - 2-3 x daily - 5-7 x weekly - 3 sets - 10 reps   ASSESSMENT:  CLINICAL IMPRESSION: Patient fortunately scores in low fall risk category and superior to established cut-off scores for BERG and DGI. He performs relatively well on 6-minute walk test and will likely achieve associated long-term goal. Given patient's current level of function, higher-level balance/fall risk assessment should be completed next visit. We will plan for completion of Mini-BESTest and mCTSIB next visit. Patient has remaining deficits in BLE weakness, RUE/RLE coordination impairment, R upper and lower limb flexor spasticity of mild to moderate magnitude, gait changes, and intermittent postural control impairments with pt tending to loss balance backwards (Mini-BESTest will check for multi-directional reactive balance next visit). Patient will benefit from skilled PT to address above impairments and improve overall function.  REHAB POTENTIAL: Fair    CLINICAL DECISION MAKING: Evolving/moderate  complexity  EVALUATION COMPLEXITY: Moderate   GOALS: Goals reviewed with patient? Yes  SHORT TERM GOALS: Target date: 04/22/2024  Pt will be independent with HEP in order to improve strength and balance in order to decrease fall risk and improve function at home. Baseline: 5/21: initiated Goal status: INITIAL   LONG TERM GOALS: Target date: 06/03/2024   1.  Pt will improve BERG by at least 3 points in order to demonstrate clinically significant improvement in balance.   Baseline: 5/21: to be completed visit #2.   03/19/24: 54/56 Goal status: INITIAL  2.  Pt will improve ABC by at least 13% in order to demonstrate clinically significant improvement in balance confidence.      Baseline: 5/21: 68.8% Goal status: INITIAL  3. Pt will improve DGI by at least 3 points in order to demonstrate clinically significant improvement in balance and decreased risk for falls.     Baseline: 5/21: 20/24  Goal status: INITIAL  4. Pt will increase by at least 54m (178ft)  in order to demonstrate clinically significant improvement in cardiopulmonary endurance and community ambulation   Baseline: 5/21: to be completed visit #2.   03/19/24: 1260 ft.  Goal status: INITIAL   PLAN: PT FREQUENCY: 1-2x/week  PT DURATION: 12 weeks  PLANNED INTERVENTIONS: Therapeutic exercises, Therapeutic activity, Neuromuscular re-education, Balance training, Gait training, Patient/Family education, Joint manipulation, Joint mobilization, Canalith repositioning, Aquatic Therapy, Dry Needling, Cognitive remediation, Electrical stimulation, Spinal manipulation, Spinal mobilization, Cryotherapy, Moist heat, Traction, Ultrasound, Ionotophoresis 4mg /ml Dexamethasone , and Manual therapy  PLAN FOR NEXT SESSION: Mini-BESTest, mCTSIB, balance and BLE strengthening     Denese Finn, PT, DPT [phone]  Aleatha Hunting 03/25/2024, 8:25 PM

## 2024-03-26 ENCOUNTER — Other Ambulatory Visit: Payer: Self-pay | Admitting: Physician Assistant

## 2024-03-26 ENCOUNTER — Ambulatory Visit: Payer: MEDICAID | Admitting: Physical Therapy

## 2024-03-26 DIAGNOSIS — I63512 Cerebral infarction due to unspecified occlusion or stenosis of left middle cerebral artery: Secondary | ICD-10-CM

## 2024-03-26 DIAGNOSIS — M6281 Muscle weakness (generalized): Secondary | ICD-10-CM

## 2024-03-26 DIAGNOSIS — R2681 Unsteadiness on feet: Secondary | ICD-10-CM

## 2024-03-26 DIAGNOSIS — R278 Other lack of coordination: Secondary | ICD-10-CM

## 2024-03-26 NOTE — Telephone Encounter (Unsigned)
 Copied from CRM [id number]. Topic: Clinical - Medication Refill >> Mar 26, 2024 12:05 PM Elle L wrote: Medication: benztropine  (COGENTIN ) 1 MG tablet AND divalproex  (DEPAKOTE  ER) 250 MG 24 hr tablet AND ARIPiprazole  (ABILIFY ) 30 MG tablet AND mirtazapine  (REMERON ) 45 MG tablet AND atorvastatin  (LIPITOR) 40 MG tablet AND escitalopram  (LEXAPRO ) 20 MG tablet   Has the patient contacted their pharmacy? Yes  This is the patient's preferred pharmacy:  [id number] - [place], [place] - [address] [place] [address] Phone: [phone] Fax: [phone]  Is this the correct pharmacy for this prescription? Yes  Has the prescription been filled recently? Yes  Is the patient out of the medication? No  Has the patient been seen for an appointment in the last year OR does the patient have an upcoming appointment? No, declined appointment.   Can we respond through MyChart? Yes  Agent: Please be advised that Rx refills may take up to 3 business days. We ask that you follow-up with your pharmacy.

## 2024-03-26 NOTE — Telephone Encounter (Signed)
 Patient mother called back - also needs to add aspirin  81 MG chewable tablet

## 2024-03-27 ENCOUNTER — Other Ambulatory Visit: Payer: Self-pay

## 2024-03-27 MED ORDER — ARIPIPRAZOLE 30 MG PO TABS
30.0000 mg | ORAL_TABLET | Freq: Every day | ORAL | 0 refills | Status: AC
Start: 2024-03-27 — End: ?

## 2024-03-27 MED ORDER — MIRTAZAPINE 45 MG PO TABS
45.0000 mg | ORAL_TABLET | Freq: Every day | ORAL | 0 refills | Status: AC
Start: 2024-03-27 — End: ?

## 2024-03-27 MED ORDER — DIVALPROEX SODIUM ER 250 MG PO TB24: 750.0000 mg | ORAL_TABLET | Freq: Every day | ORAL | 0 refills | Status: DC

## 2024-03-27 MED ORDER — BENZTROPINE MESYLATE 1 MG PO TABS: 1.0000 mg | ORAL_TABLET | Freq: Every day | ORAL | 0 refills | Status: AC

## 2024-03-27 MED ORDER — ESCITALOPRAM OXALATE 20 MG PO TABS: 20.0000 mg | ORAL_TABLET | Freq: Every day | ORAL | 0 refills | Status: AC

## 2024-03-27 NOTE — Telephone Encounter (Signed)
 Requested Prescriptions  Pending Prescriptions Disp Refills   benztropine  (COGENTIN ) 1 MG tablet 90 tablet 0    Sig: Take 1 tablet (1 mg total) by mouth at bedtime.     Neurology: Parkinsonian Agents - benztropine  mesylate Passed - 03/27/2024 12:34 PM      Passed - Last BP in normal range    BP Readings from Last 1 Encounters:  03/19/24 120/64         Passed - Last Heart Rate in normal range    Pulse Readings from Last 1 Encounters:  03/19/24 71         Passed - Valid encounter within last 12 months    Recent Outpatient Visits           3 months ago Hemiparesis affecting right side as late effect of cerebrovascular accident (CVA) (HCC)   [place] [facility] at [facility], Arleen Lacer, [place]               divalproex  (DEPAKOTE  ER) 250 MG 24 hr tablet 270 tablet 0    Sig: Take 3 tablets (750 mg total) by mouth at bedtime.     Neurology:  Anticonvulsants - Valproates Failed - 03/27/2024 12:34 PM      Failed - Valproic Acid  (serum) in normal range and within 360 days    Valproic Acid  Lvl  Date Value Ref Range Status  07/18/2022 18 (L) 50 - 100 ug/mL Final    Comment:                                    Detection Limit = 4                            <4 indicates None Detected Toxicity may occur at levels of 100-500. Measurements of free unbound valproic acid  may improve the assess- ment of clinical response.          Passed - AST in normal range and within 360 days    AST  Date Value Ref Range Status  08/15/2023 16 0 - 40 IU/L Final   SGOT(AST)  Date Value Ref Range Status  02/14/2015 25 U/L Final    Comment:    15-41 NOTE: New Reference Range  12/28/14          Passed - ALT in normal range and within 360 days    ALT  Date Value Ref Range Status  08/15/2023 14 0 - 44 IU/L Final   SGPT (ALT)  Date Value Ref Range Status  02/14/2015 32 U/L Final    Comment:    17-63 NOTE: New Reference Range  12/28/14          Passed -  HGB in normal range and within 360 days    Hemoglobin  Date Value Ref Range Status  08/15/2023 13.8 13.0 - 17.7 g/dL Final         Passed - PLT in normal range and within 360 days    Platelets  Date Value Ref Range Status  08/15/2023 228 150 - 450 x10E3/uL Final         Passed - WBC in normal range and within 360 days    WBC  Date Value Ref Range Status  08/15/2023 9.3 3.4 - 10.8 x10E3/uL Final  03/28/2023 12.6 (H) 4.0 - 10.5 K/uL Final  Passed - HCT in normal range and within 360 days    Hematocrit  Date Value Ref Range Status  08/15/2023 42.8 37.5 - 51.0 % Final         Passed - Completed PHQ-2 or PHQ-9 in the last 360 days      Passed - Patient is not pregnant      Passed - Valid encounter within last 12 months    Recent Outpatient Visits           3 months ago Hemiparesis affecting right side as late effect of cerebrovascular accident (CVA) (HCC)   [place] [facility] at [facility], [place], [place]               mirtazapine  (REMERON ) 45 MG tablet 90 tablet 0    Sig: Take 1 tablet (45 mg total) by mouth at bedtime.     Psychiatry: Antidepressants - mirtazapine  Passed - 03/27/2024 12:34 PM      Passed - Completed PHQ-2 or PHQ-9 in the last 360 days      Passed - Valid encounter within last 6 months    Recent Outpatient Visits           3 months ago Hemiparesis affecting right side as late effect of cerebrovascular accident (CVA) (HCC)   [place] [facility] at [facility], Daniel P, [place]               ARIPiprazole  (ABILIFY ) 30 MG tablet 90 tablet 0    Sig: Take 1 tablet (30 mg total) by mouth at bedtime.     Not Delegated - Psychiatry:  Antipsychotics - Second Generation (Atypical) - aripiprazole  Failed - 03/27/2024 12:34 PM      Failed - This refill cannot be delegated      Failed - TSH in normal range and within 360 days    TSH  Date Value Ref Range Status  03/28/2021  4.040 0.450 - 4.500 uIU/mL Final         Failed - Lipid Panel in normal range within the last 12 months    Cholesterol, Total  Date Value Ref Range Status  08/15/2023 111 100 - 199 mg/dL Final   LDL Chol Calc (NIH)  Date Value Ref Range Status  08/15/2023 43 0 - 99 mg/dL Final   HDL  Date Value Ref Range Status  08/15/2023 52 >39 mg/dL Final   Triglycerides  Date Value Ref Range Status  08/15/2023 76 0 - 149 mg/dL Final         Passed - Completed PHQ-2 or PHQ-9 in the last 360 days      Passed - Last BP in normal range    BP Readings from Last 1 Encounters:  03/19/24 120/64         Passed - Last Heart Rate in normal range    Pulse Readings from Last 1 Encounters:  03/19/24 71         Passed - Valid encounter within last 6 months    Recent Outpatient Visits           3 months ago Hemiparesis affecting right side as late effect of cerebrovascular accident (CVA) (HCC)   [place] [facility] at [facility], [place], [place]              Passed - CBC within normal limits and completed in the last 12 months  WBC  Date Value Ref Range Status  08/15/2023 9.3 3.4 - 10.8 x10E3/uL Final  03/28/2023 12.6 (H) 4.0 - 10.5 K/uL Final   RBC  Date Value Ref Range Status  08/15/2023 5.00 4.14 - 5.80 x10E6/uL Final  03/28/2023 5.01 4.22 - 5.81 MIL/uL Final   Hemoglobin  Date Value Ref Range Status  08/15/2023 13.8 13.0 - 17.7 g/dL Final   Hematocrit  Date Value Ref Range Status  08/15/2023 42.8 37.5 - 51.0 % Final   MCHC  Date Value Ref Range Status  08/15/2023 32.2 31.5 - 35.7 g/dL Final  16/07/9603 54.0 30.0 - 36.0 g/dL Final   [facility]  Date Value Ref Range Status  08/15/2023 27.6 26.6 - 33.0 pg Final  03/28/2023 27.3 26.0 - 34.0 pg Final   MCV  Date Value Ref Range Status  08/15/2023 86 79 - 97 fL Final  02/14/2015 83 80 - 100 fL Final   No results found for: "PLTCOUNTKUC", "LABPLAT", "POCPLA" RDW  Date Value Ref Range  Status  08/15/2023 14.1 11.6 - 15.4 % Final  02/14/2015 14.5 11.5 - 14.5 % Final         Passed - CMP within normal limits and completed in the last 12 months    Albumin  Date Value Ref Range Status  08/15/2023 4.3 4.1 - 5.1 g/dL Final  98/08/9146 4.7 g/dL Final    Comment:    8.2-9.5 NOTE: New reference range  12/28/14    Alkaline Phosphatase  Date Value Ref Range Status  08/15/2023 88 44 - 121 IU/L Final  02/14/2015 62 U/L Final    Comment:    38-126 NOTE: New Reference Range  12/28/14    ALT  Date Value Ref Range Status  08/15/2023 14 0 - 44 IU/L Final   SGPT (ALT)  Date Value Ref Range Status  02/14/2015 32 U/L Final    Comment:    17-63 NOTE: New Reference Range  12/28/14    AST  Date Value Ref Range Status  08/15/2023 16 0 - 40 IU/L Final   SGOT(AST)  Date Value Ref Range Status  02/14/2015 25 U/L Final    Comment:    15-41 NOTE: New Reference Range  12/28/14    BUN  Date Value Ref Range Status  08/15/2023 11 6 - 24 mg/dL Final  62/13/0865 12 mg/dL Final    Comment:    7-84 NOTE: New Reference Range  12/28/14    Calcium   Date Value Ref Range Status  08/15/2023 9.1 8.7 - 10.2 mg/dL Final   Calcium , Total  Date Value Ref Range Status  02/14/2015 9.3 mg/dL Final    Comment:    6.9-62.9 NOTE: New Reference Range  12/28/14    Calcium , Ion  Date Value Ref Range Status  03/21/2023 1.13 (L) 1.15 - 1.40 mmol/L Final   CO2  Date Value Ref Range Status  08/15/2023 24 20 - 29 mmol/L Final   Co2  Date Value Ref Range Status  02/14/2015 21 (L) mmol/L Final    Comment:    22-32 NOTE: New Reference Range  12/28/14    Bicarbonate  Date Value Ref Range Status  03/21/2023 17.6 (L) 20.0 - 28.0 mmol/L Final   TCO2  Date Value Ref Range Status  03/21/2023 19 (L) 22 - 32 mmol/L Final   Creatinine  Date Value Ref Range Status  02/14/2015 0.88 mg/dL Final    Comment:    5.28-4.13 NOTE: New Reference Range  12/28/14     Creatinine,  Ser  Date Value Ref Range Status  08/15/2023 0.96 0.76 - 1.27 mg/dL Final   Glucose  Date Value Ref Range Status  08/15/2023 86 70 - 99 mg/dL Final  16/07/9603 85 mg/dL Final    Comment:    54-09 NOTE: New Reference Range  12/28/14    Glucose, Bld  Date Value Ref Range Status  03/28/2023 96 70 - 99 mg/dL Final    Comment:    Glucose reference range applies only to samples taken after fasting for at least 8 hours.   Glucose-Capillary  Date Value Ref Range Status  03/28/2023 115 (H) 70 - 99 mg/dL Final    Comment:    Glucose reference range applies only to samples taken after fasting for at least 8 hours.   Potassium  Date Value Ref Range Status  08/15/2023 4.4 3.5 - 5.2 mmol/L Final  02/14/2015 3.7 mmol/L Final    Comment:    3.5-5.1 NOTE: New Reference Range  12/28/14    Sodium  Date Value Ref Range Status  08/15/2023 143 134 - 144 mmol/L Final  02/14/2015 137 mmol/L Final    Comment:    135-145 NOTE: New Reference Range  12/28/14    Bilirubin,Total  Date Value Ref Range Status  02/14/2015 0.3 mg/dL Final    Comment:    8.1-1.9 NOTE: New Reference Range  12/28/14    Bilirubin Total  Date Value Ref Range Status  08/15/2023 <0.2 0.0 - 1.2 mg/dL Final   Protein, ur  Date Value Ref Range Status  03/23/2023 NEGATIVE NEGATIVE mg/dL Final   Total Protein  Date Value Ref Range Status  08/15/2023 7.2 6.0 - 8.5 g/dL Final  14/78/2956 8.3 (H) g/dL Final    Comment:    2.1-3.0 NOTE: New Reference Range  12/28/14    EGFR (African American)  Date Value Ref Range Status  02/14/2015 >60  Final   GFR calc Af Amer  Date Value Ref Range Status  07/17/2020 >60 >60 mL/min Final   eGFR  Date Value Ref Range Status  08/15/2023 100 >59 mL/min/1.73 Final   EGFR (Non-African Amer.)  Date Value Ref Range Status  02/14/2015 >60  Final    Comment:    eGFR values <3mL/min/1.73 m2 may be an indication of chronic kidney disease  (CKD). Calculated eGFR is useful in patients with stable renal function. The eGFR calculation will not be reliable in acutely ill patients when serum creatinine is changing rapidly. It is not useful in patients on dialysis. The eGFR calculation may not be applicable to patients at the low and high extremes of body sizes, pregnant women, and vegetarians.    GFR, Estimated  Date Value Ref Range Status  03/28/2023 >60 >60 mL/min Final    Comment:    (NOTE) Calculated using the CKD-EPI Creatinine Equation (2021)           escitalopram  (LEXAPRO ) 20 MG tablet 90 tablet 0    Sig: Take 1 tablet (20 mg total) by mouth daily.     Psychiatry:  Antidepressants - SSRI Passed - 03/27/2024 12:34 PM      Passed - Completed PHQ-2 or PHQ-9 in the last 360 days      Passed - Valid encounter within last 6 months    Recent Outpatient Visits           3 months ago Hemiparesis affecting right side as late effect of cerebrovascular accident (CVA) (HCC)   [place] [facility] at [facility]  Le[place]o Rancher, [place]              Refused Prescriptions Disp Refills   atorvastatin  (LIPITOR) 40 MG tablet 90 tablet 2    Sig: Take 1 tablet (40 mg total) by mouth daily.     Cardiovascular:  Antilipid - Statins Failed - 03/27/2024 12:34 PM      Failed - Lipid Panel in normal range within the last 12 months    Cholesterol, Total  Date Value Ref Range Status  08/15/2023 111 100 - 199 mg/dL Final   LDL Chol Calc (NIH)  Date Value Ref Range Status  08/15/2023 43 0 - 99 mg/dL Final   HDL  Date Value Ref Range Status  08/15/2023 52 >39 mg/dL Final   Triglycerides  Date Value Ref Range Status  08/15/2023 76 0 - 149 mg/dL Final         Passed - Patient is not pregnant      Passed - Valid encounter within last 12 months    Recent Outpatient Visits           3 months ago Hemiparesis affecting right side as late effect of cerebrovascular accident (CVA) [facility])   [facility] at [facility], Arleen Lacer, [place]

## 2024-03-27 NOTE — Telephone Encounter (Signed)
 Requested medication (s) are due for refill today: yes  Requested medication (s) are on the active medication list: yes  Last refill:  12/27/23  Future visit scheduled: yes  Notes to clinic:  Unable to refill per protocol, cannot delegate.       Requested Prescriptions  Pending Prescriptions Disp Refills   ARIPiprazole  (ABILIFY ) 30 MG tablet 90 tablet 0    Sig: Take 1 tablet (30 mg total) by mouth at bedtime.     Not Delegated - Psychiatry:  Antipsychotics - Second Generation (Atypical) - aripiprazole  Failed - 03/27/2024 12:35 PM      Failed - This refill cannot be delegated      Failed - TSH in normal range and within 360 days    TSH  Date Value Ref Range Status  03/28/2021 4.040 0.450 - 4.500 uIU/mL Final         Failed - Lipid Panel in normal range within the last 12 months    Cholesterol, Total  Date Value Ref Range Status  08/15/2023 111 100 - 199 mg/dL Final   LDL Chol Calc (NIH)  Date Value Ref Range Status  08/15/2023 43 0 - 99 mg/dL Final   HDL  Date Value Ref Range Status  08/15/2023 52 >39 mg/dL Final   Triglycerides  Date Value Ref Range Status  08/15/2023 76 0 - 149 mg/dL Final         Passed - Completed PHQ-2 or PHQ-9 in the last 360 days      Passed - Last BP in normal range    BP Readings from Last 1 Encounters:  03/19/24 120/64         Passed - Last Heart Rate in normal range    Pulse Readings from Last 1 Encounters:  03/19/24 71         Passed - Valid encounter within last 6 months    Recent Outpatient Visits           3 months ago Hemiparesis affecting right side as late effect of cerebrovascular accident (CVA) (HCC)   [place] [facility] at [facility], [place]leen [place], [place]              Passed - CBC within normal limits and completed in the last 12 months    WBC  Date Value Ref Range Status  08/15/2023 9.3 3.4 - 10.8 x10E3/uL Final  03/28/2023 12.6 (H) 4.0 - 10.5 K/uL Final   RBC  Date Value Ref  Range Status  08/15/2023 5.00 4.14 - 5.80 x10E6/uL Final  03/28/2023 5.01 4.22 - 5.81 MIL/uL Final   Hemoglobin  Date Value Ref Range Status  08/15/2023 13.8 13.0 - 17.7 g/dL Final   Hematocrit  Date Value Ref Range Status  08/15/2023 42.8 37.5 - 51.0 % Final   MCHC  Date Value Ref Range Status  08/15/2023 32.2 31.5 - 35.7 g/dL Final  16/07/9603 54.0 30.0 - 36.0 g/dL Final   [facility]  Date Value Ref Range Status  08/15/2023 27.6 26.6 - 33.0 pg Final  03/28/2023 27.3 26.0 - 34.0 pg Final   MCV  Date Value Ref Range Status  08/15/2023 86 79 - 97 fL Final  02/14/2015 83 80 - 100 fL Final   No results found for: "PLTCOUNTKUC", "LABPLAT", "POCPLA" RDW  Date Value Ref Range Status  08/15/2023 14.1 11.6 - 15.4 % Final  02/14/2015 14.5 11.5 - 14.5 % Final         Passed -  CMP within normal limits and completed in the last 12 months    Albumin  Date Value Ref Range Status  08/15/2023 4.3 4.1 - 5.1 g/dL Final  40/98/1191 4.7 g/dL Final    Comment:    4.7-8.2 NOTE: New reference range  12/28/14    Alkaline Phosphatase  Date Value Ref Range Status  08/15/2023 88 44 - 121 IU/L Final  02/14/2015 62 U/L Final    Comment:    38-126 NOTE: New Reference Range  12/28/14    ALT  Date Value Ref Range Status  08/15/2023 14 0 - 44 IU/L Final   SGPT (ALT)  Date Value Ref Range Status  02/14/2015 32 U/L Final    Comment:    17-63 NOTE: New Reference Range  12/28/14    AST  Date Value Ref Range Status  08/15/2023 16 0 - 40 IU/L Final   SGOT(AST)  Date Value Ref Range Status  02/14/2015 25 U/L Final    Comment:    15-41 NOTE: New Reference Range  12/28/14    BUN  Date Value Ref Range Status  08/15/2023 11 6 - 24 mg/dL Final  95/62/1308 12 mg/dL Final    Comment:    6-57 NOTE: New Reference Range  12/28/14    Calcium   Date Value Ref Range Status  08/15/2023 9.1 8.7 - 10.2 mg/dL Final   Calcium , Total  Date Value Ref Range Status  02/14/2015 9.3 mg/dL  Final    Comment:    8.4-69.6 NOTE: New Reference Range  12/28/14    Calcium , Ion  Date Value Ref Range Status  03/21/2023 1.13 (L) 1.15 - 1.40 mmol/L Final   CO2  Date Value Ref Range Status  08/15/2023 24 20 - 29 mmol/L Final   Co2  Date Value Ref Range Status  02/14/2015 21 (L) mmol/L Final    Comment:    22-32 NOTE: New Reference Range  12/28/14    Bicarbonate  Date Value Ref Range Status  03/21/2023 17.6 (L) 20.0 - 28.0 mmol/L Final   TCO2  Date Value Ref Range Status  03/21/2023 19 (L) 22 - 32 mmol/L Final   Creatinine  Date Value Ref Range Status  02/14/2015 0.88 mg/dL Final    Comment:    2.95-2.84 NOTE: New Reference Range  12/28/14    Creatinine, Ser  Date Value Ref Range Status  08/15/2023 0.96 0.76 - 1.27 mg/dL Final   Glucose  Date Value Ref Range Status  08/15/2023 86 70 - 99 mg/dL Final  13/24/4010 85 mg/dL Final    Comment:    27-25 NOTE: New Reference Range  12/28/14    Glucose, Bld  Date Value Ref Range Status  03/28/2023 96 70 - 99 mg/dL Final    Comment:    Glucose reference range applies only to samples taken after fasting for at least 8 hours.   Glucose-Capillary  Date Value Ref Range Status  03/28/2023 115 (H) 70 - 99 mg/dL Final    Comment:    Glucose reference range applies only to samples taken after fasting for at least 8 hours.   Potassium  Date Value Ref Range Status  08/15/2023 4.4 3.5 - 5.2 mmol/L Final  02/14/2015 3.7 mmol/L Final    Comment:    3.5-5.1 NOTE: New Reference Range  12/28/14    Sodium  Date Value Ref Range Status  08/15/2023 143 134 - 144 mmol/L Final  02/14/2015 137 mmol/L Final    Comment:    135-145 NOTE: New  Reference Range  12/28/14    Bilirubin,Total  Date Value Ref Range Status  02/14/2015 0.3 mg/dL Final    Comment:    4.0-9.8 NOTE: New Reference Range  12/28/14    Bilirubin Total  Date Value Ref Range Status  08/15/2023 <0.2 0.0 - 1.2 mg/dL Final   Protein, ur   Date Value Ref Range Status  03/23/2023 NEGATIVE NEGATIVE mg/dL Final   Total Protein  Date Value Ref Range Status  08/15/2023 7.2 6.0 - 8.5 g/dL Final  11/91/4782 8.3 (H) g/dL Final    Comment:    9.5-6.2 NOTE: New Reference Range  12/28/14    EGFR (African American)  Date Value Ref Range Status  02/14/2015 >60  Final   GFR calc Af Amer  Date Value Ref Range Status  07/17/2020 >60 >60 mL/min Final   eGFR  Date Value Ref Range Status  08/15/2023 100 >59 mL/min/1.73 Final   EGFR (Non-African Amer.)  Date Value Ref Range Status  02/14/2015 >60  Final    Comment:    eGFR values <46mL/min/1.73 m2 may be an indication of chronic kidney disease (CKD). Calculated eGFR is useful in patients with stable renal function. The eGFR calculation will not be reliable in acutely ill patients when serum creatinine is changing rapidly. It is not useful in patients on dialysis. The eGFR calculation may not be applicable to patients at the low and high extremes of body sizes, pregnant women, and vegetarians.    GFR, Estimated  Date Value Ref Range Status  03/28/2023 >60 >60 mL/min Final    Comment:    (NOTE) Calculated using the CKD-EPI Creatinine Equation (2021)          Signed Prescriptions Disp Refills   benztropine  (COGENTIN ) 1 MG tablet 90 tablet 0    Sig: Take 1 tablet (1 mg total) by mouth at bedtime.     Neurology: Parkinsonian Agents - benztropine  mesylate Passed - 03/27/2024 12:35 PM      Passed - Last BP in normal range    BP Readings from Last 1 Encounters:  03/19/24 120/64         Passed - Last Heart Rate in normal range    Pulse Readings from Last 1 Encounters:  03/19/24 71         Passed - Valid encounter within last 12 months    Recent Outpatient Visits           3 months ago Hemiparesis affecting right side as late effect of cerebrovascular accident (CVA) (HCC)   [place] [facility] at [facility], [place], [place]                divalproex  (DEPAKOTE  ER) 250 MG 24 hr tablet 270 tablet 0    Sig: Take 3 tablets (750 mg total) by mouth at bedtime.     Neurology:  Anticonvulsants - Valproates Failed - 03/27/2024 12:35 PM      Failed - Valproic Acid  (serum) in normal range and within 360 days    Valproic Acid  Lvl  Date Value Ref Range Status  07/18/2022 18 (L) 50 - 100 ug/mL Final    Comment:                                    Detection Limit = 4                            <  4 indicates None Detected Toxicity may occur at levels of 100-500. Measurements of free unbound valproic acid  may improve the assess- ment of clinical response.          Passed - AST in normal range and within 360 days    AST  Date Value Ref Range Status  08/15/2023 16 0 - 40 IU/L Final   SGOT(AST)  Date Value Ref Range Status  02/14/2015 25 U/L Final    Comment:    15-41 NOTE: New Reference Range  12/28/14          Passed - ALT in normal range and within 360 days    ALT  Date Value Ref Range Status  08/15/2023 14 0 - 44 IU/L Final   SGPT (ALT)  Date Value Ref Range Status  02/14/2015 32 U/L Final    Comment:    17-63 NOTE: New Reference Range  12/28/14          Passed - HGB in normal range and within 360 days    Hemoglobin  Date Value Ref Range Status  08/15/2023 13.8 13.0 - 17.7 g/dL Final         Passed - PLT in normal range and within 360 days    Platelets  Date Value Ref Range Status  08/15/2023 228 150 - 450 x10E3/uL Final         Passed - WBC in normal range and within 360 days    WBC  Date Value Ref Range Status  08/15/2023 9.3 3.4 - 10.8 x10E3/uL Final  03/28/2023 12.6 (H) 4.0 - 10.5 K/uL Final         Passed - HCT in normal range and within 360 days    Hematocrit  Date Value Ref Range Status  08/15/2023 42.8 37.5 - 51.0 % Final         Passed - Completed PHQ-2 or PHQ-9 in the last 360 days      Passed - Patient is not pregnant      Passed - Valid encounter within last 12  months    Recent Outpatient Visits           3 months ago Hemiparesis affecting right side as late effect of cerebrovascular accident (CVA) (HCC)   [place] [facility] at [facility], Daniel P, [place]               mirtazapine  (REMERON ) 45 MG tablet 90 tablet 0    Sig: Take 1 tablet (45 mg total) by mouth at bedtime.     Psychiatry: Antidepressants - mirtazapine  Passed - 03/27/2024 12:35 PM      Passed - Completed PHQ-2 or PHQ-9 in the last 360 days      Passed - Valid encounter within last 6 months    Recent Outpatient Visits           3 months ago Hemiparesis affecting right side as late effect of cerebrovascular accident (CVA) [facility])    [facility] at [facility], [place]               escitalopram  (LEXAPRO ) 20 MG tablet 90 tablet 0    Sig: Take 1 tablet (20 mg total) by mouth daily.     Psychiatry:  Antidepressants - SSRI Passed - 03/27/2024 12:35 PM      Passed - Completed PHQ-2 or PHQ-9 in the last 360 days      Passed - Valid encounter  within last 6 months    Recent Outpatient Visits           3 months ago Hemiparesis affecting right side as late effect of cerebrovascular accident (CVA) [facility])   [place] [facility] at [facility], Arleen Lacer, [place]              Refused Prescriptions Disp Refills   atorvastatin  (LIPITOR) 40 MG tablet 90 tablet 2    Sig: Take 1 tablet (40 mg total) by mouth daily.     Cardiovascular:  Antilipid - Statins Failed - 03/27/2024 12:35 PM      Failed - Lipid Panel in normal range within the last 12 months    Cholesterol, Total  Date Value Ref Range Status  08/15/2023 111 100 - 199 mg/dL Final   LDL Chol Calc (NIH)  Date Value Ref Range Status  08/15/2023 43 0 - 99 mg/dL Final   HDL  Date Value Ref Range Status  08/15/2023 52 >39 mg/dL Final   Triglycerides  Date Value Ref Range Status  08/15/2023 76 0 - 149  mg/dL Final         Passed - Patient is not pregnant      Passed - Valid encounter within last 12 months    Recent Outpatient Visits           3 months ago Hemiparesis affecting right side as late effect of cerebrovascular accident (CVA) [facility])   [facility] at [facility], Arleen Lacer, [place]

## 2024-03-30 ENCOUNTER — Encounter: Payer: MEDICAID | Admitting: Physical Therapy

## 2024-04-01 ENCOUNTER — Encounter: Payer: Self-pay | Admitting: Physician Assistant

## 2024-04-01 ENCOUNTER — Ambulatory Visit (INDEPENDENT_AMBULATORY_CARE_PROVIDER_SITE_OTHER): Payer: MEDICAID | Admitting: Physician Assistant

## 2024-04-01 VITALS — BP 110/72 | HR 84 | Temp 98.3°F | Ht 70.0 in | Wt 194.0 lb

## 2024-04-01 DIAGNOSIS — I69351 Hemiplegia and hemiparesis following cerebral infarction affecting right dominant side: Secondary | ICD-10-CM

## 2024-04-01 DIAGNOSIS — F17200 Nicotine dependence, unspecified, uncomplicated: Secondary | ICD-10-CM | POA: Diagnosis not present

## 2024-04-01 NOTE — Progress Notes (Signed)
 Date:  04/01/2024   Name:  Brian Rios   DOB:  [birth year]   MRN:  [id number]   Chief Complaint: Medical Management of Chronic Issues  HPI Brian Rios presents for 4 month follow up on chronic conditions. Right hemiparesis s/p CVA. Hx polysubstance abuse including alcohol, cannabis, tobacco.   Still smoking cigars daily and occasionally marijuana.  We have had multiple conversations about this, but he is not ready to quit tobacco despite several strokes.  He finally made follow-up appointment with neurology scheduled for 04/08/2024 for residual deficits after stroke. Presently it seems he just has some right hand weakness/coordination deficit.  He thankfully also started PT for this, with next visit tomorrow.  Sees [facility] for his schizophrenia and polysubstance use disorder. They have taken over [facility]  rx.  Medication list has been reviewed and updated.  Current Meds  Medication Sig   ABILIFY  MAINTENA 400 MG PRSY prefilled syringe SMARTSIG:1 Each IM Every 4 Weeks   acetaminophen  (TYLENOL ) 325 MG tablet Take 2 tablets (650 mg total) by mouth every 4 (four) hours as needed for mild pain (or temp > 37.5 C (99.5 F)).   ARIPiprazole  (ABILIFY ) 30 MG tablet Take 1 tablet (30 mg total) by mouth at bedtime.   aspirin  81 MG chewable tablet Chew 1 tablet (81 mg total) by mouth daily.   atorvastatin  (LIPITOR) 40 MG tablet Take 1 tablet (40 mg total) by mouth daily.   benztropine  (COGENTIN ) 1 MG tablet Take 1 tablet (1 mg total) by mouth at bedtime.   divalproex  (DEPAKOTE  ER) 250 MG 24 hr tablet Take 3 tablets (750 mg total) by mouth at bedtime.   escitalopram  (LEXAPRO ) 20 MG tablet Take 1 tablet (20 mg total) by mouth daily.   Lactobacillus (ACIDOPHILUS) CAPS capsule Take 1 capsule by mouth in the morning and at bedtime.   loperamide  (IMODIUM  A-D) 2 MG tablet Take 2 tablets (4 mg total) by mouth 3 (three) times daily as needed for diarrhea or loose stools.   mirtazapine  (REMERON ) 45 MG tablet  Take 1 tablet (45 mg total) by mouth at bedtime.   valbenazine  (INGREZZA ) 80 MG capsule Take 1 capsule (80 mg total) by mouth daily.     Review of Systems  Patient Active Problem List   Diagnosis Date Noted   Speech and language deficits 04/22/2023   Hemiparesis affecting right side as late effect of cerebrovascular accident (CVA) (HCC) 04/22/2023   Spasticity 04/22/2023   Chronic diarrhea 04/22/2023   Chronic schizophrenia (HCC) 04/01/2023   S/P right colectomy 09/04/2022   Dental decay 06/12/2022   Erectile dysfunction 07/26/2021   Hyperlipidemia LDL goal <70 03/28/2021   History of embolic stroke 07/18/2020   Drug abuse (HCC) 07/17/2020   Major depressive disorder, recurrent episode, severe (HCC) 02/20/2015   Alcohol use disorder, severe, dependence (HCC) 02/20/2015   Stimulant use disorder 02/20/2015   Tobacco use disorder 02/20/2015    No Known Allergies  Immunization History  Administered Date(s) Administered   Tdap 11/07/2013, 04/05/2016    Past Surgical History:  Procedure Laterality Date   BOWEL RESECTION N/A 09/04/2022   Procedure: SMALL BOWEL RESECTION, open;  Surgeon: Alben Alma, MD;  Location: [facility] ORS;  Service: General;  Laterality: N/A;   IR CT HEAD LTD  03/21/2023   IR CT HEAD LTD  03/21/2023   IR PERCUTANEOUS ART THROMBECTOMY/INFUSION INTRACRANIAL INC DIAG ANGIO  03/21/2023   ORIF ORBITAL FRACTURE  1985   PARTIAL COLECTOMY Right 09/04/2022   Procedure: PARTIAL COLECTOMY;  Surgeon: Alben Alma, MD;  Location: [facility] ORS;  Service: General;  Laterality: Right;  Right colectimy   RADIOLOGY WITH ANESTHESIA N/A 03/21/2023   Procedure: RADIOLOGY WITH ANESTHESIA;  Surgeon: Radiologist, Medication, MD;  Location: [facility];  Service: Radiology;  Laterality: N/A;   TEE WITHOUT CARDIOVERSION N/A 07/19/2020   Procedure: TRANSESOPHAGEAL ECHOCARDIOGRAM (TEE);  Surgeon: Michelle Aid, MD;  Location: [facility] ORS;  Service: Cardiovascular;  Laterality: N/A;   WRIST  SURGERY Left 11/07/2013   REPAIR ULNAR ARTERY, NERVE AND TENDON    Social History   Tobacco Use   Smoking status: Every Day    Current packs/day: 0.00    Average packs/day: 1 pack/day for 15.0 years (15.0 ttl pk-yrs)    Types: Cigars, Cigarettes    Start date: 03/2023    Last attempt to quit: 03/2023    Years since quitting: 1.0    Passive exposure: Current   Smokeless tobacco: Never   Tobacco comments:    Formerly cigarettes x15y. Now smoking about 5 cigars per day, no cigarettes  Vaping Use   Vaping status: Never Used  Substance Use Topics   Alcohol use: Not Currently    Comment: 3-4 forties 40 oz malt liquor roughly 3 days per week   Drug use: Yes    Frequency: 7.0 times per week    Types: Crack cocaine, Marijuana    Comment: Marijuana 3x/wk. Crack 2023    Family History  Problem Relation Age of Onset   Alcoholism Mother    Hypertension Mother    Breast cancer Mother    Alcoholism Father    Stroke Maternal Grandmother    Hypertension Maternal Grandmother    Diabetes Maternal Grandmother         04/01/2024    2:55 PM 12/13/2023    9:58 AM 08/15/2023    3:06 PM 05/30/2023   10:45 AM  GAD 7 : Generalized Anxiety Score  Nervous, Anxious, on Edge 1 0 1 0  Control/stop worrying 1 0 1 0  Worry too much - different things 1 0 1 0  Trouble relaxing 0 0 0 0  Restless 0 0 0 0  Easily annoyed or irritable 0 0 0 0  Afraid - awful might happen 0 0 0 0  Total GAD 7 Score 3 0 3 0  Anxiety Difficulty Not difficult at all Not difficult at all Not difficult at all Not difficult at all       04/01/2024    2:55 PM 12/13/2023    9:57 AM 08/15/2023    3:06 PM  Depression screen PHQ 2/9  Decreased Interest 1 0 0  Down, Depressed, Hopeless 1 0 2  PHQ - 2 Score 2 0 2  Altered sleeping 0 0 0  Tired, decreased energy 2 2 0  Change in appetite 0 0 0  Feeling bad or failure about yourself  0 0 0  Trouble concentrating 0 0 0  Moving slowly or fidgety/restless 0 0 0   Suicidal thoughts 0 0   PHQ-9 Score 4 2 2   Difficult doing work/chores Not difficult at all Not difficult at all Not difficult at all    BP Readings from Last 3 Encounters:  04/01/24 110/72  03/19/24 120/64  12/13/23 102/74    Wt Readings from Last 3 Encounters:  04/01/24 194 lb (88 kg)  12/13/23 196 lb (88.9 kg)  09/12/23 199 lb (90.3 kg)    BP 110/72   Pulse 84   Temp 98.3 F (  36.8 C)   Ht 5' 10 (1.778 m)   Wt 194 lb (88 kg)   SpO2 96%   BMI 27.84 kg/m   Physical Exam Vitals and nursing note reviewed.  Constitutional:      Appearance: Normal appearance.  Eyes:     Comments: Left exotropia  Neck:     Vascular: No carotid bruit.  Cardiovascular:     Rate and Rhythm: Normal rate and regular rhythm.     Heart sounds: No murmur heard.    No friction rub. No gallop.  Pulmonary:     Effort: Pulmonary effort is normal.     Breath sounds: Normal breath sounds.  Abdominal:     General: There is no distension.  Musculoskeletal:        General: Normal range of motion.     Comments: Strength 5/5 for flexion/extension of bilateral upper and lower extremity. Mild weakness in right hand grip strength compared to left.   Skin:    General: Skin is warm and dry.  Neurological:     Mental Status: He is alert and oriented to person, place, and time.     Gait: Gait is intact.     Comments: Gait intact without assistance. Some speech difficulty apparent, but patient is mostly communicative and easy to understand.    Psychiatric:        Mood and Affect: Mood and affect normal.     Recent Labs     Component Value Date/Time   NA 143 08/15/2023 1616   NA 137 02/14/2015 1915   K 4.4 08/15/2023 1616   K 3.7 02/14/2015 1915   CL 107 (H) 08/15/2023 1616   CL 107 02/14/2015 1915   CO2 24 08/15/2023 1616   CO2 21 (L) 02/14/2015 1915   GLUCOSE 86 08/15/2023 1616   GLUCOSE 96 03/28/2023 1038   GLUCOSE 85 02/14/2015 1915   BUN 11 08/15/2023 1616   BUN 12 02/14/2015 1915    CREATININE 0.96 08/15/2023 1616   CREATININE 0.88 02/14/2015 1915   CALCIUM  9.1 08/15/2023 1616   CALCIUM  9.3 02/14/2015 1915   PROT 7.2 08/15/2023 1616   PROT 8.3 (H) 02/14/2015 1915   ALBUMIN 4.3 08/15/2023 1616   ALBUMIN 4.7 02/14/2015 1915   AST 16 08/15/2023 1616   AST 25 02/14/2015 1915   ALT 14 08/15/2023 1616   ALT 32 02/14/2015 1915   ALKPHOS 88 08/15/2023 1616   ALKPHOS 62 02/14/2015 1915   BILITOT <0.2 08/15/2023 1616   BILITOT 0.3 02/14/2015 1915   GFRNONAA >60 03/28/2023 1038   GFRNONAA >60 02/14/2015 1915   GFRAA >60 07/17/2020 1338   GFRAA >60 02/14/2015 1915    Lab Results  Component Value Date   WBC 9.3 08/15/2023   HGB 13.8 08/15/2023   HCT 42.8 08/15/2023   MCV 86 08/15/2023   PLT 228 08/15/2023   Lab Results  Component Value Date   HGBA1C 5.6 03/23/2023   Lab Results  Component Value Date   CHOL 111 08/15/2023   HDL 52 08/15/2023   LDLCALC 43 08/15/2023   TRIG 76 08/15/2023   CHOLHDL 2.1 08/15/2023   Lab Results  Component Value Date   TSH 4.040 03/28/2021     Assessment and Plan:  Hemiparesis affecting right side as late effect of cerebrovascular accident (CVA) [facility]) Assessment & Plan: Emphasized the importance of neurology follow-up next week; appointment reminder printed with contact information for their office if needed.  Continue PT, OT, and speech therapy  Tobacco use disorder Assessment & Plan: Patient advised that smoking cessation is probably the single best thing he could do to reduce his risk of future stroke and improve his health overall. He is precontemplative. Says he is going to drive until the wheels fall off       Return in about 4 months (around 08/01/2024) for OV f/u chronic conditions.    Cody Das, PA-C, DMSc, Nutritionist [facility] [phone]

## 2024-04-01 NOTE — Assessment & Plan Note (Signed)
 Patient advised that smoking cessation is probably the single best thing he could do to reduce his risk of future stroke and improve his health overall. He is precontemplative. Says he is going to drive until the wheels fall off

## 2024-04-01 NOTE — Assessment & Plan Note (Signed)
 Emphasized the importance of neurology follow-up next week; appointment reminder printed with contact information for their office if needed.  Continue PT, OT, and speech therapy

## 2024-04-02 ENCOUNTER — Encounter: Payer: MEDICAID | Admitting: Physical Therapy

## 2024-04-02 ENCOUNTER — Ambulatory Visit: Payer: MEDICAID | Attending: Physician Assistant | Admitting: Physical Therapy

## 2024-04-02 DIAGNOSIS — R278 Other lack of coordination: Secondary | ICD-10-CM | POA: Insufficient documentation

## 2024-04-02 DIAGNOSIS — M6281 Muscle weakness (generalized): Secondary | ICD-10-CM | POA: Diagnosis present

## 2024-04-02 DIAGNOSIS — I63512 Cerebral infarction due to unspecified occlusion or stenosis of left middle cerebral artery: Secondary | ICD-10-CM | POA: Diagnosis present

## 2024-04-02 DIAGNOSIS — R2681 Unsteadiness on feet: Secondary | ICD-10-CM | POA: Insufficient documentation

## 2024-04-02 NOTE — Therapy (Signed)
 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   left cerebrum   Erectile dysfunction    Heart murmur    Hyperlipidemia    Schizophrenia (HCC)    Tobacco abuse    Past Surgical History:  Procedure Laterality Date   BOWEL RESECTION N/A 09/04/2022   Procedure: SMALL BOWEL RESECTION, open;  Surgeon: Alben Alma, MD;  Location: [facility] ORS;  Service: General;  Laterality: N/A;   IR CT HEAD LTD  03/21/2023   IR CT HEAD LTD  03/21/2023   IR PERCUTANEOUS ART THROMBECTOMY/INFUSION INTRACRANIAL INC DIAG ANGIO  03/21/2023   ORIF ORBITAL FRACTURE  1985   PARTIAL COLECTOMY Right 09/04/2022   Procedure: PARTIAL COLECTOMY;  Surgeon: Alben Alma, MD;  Location: [facility] ORS;  Service: General;  Laterality: Right;  Right colectimy   RADIOLOGY WITH ANESTHESIA N/A 03/21/2023   Procedure: RADIOLOGY WITH ANESTHESIA;  Surgeon: Radiologist, Medication, MD;  Location: [facility];  Service: Radiology;  Laterality: N/A;   TEE WITHOUT CARDIOVERSION N/A 07/19/2020   Procedure: TRANSESOPHAGEAL ECHOCARDIOGRAM (TEE);  Surgeon: Michelle Aid, MD;  Location: [facility] ORS;  Service: Cardiovascular;  Laterality: N/A;   WRIST SURGERY Left 11/07/2013   REPAIR ULNAR ARTERY, NERVE AND TENDON   Patient Active Problem List   Diagnosis Date Noted   Speech and language deficits 04/22/2023   Hemiparesis affecting right side as late effect of  cerebrovascular accident (CVA) (HCC) 04/22/2023   Spasticity 04/22/2023   Chronic diarrhea 04/22/2023   Chronic schizophrenia (HCC) 04/01/2023   S/P right colectomy 09/04/2022   Dental decay 06/12/2022   Erectile dysfunction 07/26/2021   Hyperlipidemia LDL goal <70 03/28/2021   History of embolic stroke 07/18/2020   Drug abuse (HCC) 07/17/2020   Major depressive disorder, recurrent episode, severe (HCC) 02/20/2015   Alcohol use disorder, severe, dependence (HCC) 02/20/2015   Stimulant use disorder 02/20/2015   Tobacco use disorder 02/20/2015    PCP: Arleen Lacer. Larkin [place], [place]  REFERRING PROVIDER: Arleen Lacer. [place], [place]  REFERRING DIAGNOSIS: [id number] (ICD-10-CM) - Hemiparesis affecting right side as late effect of cerebrovascular accident (CVA) (HCC)   THERAPY DIAG: Left middle cerebral artery stroke (HCC)  Muscle weakness (generalized)  Other lack of coordination  Unsteadiness on feet  ONSET DATE: 03/21/23 (ED to hospital admission, CVA)  FOLLOW UP APPT WITH PROVIDER: Yes ; 03/11/24   RATIONALE FOR EVALUATION AND TREATMENT: Rehabilitation  Pertinent History  43 y.o. 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  Patient reports that he has been losing his balance while he's walking. Tends to lose balance backwards. Difficulty  expressing self due to aphasia.   Pt currently referred for R hemiparesis after stroke. Pt also has referrals for speech and OT.   Pain: No Numbness/Tingling: Yes - R forearm and hand  Focal Weakness: No Recent changes in overall health/medication: No Prior history of physical therapy for balance:  No Falls: Has patient fallen in last 6 months? 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, goats) Red flags (bowel/bladder changes, saddle paresthesia, personal history of cancer, h/o spinal tumors, h/o compression fx, h/o abdominal aneurysm, abdominal pain, chills/fever, night sweats, nausea, vomiting, unrelenting pain): Negative  Precautions: None  Weight Bearing Restrictions: No  Living Environment Lives with: lives with mother  Lives in: Mobile home Has following equipment at home: shower chair   Patient Goals: to reduce falls and to improve balance      OBJECTIVE:   Patient Surveys  ABC: 68.8% (unsure if patient fully understood meaning of questionnaire)  Cognition Patient is oriented to person, place, and time.  Recent memory is intact.  Remote memory is impaired.  Attention span and concentration are intact.  Expressive speech is impaired.      Gross Musculoskeletal Assessment Tremor: Clonus BLE Bulk: Normal Tone: Mild to moderate spasticity for R upper/lower extremity flexors   GAIT: Distance walked: 46'  Assistive device utilized: None Level of assistance: Complete Independence Comments: drifts L/R, decreased foot clearance bilaterally intermittently, rounded shoulders    LE MMT:  MMT (out of 5) Right 03/11/2024 Left 03/11/2024  Hip flexion 4 4  Hip extension    Hip abduction 4 4  Hip adduction 4 4  Hip internal rotation    Hip external rotation    Knee flexion 4+ 4+  Knee extension 4+ 4+  Ankle dorsiflexion 4+ 4+  Ankle plantarflexion    Ankle inversion    Ankle eversion    (* = pain; Blank rows = not tested)   Sensation Grossly intact to light touch bilateral LEs as determined by testing dermatomes L2-S2. Proprioception, and hot/cold testing deferred on this date.   Reflexes R/L Knee Jerk (L3/4):deferred  Ankle Jerk (S1/2): deferred    Cranial Nerves  Deferred     Coordination/Cerebellar Finger to Nose: Slowed with RUE Heel to Shin: slightly impaired R LE Rapid alternating movements: impaired R LE Finger Opposition: unable on R UE, intact LUE Pronator Drift: Positive    Modified Ashworth Scale Biceps: R 3, L 1 Wrist flexors: R 0, L 0 Hamstrings: R 2, L 1 Gastrocnemius: R 3, L 3  0 No increase in tone 1 slight increase in tone giving a catch when slight increase in muscle tone, manifested by the limb was moved in flexion or extension. 1+ slight increase in muscle tone, manifested by a catch followed by minimal resistance throughout (ROM ) 2 more marked increase in tone but more marked increased in muscle tone through most limb easily flexed 3 considerable increase in tone, passive movement difficult 4 limb rigid in flexion or extension     FUNCTIONAL OUTCOME MEASURES   Results Comments  BERG 54/56 Fall risk, in need of intervention  DGI 20/24   5TSTS 8.80 seconds   6 Minute Walk Test 1260 ft   Mini-BESTest 20/28   (Blank rows = not tested)   Clinical Test of Sensory Interaction for Balance    (CTSIB):  CONDITION TIME  STRATEGY SWAY  Eyes open, firm surface 30 seconds ankle Minimal sway  Eyes closed, firm surface 30 seconds Ankle/hip Mild sway  Eyes open, foam surface 30 seconds ankle   Eyes closed, foam surface 30 seconds ankle Increased hip strategy, no significant LOB      TODAY'S TREATMENT 04/02/24   SUBJECTIVE STATEMENT:   Patient reports no recent falls or safety issues. Pt has not yet scheduled with [facility] rehab for OT/speech.    NuStep; Level 3, x 5 minutes - for improved soft tissue mobility and increased tissue temperature to improve muscle performance   -subjective gathered during this time    Neuromuscular Re-education - for improved sensory integration, static and dynamic postural control, equilibrium and non-equilibrium coordination as needed for negotiating home and community environment and  stepping over obstacles  Performance of Mini-BESTest Completion of mCTSIB (see above)   Toe tapping; 12-inch step (staircase center of gym); 1 x 10 alt R/L Toe tapping; 12-inch step with pt standing on [employer] (staircase center of gym); 1 x 15 alt R/L  Alternating cone tap; 5 cones on R and L; x5 D/B  (On blue agility ladder)  Standing FT with perturbations; x 1-minute bout with multi-directional perturbations  -no significant LOB   *Next visit* Hurdle stepping  (3) 6-inch hurdles; x 2 D/B; 2 D/B   -performed with relative ease  (3) 6-inch hurdles + (2) 12-inch hurdles; x 4 D/B        PATIENT EDUCATION:  Education details: Discussed results of testing, goals of PT, cut-off scores for fall risk measures, plan of care. Discussed use of toe tapping drill on porch steps at home to work on standing coordination and adequate clearing of toes with each step.  Person educated: Patient Education method: Explanation, Demonstration, and Handouts Education comprehension: verbalized understanding and returned demonstration   HOME EXERCISE PROGRAM: Access Code: [email or web address] URL: [email or web address] Date: 03/11/2024 Prepared by: Janine Melbourne  Exercises - Sit to Stand with Arms Crossed  - 2-3 x daily - 5-7 x weekly - 3 sets - 10 reps - Standing March with Counter Support  - 2-3 x daily - 5-7 x weekly - 3 sets - 10 reps - Heel Raises with Counter Support  - 2-3 x daily - 5-7 x weekly - 3 sets - 10 reps   ASSESSMENT:  CLINICAL IMPRESSION: Patient is able to maintain 30 seconds in standing with all conditions attempted with mCTSIB. Pt has Mini-BESTest score superior to cut-off for post-CVA patients (>17.5). He is most challenged with reactive balance and dual task TUG (standard TUG is relatively easy for patient). Pt will benefit from addition of dual task training with gait and obstacle negotiation tasks to improve automaticity of daily functional movements and improve  motor control. Patient has remaining deficits in BLE weakness, RUE/RLE coordination impairment, R upper and lower limb flexor spasticity of mild to moderate magnitude, gait changes, and intermittent postural control impairments with pt tending to loss balance backwards. Patient will benefit from skilled PT to address above impairments and improve overall function.  REHAB POTENTIAL: Fair    CLINICAL DECISION MAKING: Evolving/moderate complexity  EVALUATION COMPLEXITY: Moderate   GOALS: Goals reviewed with patient? Yes  SHORT TERM GOALS: Target date: 04/22/2024  Pt will be independent with HEP in order to improve strength and balance in order to decrease fall risk and improve function at home. Baseline: 5/21: initiated Goal status: INITIAL   LONG TERM GOALS: Target date: 06/03/2024   1.  Pt will  improve BERG by at least 3 points in order to demonstrate clinically significant improvement in balance.   Baseline: 5/21: to be completed visit #2.   03/19/24: 54/56 Goal status: INITIAL  2.  Pt will improve ABC by at least 13% in order to demonstrate clinically significant improvement in balance confidence.      Baseline: 5/21: 68.8% Goal status: INITIAL  3. Pt will improve DGI by at least 3 points in order to demonstrate clinically significant improvement in balance and decreased risk for falls.     Baseline: 5/21: 20/24  Goal status: INITIAL  4. Pt will increase by at least 69m (144ft) in order to demonstrate clinically significant improvement in cardiopulmonary endurance and community ambulation   Baseline: 5/21: to be completed visit #2.   03/19/24: 1260 ft.  Goal status: INITIAL  3. Pt will improve MiniBEST by at least 4 points in order to demonstrate clinically significant improvement in balance and decreased risk for falls.     Baseline: 6/12: 20/28 Goal status: INITIAL  PLAN: PT FREQUENCY: 1-2x/week  PT DURATION: 12 weeks  PLANNED INTERVENTIONS: Therapeutic exercises,  Therapeutic activity, Neuromuscular re-education, Balance training, Gait training, Patient/Family education, Joint manipulation, Joint mobilization, Canalith repositioning, Aquatic Therapy, Dry Needling, Cognitive remediation, Electrical stimulation, Spinal manipulation, Spinal mobilization, Cryotherapy, Moist heat, Traction, Ultrasound, Ionotophoresis 4mg /ml Dexamethasone , and Manual therapy  PLAN FOR NEXT SESSION: Balance and BLE strengthening, reactive balance, dual task training    Denese Finn, PT, DPT [phone]  Aleatha Hunting 04/07/2024, 11:20 AM

## 2024-04-08 ENCOUNTER — Ambulatory Visit: Payer: MEDICAID | Admitting: Physical Therapy

## 2024-04-08 NOTE — Therapy (Deleted)
 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   left cerebrum   Erectile dysfunction    Heart murmur    Hyperlipidemia    Schizophrenia (HCC)    Tobacco abuse    Past Surgical History:  Procedure Laterality Date   BOWEL RESECTION N/A 09/04/2022   Procedure: SMALL BOWEL RESECTION, open;  Surgeon: Alben Alma, MD;  Location: [facility];  Service: General;  Laterality: N/A;   IR CT HEAD LTD  03/21/2023   IR CT HEAD LTD  03/21/2023   IR PERCUTANEOUS ART THROMBECTOMY/INFUSION INTRACRANIAL INC DIAG ANGIO  03/21/2023   ORIF ORBITAL FRACTURE  1985   PARTIAL COLECTOMY Right 09/04/2022   Procedure: PARTIAL COLECTOMY;  Surgeon: Alben Alma, MD;  Location: [facility];  Service: General;  Laterality: Right;  Right colectimy   RADIOLOGY WITH ANESTHESIA N/A 03/21/2023   Procedure: RADIOLOGY WITH ANESTHESIA;  Surgeon: Radiologist, Medication, MD;  Location: [facility];  Service: Radiology;  Laterality: N/A;   TEE WITHOUT CARDIOVERSION N/A 07/19/2020   Procedure: TRANSESOPHAGEAL ECHOCARDIOGRAM (TEE);  Surgeon: Michelle Aid, MD;  Location: [facility];  Service: Cardiovascular;  Laterality: N/A;   WRIST SURGERY Left 11/07/2013   REPAIR ULNAR ARTERY, NERVE AND TENDON   Patient Active Problem List   Diagnosis Date Noted   Speech and language deficits 04/22/2023   Hemiparesis affecting right side as late effect of cerebrovascular accident (CVA) (HCC) 04/22/2023   Spasticity 04/22/2023   Chronic diarrhea 04/22/2023   Chronic schizophrenia (HCC) 04/01/2023   S/P right colectomy 09/04/2022   Dental decay 06/12/2022   Erectile dysfunction 07/26/2021   Hyperlipidemia LDL goal <70 03/28/2021   History of embolic stroke 07/18/2020   Drug abuse  (HCC) 07/17/2020   Major depressive disorder, recurrent episode, severe (HCC) 02/20/2015   Alcohol use disorder, severe, dependence (HCC) 02/20/2015   Stimulant use disorder 02/20/2015   Tobacco use disorder 02/20/2015    PCP: Arleen Lacer. L[place], [place]  REFERRING PROVIDER: Arleen Lacer. [place], [place]  REFERRING DIAGNOSIS: [phone] (ICD-10-CM) - Hemiparesis affecting right side as late effect of cerebrovascular accident (CVA) (HCC)   THERAPY DIAG: Left middle cerebral artery stroke (HCC)  Muscle weakness (generalized)  Unsteadiness on feet  ONSET DATE: 03/21/23 (ED to hospital admission, CVA)  FOLLOW UP APPT WITH PROVIDER: Yes ; 03/11/24   RATIONALE FOR EVALUATION AND TREATMENT: Rehabilitation  Pertinent History  43 y.o. 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  Patient reports that he has been losing his balance while he's walking. Tends to lose balance backwards. Difficulty expressing self due to aphasia.   Pt currently referred for R hemiparesis after stroke. Pt also has referrals for speech and OT.   Pain: No Numbness/Tingling: Yes - R forearm and hand  Focal Weakness: No Recent changes in overall health/medication: No Prior history of physical therapy for balance:  No Falls: Has patient fallen in last 6 months? 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, goats) Red flags (bowel/bladder changes, saddle paresthesia, personal history of cancer, h/o spinal tumors, h/o compression fx, h/o abdominal aneurysm, abdominal pain, chills/fever, night sweats, nausea, vomiting, unrelenting pain):  Negative  Precautions: None  Weight Bearing Restrictions: No  Living Environment Lives with: lives with mother  Lives in: Mobile home Has following equipment at home: shower chair   Patient Goals: to reduce falls and to improve balance      OBJECTIVE:   Patient Surveys  ABC: 68.8% (unsure if patient fully understood meaning of questionnaire)  Cognition Patient is oriented to person, place, and time.  Recent memory is intact.  Remote memory is impaired.  Attention span and concentration are intact.  Expressive speech is impaired.      Gross Musculoskeletal Assessment Tremor: Clonus BLE Bulk: Normal Tone: Mild to moderate spasticity for R upper/lower extremity flexors   GAIT: Distance walked: 42'  Assistive device utilized: None Level of assistance: Complete Independence Comments: drifts L/R, decreased foot clearance bilaterally intermittently, rounded shoulders    LE MMT:  MMT (out of 5) Right 03/11/2024 Left 03/11/2024  Hip flexion 4 4  Hip extension    Hip abduction 4 4  Hip adduction 4 4  Hip internal rotation    Hip external rotation    Knee flexion 4+ 4+  Knee extension 4+ 4+  Ankle dorsiflexion 4+ 4+  Ankle plantarflexion    Ankle inversion    Ankle eversion    (* = pain; Blank rows = not tested)   Sensation Grossly intact to light touch bilateral LEs as determined by testing dermatomes L2-S2. Proprioception, and hot/cold testing deferred on this date.   Reflexes R/L Knee Jerk (L3/4):deferred  Ankle Jerk (S1/2): deferred    Cranial Nerves  Deferred    Coordination/Cerebellar Finger to Nose: Slowed with RUE Heel to Shin: slightly impaired R LE Rapid alternating movements: impaired R LE Finger Opposition: unable on R UE, intact LUE Pronator Drift: Positive    Modified Ashworth Scale Biceps: R 3, L 1 Wrist flexors: R 0, L 0 Hamstrings: R 2, L 1 Gastrocnemius: R 3, L 3  0 No increase in tone 1 slight increase in tone giving a  catch when slight increase in muscle tone, manifested by the limb was moved in flexion or extension. 1+ slight increase in muscle tone, manifested by a catch followed by minimal resistance throughout (ROM ) 2 more marked increase in tone but more marked increased in muscle tone through most limb easily flexed 3 considerable increase in tone, passive movement difficult 4 limb rigid in flexion or extension     FUNCTIONAL OUTCOME MEASURES   Results Comments  BERG 54/56 Fall risk, in need of intervention  DGI 20/24   5TSTS 8.80 seconds   6 Minute Walk Test 1260 ft   Mini-BESTest 20/28   (Blank rows = not tested)   Clinical Test of Sensory Interaction for Balance    (CTSIB):  CONDITION TIME STRATEGY SWAY  Eyes open, firm surface 30 seconds ankle Minimal sway  Eyes closed, firm surface 30 seconds Ankle/hip Mild sway  Eyes open, foam surface 30 seconds ankle   Eyes closed, foam surface 30 seconds ankle Increased hip strategy, no significant LOB      TODAY'S TREATMENT 04/02/24   SUBJECTIVE STATEMENT:   Patient reports no recent falls or safety issues. Pt has not yet scheduled with [facility] rehab for OT/speech.    NuStep; Level  3, x 5 minutes - for improved soft tissue mobility and increased tissue temperature to improve muscle performance   -subjective gathered during this time    Neuromuscular Re-education - for improved sensory integration, static and dynamic postural control, equilibrium and non-equilibrium coordination as needed for negotiating home and community environment and stepping over obstacles  Performance of Mini-BESTest Completion of mCTSIB (see above)   Toe tapping; 12-inch step (staircase center of gym); 1 x 10 alt R/L Toe tapping; 12-inch step with pt standing on [employer] (staircase center of gym); 1 x 15 alt R/L  Alternating cone tap; 5 cones on R and L; x5 D/B  (On blue agility ladder)  Standing FT with perturbations; x 1-minute bout with  multi-directional perturbations  -no significant LOB   *Next visit* Hurdle stepping  (3) 6-inch hurdles; x 2 D/B; 2 D/B   -performed with relative ease  (3) 6-inch hurdles + (2) 12-inch hurdles; x 4 D/B        PATIENT EDUCATION:  Education details: Discussed results of testing, goals of PT, cut-off scores for fall risk measures, plan of care. Discussed use of toe tapping drill on porch steps at home to work on standing coordination and adequate clearing of toes with each step.  Person educated: Patient Education method: Explanation, Demonstration, and Handouts Education comprehension: verbalized understanding and returned demonstration   HOME EXERCISE PROGRAM: Access Code: [email or web address] URL: [email or web address] Date: 03/11/2024 Prepared by: Janine Melbourne  Exercises - Sit to Stand with Arms Crossed  - 2-3 x daily - 5-7 x weekly - 3 sets - 10 reps - Standing March with Counter Support  - 2-3 x daily - 5-7 x weekly - 3 sets - 10 reps - Heel Raises with Counter Support  - 2-3 x daily - 5-7 x weekly - 3 sets - 10 reps   ASSESSMENT:  CLINICAL IMPRESSION: Patient is able to maintain 30 seconds in standing with all conditions attempted with mCTSIB. Pt has Mini-BESTest score superior to cut-off for post-CVA patients (>17.5). He is most challenged with reactive balance and dual task TUG (standard TUG is relatively easy for patient). Pt will benefit from addition of dual task training with gait and obstacle negotiation tasks to improve automaticity of daily functional movements and improve motor control. Patient has remaining deficits in BLE weakness, RUE/RLE coordination impairment, R upper and lower limb flexor spasticity of mild to moderate magnitude, gait changes, and intermittent postural control impairments with pt tending to loss balance backwards. Patient will benefit from skilled PT to address above impairments and improve overall function.  REHAB POTENTIAL: Fair     CLINICAL DECISION MAKING: Evolving/moderate complexity  EVALUATION COMPLEXITY: Moderate   GOALS: Goals reviewed with patient? Yes  SHORT TERM GOALS: Target date: 04/22/2024  Pt will be independent with HEP in order to improve strength and balance in order to decrease fall risk and improve function at home. Baseline: 5/21: initiated Goal status: INITIAL   LONG TERM GOALS: Target date: 06/03/2024   1.  Pt will improve BERG by at least 3 points in order to demonstrate clinically significant improvement in balance.   Baseline: 5/21: to be completed visit #2.   03/19/24: 54/56 Goal status: INITIAL  2.  Pt will improve ABC by at least 13% in order to demonstrate clinically significant improvement in balance confidence.      Baseline: 5/21: 68.8% Goal status: INITIAL  3. Pt will improve DGI by at least 3 points in order to demonstrate clinically significant improvement in  balance and decreased risk for falls.     Baseline: 5/21: 20/24  Goal status: INITIAL  4. Pt will increase by at least 44m (140ft) in order to demonstrate clinically significant improvement in cardiopulmonary endurance and community ambulation   Baseline: 5/21: to be completed visit #2.   03/19/24: 1260 ft.  Goal status: INITIAL  3. Pt will improve MiniBEST by at least 4 points in order to demonstrate clinically significant improvement in balance and decreased risk for falls.     Baseline: 6/12: 20/28 Goal status: INITIAL  PLAN: PT FREQUENCY: 1-2x/week  PT DURATION: 12 weeks  PLANNED INTERVENTIONS: Therapeutic exercises, Therapeutic activity, Neuromuscular re-education, Balance training, Gait training, Patient/Family education, Joint manipulation, Joint mobilization, Canalith repositioning, Aquatic Therapy, Dry Needling, Cognitive remediation, Electrical stimulation, Spinal manipulation, Spinal mobilization, Cryotherapy, Moist heat, Traction, Ultrasound, Ionotophoresis 4mg /ml Dexamethasone , and Manual  therapy  PLAN FOR NEXT SESSION: Balance and BLE strengthening, reactive balance, dual task training    Denese Finn, PT, DPT [phone]  Aleatha Hunting 04/08/2024, 7:26 AM

## 2024-04-09 ENCOUNTER — Encounter: Payer: MEDICAID | Admitting: Physical Therapy

## 2024-04-15 ENCOUNTER — Encounter: Payer: MEDICAID | Admitting: Physical Therapy

## 2024-04-15 ENCOUNTER — Ambulatory Visit: Payer: MEDICAID

## 2024-04-15 ENCOUNTER — Ambulatory Visit: Payer: MEDICAID | Admitting: Physical Therapy

## 2024-04-16 ENCOUNTER — Encounter: Payer: MEDICAID | Admitting: Physical Therapy

## 2024-04-21 ENCOUNTER — Ambulatory Visit: Payer: MEDICAID | Attending: Physician Assistant | Admitting: Physical Therapy

## 2024-04-21 ENCOUNTER — Ambulatory Visit: Payer: MEDICAID

## 2024-04-21 DIAGNOSIS — R41841 Cognitive communication deficit: Secondary | ICD-10-CM | POA: Diagnosis not present

## 2024-04-21 DIAGNOSIS — M6281 Muscle weakness (generalized): Secondary | ICD-10-CM

## 2024-04-21 DIAGNOSIS — I69351 Hemiplegia and hemiparesis following cerebral infarction affecting right dominant side: Secondary | ICD-10-CM | POA: Diagnosis present

## 2024-04-21 DIAGNOSIS — R4701 Aphasia: Secondary | ICD-10-CM

## 2024-04-21 DIAGNOSIS — R471 Dysarthria and anarthria: Secondary | ICD-10-CM | POA: Insufficient documentation

## 2024-04-21 DIAGNOSIS — I63512 Cerebral infarction due to unspecified occlusion or stenosis of left middle cerebral artery: Secondary | ICD-10-CM | POA: Diagnosis present

## 2024-04-21 DIAGNOSIS — R2681 Unsteadiness on feet: Secondary | ICD-10-CM | POA: Diagnosis present

## 2024-04-21 DIAGNOSIS — R482 Apraxia: Secondary | ICD-10-CM | POA: Diagnosis present

## 2024-04-21 DIAGNOSIS — R278 Other lack of coordination: Secondary | ICD-10-CM | POA: Diagnosis present

## 2024-04-21 NOTE — Therapy (Signed)
 OUTPATIENT PHYSICAL THERAPY NEURO TREATMENT   Patient Name: Brian Rios MRN: [id number] DOB:Jun 01, 1981, 43 y.o., male Today's Date: 04/02/2024  END OF SESSION:    PT End of Session - 04/21/24 1332     Visit Number 4    Number of Visits 25    Date for PT Re-Evaluation 06/03/24    PT Start Time 1332    PT Stop Time 1401    PT Time Calculation (min) 29 min    Activity Tolerance Patient tolerated treatment well;Other (comment)   patient with late arrival to therapy appointment   Behavior During Therapy [facility] for tasks assessed/performed           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   left cerebrum   Erectile dysfunction    Heart murmur    Hyperlipidemia    Schizophrenia (HCC)    Tobacco abuse    Past Surgical History:  Procedure Laterality Date   BOWEL RESECTION N/A 09/04/2022   Procedure: SMALL BOWEL RESECTION, open;  Surgeon: Jordis Laneta FALCON, MD;  Location: [facility] ORS;  Service: General;  Laterality: N/A;   IR CT HEAD LTD  03/21/2023   IR CT HEAD LTD  03/21/2023   IR PERCUTANEOUS ART THROMBECTOMY/INFUSION INTRACRANIAL INC DIAG ANGIO  03/21/2023   ORIF ORBITAL FRACTURE  1985   PARTIAL COLECTOMY Right 09/04/2022   Procedure: PARTIAL COLECTOMY;  Surgeon: Jordis Laneta FALCON, MD;  Location: [facility] OR[facility];  Service: General;  Laterality: Right;  Right colectimy   RADIOLOGY WITH ANESTHESIA N/A 03/21/2023   Procedure: RADIOLOGY WITH ANESTHESIA;  Surgeon: Radiologist, Medication, MD;  Location: [facility];  Service: Radiology;  Laterality: N/A;   TEE WITHOUT CARDIOVERSION N/A 07/19/2020   Procedure: TRANSESOPHAGEAL ECHOCARDIOGRAM (TEE);  Surgeon: Hester Wolm PARAS, MD;  Location: [facility];  Service: Cardiovascular;  Laterality: N/A;   WRIST SURGERY Left 11/07/2013   REPAIR ULNAR ARTERY, NERVE AND TENDON   Patient Active Problem List   Diagnosis Date Noted   Speech and language  deficits 04/22/2023   Hemiparesis affecting right side as late effect of cerebrovascular accident (CVA) (HCC) 04/22/2023   Spasticity 04/22/2023   Chronic diarrhea 04/22/2023   Chronic schizophrenia (HCC) 04/01/2023   S/P right colectomy 09/04/2022   Dental decay 06/12/2022   Erectile dysfunction 07/26/2021   Hyperlipidemia LDL goal <70 03/28/2021   History of embolic stroke 07/18/2020   Drug abuse (HCC) 07/17/2020   Major depressive disorder, recurrent episode, severe (HCC) 02/20/2015   Alcohol use disorder, severe, dependence (HCC) 02/20/2015   Stimulant use disorder 02/20/2015   Tobacco use disorder 02/20/2015    PCP: Toribio SQUIBB. [place], [place]  REFERRING PROVIDER: Toribio SQUIBB. [place], [place]  REFERRING DIAGNOSIS: [id number] (ICD-10-CM) - Hemiparesis affecting right side as late effect of cerebrovascular accident (CVA) (HCC)   THERAPY DIAG: Left middle cerebral artery stroke (HCC)  Muscle weakness (generalized)  Other lack of coordination  Unsteadiness on feet  Apraxia   ONSET DATE: 03/21/23 (ED to hospital admission, CVA)  FOLLOW UP APPT WITH PROVIDER: Yes ; 03/11/24   RATIONALE FOR EVALUATION AND TREATMENT: Rehabilitation  Pertinent History  Daily subjective documented below in treatment session of notes.    43 y.o. 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  Patient reports that he has been losing his balance while he's walking. Tends to lose balance backwards. Difficulty expressing self due to aphasia.   Pt currently referred for R hemiparesis after stroke. Pt also has referrals for speech and OT.   Pain: No Numbness/Tingling: Yes - R forearm and hand  Focal Weakness: No Recent changes in overall health/medication: No Prior history of physical  therapy for balance:  No Falls: Has patient fallen in last 6 months? 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, goats) Red flags (bowel/bladder changes, saddle paresthesia, personal history of cancer, h/o spinal tumors, h/o compression fx, h/o abdominal aneurysm, abdominal pain, chills/fever, night sweats, nausea, vomiting, unrelenting pain): Negative  Precautions: None  Weight Bearing Restrictions: No  Living Environment Lives with: lives with mother  Lives in: Mobile home Has following equipment at home: shower chair   Patient Goals: to reduce falls and to improve balance      OBJECTIVE:   Patient Surveys  ABC: 68.8% (unsure if patient fully understood meaning of questionnaire)  Cognition Patient is oriented to person, place, and time.  Recent memory is intact.  Remote memory is impaired.  Attention span and concentration are intact.  Expressive speech is impaired.      Gross Musculoskeletal Assessment Tremor: Clonus BLE Bulk: Normal Tone: Mild to moderate spasticity for R upper/lower extremity flexors   GAIT: Distance walked: 48'  Assistive device utilized: None Level of assistance: Complete Independence Comments: drifts L/R, decreased foot clearance bilaterally intermittently, rounded shoulders    LE MMT:  MMT (out of 5) Right 03/11/2024 Left 03/11/2024  Hip flexion 4 4  Hip extension    Hip abduction 4 4  Hip adduction 4 4  Hip internal rotation    Hip external rotation    Knee flexion 4+ 4+  Knee extension 4+ 4+  Ankle dorsiflexion 4+ 4+  Ankle plantarflexion    Ankle inversion    Ankle eversion    (* = pain; Blank rows = not tested)   Sensation Grossly intact to light touch bilateral LEs as determined by testing dermatomes L2-S2. Proprioception, and hot/cold testing  deferred on this date.   Reflexes R/L Knee Jerk (L3/4):deferred  Ankle Jerk (S1/2): deferred    Cranial Nerves  Deferred    Coordination/Cerebellar Finger to Nose: Slowed with RUE Heel to Shin: slightly impaired R LE Rapid alternating movements: impaired R LE Finger Opposition: unable on R UE, intact LUE Pronator Drift: Positive    Modified Ashworth Scale Biceps: R 3, L 1 Wrist flexors: R 0, L 0 Hamstrings: R 2, L 1 Gastrocnemius: R 3, L 3  0 No increase in tone 1 slight increase in tone giving a catch when slight increase in muscle tone, manifested by the limb was moved in flexion or extension. 1+ slight increase in muscle tone, manifested by a catch followed by minimal resistance throughout (ROM ) 2 more marked increase in tone but more marked increased in muscle tone through most limb easily flexed 3 considerable increase in tone, passive movement difficult 4 limb rigid in flexion or extension     FUNCTIONAL OUTCOME MEASURES   Results Comments  BERG 54/56 Fall risk, in need of intervention  DGI 20/24   5TSTS 8.80 seconds   6 Minute Walk Test  1260 ft   Mini-BESTest 20/28   (Blank rows = not tested)   Clinical Test of Sensory Interaction for Balance    (CTSIB):  CONDITION TIME STRATEGY SWAY  Eyes open, firm surface 30 seconds ankle Minimal sway  Eyes closed, firm surface 30 seconds Ankle/hip Mild sway  Eyes open, foam surface 30 seconds ankle   Eyes closed, foam surface 30 seconds ankle Increased hip strategy, no significant LOB      TODAY'S TREATMENT: 04/21/2024   SUBJECTIVE STATEMENT:   Patient with late arrival to therapy session. Patient reports he is doing alright. Reports he is having some pain in his feet, states it may be related to his diabetes. Denies stumbles/falls. Pt states he feels he is progressing well with physical therapy and is looking forward to starting OT and SLP today.   Therapeutic Interventions:  Gait training 172ft, no AD,  with CGA for safety.  Dual-task challenge of turning head to visually scan and name letters/numbers on sticky notes on wall No imbalance, but pt did have greatest difficulty with verbalizing the numbers/letters requiring him to walk much slower to allow time to think of and state the letter/number  Dynamic balance interventions including:  Performed below with dual-tasks using Blaze Pods having patient call out the color of the pod prior to tapping it to add verbal challenge 3 Blaze Pods on table in front of patient to tap with R hand and 2 Blaze pods behind patient on ground to tap with his foot, on the same side as the target (used all colors of Blaze Pods for increased verbal challenge) Forward/backwards stepping over hurdle - 24 hits in 48min30sec CGA for safety with no instances of LOB Transitioned to standing on airex - 33 hits in 65min30sec Moved all 5 Blaze Pods around patient on the ground while standing on airex Performed foot taps vs taps using cane in RUE to work on coordination of R UE depending on color of pod (2 colors: red = cane, blue = foot) 25 hits in CGA/light min A for steadying  Noticed pt with increased balance instability with foot tapping and challenged when having to control cane to tap target with R hand. Pt having greatest difficulty naming red and yellow colors.   PATIENT EDUCATION:  Education details: Discussed results of testing, goals of PT, cut-off scores for fall risk measures, plan of care. Discussed use of toe tapping drill on porch steps at home to work on standing coordination and adequate clearing of toes with each step.  Person educated: Patient Education method: Explanation, Demonstration, and Handouts Education comprehension: verbalized understanding and returned demonstration   HOME EXERCISE PROGRAM: Access Code: [email or web address] URL: [email or web address] Date: 03/11/2024 Prepared by: Maryanne Finder  Exercises - Sit to Stand with  Arms Crossed  - 2-3 x daily - 5-7 x weekly - 3 sets - 10 reps - Standing March with Counter Support  - 2-3 x daily - 5-7 x weekly - 3 sets - 10 reps - Heel Raises with Counter Support  - 2-3 x daily - 5-7 x weekly - 3 sets - 10 reps   ASSESSMENT:  CLINICAL IMPRESSION:  Therapy session continued with prior POC focusing on higher level dynamic balance tasks with dual-task challenges and integrating verbal naming of items for language challenge. Patient enjoyed the [facility] challenge of naming colors during dynamic balance task and was engaged in trying to increase speed to hit more targets in allotted time. Patient continues to have remaining deficits in  BLE weakness, RUE/RLE coordination impairment, R upper and lower limb flexor spasticity of mild to moderate magnitude, gait changes, and intermittent postural control impairments with pt tending to loss balance backwards. Patient will benefit from continued skilled PT to address above impairments and improve overall function.  REHAB POTENTIAL: Fair    CLINICAL DECISION MAKING: Evolving/moderate complexity  EVALUATION COMPLEXITY: Moderate   GOALS: Goals reviewed with patient? Yes  SHORT TERM GOALS: Target date: 04/22/2024  Pt will be independent with HEP in order to improve strength and balance in order to decrease fall risk and improve function at home. Baseline: 5/21: initiated Goal status: INITIAL   LONG TERM GOALS: Target date: 06/03/2024   1.  Pt will improve BERG by at least 3 points in order to demonstrate clinically significant improvement in balance.   Baseline: 5/21: to be completed visit #2.   03/19/24: 54/56 Goal status: INITIAL  2.  Pt will improve ABC by at least 13% in order to demonstrate clinically significant improvement in balance confidence.      Baseline: 5/21: 68.8% Goal status: INITIAL  3. Pt will improve DGI by at least 3 points in order to demonstrate clinically significant improvement in balance and decreased  risk for falls.     Baseline: 5/21: 20/24  Goal status: INITIAL  4. Pt will increase by at least 73m (125ft) in order to demonstrate clinically significant improvement in cardiopulmonary endurance and community ambulation   Baseline: 5/21: to be completed visit #2.   03/19/24: 1260 ft.  Goal status: INITIAL  3. Pt will improve MiniBEST by at least 4 points in order to demonstrate clinically significant improvement in balance and decreased risk for falls.     Baseline: 6/12: 20/28 Goal status: INITIAL  PLAN: PT FREQUENCY: 1-2x/week  PT DURATION: 12 weeks  PLANNED INTERVENTIONS: Therapeutic exercises, Therapeutic activity, Neuromuscular re-education, Balance training, Gait training, Patient/Family education, Joint manipulation, Joint mobilization, Canalith repositioning, Aquatic Therapy, Dry Needling, Cognitive remediation, Electrical stimulation, Spinal manipulation, Spinal mobilization, Cryotherapy, Moist heat, Traction, Ultrasound, Ionotophoresis 4mg /ml Dexamethasone , and Manual therapy  PLAN FOR NEXT SESSION:  - dual-task training (adding verbal component when appropriate to address speech impairments) - high level dynamic balance, gait, and BLE strengthening - reactive balance   Kayman Snuffer, PT, DPT, NCS, CSRS Physical Therapist - [place]  [facility]  6:21 PM 04/21/24

## 2024-04-21 NOTE — Therapy (Unsigned)
 OUTPATIENT OCCUPATIONAL THERAPY NEURO EVALUATION  Patient Name: Brian Rios MRN: [id number] DOB:1981-10-04, 43 y.o., male Today's Date: 04/23/2024  PCP: Toribio Hoyle, PA REFERRING PROVIDER: Toribio Hoyle, PA  END OF SESSION:  OT End of Session - 04/23/24 1619     Visit Number 1    Number of Visits 24    Date for OT Re-Evaluation 07/14/24    OT Start Time 1400    OT Stop Time 1445    OT Time Calculation (min) 45 min    Activity Tolerance Patient tolerated treatment well    Behavior During Therapy [facility] for tasks assessed/performed         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   left cerebrum   Erectile dysfunction    Heart murmur    Hyperlipidemia    Schizophrenia (HCC)    Tobacco abuse    Past Surgical History:  Procedure Laterality Date   BOWEL RESECTION N/A 09/04/2022   Procedure: SMALL BOWEL RESECTION, open;  Surgeon: Jordis Laneta FALCON, MD;  Location: [facility] ORS;  Service: General;  Laterality: N/A;   IR CT HEAD LTD  03/21/2023   IR CT HEAD LTD  03/21/2023   IR PERCUTANEOUS ART THROMBECTOMY/INFUSION INTRACRANIAL INC DIAG ANGIO  03/21/2023   ORIF ORBITAL FRACTURE  1985   PARTIAL COLECTOMY Right 09/04/2022   Procedure: PARTIAL COLECTOMY;  Surgeon: Jordis Laneta FALCON, MD;  Location: [facility] ORS;  Service: General;  Laterality: Right;  Right colectimy   RADIOLOGY WITH ANESTHESIA N/A 03/21/2023   Procedure: RADIOLOGY WITH ANESTHESIA;  Surgeon: Radiologist, Medication, MD;  Location: [facility] OR;  Service: Radiology;  Laterality: N/A;   TEE WITHOUT CARDIOVERSION N/A 07/19/2020   Procedure: TRANSESOPHAGEAL ECHOCARDIOGRAM (TEE);  Surgeon: Hester Wolm PARAS, MD;  Location: [facility] ORS;  Service: Cardiovascular;  Laterality: N/A;   WRIST SURGERY Left 11/07/2013   REPAIR ULNAR ARTERY, NERVE AND TENDON   Patient Active Problem List   Diagnosis Date Noted   Speech and language  deficits 04/22/2023   Hemiparesis affecting right side as late effect of cerebrovascular accident (CVA) (HCC) 04/22/2023   Spasticity 04/22/2023   Chronic diarrhea 04/22/2023   Chronic schizophrenia (HCC) 04/01/2023   S/P right colectomy 09/04/2022   Dental decay 06/12/2022   Erectile dysfunction 07/26/2021   Hyperlipidemia LDL goal <70 03/28/2021   History of embolic stroke 07/18/2020   Drug abuse (HCC) 07/17/2020   Major depressive disorder, recurrent episode, severe (HCC) 02/20/2015   Alcohol use disorder, severe, dependence (HCC) 02/20/2015   Stimulant use disorder 02/20/2015   Tobacco use disorder 02/20/2015    ONSET DATE: 03/20/24  REFERRING DIAG: Muscle weakness (generalized)  Other lack of coordination  Hemiparesis affecting right side as late effect of cerebrovascular accident (HCC)  THERAPY DIAG:  Muscle weakness (generalized)  Other lack of coordination  Hemiparesis affecting right side as late effect of cerebrovascular accident [facility])  Rationale for Evaluation and Treatment: Rehabilitation  SUBJECTIVE:  SUBJECTIVE STATEMENT: Pt reports doing well today. Pt accompanied by: self  PERTINENT HISTORY: Per chart: Patient is a 43 year old right-handed male with PMH of alcohol/tobacco use, left basal ganglia embolic stroke in 2021 (no residual deficits), schizophrenia maintained on Abilify , Depakote , Lexapro  as well as Cogentin , hyperlipidemia, and 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 Presented 03/21/2023 to [facility] with acute onset of right-sided weakness facial droop and aphasia. Cranial CT scan worrisome for acute  infarct in the posterior left parietal lobe. CTA showed occlusion of 2 proximal left M2 branches and mild left M1 stenosis. Widely patent carotid arteries.  Patient transferred to [facility] for admission and underwent arteriogram with revascularization of occluded left MCA however it reoccluded. MRI of the brain showed interval increase in size of  acute/subacute nonhemorrhagic infarct involving the majority the left insular cortex and posterior left frontal operculum extending into the high posterior left parietal lobe. No acute hemorrhage or significant mass effect. Patient admitted to CIR on 03/27/23, discharged .   PRECAUTIONS: None  WEIGHT BEARING RESTRICTIONS: No  PAIN:  Are you having pain? Yes: NPRS scale: 7/10 in the wrist, >7/10 in bilat feet  Pain location: both feet, R wrist  Pain description: unable  Aggravating factors: walking a lot,  Relieving factors: nothing   FALLS: Has patient fallen in last 6 months? Yes. Number of falls Pt estimates 2 times   LIVING ENVIRONMENT: Lives with: lives with their family Lives in: Mobile home Stairs: Yes: External: 1 steps; on right going up and on left going up Has following equipment at home: None  PLOF: Family assisted with some ADLs d/t psychiatric hx   PATIENT GOALS: I hope I get some more use out of my hand.   OBJECTIVE:  Note: Objective measures were completed at Evaluation unless otherwise noted.  HAND DOMINANCE: Right  ADLs: Overall ADLs: intermittent assist from mother Transfers/ambulation related to ADLs: indep Eating: using L non-dominant hand with utensils; unable to cut food  Grooming: using L non-dominant hand UB Dressing: difficulty with clothing fasteners LB Dressing: mom helps with tying draw string pants  Toileting:  using L non-dominant hand Bathing: pt reports he can use his R hand a little, but it's difficulty  Tub Shower transfers: indep Equipment: none  IADLs:  Shopping: supv Light housekeeping: mother assists  Meal Prep: mother currently manages, but pt reports he cooked prior to CVA Community mobility: Pt relies on friends and family for driving, but reports he was able to drive prior to his CVA Medication management: mom currently managing; pt reports he was able to manage them before his stroke  Financial management: pt reports he has 1  bill (rent) and he pays his mom.  Handwriting: Difficulty grasping pen, scribbles for signature, but not at baseline.   POSTURE COMMENTS:  No Significant postural limitations  ACTIVITY TOLERANCE: Activity tolerance: Will continue to assess within functional contexts   UPPER EXTREMITY ROM:  BUEs WFL  UPPER EXTREMITY MMT:   R shoulder 4+/5, L 5/5  MMT Right eval Left eval  Shoulder flexion 4+ 5  Shoulder abduction 4+ 5  Shoulder adduction    Shoulder extension    Shoulder internal rotation    Shoulder external rotation    Middle trapezius    Lower trapezius    Elbow flexion 4+ 5  Elbow extension 4+ 5  Wrist flexion 4+ 5  Wrist extension 4+ 5  Wrist ulnar deviation    Wrist radial deviation    Wrist pronation    Wrist supination    (Blank rows = not tested)  HAND FUNCTION: Grip strength: Right: 26 lbs; Left: 106 lbs, Lateral pinch: Right: 15 lbs, Left: 20 lbs, and 3 point pinch: Right: 9 (fingers slipped) lbs, Left: 17 lbs  COORDINATION: Finger Nose Finger test: increased time/less precision with the RUE 9 Hole Peg test: Right: Placed 3 pegs in 2 min (stopped to limit frustration level) sec; Left: 24 sec  SENSATION: R  hand tingling/numbness dorsal/volar hand.    EDEMA: No visible swelling   MUSCLE TONE: RUE: Within functional limits  COGNITION: 0x4 Overall cognitive status: hx of schizophrenia, living with mother at baseline  VISION ASSESSMENT: WFL  PERCEPTION: Not tested  PRAXIS: Impaired: Motor planning, moderate-severe apraxia RUE  OBSERVATIONS:  Pt pleasant, cooperative, eager to improve use of his hand.                                                                                                      TREATMENT DATE: 04/21/24 Evaluation completed.   PATIENT EDUCATION: Education details: OT role, goals, poc Person educated: Patient Education method: Explanation and Verbal cues Education comprehension: verbalized understanding and needs further  education  HOME EXERCISE PROGRAM: Will initiate in upcoming sessions  GOALS: Goals reviewed with patient? Yes  SHORT TERM GOALS: Target date: 06/02/24  Pt will perform HEP with min A or better for increasing RUE strength and coordination for daily tasks. Baseline: Eval: Not yet initiated  Goal status: INITIAL  LONG TERM GOALS: Target date: 07/14/24  Pt will increase grip strength by 20 or more lbs to enable ability to hold and carry heavier ADL supplies without dropping. Baseline: Eval: R 26; L 106 lbs Goal status: INITIAL  2.  Pt will increase R lateral pinch by 5 or more lbs to enable improved ability to stabilize eating utensils in R dominant hand. Baseline: Eval: R 15 lbs, L 20 lbs  Goal status: INITIAL  3.  Pt will increase R hand FMC skills to enable indep with managing clothing fasteners.   Baseline: Eval: R: 9 hole placed 3 pegs in 2 min; L 24 sec (all 9 pegs); mother currently assists with clothing fasteners  Goal status: INITIAL  4.  Pt will increase RUE [facility]MC skills to allow use of the R dominant arm for bathing at least 50% of body.  Baseline: Eval: Pt reports he primarily uses L non-dominant arm d/t difficulty bathing with the R  Goal status: INITIAL  ASSESSMENT:  CLINICAL IMPRESSION: Patient is a 43 y.o. male who was seen today for occupational therapy evaluation for functional decline related to recent CVA.  Pt presents with primarily distal weakness in the R dominant arm, noting significant discrepancy in R hand strength and coordination measures as compared to L non-dominant arm.  When describing ADL performance, pt verbalizes that he is primarily using his L non-dominant hand d/t difficulty with the R.  Pt will benefit from skilled OT to increase RUE strength and coordination in order to work towards return to PLOF and to better engage the RUE into ADL/IADL tasks, reduce burden of care on caregivers, and improve QOL.  PERFORMANCE DEFICITS: in functional skills  including ADLs, IADLs, coordination, dexterity, proprioception, sensation, strength, pain, flexibility, Fine motor control, Gross motor control, mobility, balance, body mechanics, decreased knowledge of precautions, decreased knowledge of use of DME, vision, and UE functional use, cognitive skills including attention, memory, and temperament/personality, and psychosocial skills including coping strategies, environmental adaptation, habits, and routines and behaviors.   IMPAIRMENTS: are limiting patient from ADLs,  IADLs, education, leisure, and social participation.   CO-MORBIDITIES: has co-morbidities such as schizophrenia, L embolic CVA, drug abuse, depression that affects occupational performance. Patient will benefit from skilled OT to address above impairments and improve overall function.  MODIFICATION OR ASSISTANCE TO COMPLETE EVALUATION: Min-Moderate modification of tasks or assist with assess necessary to complete an evaluation.  OT OCCUPATIONAL PROFILE AND HISTORY: Detailed assessment: Review of records and additional review of physical, cognitive, psychosocial history related to current functional performance.  CLINICAL DECISION MAKING: Moderate - several treatment options, min-mod task modification necessary  REHAB POTENTIAL: Good  EVALUATION COMPLEXITY: Moderate    PLAN:  OT FREQUENCY: 2x/week  OT DURATION: 12 weeks  PLANNED INTERVENTIONS: 97168 OT Re-evaluation, 97535 self care/ADL training, 02889 therapeutic exercise, 97530 therapeutic activity, 97112 neuromuscular re-education, 97140 manual therapy, 97116 gait training, 02989 moist heat, 97010 cryotherapy, 97750 Physical Performance Testing, 02239 Orthotic Initial, 97763 Orthotic/Prosthetic subsequent, passive range of motion, balance training, functional mobility training, visual/perceptual remediation/compensation, psychosocial skills training, energy conservation, coping strategies training, patient/family education, and DME  and/or AE instructions  RECOMMENDED OTHER SERVICES: None at this time  CONSULTED AND AGREED WITH PLAN OF CARE: Patient  PLAN FOR NEXT SESSION: see above  Inocente Blazing, MS, OTR/L   Inocente MARLA Blazing, OT 04/23/2024, 4:21 PM

## 2024-04-21 NOTE — Therapy (Signed)
 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   left cerebrum   Erectile dysfunction    Heart murmur    Hyperlipidemia    Schizophrenia (HCC)    Tobacco abuse    Past Surgical History:  Procedure Laterality Date   BOWEL RESECTION N/A 09/04/2022   Procedure: SMALL BOWEL RESECTION, open;  Surgeon: Jordis Laneta FALCON, MD;  Location: [facility] ORS;  Service: General;  Laterality: N/A;   IR CT HEAD LTD  03/21/2023   IR CT HEAD LTD  03/21/2023   IR PERCUTANEOUS ART THROMBECTOMY/INFUSION INTRACRANIAL INC DIAG ANGIO  03/21/2023   ORIF ORBITAL FRACTURE  1985   PARTIAL COLECTOMY Right 09/04/2022   Procedure: PARTIAL COLECTOMY;  Surgeon: Jordis Laneta FALCON, MD;  Location: [facility] ORS;  Service: General;  Laterality: Right;  Right colectimy   RADIOLOGY WITH ANESTHESIA N/A 03/21/2023   Procedure: RADIOLOGY WITH ANESTHESIA;  Surgeon: Radiologist, Medication, MD;  Location: [facility] OR;  Service: Radiology;  Laterality: N/A;   TEE WITHOUT CARDIOVERSION N/A 07/19/2020   Procedure: TRANSESOPHAGEAL ECHOCARDIOGRAM (TEE);  Surgeon: Hester Wolm PARAS, MD;  Location: [facility] ORS;  Service: Cardiovascular;  Laterality: N/A;   WRIST SURGERY Left 11/07/2013   REPAIR ULNAR ARTERY, NERVE AND TENDON   Patient Active Problem List   Diagnosis Date Noted   Speech and language deficits 04/22/2023   Hemiparesis affecting right side as late  effect of cerebrovascular accident (CVA) (HCC) 04/22/2023   Spasticity 04/22/2023   Chronic diarrhea 04/22/2023   Chronic schizophrenia (HCC) 04/01/2023   S/P right colectomy 09/04/2022   Dental decay 06/12/2022   Erectile dysfunction 07/26/2021   Hyperlipidemia LDL goal <70 03/28/2021   History of embolic stroke 07/18/2020   Drug abuse (HCC) 07/17/2020   Major depressive disorder, recurrent episode, severe (HCC) 02/20/2015   Alcohol use disorder, severe, dependence (HCC) 02/20/2015   Stimulant use disorder 02/20/2015   Tobacco use disorder 02/20/2015    ONSET DATE: 03/22/2023; referral date 04/10/24  REFERRING DIAG:  P30.648 (ICD-10-CM) - Hemiparesis affecting right side as late effect of cerebrovascular accident (CVA) (HCC)  Z86.73 (ICD-10-CM) - History of embolic stroke  Q19.0 (ICD-10-CM) - Speech and language deficits    THERAPY DIAG:    Rationale for Evaluation and Treatment Rehabilitation  SUBJECTIVE:   SUBJECTIVE STATEMENT: Pt with decreased speech intelligibility, eager to participate Pt accompanied by: self  PERTINENT HISTORY and DIAGNOSTIC FINDINGS: Patient is a 43 year old right-handed male with PMH of alcohol/tobacco use, left basal ganglia embolic stroke in 2021 (no residual deficits), schizophrenia maintained on Abilify , Depakote , Lexapro  as well as Cogentin , hyperlipidemia, and 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 Presented 03/21/2023 to [facility] with acute onset of right-sided weakness facial droop and aphasia. Cranial CT scan worrisome for acute infarct in the posterior left parietal lobe.  CTA showed occlusion of 2 proximal left M2 branches and mild left M1 stenosis. Widely patent carotid arteries.  Patient transferred to [facility] for admission and underwent arteriogram with revascularization of occluded left MCA however it reoccluded. MRI of the brain showed interval increase in size of acute/subacute nonhemorrhagic infarct involving the majority the left  insular cortex and posterior left frontal operculum extending into the high posterior left parietal lobe. No acute hemorrhage or significant mass effect. Patient admitted to CIR on 03/27/23, discharged .  PAIN:  Are you having pain? No  FALLS: Has patient fallen in last 6 months?  No  LIVING ENVIRONMENT: Lives with: mother Lives in: Mobile home  PLOF:  Level of assistance: Independent with ADLs, Independent with IADLs    PATIENT GOALS    to get my speech right   OBJECTIVE:  COGNITION: Overall cognitive status: Difficulty to assess due to: Communication impairment and no family present Areas of impairment:  Attention: Impaired: Selective Functional deficits: some informal deficits in attention to task observed  AUDITORY COMPREHENSION: Overall auditory comprehension: Appears intact YES/NO questions: Appears intact Following directions: Appears intact Conversation: Simple Effective technique: repetition/stressing words   READING COMPREHENSION: N/T d/t pt reported vision deficits  EXPRESSION: verbal  VERBAL EXPRESSION: Overall verbal expression: Impaired: simple Level of generative/spontaneous verbalization: sentence Automatic speech: name: impaired and social response: impaired  Repetition: Impaired: phrase and sentence Naming: Confrontation: 76-100% and Divergent: 51-75% Pragmatics: Appears intact Interfering components: speech intelligibility Effective technique: open ended questions and sentence completion Non-verbal means of communication: N/A  WRITTEN EXPRESSION: Dominant hand: right  Written expression: Not tested  MOTOR SPEECH: Overall motor speech: impaired Level of impairment: Sentence Respiration: diaphragmatic/abdominal breathing Phonation: normal and low vocal intensity Resonance: WFL Articulation: Impaired: phrase and sentence Intelligibility: Intelligibility reduced Motor planning: Impaired: aware and inconsistent Motor speech errors: aware  and inconsistent Interfering components: N/A Effective technique: slow rate and increased vocal intensity   ORAL MOTOR EXAMINATION Facial : Symmetry impaired: Impaired right, Strength impaired: Impaired right Lingual: Symmetry Impaired: Impaired right, Strength Impaired: Impaired right Velum: WFL Mandible: WFL Cough: WFL Voice: WFL   STANDARDIZED ASSESSMENTS:   Western Aphasia Battery- Revised  Spontaneous Speech                           Information content               8/10                                            Fluency                                 6/10                                          Comprehension     Yes/No questions                 60/60  Auditory Word Recognition  60/60                                Sequential Commands       80/80                              Repetition                             85/100                                        Naming    Object Naming                     58/60                                           Word Fluency                        10/20                                            Sentence Completion          9/10                                             Responsive Speech              10/10                                         Aphasia Quotient                  82.4/100         Pt's severity rating was mild as indicated by an Aphasia Quotient of 82.4 (0-25=very severe, 26-50=severe, 51-75=moderate, 76 and above is mild). Pt's presentation is most consistent with anomic subtype.    PATIENT REPORTED OUTCOME MEASURES (PROM):  The Communication Effectiveness Survey is a patient-reported outcome measure in which the patient rates their own effectiveness in different communication situations. A higher score indicates greater effectiveness.   Pt's self-rating was 22/32.   Having a conversation with a family member or friends at home. 3 Participating in conversation with  strangers in a quiet place. 3 Conversing with a familiar person over the telephone. 4- Very Effective Conversing with a stranger over the telephone. 4- Very Effective Being part of a conversation in a noisy environment (social gathering). 4- Very Effective Speaking to a friend when you are emotionally upset or you are angry. 3 Having a conversation while traveling in a car. 4- Very Effective Having a conversation with someone at a distance (across a room). 1- Not at all effective    TODAY'S TREATMENT:  Pt educated at length re: role of SLP, changes to communication following stroke, aphasia, apraxia, and dysarthria, and SLP POC.    PATIENT EDUCATION: Education details: as above Person educated: Patient Education method: Explanation Education comprehension: needs further education  HOME EXERCISE PROGRAM:   N/A    GOALS:  Goals reviewed with patient? Yes  SHORT TERM GOALS: Target date: 10 sessions  With Min A, patient will describe visual scenes using 3 or more sentences at 75% accuracy. Baseline: Goal status: INITIAL  2.  With Moderate A, patient will complete a semantic feature analysis with at least 3 relevant features for 4/5 target words to improve word-finding skills.    Baseline:  Goal status: INITIAL  3.  Pt will utilize compensatory strategies for dysarthria with min cueing for 8/10 sentences to improve speech intelligibility.  Baseline:  Goal status: INITIAL  LONG TERM GOALS: Target date: 12 weeks  With min A, pt will utilize compensatory strategies for anomia at the conversation level.  Baseline:  Goal status: INITIAL  2.  With min A, pt will utilize compensatory strategies for dysarthria at the conversation level.   Baseline:  Goal status: INITIAL  3.  With Rare Min A, patient/family will demonstrate understanding of the following concepts: aphasia, spontaneous recovery, communication vs conversation, strengths/strategies to promote success, local  resources by answering multiple choice questions with 75% accuracy when provided supported conversation in order to increase patient's participation in medical care.    Baseline:  Goal status: INITIAL  ASSESSMENT:  CLINICAL IMPRESSION: Patient is a 43 y.o. right handed male who was seen today for a speech language evaluation. 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 Pt's expressive communication is c/b short grammatically incomplete utterances d/t word finding deficits. Pt's speech intelligibility is reduced by imprecise articulation, low vocal intensity and fast rate of speech resulting in ~ 70% speech intelligibility at the sentence level. Recommend course of ST targeting compensations and education for above mentioned deficits.  OBJECTIVE IMPAIRMENTS include awareness, expressive language, aphasia, apraxia, and dysarthria. These impairments are limiting patient from return to work, managing appointments, managing finances, ADLs/IADLs, and effectively communicating at home and in community. Factors affecting potential to achieve goals and functional outcome are co-morbidities, medical prognosis, previous level of function, severity of impairments, and insurance limits on number of visits. Patient will benefit from skilled SLP services to address above impairments and improve overall function.  REHAB POTENTIAL: Good  PLAN: SLP FREQUENCY: 1x/week  SLP DURATION: 12 weeks  PLANNED INTERVENTIONS: Language facilitation, Cueing hierachy, Internal/external aids, Oral motor exercises, Multimodal communication approach, SLP instruction and feedback, Compensatory strategies, and Patient/family education    Delon Bangs, M.S., CCC-SLP Speech-Language Pathologist [place] [facility] [phone] (ASCOM)

## 2024-04-28 ENCOUNTER — Ambulatory Visit: Payer: MEDICAID | Admitting: Physical Therapy

## 2024-04-28 ENCOUNTER — Ambulatory Visit: Payer: MEDICAID

## 2024-04-28 DIAGNOSIS — R482 Apraxia: Secondary | ICD-10-CM

## 2024-04-28 DIAGNOSIS — I69351 Hemiplegia and hemiparesis following cerebral infarction affecting right dominant side: Secondary | ICD-10-CM

## 2024-04-28 DIAGNOSIS — M6281 Muscle weakness (generalized): Secondary | ICD-10-CM

## 2024-04-28 DIAGNOSIS — R2681 Unsteadiness on feet: Secondary | ICD-10-CM

## 2024-04-28 DIAGNOSIS — R278 Other lack of coordination: Secondary | ICD-10-CM

## 2024-04-28 DIAGNOSIS — R471 Dysarthria and anarthria: Secondary | ICD-10-CM

## 2024-04-28 DIAGNOSIS — R4701 Aphasia: Secondary | ICD-10-CM

## 2024-04-28 DIAGNOSIS — I63512 Cerebral infarction due to unspecified occlusion or stenosis of left middle cerebral artery: Secondary | ICD-10-CM | POA: Diagnosis not present

## 2024-04-28 NOTE — Therapy (Signed)
 OUTPATIENT PHYSICAL THERAPY NEURO TREATMENT   Patient Name: Brian Rios MRN: [id number] DOB:[birth year], 43 y.o., male Today's Date: 04/02/2024  END OF SESSION:    PT End of Session - 04/28/24 1028     Visit Number 5    Number of Visits 25    Date for PT Re-Evaluation 06/03/24    PT Start Time 1028    PT Stop Time 1100    PT Time Calculation (min) 32 min    Activity Tolerance Patient tolerated treatment well;Other (comment)   patient with late arrival to therapy appointment   Behavior During Therapy [facility] for tasks assessed/performed            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   left cerebrum   Erectile dysfunction    Heart murmur    Hyperlipidemia    Schizophrenia (HCC)    Tobacco abuse    Past Surgical History:  Procedure Laterality Date   BOWEL RESECTION N/A 09/04/2022   Procedure: SMALL BOWEL RESECTION, open;  Surgeon: Jordis Laneta FALCON, MD;  Location: [facility] ORS;  Service: General;  Laterality: N/A;   IR CT HEAD LTD  03/21/2023   IR CT HEAD LTD  03/21/2023   IR PERCUTANEOUS ART THROMBECTOMY/INFUSION INTRACRANIAL INC DIAG ANGIO  03/21/2023   ORIF ORBITAL FRACTURE  1985   PARTIAL COLECTOMY Right 09/04/2022   Procedure: PARTIAL COLECTOMY;  Surgeon: Jordis Laneta FALCON, MD;  Location: [facility] OR[facility];  Service: General;  Laterality: Right;  Right colectimy   RADIOLOGY WITH ANESTHESIA N/A 03/21/2023   Procedure: RADIOLOGY WITH ANESTHESIA;  Surgeon: Radiologist, Medication, MD;  Location: [facility];  Service: Radiology;  Laterality: N/A;   TEE WITHOUT CARDIOVERSION N/A 07/19/2020   Procedure: TRANSESOPHAGEAL ECHOCARDIOGRAM (TEE);  Surgeon: Hester Wolm PARAS, MD;  Location: [facility] OR[facility];  Service: Cardiovascular;  Laterality: N/A;   WRIST SURGERY Left 11/07/2013   REPAIR ULNAR ARTERY, NERVE AND TENDON   Patient Active Problem List   Diagnosis Date Noted   Speech and language  deficits 04/22/2023   Hemiparesis affecting right side as late effect of cerebrovascular accident (CVA) (HCC) 04/22/2023   Spasticity 04/22/2023   Chronic diarrhea 04/22/2023   Chronic schizophrenia (HCC) 04/01/2023   S/P right colectomy 09/04/2022   Dental decay 06/12/2022   Erectile dysfunction 07/26/2021   Hyperlipidemia LDL goal <70 03/28/2021   History of embolic stroke 07/18/2020   Drug abuse (HCC) 07/17/2020   Major depressive disorder, recurrent episode, severe (HCC) 02/20/2015   Alcohol use disorder, severe, dependence (HCC) 02/20/2015   Stimulant use disorder 02/20/2015   Tobacco use disorder 02/20/2015    PCP: Toribio SQUIBB. [place], [place]  REFERRING PROVIDER: Toribio SQUIBB. [place], [place]  REFERRING DIAGNOSIS: [phone] (ICD-10-CM) - Hemiparesis affecting right side as late effect of cerebrovascular accident (CVA) (HCC)   THERAPY DIAG: Apraxia  Muscle weakness (generalized)  Other lack of coordination  Hemiparesis affecting right side as late effect of cerebrovascular accident (HCC)  Unsteadiness on feet   ONSET DATE: 03/21/23 (ED to hospital admission, CVA)  FOLLOW UP APPT WITH PROVIDER: Yes ; 03/11/24   RATIONALE FOR EVALUATION AND TREATMENT: Rehabilitation  Pertinent History  Daily subjective documented below in treatment session of notes.    43 y.o. year old male with Hx of L MCA stroke 6/5-6/19; pt has a past medical history of Alcohol abuse, Depression, Drug abuse (HCC), Embolic stroke  of left basal ganglia (HCC) (07/17/2020), Erectile dysfunction, Heart murmur, Hyperlipidemia, Schizophrenia (HCC), and Tobacco abuse.  Patient reports that he has been losing his balance while he's walking. Tends to lose balance backwards. Difficulty expressing self due to aphasia.   Pt currently referred for R hemiparesis after stroke. Pt also has referrals for speech and OT.   Pain: No Numbness/Tingling: Yes - R forearm and hand  Focal Weakness: No Recent changes in overall  health/medication: No Prior history of physical therapy for balance:  No Falls: Has patient fallen in last 6 months? 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, goats) Red flags (bowel/bladder changes, saddle paresthesia, personal history of cancer, h/o spinal tumors, h/o compression fx, h/o abdominal aneurysm, abdominal pain, chills/fever, night sweats, nausea, vomiting, unrelenting pain): Negative  Precautions: None  Weight Bearing Restrictions: No  Living Environment Lives with: lives with mother  Lives in: Mobile home Has following equipment at home: shower chair   Patient Goals: to reduce falls and to improve balance      OBJECTIVE:   Patient Surveys  ABC: 68.8% (unsure if patient fully understood meaning of questionnaire)  Cognition Patient is oriented to person, place, and time.  Recent memory is intact.  Remote memory is impaired.  Attention span and concentration are intact.  Expressive speech is impaired.      Gross Musculoskeletal Assessment Tremor: Clonus BLE Bulk: Normal Tone: Mild to moderate spasticity for R upper/lower extremity flexors   GAIT: Distance walked: 7'  Assistive device utilized: None Level of assistance: Complete Independence Comments: drifts L/R, decreased foot clearance bilaterally intermittently, rounded shoulders    LE MMT:  MMT (out of 5) Right 03/11/2024 Left 03/11/2024  Hip flexion 4 4  Hip extension    Hip abduction 4 4  Hip adduction 4 4  Hip internal rotation    Hip external rotation    Knee flexion 4+ 4+  Knee extension 4+ 4+  Ankle dorsiflexion 4+ 4+  Ankle plantarflexion    Ankle inversion    Ankle eversion    (* = pain; Blank rows = not tested)   Sensation Grossly intact to light touch bilateral LEs as determined by testing dermatomes  L2-S2. Proprioception, and hot/cold testing deferred on this date.   Reflexes R/L Knee Jerk (L3/4):deferred  Ankle Jerk (S1/2): deferred    Cranial Nerves  Deferred    Coordination/Cerebellar Finger to Nose: Slowed with RUE Heel to Shin: slightly impaired R LE Rapid alternating movements: impaired R LE Finger Opposition: unable on R UE, intact LUE Pronator Drift: Positive    Modified Ashworth Scale Biceps: R 3, L 1 Wrist flexors: R 0, L 0 Hamstrings: R 2, L 1 Gastrocnemius: R 3, L 3  0 No increase in tone 1 slight increase in tone giving a catch when slight increase in muscle tone, manifested by the limb was moved in flexion or extension. 1+ slight increase in muscle tone, manifested by a catch followed by minimal resistance throughout (ROM ) 2 more marked increase in tone but more marked increased in muscle tone through most limb easily flexed 3 considerable increase in tone, passive movement difficult 4 limb rigid in flexion or extension     FUNCTIONAL OUTCOME MEASURES   Results Comments  BERG 54/56 Fall risk, in need of intervention  DGI 20/24   5TSTS 8.80 seconds  6 Minute Walk Test 1260 ft   Mini-BESTest 20/28   (Blank rows = not tested)   Clinical Test of Sensory Interaction for Balance    (CTSIB):  CONDITION TIME STRATEGY SWAY  Eyes open, firm surface 30 seconds ankle Minimal sway  Eyes closed, firm surface 30 seconds Ankle/hip Mild sway  Eyes open, foam surface 30 seconds ankle   Eyes closed, foam surface 30 seconds ankle Increased hip strategy, no significant LOB      TODAY'S TREATMENT: 04/28/2024  SUBJECTIVE STATEMENT:   Patient with late arrival to therapy session. Patient reports he is doing alright. Reports he is having some pain in his feet, states it may be related to his diabetes. Denies stumbles/falls.  Therapeutic Interventions:  Gait training 576ft, no AD, with CGA for safety.  Dual-task challenge of turning head to visually scan and  name letters/numbers on sticky notes on wall No imbalance, but pt did have difficulty with verbalizing the numbers/letters requiring him to walk much slower to allow time to think of and state the letter/number; however, this was improved compared to last session Progressed to backwards gait training for ~154ft and then added the same dual-task of naming letters/numbers No significant increased difficulty naming them during backwards gait  Dynamic balance interventions including:  Standing 1/2 tandem on airex pad while spelling words with magnetic letters using R UE to target hand dexterity NMR Performed at balance bar for safety to allow UE support if/when needed but used minimally Requires CGA/SBA for balance safety More difficulty with R LE forward  Pt has to slide the letters on the board rather than picking them up off the board due to impaired dexterity  Progressed to same task, but standing with normal BOS on BOSU ball (blue side down)  Cued pt to try and attempt to pull magnets off wall to move them and pt able to be successful with some of the larger letters Requires CGA for safety and only 2x min A and use of UE support on balance bar to regain balance     PATIENT EDUCATION:  Education details: Discussed results of testing, goals of PT, cut-off scores for fall risk measures, plan of care. Discussed use of toe tapping drill on porch steps at home to work on standing coordination and adequate clearing of toes with each step.  Person educated: Patient Education method: Explanation, Demonstration, and Handouts Education comprehension: verbalized understanding and returned demonstration   HOME EXERCISE PROGRAM: Access Code: [email or web address] URL: [email or web address] Date: 03/11/2024 Prepared by: Maryanne Finder  Exercises - Sit to Stand with Arms Crossed  - 2-3 x daily - 5-7 x weekly - 3 sets - 10 reps - Standing March with Counter Support  - 2-3 x daily - 5-7 x weekly - 3  sets - 10 reps - Heel Raises with Counter Support  - 2-3 x daily - 5-7 x weekly - 3 sets - 10 reps   ASSESSMENT:  CLINICAL IMPRESSION:  Therapy session continued to focus on balance with dual-task challenges integrating verbal naming of items for language challenge and using R UE for NMR. Patient again with late arrival to therapy session, resulting in decreased treatment time. Therapy session specifically focused on standing on compliant surfaces with varying BOS, while performing R UE dual-task NMR challenge of spelling words of gradual increased difficulty. Noticed pt with significant challenge coordinating R hand movements to grasp the small letters and move them as desired. Patient demos minor balance impairment with greatest difficulty having R LE  forward during tandem on airex and standing on BOSU ball. Patient continues to have remaining deficits in mild R hemiparesis, RUE/RLE coordination impairment, gait impairments, and intermittent postural control impairments with pt tending to loss balance backwards. Patient will benefit from continued skilled PT to address above impairments and improve overall function.  REHAB POTENTIAL: Fair    CLINICAL DECISION MAKING: Evolving/moderate complexity  EVALUATION COMPLEXITY: Moderate   GOALS: Goals reviewed with patient? Yes  SHORT TERM GOALS: Target date: 04/22/2024  Pt will be independent with HEP in order to improve strength and balance in order to decrease fall risk and improve function at home. Baseline: 5/21: initiated Goal status: INITIAL   LONG TERM GOALS: Target date: 06/03/2024   1.  Pt will improve BERG by at least 3 points in order to demonstrate clinically significant improvement in balance.   Baseline: 5/21: to be completed visit #2.   03/19/24: 54/56 Goal status: INITIAL  2.  Pt will improve ABC by at least 13% in order to demonstrate clinically significant improvement in balance confidence.      Baseline: 5/21: 68.8% Goal  status: INITIAL  3. Pt will improve DGI by at least 3 points in order to demonstrate clinically significant improvement in balance and decreased risk for falls.     Baseline: 5/21: 20/24  Goal status: INITIAL  4. Pt will increase by at least 65m (138ft) in order to demonstrate clinically significant improvement in cardiopulmonary endurance and community ambulation   Baseline: 5/21: to be completed visit #2.   03/19/24: 1260 ft.  Goal status: INITIAL  3. Pt will improve MiniBEST by at least 4 points in order to demonstrate clinically significant improvement in balance and decreased risk for falls.     Baseline: 6/12: 20/28 Goal status: INITIAL  PLAN: PT FREQUENCY: 1-2x/week  PT DURATION: 12 weeks  PLANNED INTERVENTIONS: Therapeutic exercises, Therapeutic activity, Neuromuscular re-education, Balance training, Gait training, Patient/Family education, Joint manipulation, Joint mobilization, Canalith repositioning, Aquatic Therapy, Dry Needling, Cognitive remediation, Electrical stimulation, Spinal manipulation, Spinal mobilization, Cryotherapy, Moist heat, Traction, Ultrasound, Ionotophoresis 4mg /ml Dexamethasone , and Manual therapy  PLAN FOR NEXT SESSION:  - dual-task training (adding verbal component when appropriate to address speech impairments including difficulty with word finding, dysarthria, and apraxia of speech) - high level dynamic balance, gait, and BLE strengthening - reactive balance - integrate R UE into tasks when able for NMR   Connell Kiss, PT, DPT, NCS, CSRS Physical Therapist - [facility]  11:05 AM 04/28/24

## 2024-04-28 NOTE — Therapy (Addendum)
 OUTPATIENT SPEECH LANGUAGE PATHOLOGY APHASIA TREATMENT   Patient Name: Brian Rios MRN: [id number] DOB:1981-09-03, 43 y.o., male Today's Date: 04/28/2024  PCP: Toribio Hoyle, [place] REFERRING PROVIDER: Arthea Farrow, MD   End of Session - 04/28/24 1056     Visit Number 2    Number of Visits 12    Date for SLP Re-Evaluation 07/14/24    Authorization Type [employer]    Progress Note Due on Visit 10    SLP Start Time 1100    SLP Stop Time  1130   SLP Time Calculation (min) 30 min    Activity Tolerance Patient tolerated treatment well; however, requested to terminate session early          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   left cerebrum   Erectile dysfunction    Heart murmur    Hyperlipidemia    Schizophrenia (HCC)    Tobacco abuse    Past Surgical History:  Procedure Laterality Date   BOWEL RESECTION N/A 09/04/2022   Procedure: SMALL BOWEL RESECTION, open;  Surgeon: Jordis Laneta FALCON, MD;  Location: [facility] ORS;  Service: General;  Laterality: N/A;   IR CT HEAD LTD  03/21/2023   IR CT HEAD LTD  03/21/2023   IR PERCUTANEOUS ART THROMBECTOMY/INFUSION INTRACRANIAL INC DIAG ANGIO  03/21/2023   ORIF ORBITAL FRACTURE  1985   PARTIAL COLECTOMY Right 09/04/2022   Procedure: PARTIAL COLECTOMY;  Surgeon: Jordis Laneta FALCON, MD;  Location: [facility] ORS;  Service: General;  Laterality: Right;  Right colectimy   RADIOLOGY WITH ANESTHESIA N/A 03/21/2023   Procedure: RADIOLOGY WITH ANESTHESIA;  Surgeon: Radiologist, Medication, MD;  Location: [facility] OR;  Service: Radiology;  Laterality: N/A;   TEE WITHOUT CARDIOVERSION N/A 07/19/2020   Procedure: TRANSESOPHAGEAL ECHOCARDIOGRAM (TEE);  Surgeon: Hester Wolm PARAS, MD;  Location: [facility] ORS;  Service: Cardiovascular;  Laterality: N/A;   WRIST SURGERY Left 11/07/2013   REPAIR ULNAR ARTERY, NERVE AND TENDON   Patient Active  Problem List   Diagnosis Date Noted   Speech and language deficits 04/22/2023   Hemiparesis affecting right side as late effect of cerebrovascular accident (CVA) (HCC) 04/22/2023   Spasticity 04/22/2023   Chronic diarrhea 04/22/2023   Chronic schizophrenia (HCC) 04/01/2023   S/P right colectomy 09/04/2022   Dental decay 06/12/2022   Erectile dysfunction 07/26/2021   Hyperlipidemia LDL goal <70 03/28/2021   History of embolic stroke 07/18/2020   Drug abuse (HCC) 07/17/2020   Major depressive disorder, recurrent episode, severe (HCC) 02/20/2015   Alcohol use disorder, severe, dependence (HCC) 02/20/2015   Stimulant use disorder 02/20/2015   Tobacco use disorder 02/20/2015    ONSET DATE: 03/22/2023; referral date 04/10/24  REFERRING DIAG:  P30.648 (ICD-10-CM) - Hemiparesis affecting right side as late effect of cerebrovascular accident (CVA) (HCC)  Z86.73 (ICD-10-CM) - History of embolic stroke  Q19.0 (ICD-10-CM) - Speech and language deficits    THERAPY DIAG: aphasia, apraxia, dysarthria   Rationale for Evaluation and Treatment Rehabilitation  SUBJECTIVE:   SUBJECTIVE STATEMENT: Pt with decreased speech intelligibility, eager to participate Pt accompanied by: self  PERTINENT HISTORY and DIAGNOSTIC FINDINGS: Patient is a 43 year old right-handed male with PMH of alcohol/tobacco use, left basal ganglia embolic stroke in 2021 (no residual deficits), schizophrenia maintained on Abilify , Depakote , Lexapro  as well as Cogentin , hyperlipidemia, and 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 Presented 03/21/2023  to [facility] with acute onset of right-sided weakness facial droop and aphasia. Cranial CT scan worrisome for acute infarct in the posterior left parietal lobe. CTA showed occlusion of 2 proximal left M2 branches and mild left M1 stenosis. Widely patent carotid arteries.  Patient transferred to [facility] for admission and underwent arteriogram with revascularization of occluded left MCA  however it reoccluded. MRI of the brain showed interval increase in size of acute/subacute nonhemorrhagic infarct involving the majority the left insular cortex and posterior left frontal operculum extending into the high posterior left parietal lobe. No acute hemorrhage or significant mass effect. Patient admitted to [facility] on 03/27/23, discharged .  PAIN:  Are you having pain? No  FALLS: Has patient fallen in last 6 months?  No  LIVING ENVIRONMENT: Lives with: mother Lives in: Mobile home  PLOF:  Level of assistance: Independent with ADLs, Independent with IADLs    PATIENT GOALS    to get my speech right   OBJECTIVE:  TODAY'S TREATMENT:  Introduced semantic features analysis for wordfinding. Pt required mod/max cues to utilize during structured tasks. Introduced compensations for dysarthria - limited carry-over noted.   Pt seemed restless and distracted throughout session, session terminated early at pt request.   PATIENT EDUCATION: Education details: as above Person educated: Patient Education method: Explanation Education comprehension: needs further education  HOME EXERCISE PROGRAM:   N/A    GOALS:  Goals reviewed with patient? Yes  SHORT TERM GOALS: Target date: 10 sessions  With Min A, patient will describe visual scenes using 3 or more sentences at 75% accuracy. Baseline: Goal status: INITIAL  2.  With Moderate A, patient will complete a semantic feature analysis with at least 3 relevant features for 4/5 target words to improve word-finding skills.    Baseline:  Goal status: INITIAL  3.  Pt will utilize compensatory strategies for dysarthria with min cueing for 8/10 sentences to improve speech intelligibility.  Baseline:  Goal status: INITIAL  LONG TERM GOALS: Target date: 12 weeks  With min A, pt will utilize compensatory strategies for anomia at the conversation level.  Baseline:  Goal status: INITIAL  2.  With min A, pt will utilize compensatory  strategies for dysarthria at the conversation level.   Baseline:  Goal status: INITIAL  3.  With Rare Min A, patient/family will demonstrate understanding of the following concepts: aphasia, spontaneous recovery, communication vs conversation, strengths/strategies to promote success, local resources by answering multiple choice questions with 75% accuracy when provided supported conversation in order to increase patient's participation in medical care.    Baseline:  Goal status: INITIAL  ASSESSMENT:  CLINICAL IMPRESSION: Patient is a 43 y.o. right handed male who was seen today for a speech language tx. 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 Pt's expressive communication is c/b short grammatically incomplete utterances d/t word finding deficits. Pt's speech intelligibility is reduced by imprecise articulation, low vocal intensity and fast rate of speech resulting in ~ 70% speech intelligibility at the sentence level. See details of tx above. Recommend course of ST targeting compensations and education for above mentioned deficits.  OBJECTIVE IMPAIRMENTS include awareness, expressive language, aphasia, apraxia, and dysarthria. These impairments are limiting patient from return to work, managing appointments, managing finances, ADLs/IADLs, and effectively communicating at home and in community. Factors affecting potential to achieve goals and functional outcome are co-morbidities, medical prognosis, previous level of function, severity of impairments, and insurance limits on  number of visits. Patient will benefit from skilled SLP services to address above impairments and improve overall function.  REHAB POTENTIAL: Good  PLAN: SLP FREQUENCY: 1x/week  SLP DURATION: 12 weeks  PLANNED INTERVENTIONS: Language facilitation, Cueing hierachy, Internal/external aids, Oral motor exercises, Multimodal communication approach, SLP  instruction and feedback, Compensatory strategies, and Patient/family education    Delon Bangs, M.S., CCC-SLP Speech-Language Pathologist [place] [facility] [phone] ([facility]COM)

## 2024-05-05 ENCOUNTER — Ambulatory Visit: Payer: MEDICAID | Admitting: Physical Therapy

## 2024-05-05 ENCOUNTER — Ambulatory Visit: Payer: MEDICAID | Admitting: Occupational Therapy

## 2024-05-05 ENCOUNTER — Telehealth: Payer: Self-pay | Admitting: Physical Therapy

## 2024-05-05 DIAGNOSIS — M6281 Muscle weakness (generalized): Secondary | ICD-10-CM

## 2024-05-05 DIAGNOSIS — I69351 Hemiplegia and hemiparesis following cerebral infarction affecting right dominant side: Secondary | ICD-10-CM

## 2024-05-05 DIAGNOSIS — R278 Other lack of coordination: Secondary | ICD-10-CM

## 2024-05-05 DIAGNOSIS — I63512 Cerebral infarction due to unspecified occlusion or stenosis of left middle cerebral artery: Secondary | ICD-10-CM | POA: Diagnosis not present

## 2024-05-05 NOTE — Therapy (Cosign Needed)
 OUTPATIENT OCCUPATIONAL THERAPY NEURO TREATMENT  Patient Name: Brian Rios MRN: [id number] DOB:Jun 22, 1981, 43 y.o., male Today's Date: 05/05/2024  PCP: Toribio Hoyle, PA REFERRING PROVIDER: Toribio Hoyle, PA  END OF SESSION:  OT End of Session - 05/05/24 1708     Visit Number 2    Number of Visits 24    Date for OT Re-Evaluation 07/14/24    OT Start Time 1015    OT Stop Time 1103    OT Time Calculation (min) 48 min    Activity Tolerance Patient tolerated treatment well    Behavior During Therapy [facility] for tasks assessed/performed          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   left cerebrum   Erectile dysfunction    Heart murmur    Hyperlipidemia    Schizophrenia (HCC)    Tobacco abuse    Past Surgical History:  Procedure Laterality Date   BOWEL RESECTION N/A 09/04/2022   Procedure: SMALL BOWEL RESECTION, open;  Surgeon: Jordis Laneta FALCON, MD;  Location: [facility] ORS;  Service: General;  Laterality: N/A;   IR CT HEAD LTD  03/21/2023   IR CT HEAD LTD  03/21/2023   IR PERCUTANEOUS ART THROMBECTOMY/INFUSION INTRACRANIAL INC DIAG ANGIO  03/21/2023   ORIF ORBITAL FRACTURE  1985   PARTIAL COLECTOMY Right 09/04/2022   Procedure: PARTIAL COLECTOMY;  Surgeon: Jordis Laneta FALCON, MD;  Location: [facility] ORS;  Service: General;  Laterality: Right;  Right colectimy   RADIOLOGY WITH ANESTHESIA N/A 03/21/2023   Procedure: RADIOLOGY WITH ANESTHESIA;  Surgeon: Radiologist, Medication, MD;  Location: [facility];  Service: Radiology;  Laterality: N/A;   TEE WITHOUT CARDIOVERSION N/A 07/19/2020   Procedure: TRANSESOPHAGEAL ECHOCARDIOGRAM (TEE);  Surgeon: Hester Wolm PARAS, MD;  Location: [facility] ORS;  Service: Cardiovascular;  Laterality: N/A;   WRIST SURGERY Left 11/07/2013   REPAIR ULNAR ARTERY, NERVE AND TENDON   Patient Active Problem List   Diagnosis Date Noted   Speech and language  deficits 04/22/2023   Hemiparesis affecting right side as late effect of cerebrovascular accident (CVA) (HCC) 04/22/2023   Spasticity 04/22/2023   Chronic diarrhea 04/22/2023   Chronic schizophrenia (HCC) 04/01/2023   S/P right colectomy 09/04/2022   Dental decay 06/12/2022   Erectile dysfunction 07/26/2021   Hyperlipidemia LDL goal <70 03/28/2021   History of embolic stroke 07/18/2020   Drug abuse (HCC) 07/17/2020   Major depressive disorder, recurrent episode, severe (HCC) 02/20/2015   Alcohol use disorder, severe, dependence (HCC) 02/20/2015   Stimulant use disorder 02/20/2015   Tobacco use disorder 02/20/2015    ONSET DATE: 03/20/24  REFERRING DIAG: Muscle weakness (generalized)  Other lack of coordination  Hemiparesis affecting right side as late effect of cerebrovascular accident (HCC)  THERAPY DIAG:  Other lack of coordination  Muscle weakness (generalized)  Hemiparesis affecting right side as late effect of cerebrovascular accident [facility])  Rationale for Evaluation and Treatment: Rehabilitation  SUBJECTIVE:  SUBJECTIVE STATEMENT: Pt. Requested step by step instructions during treatment session today during thumb opposition tasks. Pt accompanied by: self  PERTINENT HISTORY: Per chart: Patient is a 43 year old right-handed male with PMH of alcohol/tobacco use, left basal ganglia embolic stroke in 2021 (no residual deficits), schizophrenia maintained on Abilify , Depakote , Lexapro  as well as Cogentin , hyperlipidemia, and 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 Presented 03/21/2023 to [facility] with acute onset of right-sided weakness  facial droop and aphasia. Cranial CT scan worrisome for acute infarct in the posterior left parietal lobe. CTA showed occlusion of 2 proximal left M2 branches and mild left M1 stenosis. Widely patent carotid arteries.  Patient transferred to [facility] for admission and underwent arteriogram with revascularization of occluded left MCA however it  reoccluded. MRI of the brain showed interval increase in size of acute/subacute nonhemorrhagic infarct involving the majority the left insular cortex and posterior left frontal operculum extending into the high posterior left parietal lobe. No acute hemorrhage or significant mass effect. Patient admitted to [facility] on 03/27/23, discharged .   PRECAUTIONS: None  WEIGHT BEARING RESTRICTIONS: No  PAIN:  Are you having pain? Yes: NPRS scale: 7/10 in the wrist, >7/10 in bilat feet  Pain location: both feet, R wrist  Pain description: unable  Aggravating factors: walking a lot,  Relieving factors: nothing   FALLS: Has patient fallen in last 6 months? Yes. Number of falls Pt estimates 2 times   LIVING ENVIRONMENT: Lives with: lives with their family Lives in: Mobile home Stairs: Yes: External: 1 steps; on right going up and on left going up Has following equipment at home: None  PLOF: Family assisted with some ADLs d/t psychiatric hx   PATIENT GOALS: I hope I get some more use out of my hand.   OBJECTIVE:  Note: Objective measures were completed at Evaluation unless otherwise noted.  HAND DOMINANCE: Right  ADLs: Overall ADLs: intermittent assist from mother Transfers/ambulation related to ADLs: indep Eating: using L non-dominant hand with utensils; unable to cut food  Grooming: using L non-dominant hand UB Dressing: difficulty with clothing fasteners LB Dressing: mom helps with tying draw string pants  Toileting:  using L non-dominant hand Bathing: pt reports he can use his R hand a little, but it's difficulty  Tub Shower transfers: indep Equipment: none  IADLs:  Shopping: supv Light housekeeping: mother assists  Meal Prep: mother currently manages, but pt reports he cooked prior to CVA Community mobility: Pt relies on friends and family for driving, but reports he was able to drive prior to his CVA Medication management: mom currently managing; pt reports he was able to manage  them before his stroke  Financial management: pt reports he has 1 bill (rent) and he pays his mom.  Handwriting: Difficulty grasping pen, scribbles for signature, but not at baseline.   POSTURE COMMENTS:  No Significant postural limitations  ACTIVITY TOLERANCE: Activity tolerance: Will continue to assess within functional contexts   UPPER EXTREMITY ROM:  BUEs WFL  UPPER EXTREMITY MMT:   R shoulder 4+/5, L 5/5  MMT Right eval Left eval  Shoulder flexion 4+ 5  Shoulder abduction 4+ 5  Shoulder adduction    Shoulder extension    Shoulder internal rotation    Shoulder external rotation    Middle trapezius    Lower trapezius    Elbow flexion 4+ 5  Elbow extension 4+ 5  Wrist flexion 4+ 5  Wrist extension 4+ 5  Wrist ulnar deviation    Wrist radial deviation    Wrist pronation    Wrist supination    (Blank rows = not tested)  HAND FUNCTION: Grip strength: Right: 26 lbs; Left: 106 lbs, Lateral pinch: Right: 15 lbs, Left: 20 lbs, and 3 point pinch: Right: 9 (fingers slipped) lbs, Left: 17 lbs  COORDINATION: Finger Nose Finger test: increased time/less precision with the RUE 9 Hole Peg test: Right: Placed 3 pegs in 2 min (stopped to  limit frustration level) sec; Left: 24 sec  SENSATION: R hand tingling/numbness dorsal/volar hand.    EDEMA: No visible swelling   MUSCLE TONE: RUE: Within functional limits  COGNITION: 0x4 Overall cognitive status: hx of schizophrenia, living with mother at baseline  VISION ASSESSMENT: WFL  PERCEPTION: Not tested  PRAXIS: Impaired: Motor planning, moderate-severe apraxia RUE  OBSERVATIONS:  Pt pleasant, cooperative, eager to improve use of his hand.                                                                                                      TREATMENT DATE: 05/05/24  Therapeutic Ex.:  -Pt. Performed gross gripper exercises in pronation and in neutral positions.   Therapeutic Activities:  -Pt. Performed 2 trials of RUE [facility]  skills manipulating Jumbo pegs from container, and stored 1 peg at a time to place into pegboard, in preparation for removing jumbo pegs from pegboard with gross gripper.  -To further challenge the task, pt. Placed pegs into pegboard at a vertical position to encourage wrist extension. -Pt. Removed pegs from container while crossing midline, using his RUE to encourage functional reaching.   -Pt. Performed 2 trials of RUE Progressive gross grip strengthening with 11.2# of grip strength resistive force while removing pegs from pegboard, to place into container in multiple planes.   -Facilitated FMC grasp for 1 resistive cubes alternating thumb opposition to the tip of the 2nd/3rd digits while the board is placed at a vertical angle to encourage wrist extension. Emphasis was placed on islolating thumb, 2nd/3rd digit extension to press them into place.     PATIENT EDUCATION: Education details: OT role, goals, poc Person educated: Patient Education method: Explanation and Verbal cues Education comprehension: verbalized understanding and needs further education  HOME EXERCISE PROGRAM: Will initiate in upcoming sessions  GOALS: Goals reviewed with patient? Yes  SHORT TERM GOALS: Target date: 06/02/24  Pt will perform HEP with min A or better for increasing RUE strength and coordination for daily tasks. Baseline: Eval: Not yet initiated  Goal status: INITIAL  LONG TERM GOALS: Target date: 07/14/24  Pt will increase grip strength by 20 or more lbs to enable ability to hold and carry heavier ADL supplies without dropping. Baseline: Eval: R 26; L 106 lbs Goal status: INITIAL  2.  Pt will increase R lateral pinch by 5 or more lbs to enable improved ability to stabilize eating utensils in R dominant hand. Baseline: Eval: R 15 lbs, L 20 lbs  Goal status: INITIAL  3.  Pt will increase R hand FMC skills to enable indep with managing clothing fasteners.   Baseline: Eval: R: 9 hole placed 3 pegs  in 2 min; L 24 sec (all 9 pegs); mother currently assists with clothing fasteners  Goal status: INITIAL  4.  Pt will increase RUE GMC skills to allow use of the R dominant arm for bathing at least 50% of body.  Baseline: Eval: Pt reports he primarily uses L non-dominant arm d/t difficulty bathing with the R  Goal status: INITIAL  ASSESSMENT:  CLINICAL IMPRESSION: Pt. Tolerated  treatment session well today. Pt. Tolerated 2 trials of gross gripping exercises, however, has difficulty sustaining gross grip with elbow extension during functional reaching tasks. Pt. Has difficulty with performing thumb opposition, and isolating 2nd digit during [facility] tasks. Pt. Presents with fatigue in the RUE during gross gripping tasks. Pt. Required Mod vc for proper form and instructions during gripping and [facility] tasks. Pt. Requested step by step instructions during thumb opposition and [facility] tasks. Pt. Has difficulty using pink theraputty during grip/pinch exercises, yellow theraputty HEP to be provided in future treatment sessions. Pt will benefit from skilled OT to increase RUE strength and coordination in order to work towards return to PLOF and to better engage the RUE into ADL/IADL tasks, reduce burden of care on caregivers, and improve QOL.  PERFORMANCE DEFICITS: in functional skills including ADLs, IADLs, coordination, dexterity, proprioception, sensation, strength, pain, flexibility, Fine motor control, Gross motor control, mobility, balance, body mechanics, decreased knowledge of precautions, decreased knowledge of use of DME, vision, and UE functional use, cognitive skills including attention, memory, and temperament/personality, and psychosocial skills including coping strategies, environmental adaptation, habits, and routines and behaviors.   IMPAIRMENTS: are limiting patient from ADLs, IADLs, education, leisure, and social participation.   CO-MORBIDITIES: has co-morbidities such as schizophrenia, L embolic CVA,  drug abuse, depression that affects occupational performance. Patient will benefit from skilled OT to address above impairments and improve overall function.  MODIFICATION OR ASSISTANCE TO COMPLETE EVALUATION: Min-Moderate modification of tasks or assist with assess necessary to complete an evaluation.  OT OCCUPATIONAL PROFILE AND HISTORY: Detailed assessment: Review of records and additional review of physical, cognitive, psychosocial history related to current functional performance.  CLINICAL DECISION MAKING: Moderate - several treatment options, min-mod task modification necessary  REHAB POTENTIAL: Good  EVALUATION COMPLEXITY: Moderate    PLAN:  OT FREQUENCY: 2x/week  OT DURATION: 12 weeks  PLANNED INTERVENTIONS: 97168 OT Re-evaluation, 97535 self care/ADL training, 02889 therapeutic exercise, 97530 therapeutic activity, 97112 neuromuscular re-education, 97140 manual therapy, 97116 gait training, 02989 moist heat, 97010 cryotherapy, 97750 Physical Performance Testing, 02239 Orthotic Initial, 97763 Orthotic/Prosthetic subsequent, passive range of motion, balance training, functional mobility training, visual/perceptual remediation/compensation, psychosocial skills training, energy conservation, coping strategies training, patient/family education, and DME and/or AE instructions  RECOMMENDED OTHER SERVICES: None at this time  CONSULTED AND AGREED WITH PLAN OF CARE: Patient  PLAN FOR NEXT SESSION: see above    Damien Nap, Student-OT 05/05/2024, 5:15 PM

## 2024-05-05 NOTE — Therapy (Incomplete)
 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   left cerebrum   Erectile dysfunction    Heart murmur    Hyperlipidemia    Schizophrenia (HCC)    Tobacco abuse    Past Surgical History:  Procedure Laterality Date   BOWEL RESECTION N/A 09/04/2022   Procedure: SMALL BOWEL RESECTION, open;  Surgeon: Jordis Laneta FALCON, MD;  Location: [facility];  Service: General;  Laterality: N/A;   IR CT HEAD LTD  03/21/2023   IR CT HEAD LTD  03/21/2023   IR PERCUTANEOUS ART THROMBECTOMY/INFUSION INTRACRANIAL INC DIAG ANGIO  03/21/2023   ORIF ORBITAL FRACTURE  1985   PARTIAL COLECTOMY Right 09/04/2022   Procedure: PARTIAL COLECTOMY;  Surgeon: Jordis Laneta FALCON, MD;  Location: [facility];  Service: General;  Laterality: Right;  Right colectimy   RADIOLOGY WITH ANESTHESIA N/A 03/21/2023   Procedure: RADIOLOGY WITH ANESTHESIA;  Surgeon: Radiologist, Medication, MD;  Location: [facility];  Service: Radiology;  Laterality: N/A;   TEE WITHOUT CARDIOVERSION N/A 07/19/2020   Procedure: TRANSESOPHAGEAL ECHOCARDIOGRAM (TEE);  Surgeon: Hester Wolm PARAS, MD;  Location: [facility];  Service: Cardiovascular;  Laterality: N/A;   WRIST SURGERY Left 11/07/2013   REPAIR ULNAR ARTERY, NERVE AND TENDON   Patient Active Problem List   Diagnosis Date Noted   Speech and language deficits 04/22/2023   Hemiparesis affecting right side as late effect of cerebrovascular accident (CVA) (HCC) 04/22/2023   Spasticity 04/22/2023   Chronic diarrhea 04/22/2023   Chronic schizophrenia (HCC) 04/01/2023   S/P right colectomy 09/04/2022   Dental decay 06/12/2022   Erectile dysfunction 07/26/2021   Hyperlipidemia LDL goal <70 03/28/2021   History of embolic stroke  90/72/7978   Drug abuse (HCC) 07/17/2020   Major depressive disorder, recurrent episode, severe (HCC) 02/20/2015   Alcohol use disorder, severe, dependence (HCC) 02/20/2015   Stimulant use disorder 02/20/2015   Tobacco use disorder 02/20/2015    PCP: Toribio SQUIBB. [place], [place]  REFERRING PROVIDER: Toribio SQUIBB. [place], [place]  REFERRING DIAGNOSIS: I69.351 (ICD-10-CM) - Hemiparesis affecting right side as late effect of cerebrovascular accident (CVA) (HCC)   THERAPY DIAG: No diagnosis found. ***  ONSET DATE: 03/21/23 (ED to hospital admission, CVA)  FOLLOW UP APPT WITH PROVIDER: Yes ; 03/11/24   RATIONALE FOR EVALUATION AND TREATMENT: Rehabilitation  Pertinent History  Daily subjective documented below in treatment session of notes.    43 y.o. 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  Patient reports that he has been losing his balance while he's walking. Tends to lose balance backwards. Difficulty expressing self due to aphasia.   Pt currently referred for R hemiparesis after stroke. Pt also has referrals for speech and OT.   Pain: No Numbness/Tingling: Yes - R forearm and hand  Focal Weakness: No Recent changes in overall health/medication: No Prior history of physical therapy for balance:  No Falls: Has patient fallen in last 6 months? 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 (horses, goats) Red flags (bowel/bladder changes, saddle paresthesia, personal history of cancer, h/o spinal tumors, h/o compression fx, h/o abdominal aneurysm, abdominal pain, chills/fever, night sweats, nausea, vomiting,  unrelenting pain): Negative  Precautions: None  Weight Bearing Restrictions: No  Living Environment Lives with: lives with mother  Lives in: Mobile home Has following equipment at home: shower chair   Patient Goals: to reduce falls and to improve balance      OBJECTIVE:   Patient Surveys  ABC: 68.8% (unsure if patient fully understood meaning of questionnaire)  Cognition Patient is oriented to person, place, and time.  Recent memory is intact.  Remote memory is impaired.  Attention span and concentration are intact.  Expressive speech is impaired.      Gross Musculoskeletal Assessment Tremor: Clonus BLE Bulk: Normal Tone: Mild to moderate spasticity for R upper/lower extremity flexors   GAIT: Distance walked: 54'  Assistive device utilized: None Level of assistance: Complete Independence Comments: drifts L/R, decreased foot clearance bilaterally intermittently, rounded shoulders    LE MMT:  MMT (out of 5) Right 03/11/2024 Left 03/11/2024  Hip flexion 4 4  Hip extension    Hip abduction 4 4  Hip adduction 4 4  Hip internal rotation    Hip external rotation    Knee flexion 4+ 4+  Knee extension 4+ 4+  Ankle dorsiflexion 4+ 4+  Ankle plantarflexion    Ankle inversion    Ankle eversion    (* = pain; Blank rows = not tested)   Sensation Grossly intact to light touch bilateral LEs as determined by testing dermatomes L2-S2. Proprioception, and hot/cold testing deferred on this date.   Reflexes R/L Knee Jerk (L3/4):deferred  Ankle Jerk (S1/2): deferred    Cranial Nerves  Deferred    Coordination/Cerebellar Finger to Nose: Slowed with RUE Heel to Shin: slightly impaired R LE Rapid alternating movements: impaired R LE Finger Opposition: unable on R UE, intact LUE Pronator Drift: Positive    Modified Ashworth Scale Biceps: R 3, L 1 Wrist flexors: R 0, L 0 Hamstrings: R 2, L 1 Gastrocnemius: R 3, L 3  0 No increase in tone 1 slight  increase in tone giving a catch when slight increase in muscle tone, manifested by the limb was moved in flexion or extension. 1+ slight increase in muscle tone, manifested by a catch followed by minimal resistance throughout (ROM ) 2 more marked increase in tone but more marked increased in muscle tone through most limb easily flexed 3 considerable increase in tone, passive movement difficult 4 limb rigid in flexion or extension     FUNCTIONAL OUTCOME MEASURES   Results Comments  BERG 54/56 Fall risk, in need of intervention  DGI 20/24   5TSTS 8.80 seconds   6 Minute Walk Test 1260 ft   Mini-BESTest 20/28   (Blank rows = not tested)   Clinical Test of Sensory Interaction for Balance    (CTSIB):  CONDITION TIME STRATEGY SWAY  Eyes open, firm surface 30 seconds ankle Minimal sway  Eyes closed, firm surface 30 seconds Ankle/hip Mild sway  Eyes open, foam surface 30 seconds ankle   Eyes closed, foam surface 30 seconds ankle Increased hip strategy, no significant LOB      TODAY'S TREATMENT: 05/05/2024  SUBJECTIVE STATEMENT:  *** Patient with late arrival to therapy session. Patient reports he is doing alright. Reports he is having  some pain in his feet, states it may be related to his diabetes. Denies stumbles/falls.  [facility]#[id number] for 8 PT vsts from 5/21-11/17 *** - today is 6 of 8 visits ***   Therapeutic Interventions:   *** - dual-task training (adding verbal component when appropriate to address speech impairments including difficulty with word finding, dysarthria, and apraxia of speech) - high level dynamic balance, gait, and BLE strengthening - reactive balance - integrate R UE into tasks when able for NMR   ***  Gait training 51ft, no AD, with CGA for safety.  Dual-task challenge of turning head to visually scan and name letters/numbers on sticky notes on wall No imbalance, but pt did have difficulty with verbalizing the numbers/letters requiring him to  walk much slower to allow time to think of and state the letter/number; however, this was improved compared to last session Progressed to backwards gait training for ~142ft and then added the same dual-task of naming letters/numbers No significant increased difficulty naming them during backwards gait  Dynamic balance interventions including:  Standing 1/2 tandem on airex pad while spelling words with magnetic letters using R UE to target hand dexterity NMR Performed at balance bar for safety to allow UE support if/when needed but used minimally Requires CGA/SBA for balance safety More difficulty with R LE forward  Pt has to slide the letters on the board rather than picking them up off the board due to impaired dexterity  Progressed to same task, but standing with normal BOS on BOSU ball (blue side down)  Cued pt to try and attempt to pull magnets off wall to move them and pt able to be successful with some of the larger letters Requires CGA for safety and only 2x min A and use of UE support on balance bar to regain balance   ***   PATIENT EDUCATION:  Education details: Discussed results of testing, goals of PT, cut-off scores for fall risk measures, plan of care. Discussed use of toe tapping drill on porch steps at home to work on standing coordination and adequate clearing of toes with each step.  Person educated: Patient Education method: Explanation, Demonstration, and Handouts Education comprehension: verbalized understanding and returned demonstration   HOME EXERCISE PROGRAM: Access Code: [email or web address] URL: [email or web address] Date: 03/11/2024 Prepared by: Maryanne Finder  Exercises - Sit to Stand with Arms Crossed  - 2-3 x daily - 5-7 x weekly - 3 sets - 10 reps - Standing March with Counter Support  - 2-3 x daily - 5-7 x weekly - 3 sets - 10 reps - Heel Raises with Counter Support  - 2-3 x daily - 5-7 x weekly - 3 sets - 10 reps   ASSESSMENT:  CLINICAL  IMPRESSION: *** Therapy session continued to focus on balance with dual-task challenges integrating verbal naming of items for language challenge and using R UE for NMR. Patient again with late arrival to therapy session, resulting in decreased treatment time. Therapy session specifically focused on standing on compliant surfaces with varying BOS, while performing R UE dual-task NMR challenge of spelling words of gradual increased difficulty. Noticed pt with significant challenge coordinating R hand movements to grasp the small letters and move them as desired. Patient demos minor balance impairment with greatest difficulty having R LE forward during tandem on airex and standing on BOSU ball. Patient continues to have remaining deficits in mild R hemiparesis, RUE/RLE coordination impairment, gait impairments, and intermittent postural control impairments with pt tending to loss balance backwards. Patient  will benefit from continued skilled PT to address above impairments and improve overall function.  REHAB POTENTIAL: Fair    CLINICAL DECISION MAKING: Evolving/moderate complexity  EVALUATION COMPLEXITY: Moderate   GOALS: Goals reviewed with patient? Yes  SHORT TERM GOALS: Target date: 04/22/2024  Pt will be independent with HEP in order to improve strength and balance in order to decrease fall risk and improve function at home. Baseline: 5/21: initiated Goal status: INITIAL   LONG TERM GOALS: Target date: 06/03/2024   1.  Pt will improve BERG by at least 3 points in order to demonstrate clinically significant improvement in balance.   Baseline: 5/21: to be completed visit #2.   03/19/24: 54/56 Goal status: INITIAL  2.  Pt will improve ABC by at least 13% in order to demonstrate clinically significant improvement in balance confidence.      Baseline: 5/21: 68.8% Goal status: INITIAL  3. Pt will improve DGI by at least 3 points in order to demonstrate clinically significant improvement in  balance and decreased risk for falls.     Baseline: 5/21: 20/24  Goal status: INITIAL  4. Pt will increase by at least 8m (167ft) in order to demonstrate clinically significant improvement in cardiopulmonary endurance and community ambulation   Baseline: 5/21: to be completed visit #2.   03/19/24: 1260 ft.  Goal status: INITIAL  3. Pt will improve MiniBEST by at least 4 points in order to demonstrate clinically significant improvement in balance and decreased risk for falls.     Baseline: 6/12: 20/28 Goal status: INITIAL  PLAN: PT FREQUENCY: 1-2x/week  PT DURATION: 12 weeks  PLANNED INTERVENTIONS: Therapeutic exercises, Therapeutic activity, Neuromuscular re-education, Balance training, Gait training, Patient/Family education, Joint manipulation, Joint mobilization, Canalith repositioning, Aquatic Therapy, Dry Needling, Cognitive remediation, Electrical stimulation, Spinal manipulation, Spinal mobilization, Cryotherapy, Moist heat, Traction, Ultrasound, Ionotophoresis 4mg /ml Dexamethasone , and Manual therapy  PLAN FOR NEXT SESSION: *** - dual-task training (adding verbal component when appropriate to address speech impairments including difficulty with word finding, dysarthria, and apraxia of speech) - high level dynamic balance, gait, and BLE strengthening - reactive balance - integrate R UE into tasks when able for NMR   Connell Kiss, PT, DPT, NCS, CSRS Physical Therapist - [facility]  [facility]  7:47 AM 05/05/24

## 2024-05-05 NOTE — Telephone Encounter (Signed)
 Therapist attempted to contact pt via telephone 2x regarding missed visit; however, no answer and pt's phone not allowing voicemails at this time.    Connell Kiss, PT, DPT, NCS, CSRS

## 2024-05-06 ENCOUNTER — Ambulatory Visit: Payer: MEDICAID | Admitting: Physical Therapy

## 2024-05-13 ENCOUNTER — Ambulatory Visit: Payer: MEDICAID | Admitting: Physical Therapy

## 2024-05-13 ENCOUNTER — Ambulatory Visit: Payer: MEDICAID

## 2024-05-20 ENCOUNTER — Ambulatory Visit: Payer: MEDICAID | Admitting: Physical Therapy

## 2024-05-20 ENCOUNTER — Ambulatory Visit: Payer: MEDICAID

## 2024-05-27 ENCOUNTER — Ambulatory Visit: Payer: MEDICAID

## 2024-05-27 ENCOUNTER — Ambulatory Visit: Payer: MEDICAID | Attending: Physician Assistant

## 2024-05-27 DIAGNOSIS — R482 Apraxia: Secondary | ICD-10-CM | POA: Insufficient documentation

## 2024-05-27 DIAGNOSIS — R2681 Unsteadiness on feet: Secondary | ICD-10-CM | POA: Insufficient documentation

## 2024-05-27 DIAGNOSIS — I63512 Cerebral infarction due to unspecified occlusion or stenosis of left middle cerebral artery: Secondary | ICD-10-CM | POA: Insufficient documentation

## 2024-05-27 DIAGNOSIS — R278 Other lack of coordination: Secondary | ICD-10-CM | POA: Diagnosis present

## 2024-05-27 DIAGNOSIS — I69351 Hemiplegia and hemiparesis following cerebral infarction affecting right dominant side: Secondary | ICD-10-CM | POA: Insufficient documentation

## 2024-05-27 DIAGNOSIS — M6281 Muscle weakness (generalized): Secondary | ICD-10-CM | POA: Diagnosis present

## 2024-05-27 DIAGNOSIS — R4701 Aphasia: Secondary | ICD-10-CM | POA: Diagnosis present

## 2024-05-27 DIAGNOSIS — R471 Dysarthria and anarthria: Secondary | ICD-10-CM | POA: Diagnosis present

## 2024-05-27 NOTE — Therapy (Signed)
 OUTPATIENT PHYSICAL THERAPY TREATMENT  Patient Name: Brian Rios MRN: [id number] DOB:1981/05/13, 43 y.o., male Today's Date: 04/02/2024  END OF SESSION:    PT End of Session - 05/27/24 1119     Visit Number 6    Number of Visits 25    Date for PT Re-Evaluation 06/03/24    Authorization Type [employer] plan    PT Start Time 1110    PT Stop Time 1140    PT Time Calculation (min) 30 min    Activity Tolerance Patient tolerated treatment well;No increased pain    Behavior During Therapy [facility] for tasks assessed/performed         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   left cerebrum   Erectile dysfunction    Heart murmur    Hyperlipidemia    Schizophrenia (HCC)    Tobacco abuse    Past Surgical History:  Procedure Laterality Date   BOWEL RESECTION N/A 09/04/2022   Procedure: SMALL BOWEL RESECTION, open;  Surgeon: Jordis Laneta FALCON, MD;  Location: [facility];  Service: General;  Laterality: N/A;   IR CT HEAD LTD  03/21/2023   IR CT HEAD LTD  03/21/2023   IR PERCUTANEOUS ART THROMBECTOMY/INFUSION INTRACRANIAL INC DIAG ANGIO  03/21/2023   ORIF ORBITAL FRACTURE  1985   PARTIAL COLECTOMY Right 09/04/2022   Procedure: PARTIAL COLECTOMY;  Surgeon: Jordis Laneta FALCON, MD;  Location: [facility];  Service: General;  Laterality: Right;  Right colectimy   RADIOLOGY WITH ANESTHESIA N/A 03/21/2023   Procedure: RADIOLOGY WITH ANESTHESIA;  Surgeon: Radiologist, Medication, MD;  Location: [facility];  Service: Radiology;  Laterality: N/A;   TEE WITHOUT CARDIOVERSION N/A 07/19/2020   Procedure: TRANSESOPHAGEAL ECHOCARDIOGRAM (TEE);  Surgeon: Hester Wolm PARAS, MD;  Location: [facility];  Service: Cardiovascular;  Laterality: N/A;   WRIST SURGERY Left 11/07/2013   REPAIR ULNAR ARTERY, NERVE AND TENDON   Patient Active Problem List   Diagnosis Date Noted   Speech and language deficits  04/22/2023   Hemiparesis affecting right side as late effect of cerebrovascular accident (CVA) (HCC) 04/22/2023   Spasticity 04/22/2023   Chronic diarrhea 04/22/2023   Chronic schizophrenia (HCC) 04/01/2023   S/P right colectomy 09/04/2022   Dental decay 06/12/2022   Erectile dysfunction 07/26/2021   Hyperlipidemia LDL goal <70 03/28/2021   History of embolic stroke 07/18/2020   Drug abuse (HCC) 07/17/2020   Major depressive disorder, recurrent episode, severe (HCC) 02/20/2015   Alcohol use disorder, severe, dependence (HCC) 02/20/2015   Stimulant use disorder 02/20/2015   Tobacco use disorder 02/20/2015   PCP: Toribio SQUIBB. [place], [place] REFERRING PROVIDER: Toribio SQUIBB. [place], [place] REFERRING DIAGNOSIS: I69.351 (ICD-10-CM) - Hemiparesis affecting right side as late effect of cerebrovascular accident (CVA) (HCC)  THERAPY DIAG: Other lack of coordination  Muscle weakness (generalized)  Unsteadiness on feet   ONSET DATE: 03/21/23 (ED to hospital admission, CVA)  RATIONALE FOR EVALUATION AND TREATMENT: Rehabilitation Subjective: No updates. Pt seemingly unaware for his 4 week absence here. Pt reports his HEP has been too easy, needs updating.   Pertinent History  43 y.o. 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  Patient reports that  he has been losing his balance while he's walking. Tends to lose balance backwards. Difficulty expressing self due to aphasia.   Pt currently referred for R hemiparesis after stroke. Pt also has referrals for speech and OT.   Pain: No Numbness/Tingling: Yes - R forearm and hand  Focal Weakness: No Recent changes in overall health/medication: No Prior history of physical therapy for balance:  No Falls: Has patient fallen in last 6  months? 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, goats) Red flags (bowel/bladder changes, saddle paresthesia, personal history of cancer, h/o spinal tumors, h/o compression fx, h/o abdominal aneurysm, abdominal pain, chills/fever, night sweats, nausea, vomiting, unrelenting pain): Negative  Precautions: None  Weight Bearing Restrictions: No  Living Environment Lives with: lives with mother  Lives in: Mobile home Has following equipment at home: shower chair   Patient Goals: to reduce falls and to improve balance    OBJECTIVE:   TODAY'S TREATMENT: 05/27/2024  - 1322ft, no device, no LOB. -5xSTS: 8.57sec hands free -61.3% ABC Scale -MINBESTEST: 25/28  PATIENT EDUCATION:  Education details: Discussed results of testing, goals of PT, cut-off scores for fall risk measures, plan of care. Discussed use of toe tapping drill on porch steps at home to work on standing coordination and adequate clearing of toes with each step.  Person educated: Patient Education method: Explanation, Demonstration, and Handouts Education comprehension: verbalized understanding and returned demonstration   HOME EXERCISE PROGRAM: Access Code: [email or web address] URL: [email or web address] Date: 03/11/2024 Prepared by: Maryanne Finder  Exercises - Sit to Stand with Arms Crossed  - 2-3 x daily - 5-7 x weekly - 3 sets - 10 reps - Standing March with Counter Support  - 2-3 x daily - 5-7 x weekly - 3 sets - 10 reps - Heel Raises with Counter Support  - 2-3 x daily - 5-7 x weekly - 3 sets - 10 reps   ASSESSMENT:  CLINICAL IMPRESSION: Pt has 1 remaining apporved visit, has made excellent progress overall despite remaining limited confidence in his balance. HEP for sure needs to be updated. Will give updated HEP next session and  plan on DC. Patient will benefit from skilled physical therapy intervention to reduce deficits and impairments identified in evaluation, in order to reduce pain, improve quality of life, and maximize activity tolerance for ADL, IADL, and leisure/fitness. Physical therapy will help pt achieve long and short term goals of care.   REHAB POTENTIAL: Fair    CLINICAL DECISION MAKING: Evolving/moderate complexity  EVALUATION COMPLEXITY: Moderate   GOALS: Goals reviewed with patient? Yes  SHORT TERM GOALS: Target date: 04/22/2024  Pt will be independent with HEP in order to improve strength and balance in order to decrease fall risk and improve function at home. Baseline: 5/21: initiated Goal status: Needs updating   LONG TERM GOALS: Target date: 06/03/2024  1.  Pt will improve BERG by at least 3 points in order to demonstrate clinically significant improvement in balance.   Baseline: 5/21: to be completed visit #2.   03/19/24: 54/56 Goal status: IMPOSSIBLE    2.  Pt will improve ABC by at least 13% in order to demonstrate clinically significant improvement in balance confidence.      Baseline: 5/21: 68.8%; 05/27/24: 63% Goal status: NOT MET   3. Pt will improve DGI by at least 3 points in order to  demonstrate clinically significant improvement in balance and decreased risk for falls.     Baseline: 5/21: 20/24  Goal status: INITIAL  4. Pt will increase by at least 63m (167ft) in order to demonstrate clinically significant improvement in cardiopulmonary endurance and community ambulation   Baseline: 5/21: to be completed visit #2.   03/19/24: 1260 ft; 05/27/24: 1367ft  Goal status: IN PROGRESSION  3. Pt will improve MiniBEST by at least 4 points in order to demonstrate clinically significant improvement in balance and decreased risk for falls.     Baseline: 6/12: 20/28; 05/27/24: 25/28 Goal status: ACHIEVEMENT   PLAN: PT FREQUENCY: 1-2x/week  PT DURATION: 12 weeks  PLANNED  INTERVENTIONS: Therapeutic exercises, Therapeutic activity, Neuromuscular re-education, Balance training, Gait training, Patient/Family education, Joint manipulation, Joint mobilization, Canalith repositioning, Aquatic Therapy, Dry Needling, Cognitive remediation, Electrical stimulation, Spinal manipulation, Spinal mobilization, Cryotherapy, Moist heat, Traction, Ultrasound, Ionotophoresis 4mg /ml Dexamethasone , and Manual therapy  PLAN FOR NEXT SESSION:  UPDATE HEP, provider and DC.   11:21 AM, 05/27/24 Peggye JAYSON Linear, PT, DPT Physical Therapist - [facility]  Outpatient Physical Therapy- [facility] [phone]

## 2024-05-27 NOTE — Therapy (Signed)
 OUTPATIENT SPEECH LANGUAGE PATHOLOGY APHASIA TREATMENT   Patient Name: Brian Rios MRN: [id number] DOB:03-Apr-1981, 43 y.o., male Today's Date: 05/27/2024  PCP: Toribio Hoyle, PA REFERRING PROVIDER: Arthea Farrow, MD   End of Session - 04/28/24 1056     Visit Number 3   Number of Visits 12    Date for SLP Re-Evaluation 07/14/24    Authorization Type [employer]    Progress Note Due on Visit 10    SLP Start Time 1145   SLP Stop Time  1230   SLP Time Calculation (min) 35 min    Activity Tolerance Patient tolerated treatment well; however, requested to terminate session early          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   left cerebrum   Erectile dysfunction    Heart murmur    Hyperlipidemia    Schizophrenia (HCC)    Tobacco abuse    Past Surgical History:  Procedure Laterality Date   BOWEL RESECTION N/A 09/04/2022   Procedure: SMALL BOWEL RESECTION, open;  Surgeon: Jordis Laneta FALCON, MD;  Location: [facility];  Service: General;  Laterality: N/A;   IR CT HEAD LTD  03/21/2023   IR CT HEAD LTD  03/21/2023   IR PERCUTANEOUS ART THROMBECTOMY/INFUSION INTRACRANIAL INC DIAG ANGIO  03/21/2023   ORIF ORBITAL FRACTURE  1985   PARTIAL COLECTOMY Right 09/04/2022   Procedure: PARTIAL COLECTOMY;  Surgeon: Jordis Laneta FALCON, MD;  Location: [facility];  Service: General;  Laterality: Right;  Right colectimy   RADIOLOGY WITH ANESTHESIA N/A 03/21/2023   Procedure: RADIOLOGY WITH ANESTHESIA;  Surgeon: Radiologist, Medication, MD;  Location: [facility];  Service: Radiology;  Laterality: N/A;   TEE WITHOUT CARDIOVERSION N/A 07/19/2020   Procedure: TRANSESOPHAGEAL ECHOCARDIOGRAM (TEE);  Surgeon: Hester Wolm PARAS, MD;  Location: [facility];  Service: Cardiovascular;  Laterality: N/A;   WRIST SURGERY Left 11/07/2013   REPAIR ULNAR ARTERY, NERVE AND TENDON   Patient Active  Problem List   Diagnosis Date Noted   Speech and language deficits 04/22/2023   Hemiparesis affecting right side as late effect of cerebrovascular accident (CVA) (HCC) 04/22/2023   Spasticity 04/22/2023   Chronic diarrhea 04/22/2023   Chronic schizophrenia (HCC) 04/01/2023   S/P right colectomy 09/04/2022   Dental decay 06/12/2022   Erectile dysfunction 07/26/2021   Hyperlipidemia LDL goal <70 03/28/2021   History of embolic stroke 07/18/2020   Drug abuse (HCC) 07/17/2020   Major depressive disorder, recurrent episode, severe (HCC) 02/20/2015   Alcohol use disorder, severe, dependence (HCC) 02/20/2015   Stimulant use disorder 02/20/2015   Tobacco use disorder 02/20/2015    ONSET DATE: 03/22/2023; referral date 04/10/24  REFERRING DIAG:  P30.648 (ICD-10-CM) - Hemiparesis affecting right side as late effect of cerebrovascular accident (CVA) (HCC)  Z86.73 (ICD-10-CM) - History of embolic stroke  Q19.0 (ICD-10-CM) - Speech and language deficits    THERAPY DIAG: aphasia, apraxia, dysarthria   Rationale for Evaluation and Treatment Rehabilitation  SUBJECTIVE:   SUBJECTIVE STATEMENT: Pt with alert, distracted Pt accompanied by: self  PERTINENT HISTORY and DIAGNOSTIC FINDINGS: Patient is a 43 year old right-handed male with PMH of alcohol/tobacco use, left basal ganglia embolic stroke in 2021 (no residual deficits), schizophrenia maintained on Abilify , Depakote , Lexapro  as well as Cogentin , hyperlipidemia, and 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 Presented 03/21/2023 to [facility] with acute onset of  right-sided weakness facial droop and aphasia. Cranial CT scan worrisome for acute infarct in the posterior left parietal lobe. CTA showed occlusion of 2 proximal left M2 branches and mild left M1 stenosis. Widely patent carotid arteries.  Patient transferred to [facility] for admission and underwent arteriogram with revascularization of occluded left MCA however it reoccluded. MRI of the  brain showed interval increase in size of acute/subacute nonhemorrhagic infarct involving the majority the left insular cortex and posterior left frontal operculum extending into the high posterior left parietal lobe. No acute hemorrhage or significant mass effect. Patient admitted to [facility] on 03/27/23, discharged .  PAIN:  Are you having pain? No  FALLS: Has patient fallen in last 6 months?  No  LIVING ENVIRONMENT: Lives with: mother Lives in: Mobile home  PLOF:  Level of assistance: Independent with ADLs, Independent with IADLs    PATIENT GOALS    to get my speech right   OBJECTIVE:  TODAY'S TREATMENT:  Reviewed compensations for dysarthria and circumlocution/SFA for wordfinding. Handout provided. Mod cueing to utilize compensations for dysarthria and anomia during picture description/naming task. Reduced carry-over noted.    Worksheets provided for HEP targeting circumlocution/SFA and divergent naming.   Pt seemed restless and distracted throughout session, session terminated early at pt request again.   PATIENT EDUCATION: Education details: as above Person educated: Patient Education method: Programmer, multimedia; Handout Education comprehension: needs further education  HOME EXERCISE PROGRAM:   Worksheets provided  Use of compensations    GOALS:  Goals reviewed with patient? Yes  SHORT TERM GOALS: Target date: 10 sessions  With Min A, patient will describe visual scenes using 3 or more sentences at 75% accuracy. Baseline: Goal status: INITIAL  2.  With Moderate A, patient will complete a semantic feature analysis with at least 3 relevant features for 4/5 target words to improve word-finding skills.    Baseline:  Goal status: INITIAL  3.  Pt will utilize compensatory strategies for dysarthria with min cueing for 8/10 sentences to improve speech intelligibility.  Baseline:  Goal status: INITIAL  LONG TERM GOALS: Target date: 12 weeks  With min A, pt will utilize  compensatory strategies for anomia at the conversation level.  Baseline:  Goal status: INITIAL  2.  With min A, pt will utilize compensatory strategies for dysarthria at the conversation level.   Baseline:  Goal status: INITIAL  3.  With Rare Min A, patient/family will demonstrate understanding of the following concepts: aphasia, spontaneous recovery, communication vs conversation, strengths/strategies to promote success, local resources by answering multiple choice questions with 75% accuracy when provided supported conversation in order to increase patient's participation in medical care.    Baseline:  Goal status: INITIAL  ASSESSMENT:  CLINICAL IMPRESSION: Patient is a 43 y.o. right handed male who was seen today for a speech language tx. 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 Pt's expressive communication is c/b short grammatically incomplete utterances d/t word finding deficits. Pt's speech intelligibility is reduced by imprecise articulation, low vocal intensity and fast rate of speech resulting in ~ 70% speech intelligibility at the sentence level. See details of tx above. Recommend course of ST targeting compensations and education for above mentioned deficits.  OBJECTIVE IMPAIRMENTS include awareness, expressive language, aphasia, apraxia, and dysarthria. These impairments are limiting patient from return to work, managing appointments, managing finances, ADLs/IADLs, and effectively communicating at home and in community. Factors affecting potential to achieve goals and functional  outcome are co-morbidities, medical prognosis, previous level of function, severity of impairments, and insurance limits on number of visits. Patient will benefit from skilled SLP services to address above impairments and improve overall function.  REHAB POTENTIAL: Good  PLAN: SLP FREQUENCY: 1x/week  SLP DURATION: 12  weeks  PLANNED INTERVENTIONS: Language facilitation, Cueing hierachy, Internal/external aids, Oral motor exercises, Multimodal communication approach, SLP instruction and feedback, Compensatory strategies, and Patient/family education    Delon Bangs, M.S., CCC-SLP Speech-Language Pathologist [facility] [phone] (ASCOM)

## 2024-06-01 ENCOUNTER — Telehealth: Payer: Self-pay

## 2024-06-01 NOTE — Telephone Encounter (Signed)
 Copied from CRM #[id number]. Topic: Clinical - Medical Advice >> Jun 01, 2024 10:22 AM Chiquita SQUIBB wrote: Reason for CRM: Patient is calling in asking if he would be able to donate plasma, please contact the patient.

## 2024-06-01 NOTE — Telephone Encounter (Signed)
 Tried to call patient twice. Got a error message call can't be completed as dialed check the number or call local directory.

## 2024-06-02 NOTE — Therapy (Deleted)
 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   left cerebrum   Erectile dysfunction    Heart murmur    Hyperlipidemia    Schizophrenia (HCC)    Tobacco abuse    Past Surgical History:  Procedure Laterality Date   BOWEL RESECTION N/A 09/04/2022   Procedure: SMALL BOWEL RESECTION, open;  Surgeon: Jordis Laneta FALCON, MD;  Location: [facility];  Service: General;  Laterality: N/A;   IR CT HEAD LTD  03/21/2023   IR CT HEAD LTD  03/21/2023   IR PERCUTANEOUS ART THROMBECTOMY/INFUSION INTRACRANIAL INC DIAG ANGIO  03/21/2023   ORIF ORBITAL FRACTURE  1985   PARTIAL COLECTOMY Right 09/04/2022   Procedure: PARTIAL COLECTOMY;  Surgeon: Jordis Laneta FALCON, MD;  Location: [facility];  Service: General;  Laterality: Right;  Right colectimy   RADIOLOGY WITH ANESTHESIA N/A 03/21/2023   Procedure: RADIOLOGY WITH ANESTHESIA;  Surgeon: Radiologist, Medication, MD;  Location: [facility];  Service: Radiology;  Laterality: N/A;   TEE WITHOUT CARDIOVERSION N/A 07/19/2020   Procedure: TRANSESOPHAGEAL ECHOCARDIOGRAM (TEE);  Surgeon: Hester Wolm PARAS, MD;  Location: [facility];  Service: Cardiovascular;  Laterality: N/A;   WRIST SURGERY Left 11/07/2013   REPAIR ULNAR ARTERY, NERVE AND TENDON   Patient Active Problem List   Diagnosis Date Noted   Speech and language deficits 04/22/2023   Hemiparesis affecting right side as late effect of cerebrovascular accident (CVA) (HCC) 04/22/2023   Spasticity 04/22/2023   Chronic diarrhea 04/22/2023   Chronic schizophrenia (HCC) 04/01/2023   S/P right colectomy 09/04/2022   Dental decay 06/12/2022   Erectile dysfunction 07/26/2021   Hyperlipidemia LDL goal <70 03/28/2021   History of embolic stroke 07/18/2020   Drug  abuse (HCC) 07/17/2020   Major depressive disorder, recurrent episode, severe (HCC) 02/20/2015   Alcohol use disorder, severe, dependence (HCC) 02/20/2015   Stimulant use disorder 02/20/2015   Tobacco use disorder 02/20/2015   PCP: Toribio SQUIBB. [place], [place] REFERRING PROVIDER: Toribio SQUIBB. [place], [place] REFERRING DIAGNOSIS: I69.351 (ICD-10-CM) - Hemiparesis affecting right side as late effect of cerebrovascular accident (CVA) (HCC)  THERAPY DIAG: No diagnosis found.   ONSET DATE: 03/21/23 (ED to hospital admission, CVA)  RATIONALE FOR EVALUATION AND TREATMENT: Rehabilitation Subjective: No updates. Pt seemingly unaware for his 4 week absence here. Pt reports his HEP has been too easy, needs updating.   Pertinent History  43 y.o. 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  Patient reports that he has been losing his balance while he's walking. Tends to lose balance backwards. Difficulty expressing self due to aphasia.   Pt currently referred for R hemiparesis after stroke. Pt also has referrals for speech and OT.   Pain: No Numbness/Tingling: Yes - R forearm and hand  Focal Weakness: No Recent changes in overall health/medication: No Prior history of physical therapy for balance:  No Falls: Has patient fallen in last 6 months? 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 -5xSTS: 8.57sec hands free -61.3% ABC Scale -MINBESTEST: 25/28  PATIENT EDUCATION:  Education details: Discussed results of testing, goals of PT, cut-off scores for fall risk measures, plan of care. Discussed use of toe tapping drill on porch steps at home to work on standing coordination and adequate clearing of toes with each step.  Person educated: Patient Education method: Explanation, Demonstration, and Handouts Education comprehension: verbalized understanding and returned demonstration   HOME EXERCISE PROGRAM: Access Code: [email or web address] URL: [email or web address] Date: 03/11/2024 Prepared by: Maryanne Finder  Exercises - Sit to Stand with Arms Crossed  - 2-3 x daily - 5-7 x weekly - 3 sets - 10 reps - Standing March with Counter Support  - 2-3 x daily - 5-7 x weekly - 3 sets - 10 reps - Heel Raises with Counter Support  - 2-3 x daily - 5-7 x weekly - 3 sets - 10 reps   ASSESSMENT:  CLINICAL IMPRESSION: Pt has 1 remaining apporved visit, has made excellent progress overall despite remaining limited confidence in his balance. HEP for sure needs to be updated. Will give updated HEP next session and plan on DC. Patient will benefit from skilled physical therapy intervention to reduce deficits and impairments identified in evaluation, in order to reduce pain, improve quality of life, and maximize activity tolerance for ADL, IADL, and leisure/fitness. Physical therapy will help pt achieve long and short term goals of care.   REHAB POTENTIAL: Fair    CLINICAL DECISION MAKING: Evolving/moderate complexity  EVALUATION COMPLEXITY:  Moderate   GOALS: Goals reviewed with patient? Yes  SHORT TERM GOALS: Target date: 04/22/2024  Pt will be independent with HEP in order to improve strength and balance in order to decrease fall risk and improve function at home. Baseline: 5/21: initiated Goal status: Needs updating   LONG TERM GOALS: Target date: 06/03/2024  1.  Pt will improve BERG by at least 3 points in order to demonstrate clinically significant improvement in balance.   Baseline: 5/21: to be completed visit #2.   03/19/24: 54/56 Goal status: IMPOSSIBLE    2.  Pt will improve ABC by at least 13% in order to demonstrate clinically significant improvement in balance confidence.      Baseline: 5/21: 68.8%; 05/27/24: 63% Goal status: NOT MET   3. Pt will improve DGI by at least 3 points in order to demonstrate clinically significant improvement in balance and decreased risk for falls.     Baseline: 5/21: 20/24  Goal status: INITIAL  4. Pt will increase by at least 69m (14ft) in order to demonstrate clinically significant improvement in cardiopulmonary endurance and community ambulation   Baseline: 5/21: to be completed visit #2.   03/19/24: 1260 ft; 05/27/24: 1361ft  Goal status: IN PROGRESSION  3. Pt will improve MiniBEST by at least 4 points in order to demonstrate clinically significant improvement in balance and decreased risk for falls.     Baseline: 6/12:  20/28; 05/27/24: 25/28 Goal status: ACHIEVEMENT   PLAN: PT FREQUENCY: 1-2x/week  PT DURATION: 12 weeks  PLANNED INTERVENTIONS: Therapeutic exercises, Therapeutic activity, Neuromuscular re-education, Balance training, Gait training, Patient/Family education, Joint manipulation, Joint mobilization, Canalith repositioning, Aquatic Therapy, Dry Needling, Cognitive remediation, Electrical stimulation, Spinal manipulation, Spinal mobilization, Cryotherapy, Moist heat, Traction, Ultrasound, Ionotophoresis 4mg /ml Dexamethasone , and Manual therapy  PLAN FOR NEXT  SESSION:  UPDATE HEP, provider and DC.   3:43 PM, 06/02/24 Peggye JAYSON Linear, PT, DPT Physical Therapist - [facility]  Outpatient Physical Therapy- [facility] [phone]

## 2024-06-02 NOTE — Telephone Encounter (Signed)
 Called pt phone not in service.  KP

## 2024-06-03 ENCOUNTER — Ambulatory Visit: Payer: MEDICAID | Admitting: Physical Therapy

## 2024-06-03 ENCOUNTER — Ambulatory Visit: Payer: MEDICAID

## 2024-06-04 NOTE — Telephone Encounter (Signed)
 Spoke with patient and informed him he can't give plasma. He verbalized understanding.  JM

## 2024-06-04 NOTE — Telephone Encounter (Signed)
 Patient phone not in service. JM

## 2024-06-10 ENCOUNTER — Ambulatory Visit: Payer: MEDICAID

## 2024-06-10 DIAGNOSIS — M6281 Muscle weakness (generalized): Secondary | ICD-10-CM

## 2024-06-10 DIAGNOSIS — R278 Other lack of coordination: Secondary | ICD-10-CM

## 2024-06-10 DIAGNOSIS — R4701 Aphasia: Secondary | ICD-10-CM

## 2024-06-10 DIAGNOSIS — R2681 Unsteadiness on feet: Secondary | ICD-10-CM

## 2024-06-10 DIAGNOSIS — R482 Apraxia: Secondary | ICD-10-CM

## 2024-06-10 DIAGNOSIS — I69351 Hemiplegia and hemiparesis following cerebral infarction affecting right dominant side: Secondary | ICD-10-CM

## 2024-06-10 DIAGNOSIS — R471 Dysarthria and anarthria: Secondary | ICD-10-CM

## 2024-06-10 DIAGNOSIS — I63512 Cerebral infarction due to unspecified occlusion or stenosis of left middle cerebral artery: Secondary | ICD-10-CM

## 2024-06-10 NOTE — Therapy (Signed)
 OUTPATIENT SPEECH LANGUAGE PATHOLOGY APHASIA TREATMENT   Patient Name: Brian Rios MRN: [id number] DOB:1980-12-07, 43 y.o., male Today's Date: 06/10/2024  PCP: Toribio Hoyle, PA REFERRING PROVIDER: Arthea Farrow, MD   End of Session - 04/28/24 1056     Visit Number 3   Number of Visits 12    Date for SLP Re-Evaluation 07/14/24    Authorization Type [employer]    Progress Note Due on Visit 10    SLP Start Time 1145   SLP Stop Time  1230   SLP Time Calculation (min) 35 min    Activity Tolerance Patient tolerated treatment well; however, requested to terminate session early          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   left cerebrum   Erectile dysfunction    Heart murmur    Hyperlipidemia    Schizophrenia (HCC)    Tobacco abuse    Past Surgical History:  Procedure Laterality Date   BOWEL RESECTION N/A 09/04/2022   Procedure: SMALL BOWEL RESECTION, open;  Surgeon: Jordis Laneta FALCON, MD;  Location: [facility];  Service: General;  Laterality: N/A;   IR CT HEAD LTD  03/21/2023   IR CT HEAD LTD  03/21/2023   IR PERCUTANEOUS ART THROMBECTOMY/INFUSION INTRACRANIAL INC DIAG ANGIO  03/21/2023   ORIF ORBITAL FRACTURE  1985   PARTIAL COLECTOMY Right 09/04/2022   Procedure: PARTIAL COLECTOMY;  Surgeon: Jordis Laneta FALCON, MD;  Location: [facility];  Service: General;  Laterality: Right;  Right colectimy   RADIOLOGY WITH ANESTHESIA N/A 03/21/2023   Procedure: RADIOLOGY WITH ANESTHESIA;  Surgeon: Radiologist, Medication, MD;  Location: [facility];  Service: Radiology;  Laterality: N/A;   TEE WITHOUT CARDIOVERSION N/A 07/19/2020   Procedure: TRANSESOPHAGEAL ECHOCARDIOGRAM (TEE);  Surgeon: Hester Wolm PARAS, MD;  Location: [facility];  Service: Cardiovascular;  Laterality: N/A;   WRIST SURGERY Left 11/07/2013   REPAIR ULNAR ARTERY, NERVE AND TENDON   Patient Active  Problem List   Diagnosis Date Noted   Speech and language deficits 04/22/2023   Hemiparesis affecting right side as late effect of cerebrovascular accident (CVA) (HCC) 04/22/2023   Spasticity 04/22/2023   Chronic diarrhea 04/22/2023   Chronic schizophrenia (HCC) 04/01/2023   S/P right colectomy 09/04/2022   Dental decay 06/12/2022   Erectile dysfunction 07/26/2021   Hyperlipidemia LDL goal <70 03/28/2021   History of embolic stroke 07/18/2020   Drug abuse (HCC) 07/17/2020   Major depressive disorder, recurrent episode, severe (HCC) 02/20/2015   Alcohol use disorder, severe, dependence (HCC) 02/20/2015   Stimulant use disorder 02/20/2015   Tobacco use disorder 02/20/2015    ONSET DATE: 03/22/2023; referral date 04/10/24  REFERRING DIAG:  P30.648 (ICD-10-CM) - Hemiparesis affecting right side as late effect of cerebrovascular accident (CVA) (HCC)  Z86.73 (ICD-10-CM) - History of embolic stroke  Q19.0 (ICD-10-CM) - Speech and language deficits    THERAPY DIAG: aphasia, apraxia, dysarthria   Rationale for Evaluation and Treatment Rehabilitation  SUBJECTIVE:   SUBJECTIVE STATEMENT: Pt with alert, distracted Pt accompanied by: self  PERTINENT HISTORY and DIAGNOSTIC FINDINGS: Patient is a 43 year old right-handed male with PMH of alcohol/tobacco use, left basal ganglia embolic stroke in 2021 (no residual deficits), schizophrenia maintained on Abilify , Depakote , Lexapro  as well as Cogentin , hyperlipidemia, and 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 Presented 03/21/2023 to [facility] with acute onset of  right-sided weakness facial droop and aphasia. Cranial CT scan worrisome for acute infarct in the posterior left parietal lobe. CTA showed occlusion of 2 proximal left M2 branches and mild left M1 stenosis. Widely patent carotid arteries.  Patient transferred to [facility] for admission and underwent arteriogram with revascularization of occluded left MCA however it reoccluded. MRI of the  brain showed interval increase in size of acute/subacute nonhemorrhagic infarct involving the majority the left insular cortex and posterior left frontal operculum extending into the high posterior left parietal lobe. No acute hemorrhage or significant mass effect. Patient admitted to [facility] on 03/27/23, discharged .  PAIN:  Are you having pain? No  FALLS: Has patient fallen in last 6 months?  No  LIVING ENVIRONMENT: Lives with: mother Lives in: Mobile home  PLOF:  Level of assistance: Independent with ADLs, Independent with IADLs    PATIENT GOALS    to get my speech right   OBJECTIVE:  TODAY'S TREATMENT:  Reviewed compensations for dysarthria and circumlocution/SFA for wordfinding. Handout provided. Min cueing to utilize compensations for dysarthria and anomia during naming to description task.   Pt seemed restless and distracted throughout session, session terminated early at pt request again.   PATIENT EDUCATION: Education details: as above Person educated: Patient Education method: Programmer, multimedia; Handout Education comprehension: needs further education  HOME EXERCISE PROGRAM:   Worksheets provided  Use of compensations    GOALS:  Goals reviewed with patient? Yes  SHORT TERM GOALS: Target date: 10 sessions  With Min A, patient will describe visual scenes using 3 or more sentences at 75% accuracy. Baseline: Goal status: INITIAL  2.  With Moderate A, patient will complete a semantic feature analysis with at least 3 relevant features for 4/5 target words to improve word-finding skills.    Baseline:  Goal status: INITIAL  3.  Pt will utilize compensatory strategies for dysarthria with min cueing for 8/10 sentences to improve speech intelligibility.  Baseline:  Goal status: INITIAL  LONG TERM GOALS: Target date: 12 weeks  With min A, pt will utilize compensatory strategies for anomia at the conversation level.  Baseline:  Goal status: INITIAL  2.  With min A,  pt will utilize compensatory strategies for dysarthria at the conversation level.   Baseline:  Goal status: INITIAL  3.  With Rare Min A, patient/family will demonstrate understanding of the following concepts: aphasia, spontaneous recovery, communication vs conversation, strengths/strategies to promote success, local resources by answering multiple choice questions with 75% accuracy when provided supported conversation in order to increase patient's participation in medical care.    Baseline:  Goal status: INITIAL  ASSESSMENT:  CLINICAL IMPRESSION: Patient is a 43 y.o. right handed male who was seen today for a speech language tx. 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 Pt's expressive communication is c/b short grammatically incomplete utterances d/t word finding deficits. Pt's speech intelligibility is reduced by imprecise articulation, low vocal intensity and fast rate of speech resulting in ~ 70% speech intelligibility at the sentence level. See details of tx above. Recommend course of ST targeting compensations and education for above mentioned deficits.  OBJECTIVE IMPAIRMENTS include awareness, expressive language, aphasia, apraxia, and dysarthria. These impairments are limiting patient from return to work, managing appointments, managing finances, ADLs/IADLs, and effectively communicating at home and in community. Factors affecting potential to achieve goals and functional outcome are co-morbidities, medical prognosis, previous level of function, severity of impairments, and insurance  limits on number of visits. Patient will benefit from skilled SLP services to address above impairments and improve overall function.  REHAB POTENTIAL: Good  PLAN: SLP FREQUENCY: 1x/week  SLP DURATION: 12 weeks  PLANNED INTERVENTIONS: Language facilitation, Cueing hierachy, Internal/external aids, Oral motor exercises,  Multimodal communication approach, SLP instruction and feedback, Compensatory strategies, and Patient/family education    Delon Bangs, M.S., CCC-SLP Speech-Language Pathologist [place] - [facility] [phone] (ASCOM)

## 2024-06-10 NOTE — Therapy (Addendum)
 OUTPATIENT PHYSICAL THERAPY TREATMENT/RE-CERT  Patient Name: Brian Rios MRN: [id number] DOB:October 07, 1981, 43 y.o., male Today's Date: 04/02/2024  END OF SESSION:    PT End of Session - 06/10/24 1609     Visit Number 7    Number of Visits 25    Date for PT Re-Evaluation 06/03/24    Authorization Type [employer]    PT Start Time 1610    PT Stop Time 1650    PT Time Calculation (min) 40 min    Activity Tolerance Patient tolerated treatment well;No increased pain    Behavior During Therapy [facility] for tasks assessed/performed          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   left cerebrum   Erectile dysfunction    Heart murmur    Hyperlipidemia    Schizophrenia (HCC)    Tobacco abuse    Past Surgical History:  Procedure Laterality Date   BOWEL RESECTION N/A 09/04/2022   Procedure: SMALL BOWEL RESECTION, open;  Surgeon: Jordis Laneta FALCON, MD;  Location: [facility];  Service: General;  Laterality: N/A;   IR CT HEAD LTD  03/21/2023   IR CT HEAD LTD  03/21/2023   IR PERCUTANEOUS ART THROMBECTOMY/INFUSION INTRACRANIAL INC DIAG ANGIO  03/21/2023   ORIF ORBITAL FRACTURE  1985   PARTIAL COLECTOMY Right 09/04/2022   Procedure: PARTIAL COLECTOMY;  Surgeon: Jordis Laneta FALCON, MD;  Location: [facility];  Service: General;  Laterality: Right;  Right colectimy   RADIOLOGY WITH ANESTHESIA N/A 03/21/2023   Procedure: RADIOLOGY WITH ANESTHESIA;  Surgeon: Radiologist, Medication, MD;  Location: [facility];  Service: Radiology;  Laterality: N/A;   TEE WITHOUT CARDIOVERSION N/A 07/19/2020   Procedure: TRANSESOPHAGEAL ECHOCARDIOGRAM (TEE);  Surgeon: Hester Wolm PARAS, MD;  Location: [facility];  Service: Cardiovascular;  Laterality: N/A;   WRIST SURGERY Left 11/07/2013   REPAIR ULNAR ARTERY, NERVE AND TENDON   Patient Active Problem List   Diagnosis Date Noted   Speech and language  deficits 04/22/2023   Hemiparesis affecting right side as late effect of cerebrovascular accident (CVA) (HCC) 04/22/2023   Spasticity 04/22/2023   Chronic diarrhea 04/22/2023   Chronic schizophrenia (HCC) 04/01/2023   S/P right colectomy 09/04/2022   Dental decay 06/12/2022   Erectile dysfunction 07/26/2021   Hyperlipidemia LDL goal <70 03/28/2021   History of embolic stroke 07/18/2020   Drug abuse (HCC) 07/17/2020   Major depressive disorder, recurrent episode, severe (HCC) 02/20/2015   Alcohol use disorder, severe, dependence (HCC) 02/20/2015   Stimulant use disorder 02/20/2015   Tobacco use disorder 02/20/2015   PCP: Toribio SQUIBB. [place], [place] REFERRING PROVIDER: Toribio SQUIBB. [place], [place] REFERRING DIAGNOSIS: [phone] (ICD-10-CM) - Hemiparesis affecting right side as late effect of cerebrovascular accident (CVA) (HCC)  THERAPY DIAG: Left middle cerebral artery stroke (HCC)  Muscle weakness (generalized)  Other lack of coordination  Unsteadiness on feet   ONSET DATE: 03/21/23 (ED to hospital admission, CVA)  RATIONALE FOR EVALUATION AND TREATMENT: Rehabilitation Subjective: Pt reports he has not been doing his exercises at home.  Pt otherwise would like to be seen for one more visit under the approval and then will be discharged.  Pertinent History  43 y.o. year old male with Hx of L MCA stroke 6/5-6/19; pt has a past medical history of Alcohol abuse, Depression, Drug abuse (HCC), Embolic stroke of left basal  ganglia (HCC) (07/17/2020), Erectile dysfunction, Heart murmur, Hyperlipidemia, Schizophrenia (HCC), and Tobacco abuse.  Patient reports that he has been losing his balance while he's walking. Tends to lose balance backwards. Difficulty expressing self due to aphasia.   Pt currently referred for R hemiparesis after stroke. Pt also has referrals for speech and OT.   Pain: No Numbness/Tingling: Yes - R forearm and hand  Focal Weakness: No Recent changes in overall  health/medication: No Prior history of physical therapy for balance:  No Falls: Has patient fallen in last 6 months? 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, goats) Red flags (bowel/bladder changes, saddle paresthesia, personal history of cancer, h/o spinal tumors, h/o compression fx, h/o abdominal aneurysm, abdominal pain, chills/fever, night sweats, nausea, vomiting, unrelenting pain): Negative  Precautions: None  Weight Bearing Restrictions: No  Living Environment Lives with: lives with mother  Lives in: Mobile home Has following equipment at home: shower chair   Patient Goals: to reduce falls and to improve balance    OBJECTIVE:   TODAY'S TREATMENT: 06/10/2024  Physical Performance Testing:  Activities-specific Balance Confidence Scale:  Score: 80% Increased risk of falls in community-dwelling, older adults <80% (79.89%)  0% = no confidence - 100% = complete confidence (ANPTA Core Set of Outcome Measures for Adults with Neurologic Conditions, 2018)  PT instructed pt in DGI. See below for results. Demonstrates no fall risk with score of 24/24. (<19 indicates increased fall risk)   TherAct:  Tandem stance, 30 sec bouts each LE leading SLS, 15 sec bout each LE Standing heel raise with 3 sec hold and eccentric lowering, x10 Standing toe raises, x10 Mini squats with UE support on rail, x10 each Lunge with single UE support on rail, x10 each LE leading Lateral step up and over onto 6 step, x10 each direction Braided sidestepping/karaoke/grapevine, 20' x1 each direction     PATIENT EDUCATION:  Education details: Discussed results of testing, goals of PT, cut-off scores for fall risk measures, plan of care. Discussed use of toe tapping drill on porch steps at home to work on standing coordination  and adequate clearing of toes with each step.  Person educated: Patient Education method: Explanation, Demonstration, and Handouts Education comprehension: verbalized understanding and returned demonstration   HOME EXERCISE PROGRAM:  Access Code: [email or web address] URL: [email or web address] Date: 06/10/2024 Prepared by: Sidra Simpers  Program Notes **Be sure to perform all exercises next to a countertop or sturdy piece of furniture in case you become unsteady.  Exercises - Tandem Stance in Corner  - 2 x daily - 7 x weekly - 1 sets - 10 reps - 30 hold - Single Leg Stance with Support  - 2 x daily - 7 x weekly - 1 sets - 10 reps - 10 hold - Romberg Stance  - 2 x daily - 7 x weekly - 1 sets - 10 reps - 60 hold - Standing Heel Raise with Support  - 1 x daily - 7 x weekly - 2 sets - 15 reps - Standing Toe Raises at Chair  - 1 x daily - 7 x weekly - 2 sets - 15 reps - Mini Squat with Counter Support  - 1 x daily - 7 x weekly - 2 sets - 15 reps - Lunge with Counter Support  - 1 x daily - 7 x weekly - 2 sets -  15 reps - Lateral Step Up  - 1 x daily - 7 x weekly - 2 sets - 2 reps - 30 hold - Braided Sidestepping  - 1 x daily - 7 x weekly - 2 sets - 10 reps   ASSESSMENT:  CLINICAL IMPRESSION:   Pt responded well to the exercises given as part of the HEP.  Pt has made significant progress in the goals assessed thus far and will be assess for the remaining at the next visit.  Pt's next visit will be the last due to insurance approval and pt making significant improvements over the past several weeks.  Pt encouraged to ask any remaining questions at the next visit.   Pt will continue to benefit from skilled therapy to address remaining deficits in order to improve overall QoL and return to PLOF.      REHAB POTENTIAL: Fair    CLINICAL DECISION MAKING: Evolving/moderate complexity  EVALUATION COMPLEXITY: Moderate   GOALS: Goals reviewed with patient? Yes  SHORT TERM GOALS: Target  date: 04/22/2024  Pt will be independent with HEP in order to improve strength and balance in order to decrease fall risk and improve function at home. Baseline: 5/21: initiated Goal status: Needs updating   LONG TERM GOALS: Target date: 06/03/2024  1.  Pt will improve BERG by at least 3 points in order to demonstrate clinically significant improvement in balance.   Baseline: 5/21: to be completed visit #2.   03/19/24: 54/56 Goal status: IMPOSSIBLE    2.  Pt will improve ABC by at least 13% in order to demonstrate clinically significant improvement in balance confidence.      Baseline: 5/21: 68.8%;  05/27/24: 63% 06/10/24: 80% Goal status: PROGRESSING  3. Pt will improve DGI by at least 3 points in order to demonstrate clinically significant improvement in balance and decreased risk for falls.     Baseline: 5/21: 20/24 06/10/24: 24/24 Goal status: MET  4. Pt will increase by at least 61m (172ft) in order to demonstrate clinically significant improvement in cardiopulmonary endurance and community ambulation   Baseline: 5/21: to be completed visit #2.   03/19/24: 1260 ft; 05/27/24: 1345ft  Goal status: IN PROGRESSION  3. Pt will improve MiniBEST by at least 4 points in order to demonstrate clinically significant improvement in balance and decreased risk for falls.     Baseline: 6/12: 20/28; 05/27/24: 25/28 Goal status: MET  PLAN: PT FREQUENCY: 1-2x/week  PT DURATION: 12 weeks  PLANNED INTERVENTIONS: Therapeutic exercises, Therapeutic activity, Neuromuscular re-education, Balance training, Gait training, Patient/Family education, Joint manipulation, Joint mobilization, Canalith repositioning, Aquatic Therapy, Dry Needling, Cognitive remediation, Electrical stimulation, Spinal manipulation, Spinal mobilization, Cryotherapy, Moist heat, Traction, Ultrasound, Ionotophoresis 4mg /ml Dexamethasone , and Manual therapy  PLAN FOR NEXT SESSION:   Assess goals and d/c.  Pt's last approved visit.   Assess compliance with last HEP and see if anything needs to be changed.    Fonda Simpers, PT, DPT Physical Therapist - [facility]  06/10/24, 5:26 PM

## 2024-06-13 NOTE — Therapy (Signed)
 OUTPATIENT OCCUPATIONAL THERAPY NEURO TREATMENT  Patient Name: Dionis Autry MRN: [id number] DOB:1981-04-18, 43 y.o., male Today's Date: 06/13/2024  PCP: Toribio Hoyle, PA REFERRING PROVIDER: Toribio Hoyle, PA  END OF SESSION:  OT End of Session - 06/13/24 1805     Visit Number 3    Number of Visits 24    Date for OT Re-Evaluation 07/14/24    OT Start Time 1445    OT Stop Time 1530    OT Time Calculation (min) 45 min    Activity Tolerance Patient tolerated treatment well    Behavior During Therapy [facility] for tasks assessed/performed          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   left cerebrum   Erectile dysfunction    Heart murmur    Hyperlipidemia    Schizophrenia (HCC)    Tobacco abuse    Past Surgical History:  Procedure Laterality Date   BOWEL RESECTION N/A 09/04/2022   Procedure: SMALL BOWEL RESECTION, open;  Surgeon: Jordis Laneta FALCON, MD;  Location: [facility] ORS;  Service: General;  Laterality: N/A;   IR CT HEAD LTD  03/21/2023   IR CT HEAD LTD  03/21/2023   IR PERCUTANEOUS ART THROMBECTOMY/INFUSION INTRACRANIAL INC DIAG ANGIO  03/21/2023   ORIF ORBITAL FRACTURE  1985   PARTIAL COLECTOMY Right 09/04/2022   Procedure: PARTIAL COLECTOMY;  Surgeon: Jordis Laneta FALCON, MD;  Location: [facility] ORS;  Service: General;  Laterality: Right;  Right colectimy   RADIOLOGY WITH ANESTHESIA N/A 03/21/2023   Procedure: RADIOLOGY WITH ANESTHESIA;  Surgeon: Radiologist, Medication, MD;  Location: [facility];  Service: Radiology;  Laterality: N/A;   TEE WITHOUT CARDIOVERSION N/A 07/19/2020   Procedure: TRANSESOPHAGEAL ECHOCARDIOGRAM (TEE);  Surgeon: Hester Wolm PARAS, MD;  Location: [facility] ORS;  Service: Cardiovascular;  Laterality: N/A;   WRIST SURGERY Left 11/07/2013   REPAIR ULNAR ARTERY, NERVE AND TENDON   Patient Active Problem List   Diagnosis Date Noted   Speech and language  deficits 04/22/2023   Hemiparesis affecting right side as late effect of cerebrovascular accident (CVA) (HCC) 04/22/2023   Spasticity 04/22/2023   Chronic diarrhea 04/22/2023   Chronic schizophrenia (HCC) 04/01/2023   S/P right colectomy 09/04/2022   Dental decay 06/12/2022   Erectile dysfunction 07/26/2021   Hyperlipidemia LDL goal <70 03/28/2021   History of embolic stroke 07/18/2020   Drug abuse (HCC) 07/17/2020   Major depressive disorder, recurrent episode, severe (HCC) 02/20/2015   Alcohol use disorder, severe, dependence (HCC) 02/20/2015   Stimulant use disorder 02/20/2015   Tobacco use disorder 02/20/2015   ONSET DATE: 03/20/24  REFERRING DIAG: Muscle weakness (generalized)  Other lack of coordination  Hemiparesis affecting right side as late effect of cerebrovascular accident (HCC)  THERAPY DIAG:  Muscle weakness (generalized)  Other lack of coordination  Hemiparesis affecting right side as late effect of cerebrovascular accident [facility])  Rationale for Evaluation and Treatment: Rehabilitation  SUBJECTIVE:  SUBJECTIVE STATEMENT: Pt reports doing well today.  Pt accompanied by: self  PERTINENT HISTORY: Per chart: Patient is a 43 year old right-handed male with PMH of alcohol/tobacco use, left basal ganglia embolic stroke in 2021 (no residual deficits), schizophrenia maintained on Abilify , Depakote , Lexapro  as well as Cogentin , hyperlipidemia, and 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 Presented 03/21/2023 to [facility] with acute onset of right-sided weakness facial droop and aphasia. Cranial CT scan worrisome for  acute infarct in the posterior left parietal lobe. CTA showed occlusion of 2 proximal left M2 branches and mild left M1 stenosis. Widely patent carotid arteries.  Patient transferred to [facility] for admission and underwent arteriogram with revascularization of occluded left MCA however it reoccluded. MRI of the brain showed interval increase in size of  acute/subacute nonhemorrhagic infarct involving the majority the left insular cortex and posterior left frontal operculum extending into the high posterior left parietal lobe. No acute hemorrhage or significant mass effect. Patient admitted to CIR on 03/27/23, discharged .   PRECAUTIONS: None  WEIGHT BEARING RESTRICTIONS: No  PAIN: 06/10/24: Pt reports no pain today Are you having pain? Yes: NPRS scale: 7/10 in the wrist, >7/10 in bilat feet  Pain location: both feet, R wrist  Pain description: unable  Aggravating factors: walking a lot,  Relieving factors: nothing   FALLS: Has patient fallen in last 6 months? Yes. Number of falls Pt estimates 2 times   LIVING ENVIRONMENT: Lives with: lives with their family Lives in: Mobile home Stairs: Yes: External: 1 steps; on right going up and on left going up Has following equipment at home: None  PLOF: Family assisted with some ADLs d/t psychiatric hx   PATIENT GOALS: I hope I get some more use out of my hand.   OBJECTIVE:  Note: Objective measures were completed at Evaluation unless otherwise noted.  HAND DOMINANCE: Right  ADLs: Overall ADLs: intermittent assist from mother Transfers/ambulation related to ADLs: indep Eating: using L non-dominant hand with utensils; unable to cut food  Grooming: using L non-dominant hand UB Dressing: difficulty with clothing fasteners LB Dressing: mom helps with tying draw string pants  Toileting:  using L non-dominant hand Bathing: pt reports he can use his R hand a little, but it's difficulty  Tub Shower transfers: indep Equipment: none  IADLs:  Shopping: supv Light housekeeping: mother assists  Meal Prep: mother currently manages, but pt reports he cooked prior to CVA Community mobility: Pt relies on friends and family for driving, but reports he was able to drive prior to his CVA Medication management: mom currently managing; pt reports he was able to manage them before his stroke  Financial  management: pt reports he has 1 bill (rent) and he pays his mom.  Handwriting: Difficulty grasping pen, scribbles for signature, but not at baseline.   POSTURE COMMENTS:  No Significant postural limitations  ACTIVITY TOLERANCE: Activity tolerance: Will continue to assess within functional contexts   UPPER EXTREMITY ROM:  BUEs WFL  UPPER EXTREMITY MMT:   R shoulder 4+/5, L 5/5  MMT Right eval Left eval  Shoulder flexion 4+ 5  Shoulder abduction 4+ 5  Shoulder adduction    Shoulder extension    Shoulder internal rotation    Shoulder external rotation    Middle trapezius    Lower trapezius    Elbow flexion 4+ 5  Elbow extension 4+ 5  Wrist flexion 4+ 5  Wrist extension 4+ 5  Wrist ulnar deviation    Wrist radial deviation    Wrist pronation    Wrist supination    (Blank rows = not tested)  HAND FUNCTION: Grip strength: Right: 26 lbs; Left: 106 lbs, Lateral pinch: Right: 15 lbs, Left: 20 lbs, and 3 point pinch: Right: 9 (fingers slipped) lbs, Left: 17 lbs  COORDINATION: Finger Nose Finger test: increased time/less precision with the RUE 9 Hole Peg test: Right: Placed 3 pegs in 2 min (stopped to limit frustration level) sec;  Left: 24 sec  SENSATION: R hand tingling/numbness dorsal/volar hand.    EDEMA: No visible swelling   MUSCLE TONE: RUE: Within functional limits  COGNITION: 0x4 Overall cognitive status: hx of schizophrenia, living with mother at baseline  VISION ASSESSMENT: WFL  PERCEPTION: Not tested  PRAXIS: Impaired: Motor planning, moderate-severe apraxia RUE  OBSERVATIONS:  Pt pleasant, cooperative, eager to improve use of his hand.                                                                                                      TREATMENT DATE: 06/10/24 Self Care: -Fork and knife skills practice with built up foam handles around fork and butter knife.  Pt practiced stabbing pieces of small putty on a plate using R hand, performing hand to near mouth  patterns, and bimanual cutting, knife in R hand to saw small pieces of putty.   -Issued 2 foam built up handles for home to place over eating utensils; pt able to return demo of placing and removing foam handles from a fork.  Therapeutic Activity: -Issued yellow theraputty and instructed pt in strengthening and coordination exercises for R hand, including gross grasping, lateral/2 point/3 point pinching, digit abd/add, and digging coins out of putty.  Able to return demo with max vc and demo for technique to improve quality of movement.  Advised pt continue to squeeze his stress ball at home and OT will keep theraputty in clinic d/t direct supv needed to avoid mess and perform exercises correctly.  PATIENT EDUCATION: Education details: Ecologist, theraputty Person educated: Patient Education method: Explanation and Verbal cues, demo Education comprehension: verbalized understanding and needs further education  HOME EXERCISE PROGRAM: R grip strengthening with stress ball   GOALS: Goals reviewed with patient? Yes  SHORT TERM GOALS: Target date: 06/02/24  Pt will perform HEP with min A or better for increasing RUE strength and coordination for daily tasks. Baseline: Eval: Not yet initiated  Goal status: INITIAL  LONG TERM GOALS: Target date: 07/14/24  Pt will increase grip strength by 20 or more lbs to enable ability to hold and carry heavier ADL supplies without dropping. Baseline: Eval: R 26; L 106 lbs Goal status: INITIAL  2.  Pt will increase R lateral pinch by 5 or more lbs to enable improved ability to stabilize eating utensils in R dominant hand. Baseline: Eval: R 15 lbs, L 20 lbs  Goal status: INITIAL  3.  Pt will increase R hand [facility] skills to enable indep with managing clothing fasteners.   Baseline: Eval: R: 9 hole placed 3 pegs in 2 min; L 24 sec (all 9 pegs); mother currently assists with clothing fasteners  Goal status: INITIAL  4.  Pt will increase RUE [facility]  skills to allow use of the R dominant arm for bathing at least 50% of body.  Baseline: Eval: Pt reports he primarily uses L non-dominant arm d/t difficulty bathing with the R  Goal status: INITIAL  ASSESSMENT:  CLINICAL IMPRESSION: Good participation in OT today for the first 30 min of session.  Pt required 1-2  vc for attention to task in the last 10 min after pt asked if he was done yet.  Putty will be kept in clinic as pt requires direct supv for technique and to avoid mess.  OT encouraged pt continue squeezing his stress ball at home for grip strengthening.  Pt attempted manipulation of fork in R hand without built up handle, but was unable to maintain grasp.  With built up foam handles added, pt was able to maintain grasp of fork while stabbing putty pieces.  Accuracy with stabbing fork to putty improved with reps, and pt demonstrated fairly good hand to mouth patterns.  Pt struggled with butter knife when attempting to cut through putty, and lacked awareness of necessary position for sawing motion and force modulation, primarily sawing too heavily into the plate when putty was not consistently beneath the knife.  OT issued built up foam handles for home and encouraged pt use for easier manipulation of fork/butter knife at home.  Pt. Continues to benefit from skilled OT to increase RUE strength and coordination in order to work towards return to PLOF and to better engage the RUE into ADL/IADL tasks, reduce burden of care on caregivers, and improve QOL.  PERFORMANCE DEFICITS: in functional skills including ADLs, IADLs, coordination, dexterity, proprioception, sensation, strength, pain, flexibility, Fine motor control, Gross motor control, mobility, balance, body mechanics, decreased knowledge of precautions, decreased knowledge of use of DME, vision, and UE functional use, cognitive skills including attention, memory, and temperament/personality, and psychosocial skills including coping strategies,  environmental adaptation, habits, and routines and behaviors.   IMPAIRMENTS: are limiting patient from ADLs, IADLs, education, leisure, and social participation.   CO-MORBIDITIES: has co-morbidities such as schizophrenia, L embolic CVA, drug abuse, depression that affects occupational performance. Patient will benefit from skilled OT to address above impairments and improve overall function.  MODIFICATION OR ASSISTANCE TO COMPLETE EVALUATION: Min-Moderate modification of tasks or assist with assess necessary to complete an evaluation.  OT OCCUPATIONAL PROFILE AND HISTORY: Detailed assessment: Review of records and additional review of physical, cognitive, psychosocial history related to current functional performance.  CLINICAL DECISION MAKING: Moderate - several treatment options, min-mod task modification necessary  REHAB POTENTIAL: Good  EVALUATION COMPLEXITY: Moderate    PLAN:  OT FREQUENCY: 2x/week  OT DURATION: 12 weeks  PLANNED INTERVENTIONS: 97168 OT Re-evaluation, 97535 self care/ADL training, 02889 therapeutic exercise, 97530 therapeutic activity, 97112 neuromuscular re-education, 97140 manual therapy, 97116 gait training, 02989 moist heat, 97010 cryotherapy, 97750 Physical Performance Testing, 02239 Orthotic Initial, 97763 Orthotic/Prosthetic subsequent, passive range of motion, balance training, functional mobility training, visual/perceptual remediation/compensation, psychosocial skills training, energy conservation, coping strategies training, patient/family education, and DME and/or AE instructions  RECOMMENDED OTHER SERVICES: None at this time  CONSULTED AND AGREED WITH PLAN OF CARE: Patient  PLAN FOR NEXT SESSION: see above  Inocente MARLA Blazing, OT  06/13/2024, 6:07 PM

## 2024-06-17 ENCOUNTER — Ambulatory Visit: Payer: MEDICAID | Admitting: Physical Therapy

## 2024-06-17 ENCOUNTER — Ambulatory Visit: Payer: MEDICAID

## 2024-06-17 DIAGNOSIS — M6281 Muscle weakness (generalized): Secondary | ICD-10-CM

## 2024-06-17 DIAGNOSIS — R2681 Unsteadiness on feet: Secondary | ICD-10-CM

## 2024-06-17 DIAGNOSIS — R471 Dysarthria and anarthria: Secondary | ICD-10-CM

## 2024-06-17 DIAGNOSIS — R4701 Aphasia: Secondary | ICD-10-CM

## 2024-06-17 DIAGNOSIS — R482 Apraxia: Secondary | ICD-10-CM

## 2024-06-17 DIAGNOSIS — R278 Other lack of coordination: Secondary | ICD-10-CM | POA: Diagnosis not present

## 2024-06-17 NOTE — Therapy (Signed)
 OUTPATIENT SPEECH LANGUAGE PATHOLOGY APHASIA TREATMENT   Patient Name: Brian Rios MRN: [id number] DOB:1981/06/22, 43 y.o., male Today's Date: 06/17/2024  PCP: Toribio Hoyle, PA REFERRING PROVIDER: Arthea Farrow, MD   End of Session - 04/28/24 1056     Visit Number 3   Number of Visits 12    Date for SLP Re-Evaluation 07/14/24    Authorization Type [employer]    Progress Note Due on Visit 10    SLP Start Time 1145   SLP Stop Time  1230   SLP Time Calculation (min) 35 min    Activity Tolerance Patient tolerated treatment well; however, requested to terminate session early          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   left cerebrum   Erectile dysfunction    Heart murmur    Hyperlipidemia    Schizophrenia (HCC)    Tobacco abuse    Past Surgical History:  Procedure Laterality Date   BOWEL RESECTION N/A 09/04/2022   Procedure: SMALL BOWEL RESECTION, open;  Surgeon: Jordis Laneta FALCON, MD;  Location: [facility] ORS;  Service: General;  Laterality: N/A;   IR CT HEAD LTD  03/21/2023   IR CT HEAD LTD  03/21/2023   IR PERCUTANEOUS ART THROMBECTOMY/INFUSION INTRACRANIAL INC DIAG ANGIO  03/21/2023   ORIF ORBITAL FRACTURE  1985   PARTIAL COLECTOMY Right 09/04/2022   Procedure: PARTIAL COLECTOMY;  Surgeon: Jordis Laneta FALCON, MD;  Location: [facility] ORS;  Service: General;  Laterality: Right;  Right colectimy   RADIOLOGY WITH ANESTHESIA N/A 03/21/2023   Procedure: RADIOLOGY WITH ANESTHESIA;  Surgeon: Radiologist, Medication, MD;  Location: [facility] OR;  Service: Radiology;  Laterality: N/A;   TEE WITHOUT CARDIOVERSION N/A 07/19/2020   Procedure: TRANSESOPHAGEAL ECHOCARDIOGRAM (TEE);  Surgeon: Hester Wolm PARAS, MD;  Location: [facility] ORS;  Service: Cardiovascular;  Laterality: N/A;   WRIST SURGERY Left 11/07/2013   REPAIR ULNAR ARTERY, NERVE AND TENDON   Patient Active  Problem List   Diagnosis Date Noted   Speech and language deficits 04/22/2023   Hemiparesis affecting right side as late effect of cerebrovascular accident (CVA) (HCC) 04/22/2023   Spasticity 04/22/2023   Chronic diarrhea 04/22/2023   Chronic schizophrenia (HCC) 04/01/2023   S/P right colectomy 09/04/2022   Dental decay 06/12/2022   Erectile dysfunction 07/26/2021   Hyperlipidemia LDL goal <70 03/28/2021   History of embolic stroke 07/18/2020   Drug abuse (HCC) 07/17/2020   Major depressive disorder, recurrent episode, severe (HCC) 02/20/2015   Alcohol use disorder, severe, dependence (HCC) 02/20/2015   Stimulant use disorder 02/20/2015   Tobacco use disorder 02/20/2015    ONSET DATE: 03/22/2023; referral date 04/10/24  REFERRING DIAG:  P30.648 (ICD-10-CM) - Hemiparesis affecting right side as late effect of cerebrovascular accident (CVA) (HCC)  Z86.73 (ICD-10-CM) - History of embolic stroke  Q19.0 (ICD-10-CM) - Speech and language deficits    THERAPY DIAG: aphasia, apraxia, dysarthria   Rationale for Evaluation and Treatment Rehabilitation  SUBJECTIVE:   SUBJECTIVE STATEMENT: Pt with alert, distracted Pt accompanied by: self  PERTINENT HISTORY and DIAGNOSTIC FINDINGS: Patient is a 43 year old right-handed male with PMH of alcohol/tobacco use, left basal ganglia embolic stroke in 2021 (no residual deficits), schizophrenia maintained on Abilify , Depakote , Lexapro  as well as Cogentin , hyperlipidemia, and 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 Presented [facility]/21/20[facility] to [facility] with acute onset of  right-sided weakness facial droop and aphasia. Cranial CT scan worrisome for acute infarct in the posterior left parietal lobe. CTA showed occlusion of 2 proximal left M2 branches and mild left M1 stenosis. Widely patent carotid arteries.  Patient transferred to [facility] for admission and underwent arteriogram with revascularization of occluded left MCA however it reoccluded. MRI of the  brain showed interval increase in size of acute/subacute nonhemorrhagic infarct involving the majority the left insular cortex and posterior left frontal operculum extending into the high posterior left parietal lobe. No acute hemorrhage or significant mass effect. Patient admitted to CIR on 03/27/23, discharged .  PAIN:  Are you having pain? No  FALLS: Has patient fallen in last 6 months?  No  LIVING ENVIRONMENT: Lives with: mother Lives in: Mobile home  PLOF:  Level of assistance: Independent with ADLs, Independent with IADLs    PATIENT GOALS    to get my speech right   OBJECTIVE:  TODAY'S TREATMENT:  Reviewed compensations for dysarthria and circumlocution/SFA for wordfinding. Handout provided. Min cueing to utilize compensations for dysarthria and anomia during naming to description task. Min-mod cueing to provide x4 details during SFA task.   PATIENT EDUCATION: Education details: as above Person educated: Patient Education method: Programmer, multimedia; Handout Education comprehension: needs further education  HOME EXERCISE PROGRAM:   Worksheets provided  Use of compensations    GOALS:  Goals reviewed with patient? Yes  SHORT TERM GOALS: Target date: 10 sessions  With Min A, patient will describe visual scenes using 3 or more sentences at 75% accuracy. Baseline: Goal status: INITIAL  2.  With Moderate A, patient will complete a semantic feature analysis with at least 3 relevant features for 4/5 target words to improve word-finding skills.    Baseline:  Goal status: INITIAL  3.  Pt will utilize compensatory strategies for dysarthria with min cueing for 8/10 sentences to improve speech intelligibility.  Baseline:  Goal status: INITIAL  LONG TERM GOALS: Target date: 12 weeks  With min A, pt will utilize compensatory strategies for anomia at the conversation level.  Baseline:  Goal status: INITIAL  2.  With min A, pt will utilize compensatory strategies for  dysarthria at the conversation level.   Baseline:  Goal status: INITIAL  3.  With Rare Min A, patient/family will demonstrate understanding of the following concepts: aphasia, spontaneous recovery, communication vs conversation, strengths/strategies to promote success, local resources by answering multiple choice questions with 75% accuracy when provided supported conversation in order to increase patient's participation in medical care.    Baseline:  Goal status: INITIAL  ASSESSMENT:  CLINICAL IMPRESSION: Patient is a 43 y.o. right handed male who was seen today for a speech language tx. 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 Pt's expressive communication is c/b short grammatically incomplete utterances d/t word finding deficits. Pt's speech intelligibility is reduced by imprecise articulation, low vocal intensity and fast rate of speech resulting in ~ 70% speech intelligibility at the sentence level. See details of tx above. Recommend course of ST targeting compensations and education for above mentioned deficits.  OBJECTIVE IMPAIRMENTS include awareness, expressive language, aphasia, apraxia, and dysarthria. These impairments are limiting patient from return to work, managing appointments, managing finances, ADLs/IADLs, and effectively communicating at home and in community. Factors affecting potential to achieve goals and functional outcome are co-morbidities, medical prognosis, previous level of function, severity of impairments, and insurance limits on number of visits. Patient will  benefit from skilled SLP services to address above impairments and improve overall function.  REHAB POTENTIAL: Good  PLAN: SLP FREQUENCY: 1x/week  SLP DURATION: 12 weeks  PLANNED INTERVENTIONS: Language facilitation, Cueing hierachy, Internal/external aids, Oral motor exercises, Multimodal communication approach, SLP instruction  and feedback, Compensatory strategies, and Patient/family education    Delon Bangs, M.S., CCC-SLP Speech-Language Pathologist [place] [facility] [phone] (ASCOM)

## 2024-06-17 NOTE — Addendum Note (Signed)
 Addended by: SILVER FONDA ORN on: 06/17/2024 11:25 AM   Modules accepted: Orders

## 2024-06-17 NOTE — Therapy (Signed)
 OUTPATIENT PHYSICAL THERAPY TREATMENT/ DISCHARGE THERAPY  Patient Name: Brian Rios MRN: [id number] DOB:11/06/1980, 43 y.o., male Today's Date: 04/02/2024  END OF SESSION:    PT End of Session - 06/17/24 1118     Visit Number 8    Number of Visits 25    Date for PT Re-Evaluation 06/03/24    Authorization Type [employer] plan    Progress Note Due on Visit 10    PT Start Time 1145    PT Stop Time 1223    PT Time Calculation (min) 38 min    Activity Tolerance Patient tolerated treatment well;No increased pain    Behavior During Therapy [facility] for tasks assessed/performed          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   left cerebrum   Erectile dysfunction    Heart murmur    Hyperlipidemia    Schizophrenia (HCC)    Tobacco abuse    Past Surgical History:  Procedure Laterality Date   BOWEL RESECTION N/A 09/04/2022   Procedure: SMALL BOWEL RESECTION, open;  Surgeon: Jordis Laneta FALCON, MD;  Location: [facility] ORS;  Service: General;  Laterality: N/A;   IR CT HEAD LTD  03/21/2023   IR CT HEAD LTD  03/21/2023   IR PERCUTANEOUS ART THROMBECTOMY/INFUSION INTRACRANIAL INC DIAG ANGIO  03/21/2023   ORIF ORBITAL FRACTURE  1985   PARTIAL COLECTOMY Right 09/04/2022   Procedure: PARTIAL COLECTOMY;  Surgeon: Jordis Laneta FALCON, MD;  Location: [facility] ORS;  Service: General;  Laterality: Right;  Right colectimy   RADIOLOGY WITH ANESTHESIA N/A 03/21/2023   Procedure: RADIOLOGY WITH ANESTHESIA;  Surgeon: Radiologist, Medication, MD;  Location: [facility];  Service: Radiology;  Laterality: N/A;   TEE WITHOUT CARDIOVERSION N/A 07/19/2020   Procedure: TRANSESOPHAGEAL ECHOCARDIOGRAM (TEE);  Surgeon: Hester Wolm PARAS, MD;  Location: [facility] ORS;  Service: Cardiovascular;  Laterality: N/A;   WRIST SURGERY Left 11/07/2013   REPAIR ULNAR ARTERY, NERVE AND TENDON   Patient Active Problem List    Diagnosis Date Noted   Speech and language deficits 04/22/2023   Hemiparesis affecting right side as late effect of cerebrovascular accident (CVA) (HCC) 04/22/2023   Spasticity 04/22/2023   Chronic diarrhea 04/22/2023   Chronic schizophrenia (HCC) 04/01/2023   S/P right colectomy 09/04/2022   Dental decay 06/12/2022   Erectile dysfunction 07/26/2021   Hyperlipidemia LDL goal <70 03/28/2021   History of embolic stroke 07/18/2020   Drug abuse (HCC) 07/17/2020   Major depressive disorder, recurrent episode, severe (HCC) 02/20/2015   Alcohol use disorder, severe, dependence (HCC) 02/20/2015   Stimulant use disorder 02/20/2015   Tobacco use disorder 02/20/2015   PCP: Toribio SQUIBB. [place], [place] REFERRING PROVIDER: Toribio SQUIBB. [place], [place] REFERRING DIAGNOSIS: I69.351 (ICD-10-CM) - Hemiparesis affecting right side as late effect of cerebrovascular accident (CVA) (HCC)  THERAPY DIAG: Muscle weakness (generalized)  Unsteadiness on feet   ONSET DATE: 03/21/23 (ED to hospital admission, CVA)  RATIONALE FOR EVALUATION AND TREATMENT: Rehabilitation Subjective: Patient reports no questions with his HEP was provided at last treatment session.  Has no concerns with discharge at this date. Pertinent History  43 y.o. 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  Patient reports that he has been losing his balance while he's walking. Tends to lose balance backwards. Difficulty expressing self due to aphasia.   Pt currently referred for R hemiparesis after stroke. Pt also has referrals for speech and OT.   Pain: No Numbness/Tingling: Yes - R forearm and hand  Focal Weakness: No Recent changes in overall health/medication: No Prior history of physical therapy for balance:   No Falls: Has patient fallen in last 6 months? 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, goats) Red flags (bowel/bladder changes, saddle paresthesia, personal history of cancer, h/o spinal tumors, h/o compression fx, h/o abdominal aneurysm, abdominal pain, chills/fever, night sweats, nausea, vomiting, unrelenting pain): Negative  Precautions: None  Weight Bearing Restrictions: No  Living Environment Lives with: lives with mother  Lives in: Mobile home Has following equipment at home: shower chair   Patient Goals: to reduce falls and to improve balance    OBJECTIVE:   TODAY'S TREATMENT: 06/17/2024  Physical Performance Test or Measurement: a  physical performance test(s) or measurement (eg,  musculoskeletal, functional capacity), with written report,  each 15 mins   6 Min Walk Test:  Instructed patient to ambulate as quickly and as safely as possible for 6 minutes using LRAD. Patient was allowed to take standing rest breaks without stopping the test, but if the patient required a sitting rest break the clock would be stopped and the test would be over.  Results: 1327 feet . Results indicate that the patient has reduced endurance with ambulation compared to age matched norms.  Age Matched Norms (in meters): 13-69 yo M: 50 F: 89, 76-79 yo M: 66 F: 471, 32-89 yo M: 417 F: 392 MDC: 58.21 meters (190.98 feet) or 50 meters (ANPTA Core Set of Outcome Measures for Adults with Neurologic Conditions, 2018)  TherAct:  Tandem stance, 30 sec bouts each LE leading SLS, 15 sec bout each LE Standing heel raise with 3 sec hold and eccentric lowering, x10 Standing toe raises, x10 Mini squats with UE support on rail, x10 each Lunge with single UE support on rail, x10 each LE leading Lateral step up and over onto 6  step, x10 each direction Braided sidestepping/karaoke/grapevine, 20' x1 each direction  Pt educated throughout session about proper posture and technique with exercises. Improved exercise technique, movement at target joints, use of target muscles after min to mod verbal, visual, tactile cues.    PATIENT EDUCATION:  Education details: Discussed results of testing, goals of PT, cut-off scores for fall risk measures, plan of care. Discussed use of toe tapping drill on porch steps at home to work on standing coordination and adequate clearing of toes with each step.  Person educated: Patient Education method: Explanation, Demonstration, and Handouts Education comprehension: verbalized understanding and returned demonstration   HOME EXERCISE PROGRAM:  Access Code: [email or web address] URL: [email or web address] Date: 06/10/2024 Prepared by: Sidra Simpers  Program Notes **Be sure to perform all exercises next to a countertop or sturdy piece of furniture in case you become unsteady.  Exercises - Tandem Stance in Corner  - 2 x daily - 7 x weekly - 1 sets - 10 reps - 30 hold - Single Leg Stance with Support  - 2 x daily - 7 x weekly - 1 sets - 10 reps - 10 hold - Romberg Stance  - 2  x daily - 7 x weekly - 1 sets - 10 reps - 60 hold - Standing Heel Raise with Support  - 1 x daily - 7 x weekly - 2 sets - 15 reps - Standing Toe Raises at Chair  - 1 x daily - 7 x weekly - 2 sets - 15 reps - Mini Squat with Counter Support  - 1 x daily - 7 x weekly - 2 sets - 15 reps - Lunge with Counter Support  - 1 x daily - 7 x weekly - 2 sets - 15 reps - Lateral Step Up  - 1 x daily - 7 x weekly - 2 sets - 2 reps - 30 hold - Braided Sidestepping  - 1 x daily - 7 x weekly - 2 sets - 10 reps   ASSESSMENT:  CLINICAL IMPRESSION:   Patient arrived with good motivation for completion of pt activities.  Physical therapy consisted of completing last 6-minute walk test as well as assessing patient for  discharge.  Patient walked through all of his home exercises he was introduced to last time still required some cues for upper performance but overall completed them safely and without any incident.  Patient to be formally discharged this date.   REHAB POTENTIAL: Fair    CLINICAL DECISION MAKING: Evolving/moderate complexity  EVALUATION COMPLEXITY: Moderate   GOALS: Goals reviewed with patient? Yes  SHORT TERM GOALS: Target date: 04/22/2024  Pt will be independent with HEP in order to improve strength and balance in order to decrease fall risk and improve function at home. Baseline: 5/21: initiated Goal status: Needs updating   LONG TERM GOALS: Target date: 06/17/24  1.  Pt will improve BERG by at least 3 points in order to demonstrate clinically significant improvement in balance.   Baseline: 5/21: to be completed visit #2.   03/19/24: 54/56 Goal status: IMPOSSIBLE    2.  Pt will improve ABC by at least 13% in order to demonstrate clinically significant improvement in balance confidence.      Baseline: 5/21: 68.8%;  05/27/24: 63% 06/10/24: 80% Goal status: NEARLY MET  3. Pt will improve DGI by at least 3 points in order to demonstrate clinically significant improvement in balance and decreased risk for falls.     Baseline: 5/21: 20/24 06/10/24: 24/24 Goal status: MET  4. Pt will increase by at least 46m (179ft) in order to demonstrate clinically significant improvement in cardiopulmonary endurance and community ambulation   Baseline: 5/21: to be completed visit #2.   03/19/24: 1260 ft; 05/27/24: 1321ft 8/27: 1327 ft  NOT MET, FUNCTIONALLY IMPROVED  3. Pt will improve MiniBEST by at least 4 points in order to demonstrate clinically significant improvement in balance and decreased risk for falls.     Baseline: 6/12: 20/28; 05/27/24: 25/28 Goal status: MET  PLAN: PT FREQUENCY: 1-2x/week  PT DURATION: 12 weeks  PLANNED INTERVENTIONS: Therapeutic exercises, Therapeutic activity,  Neuromuscular re-education, Balance training, Gait training, Patient/Family education, Joint manipulation, Joint mobilization, Canalith repositioning, Aquatic Therapy, Dry Needling, Cognitive remediation, Electrical stimulation, Spinal manipulation, Spinal mobilization, Cryotherapy, Moist heat, Traction, Ultrasound, Ionotophoresis 4mg /ml Dexamethasone , and Manual therapy  PLAN FOR NEXT SESSION:   D/C today   Note: Portions of this document were prepared using Dragon voice recognition software and although reviewed may contain unintentional dictation errors in syntax, grammar, or spelling.  Lonni KATHEE Gainer PT ,DPT Physical Therapist- [facility]   06/17/24, 5:18 PM

## 2024-06-24 ENCOUNTER — Ambulatory Visit: Payer: MEDICAID | Attending: Physician Assistant

## 2024-06-24 ENCOUNTER — Ambulatory Visit: Payer: MEDICAID | Admitting: Physical Therapy

## 2024-06-24 DIAGNOSIS — R482 Apraxia: Secondary | ICD-10-CM | POA: Diagnosis present

## 2024-06-24 DIAGNOSIS — M6281 Muscle weakness (generalized): Secondary | ICD-10-CM | POA: Diagnosis present

## 2024-06-24 DIAGNOSIS — R4701 Aphasia: Secondary | ICD-10-CM | POA: Diagnosis present

## 2024-06-24 NOTE — Therapy (Signed)
 OUTPATIENT SPEECH LANGUAGE PATHOLOGY APHASIA TREATMENT   Patient Name: Brian Rios MRN: [id number] DOB:1981/08/31, 43 y.o., male Today's Date: 06/24/2024  PCP: Toribio Hoyle, [place] REFERRING PROVIDER: Arthea Farrow, MD   End of Session - 04/28/24 1056     Visit Number 3   Number of Visits 12    Date for SLP Re-Evaluation 07/14/24    Authorization Type [employer]    Progress Note Due on Visit 10    SLP Start Time 1145   SLP Stop Time  1230   SLP Time Calculation (min) 35 min    Activity Tolerance Patient tolerated treatment well; however, requested to terminate session early          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   left cerebrum   Erectile dysfunction    Heart murmur    Hyperlipidemia    Schizophrenia (HCC)    Tobacco abuse    Past Surgical History:  Procedure Laterality Date   BOWEL RESECTION N/A 09/04/2022   Procedure: SMALL BOWEL RESECTION, open;  Surgeon: Jordis Laneta FALCON, MD;  Location: [facility] OR[facility];  Service: General;  Laterality: N/A;   IR CT HEAD LTD  03/21/2023   IR CT HEAD LTD  03/21/2023   IR PERCUTANEOUS ART THROMBECTOMY/INFUSION INTRACRANIAL INC DIAG ANGIO  03/21/2023   ORIF ORBITAL FRACTURE  1985   PARTIAL COLECTOMY Right 09/04/2022   Procedure: PARTIAL COLECTOMY;  Surgeon: Jordis Laneta FALCON, MD;  Location: [facility] ORS;  Service: General;  Laterality: Right;  Right colectimy   RADIOLOGY WITH ANESTHESIA N/A 03/21/2023   Procedure: RADIOLOGY WITH ANESTHESIA;  Surgeon: Radiologist, Medication, MD;  Location: [facility] OR;  Service: Radiology;  Laterality: N/A;   TEE WITHOUT CARDIOVERSION N/A 07/19/2020   Procedure: TRANSESOPHAGEAL ECHOCARDIOGRAM (TEE);  Surgeon: Hester Wolm PARAS, MD;  Location: [facility] ORS;  Service: Cardiovascular;  Laterality: N/A;   WRIST SURGERY Left 11/07/2013   REPAIR ULNAR ARTERY, NERVE AND TENDON   Patient Active  Problem List   Diagnosis Date Noted   Speech and language deficits 04/22/2023   Hemiparesis affecting right side as late effect of cerebrovascular accident (CVA) (HCC) 04/22/2023   Spasticity 04/22/2023   Chronic diarrhea 04/22/2023   Chronic schizophrenia (HCC) 04/01/2023   S/P right colectomy 09/04/2022   Dental decay 06/12/2022   Erectile dysfunction 07/26/2021   Hyperlipidemia LDL goal <70 03/28/2021   History of embolic stroke 07/18/2020   Drug abuse (HCC) 07/17/2020   Major depressive disorder, recurrent episode, severe (HCC) 02/20/2015   Alcohol use disorder, severe, dependence (HCC) 02/20/2015   Stimulant use disorder 02/20/2015   Tobacco use disorder 02/20/2015    ONSET DATE: 03/22/2023; referral date 04/10/24  REFERRING DIAG:  P30.648 (ICD-10-CM) - Hemiparesis affecting right side as late effect of cerebrovascular accident (CVA) (HCC)  Z86.73 (ICD-10-CM) - History of embolic stroke  Q19.0 (ICD-10-CM) - Speech and language deficits    THERAPY DIAG: aphasia, apraxia, dysarthria   Rationale for Evaluation and Treatment Rehabilitation  SUBJECTIVE:   SUBJECTIVE STATEMENT: Pt with alert, distracted Pt accompanied by: self  PERTINENT HISTORY and DIAGNOSTIC FINDINGS: Patient is a 43 year old right-handed male with PMH of alcohol/tobacco use, left basal ganglia embolic stroke in 2021 (no residual deficits), schizophrenia maintained on Abilify , Depakote , Lexapro  as well as Cogentin , hyperlipidemia, and 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 Presented 03/21/2023 to [facility] with acute onset of  right-sided weakness facial droop and aphasia. Cranial CT scan worrisome for acute infarct in the posterior left parietal lobe. CTA showed occlusion of 2 proximal left M2 branches and mild left M1 stenosis. Widely patent carotid arteries.  Patient transferred to [facility] for admission and underwent arteriogram with revascularization of occluded left MCA however it reoccluded. MRI of the  brain showed interval increase in size of acute/subacute nonhemorrhagic infarct involving the majority the left insular cortex and posterior left frontal operculum extending into the high posterior left parietal lobe. No acute hemorrhage or significant mass effect. Patient admitted to [facility] on 03/27/23, discharged .  PAIN:  Are you having pain? No  FALLS: Has patient fallen in last 6 months?  No  LIVING ENVIRONMENT: Lives with: mother Lives in: Mobile home  PLOF:  Level of assistance: Independent with ADLs, Independent with IADLs    PATIENT GOALS    to get my speech right   OBJECTIVE:  TODAY'S TREATMENT:  Reviewed compensations for dysarthria and circumlocution/SFA for wordfinding. Min cueing to utilize compensations for dysarthria and anomia during naming to description task. Min-mod cueing to provide x4 details during SFA task. Pt repeated phrases of increasing length (3-7 words) with increased cueing needed with increased length (min-max).   PATIENT EDUCATION: Education details: as above Person educated: Patient Education method: Programmer, multimedia; Handout Education comprehension: needs further education  HOME EXERCISE PROGRAM:   Worksheets provided  Use of compensations    GOALS:  Goals reviewed with patient? Yes  SHORT TERM GOALS: Target date: 10 sessions  With Min A, patient will describe visual scenes using 3 or more sentences at 75% accuracy. Baseline: Goal status: INITIAL  2.  With Moderate A, patient will complete a semantic feature analysis with at least 3 relevant features for 4/5 target words to improve word-finding skills.    Baseline:  Goal status: INITIAL  3.  Pt will utilize compensatory strategies for dysarthria with min cueing for 8/10 sentences to improve speech intelligibility.  Baseline:  Goal status: INITIAL  LONG TERM GOALS: Target date: 12 weeks  With min A, pt will utilize compensatory strategies for anomia at the conversation level.   Baseline:  Goal status: INITIAL  2.  With min A, pt will utilize compensatory strategies for dysarthria at the conversation level.   Baseline:  Goal status: INITIAL  3.  With Rare Min A, patient/family will demonstrate understanding of the following concepts: aphasia, spontaneous recovery, communication vs conversation, strengths/strategies to promote success, local resources by answering multiple choice questions with 75% accuracy when provided supported conversation in order to increase patient's participation in medical care.    Baseline:  Goal status: INITIAL  ASSESSMENT:  CLINICAL IMPRESSION: Patient is a 43 y.o. right handed male who was seen today for a speech language tx. 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 Pt's expressive communication is c/b short grammatically incomplete utterances d/t word finding deficits. Pt's speech intelligibility is reduced by imprecise articulation, low vocal intensity and fast rate of speech resulting in ~ 70% speech intelligibility at the sentence level. See details of tx above. Recommend course of ST targeting compensations and education for above mentioned deficits.  OBJECTIVE IMPAIRMENTS include awareness, expressive language, aphasia, apraxia, and dysarthria. These impairments are limiting patient from return to work, managing appointments, managing finances, ADLs/IADLs, and effectively communicating at home and in community. Factors affecting potential to achieve goals and functional outcome are co-morbidities, medical prognosis, previous level  of function, severity of impairments, and insurance limits on number of visits. Patient will benefit from skilled SLP services to address above impairments and improve overall function.  REHAB POTENTIAL: Good  PLAN: SLP FREQUENCY: 1x/week  SLP DURATION: 12 weeks  PLANNED INTERVENTIONS: Language facilitation, Cueing hierachy,  Internal/external aids, Oral motor exercises, Multimodal communication approach, SLP instruction and feedback, Compensatory strategies, and Patient/family education    Delon Bangs, M.S., CCC-SLP Speech-Language Pathologist [facility] [phone] ([facility]COM)

## 2024-07-01 ENCOUNTER — Ambulatory Visit: Payer: MEDICAID | Admitting: Physical Therapy

## 2024-07-01 ENCOUNTER — Ambulatory Visit: Payer: MEDICAID

## 2024-07-01 DIAGNOSIS — R4701 Aphasia: Secondary | ICD-10-CM | POA: Diagnosis not present

## 2024-07-01 DIAGNOSIS — R482 Apraxia: Secondary | ICD-10-CM

## 2024-07-01 NOTE — Therapy (Signed)
 OUTPATIENT SPEECH LANGUAGE PATHOLOGY APHASIA TREATMENT   Patient Name: Brian Rios MRN: [id number] DOB:Mar 19, 1981, 43 y.o., male Today's Date: 07/01/2024  PCP: Toribio Hoyle, PA REFERRING PROVIDER: Arthea Farrow, MD   End of Session - 04/28/24 1056     Visit Number 3   Number of Visits 12    Date for SLP Re-Evaluation 07/14/24    Authorization Type [employer]    Progress Note Due on Visit 10    SLP Start Time 1145   SLP Stop Time  1230   SLP Time Calculation (min) 35 min    Activity Tolerance Patient tolerated treatment well; however, requested to terminate session early          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   left cerebrum   Erectile dysfunction    Heart murmur    Hyperlipidemia    Schizophrenia (HCC)    Tobacco abuse    Past Surgical History:  Procedure Laterality Date   BOWEL RESECTION N/A 09/04/2022   Procedure: SMALL BOWEL RESECTION, open;  Surgeon: Jordis Laneta FALCON, MD;  Location: [facility] ORS;  Service: General;  Laterality: N/A;   IR CT HEAD LTD  03/21/2023   IR CT HEAD LTD  03/21/2023   IR PERCUTANEOUS ART THROMBECTOMY/INFUSION INTRACRANIAL INC DIAG ANGIO  03/21/2023   ORIF ORBITAL FRACTURE  1985   PARTIAL COLECTOMY Right 09/04/2022   Procedure: PARTIAL COLECTOMY;  Surgeon: Jordis Laneta FALCON, MD;  Location: [facility] ORS;  Service: General;  Laterality: Right;  Right colectimy   RADIOLOGY WITH ANESTHESIA N/A 03/21/2023   Procedure: RADIOLOGY WITH ANESTHESIA;  Surgeon: Radiologist, Medication, MD;  Location: [facility];  Service: Radiology;  Laterality: N/A;   TEE WITHOUT CARDIOVERSION N/A 07/19/2020   Procedure: TRANSESOPHAGEAL ECHOCARDIOGRAM (TEE);  Surgeon: Hester Wolm PARAS, MD;  Location: [facility] ORS;  Service: Cardiovascular;  Laterality: N/A;   WRIST SURGERY Left 11/07/2013   REPAIR ULNAR ARTERY, NERVE AND TENDON   Patient Active  Problem List   Diagnosis Date Noted   Speech and language deficits 04/22/2023   Hemiparesis affecting right side as late effect of cerebrovascular accident (CVA) (HCC) 04/22/2023   Spasticity 04/22/2023   Chronic diarrhea 04/22/2023   Chronic schizophrenia (HCC) 04/01/2023   S/P right colectomy 09/04/2022   Dental decay 06/12/2022   Erectile dysfunction 07/26/2021   Hyperlipidemia LDL goal <70 03/28/2021   History of embolic stroke 07/18/2020   Drug abuse (HCC) 07/17/2020   Major depressive disorder, recurrent episode, severe (HCC) 02/20/2015   Alcohol use disorder, severe, dependence (HCC) 02/20/2015   Stimulant use disorder 02/20/2015   Tobacco use disorder 02/20/2015    ONSET DATE: 03/22/2023; referral date 04/10/24  REFERRING DIAG:  P30.648 (ICD-10-CM) - Hemiparesis affecting right side as late effect of cerebrovascular accident (CVA) (HCC)  Z86.73 (ICD-10-CM) - History of embolic stroke  Q19.0 (ICD-10-CM) - Speech and language deficits    THERAPY DIAG: aphasia, apraxia, dysarthria   Rationale for Evaluation and Treatment Rehabilitation  SUBJECTIVE:   SUBJECTIVE STATEMENT: Pt with alert, distracted Pt accompanied by: self  PERTINENT HISTORY and DIAGNOSTIC FINDINGS: Patient is a 44 year old right-handed male with PMH of alcohol/tobacco use, left basal ganglia embolic stroke in 2021 (no residual deficits), schizophrenia maintained on Abilify , Depakote , Lexapro  as well as Cogentin , hyperlipidemia, and 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 Presented 03/21/2023 to [facility] with acute onset of  right-sided weakness facial droop and aphasia. Cranial CT scan worrisome for acute infarct in the posterior left parietal lobe. CTA showed occlusion of 2 proximal left M2 branches and mild left M1 stenosis. Widely patent carotid arteries.  Patient transferred to [facility] for admission and underwent arteriogram with revascularization of occluded left MCA however it reoccluded. MRI of the  brain showed interval increase in size of acute/subacute nonhemorrhagic infarct involving the majority the left insular cortex and posterior left frontal operculum extending into the high posterior left parietal lobe. No acute hemorrhage or significant mass effect. Patient admitted to CIR on 03/27/23, discharged .  PAIN:  Are you having pain? No  FALLS: Has patient fallen in last 6 months?  No  LIVING ENVIRONMENT: Lives with: mother Lives in: Mobile home  PLOF:  Level of assistance: Independent with ADLs, Independent with IADLs    PATIENT GOALS    to get my speech right   OBJECTIVE:  TODAY'S TREATMENT:  Reviewed compensations for dysarthria and circumlocution/SFA for wordfinding. Min cueing to utilize compensations for dysarthria and anomia while labeling pictures and during divergent naming task. Reduced carry-over to conversational speech, although improving.   PATIENT EDUCATION: Education details: as above Person educated: Patient Education method: Programmer, multimedia; Handout Education comprehension: needs further education  HOME EXERCISE PROGRAM:   Worksheets provided  Use of compensations    GOALS:  Goals reviewed with patient? Yes  SHORT TERM GOALS: Target date: 10 sessions  With Min A, patient will describe visual scenes using 3 or more sentences at 75% accuracy. Baseline: Goal status: INITIAL  2.  With Moderate A, patient will complete a semantic feature analysis with at least 3 relevant features for 4/5 target words to improve word-finding skills.    Baseline:  Goal status: INITIAL  3.  Pt will utilize compensatory strategies for dysarthria with min cueing for 8/10 sentences to improve speech intelligibility.  Baseline:  Goal status: INITIAL  LONG TERM GOALS: Target date: 12 weeks  With min A, pt will utilize compensatory strategies for anomia at the conversation level.  Baseline:  Goal status: INITIAL  2.  With min A, pt will utilize compensatory  strategies for dysarthria at the conversation level.   Baseline:  Goal status: INITIAL  3.  With Rare Min A, patient/family will demonstrate understanding of the following concepts: aphasia, spontaneous recovery, communication vs conversation, strengths/strategies to promote success, local resources by answering multiple choice questions with 75% accuracy when provided supported conversation in order to increase patient's participation in medical care.    Baseline:  Goal status: INITIAL  ASSESSMENT:  CLINICAL IMPRESSION: Patient is a 43 y.o. right handed male who was seen today for a speech language tx. 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 Pt's expressive communication is c/b short grammatically incomplete utterances d/t word finding deficits. Pt's speech intelligibility is reduced by imprecise articulation, low vocal intensity and fast rate of speech resulting in ~ 70% speech intelligibility at the sentence level. See details of tx above. Recommend course of ST targeting compensations and education for above mentioned deficits.  OBJECTIVE IMPAIRMENTS include awareness, expressive language, aphasia, apraxia, and dysarthria. These impairments are limiting patient from return to work, managing appointments, managing finances, ADLs/IADLs, and effectively communicating at home and in community. Factors affecting potential to achieve goals and functional outcome are co-morbidities, medical prognosis, previous level of function, severity of impairments, and insurance limits on number of visits. Patient will benefit  from skilled SLP services to address above impairments and improve overall function.  REHAB POTENTIAL: Good  PLAN: SLP FREQUENCY: 1x/week  SLP DURATION: 12 weeks  PLANNED INTERVENTIONS: Language facilitation, Cueing hierachy, Internal/external aids, Oral motor exercises, Multimodal communication approach, SLP  instruction and feedback, Compensatory strategies, and Patient/family education    Delon Bangs, M.S., CCC-SLP Speech-Language Pathologist [place] [facility] [phone] (ASCOM)

## 2024-07-15 ENCOUNTER — Ambulatory Visit: Payer: MEDICAID | Admitting: Physician Assistant

## 2024-07-15 ENCOUNTER — Ambulatory Visit: Payer: MEDICAID | Admitting: Occupational Therapy

## 2024-07-15 ENCOUNTER — Ambulatory Visit: Payer: MEDICAID

## 2024-07-15 DIAGNOSIS — M6281 Muscle weakness (generalized): Secondary | ICD-10-CM

## 2024-07-15 DIAGNOSIS — R4701 Aphasia: Secondary | ICD-10-CM | POA: Diagnosis not present

## 2024-07-15 DIAGNOSIS — R482 Apraxia: Secondary | ICD-10-CM

## 2024-07-15 NOTE — Therapy (Signed)
 OUTPATIENT OCCUPATIONAL THERAPY NEURO TREATMENT/RECERTIFICATION NOTE  Patient Name: Brian Rios MRN: [id number] DOB:07-28-1981, 43 y.o., male Today's Date: 07/15/2024  PCP: Brian Hoyle, PA REFERRING PROVIDER: Toribio Hoyle, PA  END OF SESSION:  OT End of Session - 07/15/24 1407     Visit Number 4    Number of Visits 24    Date for Recertification  10/07/24    OT Start Time 1400    OT Stop Time 1445    OT Time Calculation (min) 45 min    Activity Tolerance Patient tolerated treatment well    Behavior During Therapy [facility] for tasks assessed/performed          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   left cerebrum   Erectile dysfunction    Heart murmur    Hyperlipidemia    Schizophrenia (HCC)    Tobacco abuse    Past Surgical History:  Procedure Laterality Date   BOWEL RESECTION N/A 09/04/2022   Procedure: SMALL BOWEL RESECTION, open;  Surgeon: Jordis Laneta FALCON, MD;  Location: [facility] ORS;  Service: General;  Laterality: N/A;   IR CT HEAD LTD  03/21/2023   IR CT HEAD LTD  03/21/2023   IR PERCUTANEOUS ART THROMBECTOMY/INFUSION INTRACRANIAL INC DIAG ANGIO  03/21/2023   ORIF ORBITAL FRACTURE  1985   PARTIAL COLECTOMY Right 09/04/2022   Procedure: PARTIAL COLECTOMY;  Surgeon: Jordis Laneta FALCON, MD;  Location: [facility] ORS;  Service: General;  Laterality: Right;  Right colectimy   RADIOLOGY WITH ANESTHESIA N/A 03/21/2023   Procedure: RADIOLOGY WITH ANESTHESIA;  Surgeon: Radiologist, Medication, MD;  Location: [facility] OR;  Service: Radiology;  Laterality: N/A;   TEE WITHOUT CARDIOVERSION N/A 07/19/2020   Procedure: TRANSESOPHAGEAL ECHOCARDIOGRAM (TEE);  Surgeon: Hester Wolm PARAS, MD;  Location: [facility] ORS;  Service: Cardiovascular;  Laterality: N/A;   WRIST SURGERY Left 11/07/2013   REPAIR ULNAR ARTERY, NERVE AND TENDON   Patient Active Problem List   Diagnosis Date Noted    Speech and language deficits 04/22/2023   Hemiparesis affecting right side as late effect of cerebrovascular accident (CVA) (HCC) 04/22/2023   Spasticity 04/22/2023   Chronic diarrhea 04/22/2023   Chronic schizophrenia (HCC) 04/01/2023   S/P right colectomy 09/04/2022   Dental decay 06/12/2022   Erectile dysfunction 07/26/2021   Hyperlipidemia LDL goal <70 03/28/2021   History of embolic stroke 07/18/2020   Drug abuse (HCC) 07/17/2020   Major depressive disorder, recurrent episode, severe (HCC) 02/20/2015   Alcohol use disorder, severe, dependence (HCC) 02/20/2015   Stimulant use disorder 02/20/2015   Tobacco use disorder 02/20/2015   ONSET DATE: 03/20/24  REFERRING DIAG: Muscle weakness (generalized)  Other lack of coordination  Hemiparesis affecting right side as late effect of cerebrovascular accident (HCC)  THERAPY DIAG:  Muscle weakness (generalized)  Rationale for Evaluation and Treatment: Rehabilitation  SUBJECTIVE:  SUBJECTIVE STATEMENT: Pt reports doing well today.  Pt accompanied by: self  PERTINENT HISTORY: Per chart: Patient is a 43 year old right-handed male with PMH of alcohol/tobacco use, left basal ganglia embolic stroke in 2021 (no residual deficits), schizophrenia maintained on Abilify , Depakote , Lexapro  as well as Cogentin , hyperlipidemia, and 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 Presented 03/21/2023 to [facility] with acute onset of right-sided weakness facial droop and aphasia. Cranial CT scan worrisome for acute infarct in the posterior left parietal lobe. CTA showed occlusion of 2 proximal left M2  branches and mild left M1 stenosis. Widely patent carotid arteries.  Patient transferred to [facility] for admission and underwent arteriogram with revascularization of occluded left MCA however it reoccluded. MRI of the brain showed interval increase in size of acute/subacute nonhemorrhagic infarct involving the majority the left insular cortex and posterior left  frontal operculum extending into the high posterior left parietal lobe. No acute hemorrhage or significant mass effect. Patient admitted to CIR on 03/27/23, discharged .   PRECAUTIONS: None  WEIGHT BEARING RESTRICTIONS: No  PAIN: 06/10/24: Pt reports no pain today Are you having pain? Yes: NPRS scale: 7/10 in the wrist, >7/10 in bilat feet  Pain location: both feet, R wrist  Pain description: unable  Aggravating factors: walking a lot,  Relieving factors: nothing   FALLS: Has patient fallen in last 6 months? Yes. Number of falls Pt estimates 2 times   LIVING ENVIRONMENT: Lives with: lives with their family Lives in: Mobile home Stairs: Yes: External: 1 steps; on right going up and on left going up Has following equipment at home: None  PLOF: Family assisted with some ADLs d/t psychiatric hx   PATIENT GOALS: I hope I get some more use out of my hand.   OBJECTIVE:  Note: Objective measures were completed at Evaluation unless otherwise noted.  HAND DOMINANCE: Right  ADLs: Overall ADLs: intermittent assist from mother Transfers/ambulation related to ADLs: indep Eating: using L non-dominant hand with utensils; unable to cut food  Grooming: using L non-dominant hand UB Dressing: difficulty with clothing fasteners LB Dressing: mom helps with tying draw string pants  Toileting:  using L non-dominant hand Bathing: pt reports he can use his R hand a little, but it's difficulty  Tub Shower transfers: indep Equipment: none  IADLs:  Shopping: supv Light housekeeping: mother assists  Meal Prep: mother currently manages, but pt reports he cooked prior to CVA Community mobility: Pt relies on friends and family for driving, but reports he was able to drive prior to his CVA Medication management: mom currently managing; pt reports he was able to manage them before his stroke  Financial management: pt reports he has 1 bill (rent) and he pays his mom.  Handwriting: Difficulty grasping  pen, scribbles for signature, but not at baseline.   POSTURE COMMENTS:  No Significant postural limitations  ACTIVITY TOLERANCE: Activity tolerance: Will continue to assess within functional contexts   UPPER EXTREMITY ROM:  BUEs WFL  UPPER EXTREMITY MMT:   R shoulder 4+/5, L 5/5  MMT Right eval Right  07/15/24  Left eval  Shoulder flexion 4+ 4+/5  5  Shoulder abduction 4+ 4+/5  5  Shoulder adduction      Shoulder extension      Shoulder internal rotation      Shoulder external rotation      Middle trapezius      Lower trapezius      Elbow flexion 4+ 4+/5  5  Elbow extension 4+ 4+/5  5  Wrist flexion 4+ 4+/5  5  Wrist extension 4+ 4+/5  5  Wrist ulnar deviation      Wrist radial deviation      Wrist pronation      Wrist supination      (Blank rows = not tested)  HAND FUNCTION: Grip strength: Right: 26 lbs; Left: 106 lbs, Lateral pinch: Right: 15 lbs, Left: 20 lbs, and 3 point pinch: Right: 9 (fingers slipped) lbs, Left: 17 lbs  07/15/24  Grip strength: Right: 12 lbs; Left:  106 lbs, Lateral pinch: Right: 11 lbs, Left: 20 lbs, and 3 point pinch: Right: 7 lbs, Left: 17 lbs   COORDINATION: Finger Nose Finger test: increased time/less precision with the RUE 9 Hole Peg test: Right: Placed 3 pegs in 2 min (stopped to limit frustration level) sec; Left: 24 sec  07/15/24:   9 Hole Peg test: Right: Placed 5 pegs in 1 min. & 52 sec.; Left: 24 sec.  SENSATION: R hand tingling/numbness dorsal/volar hand.    EDEMA: No visible swelling   MUSCLE TONE: RUE: Within functional limits  COGNITION: 0x4 Overall cognitive status: hx of schizophrenia, living with mother at baseline  VISION ASSESSMENT: WFL  PERCEPTION: Not tested  PRAXIS: Impaired: Motor planning, moderate-severe apraxia RUE  OBSERVATIONS:  Pt pleasant, cooperative, eager to improve use of his hand.                                                                                                      TREATMENT  DATE: 07/15/24  Measurements were obtained, and goals were reviewed with the patient.  Therapeutic Ex.:  -Progressive right gross grip strengthening with 11.2# of grip strength resistive gripping pegs standing vertically in a Pegboard, and placing them into a container placed on the tabletop to the right of the board.     PATIENT EDUCATION: Education details: frequency of treatment Person educated: Patient Education method: Explanation and Verbal cues, demo Education comprehension: verbalized understanding and needs further education  HOME EXERCISE PROGRAM: R grip strengthening with stress ball   GOALS: Goals reviewed with patient? Yes  SHORT TERM GOALS: Target date: 08/26/2024    Pt will perform HEP with min A or better for increasing RUE strength and coordination for daily tasks. Baseline: 07/15/24: Cues required, Eval: Not yet initiated  Goal status:  Ongoing  LONG TERM GOALS: Target date: 10/07/2024   Pt will increase grip strength by 20 or more lbs to enable ability to hold and carry heavier ADL supplies without dropping. Baseline: 07/15/24: Grip strength: Right: 26 lbs; Left: 106 lbs Eval: R 26; L 106 lbs Goal status:Ongoing  2.  Pt will increase R lateral pinch by 5 or more lbs to enable improved ability to stabilize eating utensils in R dominant hand. Baseline: 07/15/24:Lateral pinch: Right: 11 lbs, Left: 20 lbs, and 3 point pinch: Right: 7 lbs, Left: 17 lbs  Eval: R 15 lbs, L 20 lbs  Goal status: Ongoing  3.  Pt will increase R hand FMC skills to enable indep with managing clothing fasteners.   Baseline: 07/15/24: 9 Hole Peg test: Right: Placed 5 pegs in 1 min. & 52 sec.; Left: 24 sec. Eval: R: 9 hole placed 3 pegs in 2 min; L 24 sec (all 9 pegs); mother currently assists with clothing fasteners  Goal status :Ongoing  4.  Pt will increase RUE GMC skills to allow use of the R dominant arm for bathing at least 50% of body.  Baseline: 07/15/24: Pt. Continues to use the  the left non dominant hand for bathing. Eval: Pt reports he primarily uses L non-dominant arm  d/t difficulty bathing with the R  Goal status: Ongoing  ASSESSMENT:  CLINICAL IMPRESSION:  Pt. Attended 3 treatment sessions during this past recertification period. Measurements were obtained, and goals were reviewed with the Patient. Pt. Reports concern about limited progress being made so far. Pt. Presented with a decrease in right grip strength, and pinch strength. Right [facility] skills have improved since the initial eval, however FMC continues to be limited. Pt. Continues to present with difficulty using the RUE during ADL tasks at home. Pt. Education was provided about the importance of attending therapy sessions to maximize progress, and  to increase opportunities for engagement of the RUE during ADLs, and IADL tasks at home. Will plan to trial therapy into the next recertification period for consistent attendance 1 time a week. Pt. Reports being in agreement with this plan, and has a new schedule for October.  Pt. continues to benefit from skilled OT to increase RUE strength and coordination in order to work towards return to PLOF and to better engage the RUE into ADL/IADL tasks, reduce burden of care on caregivers, and improve QOL.  PERFORMANCE DEFICITS: in functional skills including ADLs, IADLs, coordination, dexterity, proprioception, sensation, strength, pain, flexibility, Fine motor control, Gross motor control, mobility, balance, body mechanics, decreased knowledge of precautions, decreased knowledge of use of DME, vision, and UE functional use, cognitive skills including attention, memory, and temperament/personality, and psychosocial skills including coping strategies, environmental adaptation, habits, and routines and behaviors.   IMPAIRMENTS: are limiting patient from ADLs, IADLs, education, leisure, and social participation.   CO-MORBIDITIES: has co-morbidities such as schizophrenia, L embolic  CVA, drug abuse, depression that affects occupational performance. Patient will benefit from skilled OT to address above impairments and improve overall function.  MODIFICATION OR ASSISTANCE TO COMPLETE EVALUATION: Min-Moderate modification of tasks or assist with assess necessary to complete an evaluation.  OT OCCUPATIONAL PROFILE AND HISTORY: Detailed assessment: Review of records and additional review of physical, cognitive, psychosocial history related to current functional performance.  CLINICAL DECISION MAKING: Moderate - several treatment options, min-mod task modification necessary  REHAB POTENTIAL: Good  EVALUATION COMPLEXITY: Moderate    PLAN:  OT FREQUENCY: 2x/week  OT DURATION: 12 weeks  PLANNED INTERVENTIONS: 97168 OT Re-evaluation, 97535 self care/ADL training, 02889 therapeutic exercise, 97530 therapeutic activity, 97112 neuromuscular re-education, 97140 manual therapy, 97116 gait training, 02989 moist heat, 97010 cryotherapy, 97750 Physical Performance Testing, 02239 Orthotic Initial, 97763 Orthotic/Prosthetic subsequent, passive range of motion, balance training, functional mobility training, visual/perceptual remediation/compensation, psychosocial skills training, energy conservation, coping strategies training, patient/family education, and DME and/or AE instructions  RECOMMENDED OTHER SERVICES: None at this time  CONSULTED AND AGREED WITH PLAN OF CARE: Patient  PLAN FOR NEXT SESSION: see above  Richardson Otter, OT  07/15/2024, 10:25 PM

## 2024-07-15 NOTE — Therapy (Signed)
 OUTPATIENT SPEECH LANGUAGE PATHOLOGY APHASIA TREATMENT / DISCHARGE SUMMARY   Patient Name: Aidenn Skellenger MRN: [id number] DOB:1981/04/22, 43 y.o., male Today's Date: 07/15/2024  PCP: Toribio Hoyle, PA REFERRING PROVIDER: Arthea Farrow, MD   End of Session - 04/28/24 1056     Visit Number 3   Number of Visits 12    Date for SLP Re-Evaluation 07/14/24    Authorization Type [employer]    Progress Note Due on Visit 10    SLP Start Time 1145   SLP Stop Time  1230   SLP Time Calculation (min) 35 min    Activity Tolerance Patient tolerated treatment well; however, requested to terminate session early          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   left cerebrum   Erectile dysfunction    Heart murmur    Hyperlipidemia    Schizophrenia (HCC)    Tobacco abuse    Past Surgical History:  Procedure Laterality Date   BOWEL RESECTION N/A 09/04/2022   Procedure: SMALL BOWEL RESECTION, open;  Surgeon: Jordis Laneta FALCON, MD;  Location: [facility] ORS;  Service: General;  Laterality: N/A;   IR CT HEAD LTD  03/21/2023   IR CT HEAD LTD  03/21/2023   IR PERCUTANEOUS ART THROMBECTOMY/INFUSION INTRACRANIAL INC DIAG ANGIO  03/21/2023   ORIF ORBITAL FRACTURE  1985   PARTIAL COLECTOMY Right 09/04/2022   Procedure: PARTIAL COLECTOMY;  Surgeon: Jordis Laneta FALCON, MD;  Location: [facility] ORS;  Service: General;  Laterality: Right;  Right colectimy   RADIOLOGY WITH ANESTHESIA N/A 03/21/2023   Procedure: RADIOLOGY WITH ANESTHESIA;  Surgeon: Radiologist, Medication, MD;  Location: [facility];  Service: Radiology;  Laterality: N/A;   TEE WITHOUT CARDIOVERSION N/A 07/19/2020   Procedure: TRANSESOPHAGEAL ECHOCARDIOGRAM (TEE);  Surgeon: Hester Wolm PARAS, MD;  Location: [facility] ORS;  Service: Cardiovascular;  Laterality: N/A;   WRIST SURGERY Left 11/07/2013   REPAIR ULNAR ARTERY, NERVE AND TENDON    Patient Active Problem List   Diagnosis Date Noted   Speech and language deficits 04/22/2023   Hemiparesis affecting right side as late effect of cerebrovascular accident (CVA) (HCC) 04/22/2023   Spasticity 04/22/2023   Chronic diarrhea 04/22/2023   Chronic schizophrenia (HCC) 04/01/2023   S/P right colectomy 09/04/2022   Dental decay 06/12/2022   Erectile dysfunction 07/26/2021   Hyperlipidemia LDL goal <70 03/28/2021   History of embolic stroke 07/18/2020   Drug abuse (HCC) 07/17/2020   Major depressive disorder, recurrent episode, severe (HCC) 02/20/2015   Alcohol use disorder, severe, dependence (HCC) 02/20/2015   Stimulant use disorder 02/20/2015   Tobacco use disorder 02/20/2015    ONSET DATE: 03/22/2023; referral date 04/10/24  REFERRING DIAG:  P30.648 (ICD-10-CM) - Hemiparesis affecting right side as late effect of cerebrovascular accident (CVA) (HCC)  Z86.73 (ICD-10-CM) - History of embolic stroke  Q19.0 (ICD-10-CM) - Speech and language deficits    THERAPY DIAG: aphasia, apraxia, dysarthria   Rationale for Evaluation and Treatment Rehabilitation  SUBJECTIVE:   SUBJECTIVE STATEMENT: Pt with alert, distracted Pt accompanied by: self  PERTINENT HISTORY and DIAGNOSTIC FINDINGS: Patient is a 43 year old right-handed male with PMH of alcohol/tobacco use, left basal ganglia embolic stroke in 2021 (no residual deficits), schizophrenia maintained on Abilify , Depakote , Lexapro  as well as Cogentin , hyperlipidemia, and 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 Presented 03/21/2023 to [facility] with  acute onset of right-sided weakness facial droop and aphasia. Cranial CT scan worrisome for acute infarct in the posterior left parietal lobe. CTA showed occlusion of 2 proximal left M2 branches and mild left M1 stenosis. Widely patent carotid arteries.  Patient transferred to [facility] for admission and underwent arteriogram with revascularization of occluded left MCA however it  reoccluded. MRI of the brain showed interval increase in size of acute/subacute nonhemorrhagic infarct involving the majority the left insular cortex and posterior left frontal operculum extending into the high posterior left parietal lobe. No acute hemorrhage or significant mass effect. Patient admitted to CIR on 03/27/23, discharged .  PAIN:  Are you having pain? No  FALLS: Has patient fallen in last 6 months?  No  LIVING ENVIRONMENT: Lives with: mother Lives in: Mobile home  PLOF:  Level of assistance: Independent with ADLs, Independent with IADLs    PATIENT GOALS    to get my speech right   OBJECTIVE:  TODAY'S TREATMENT:  Reviewed compensations for dysarthria and circumlocution/SFA for wordfinding. Min cueing to utilize compensations for dysarthria and anomia during structured tasks. Reduced carry-over to conversational speech, although improving.   PATIENT EDUCATION: Education details: as above Person educated: Patient Education method: Explanation Education comprehension: verbalized understanding and returned demonstration  HOME EXERCISE PROGRAM:   Continue use of compensations    GOALS:  Goals reviewed with patient? Yes  SHORT TERM GOALS: Target date: 10 sessions  With Min A, patient will describe visual scenes using 3 or more sentences at 75% accuracy. Baseline: Goal status: PROGRESSING  2.  With Moderate A, patient will complete a semantic feature analysis with at least 3 relevant features for 4/5 target words to improve word-finding skills.    Baseline:  Goal status: MET  3.  Pt will utilize compensatory strategies for dysarthria with min cueing for 8/10 sentences to improve speech intelligibility.  Baseline:  Goal status: MET  LONG TERM GOALS: Target date: 12 weeks  With min A, pt will utilize compensatory strategies for anomia at the conversation level.  Baseline:  Goal status: PROGRESSING  2.  With min A, pt will utilize compensatory  strategies for dysarthria at the conversation level.   Baseline:  Goal status: PROGRESSING  3.  With Rare Min A, patient/family will demonstrate understanding of the following concepts: aphasia, spontaneous recovery, communication vs conversation, strengths/strategies to promote success, local resources by answering multiple choice questions with 75% accuracy when provided supported conversation in order to increase patient's participation in medical care.    Baseline:  Goal status: PROGRESSING  ASSESSMENT:  CLINICAL IMPRESSION: Patient is a 43 y.o. right handed male who was seen today for a speech language tx. 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 Pt has been seen for 4 visits total given pt's difficulty securing transportation. Pt stimulable for communication strategies; however, pt with reduced carryover from session-to-session given attendance and limited caregiver support. Pt given HEP in previous session which he did not complete. Additionally, pt asking to d/c ST services prior to end of 45 minute tx session on several occasions. Given pt's pt is now formally d/c'd from ST. Recommend consideration for [facility] ST services for communication deficits. Pt agreeable to pursuing HH ST.    PLAN: D/C OP ST: Consider HH ST  Delon Bangs, M.S., CCC-SLP Speech-Language Pathologist [place] [facility] [phone] (ASCOM)

## 2024-07-16 ENCOUNTER — Ambulatory Visit: Payer: MEDICAID | Admitting: Physician Assistant

## 2024-07-21 ENCOUNTER — Telehealth: Payer: Self-pay

## 2024-07-21 NOTE — Telephone Encounter (Signed)
 Called mother she verbalized understanding.  KP

## 2024-07-21 NOTE — Telephone Encounter (Signed)
 Copied from CRM #[id number]. Topic: General - Other >> Jul 21, 2024 12:11 PM Jasmin G wrote: Reason for CRM: Pt's mother called to request a call back at [phone] to discuss pt's recent issue, she states that due to his med pt is eating a lot throughout the day even he's full and is making him feel sick. Please call to advise.

## 2024-07-22 ENCOUNTER — Ambulatory Visit: Payer: MEDICAID | Attending: Physician Assistant

## 2024-07-22 ENCOUNTER — Ambulatory Visit: Payer: MEDICAID

## 2024-07-22 DIAGNOSIS — I69351 Hemiplegia and hemiparesis following cerebral infarction affecting right dominant side: Secondary | ICD-10-CM | POA: Insufficient documentation

## 2024-07-22 DIAGNOSIS — R278 Other lack of coordination: Secondary | ICD-10-CM | POA: Insufficient documentation

## 2024-07-22 DIAGNOSIS — M6281 Muscle weakness (generalized): Secondary | ICD-10-CM | POA: Insufficient documentation

## 2024-07-23 NOTE — Therapy (Signed)
 OUTPATIENT OCCUPATIONAL THERAPY NEURO TREATMENT NOTE  Patient Name: Brian Rios MRN: [id number] DOB:1981-07-09, 43 y.o., male Today's Date: 07/23/2024  PCP: Toribio Hoyle, PA REFERRING PROVIDER: Toribio Hoyle, PA  END OF SESSION:  OT End of Session - 07/22/24 1328     Visit Number 5    Number of Visits 24    Date for Recertification  10/07/24    OT Start Time 1315    OT Stop Time 1400    OT Time Calculation (min) 45 min    Activity Tolerance Patient tolerated treatment well    Behavior During Therapy [facility] for tasks assessed/performed         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   left cerebrum   Erectile dysfunction    Heart murmur    Hyperlipidemia    Schizophrenia (HCC)    Tobacco abuse    Past Surgical History:  Procedure Laterality Date   BOWEL RESECTION N/A 09/04/2022   Procedure: SMALL BOWEL RESECTION, open;  Surgeon: Jordis Laneta FALCON, MD;  Location: [facility] ORS;  Service: General;  Laterality: N/A;   IR CT HEAD LTD  03/21/2023   IR CT HEAD LTD  03/21/2023   IR PERCUTANEOUS ART THROMBECTOMY/INFUSION INTRACRANIAL INC DIAG ANGIO  03/21/2023   ORIF ORBITAL FRACTURE  1985   PARTIAL COLECTOMY Right 09/04/2022   Procedure: PARTIAL COLECTOMY;  Surgeon: Jordis Laneta FALCON, MD;  Location: [facility] ORS;  Service: General;  Laterality: Right;  Right colectimy   RADIOLOGY WITH ANESTHESIA N/A 03/21/2023   Procedure: RADIOLOGY WITH ANESTHESIA;  Surgeon: Radiologist, Medication, MD;  Location: [facility];  Service: Radiology;  Laterality: N/A;   TEE WITHOUT CARDIOVERSION N/A 07/19/2020   Procedure: TRANSESOPHAGEAL ECHOCARDIOGRAM (TEE);  Surgeon: Hester Wolm PARAS, MD;  Location: [facility] ORS;  Service: Cardiovascular;  Laterality: N/A;   WRIST SURGERY Left 11/07/2013   REPAIR ULNAR ARTERY, NERVE AND TENDON   Patient Active Problem List   Diagnosis Date Noted   Speech and language  deficits 04/22/2023   Hemiparesis affecting right side as late effect of cerebrovascular accident (CVA) (HCC) 04/22/2023   Spasticity 04/22/2023   Chronic diarrhea 04/22/2023   Chronic schizophrenia (HCC) 04/01/2023   S/P right colectomy 09/04/2022   Dental decay 06/12/2022   Erectile dysfunction 07/26/2021   Hyperlipidemia LDL goal <70 03/28/2021   History of embolic stroke 07/18/2020   Drug abuse (HCC) 07/17/2020   Major depressive disorder, recurrent episode, severe (HCC) 02/20/2015   Alcohol use disorder, severe, dependence (HCC) 02/20/2015   Stimulant use disorder 02/20/2015   Tobacco use disorder 02/20/2015   ONSET DATE: 03/20/24  REFERRING DIAG: Muscle weakness (generalized)  Other lack of coordination  Hemiparesis affecting right side as late effect of cerebrovascular accident (HCC)  THERAPY DIAG:  Muscle weakness (generalized)  Other lack of coordination  Hemiparesis affecting right side as late effect of cerebrovascular accident [facility])  Rationale for Evaluation and Treatment: Rehabilitation  SUBJECTIVE:  SUBJECTIVE STATEMENT: Pt inquired about SLP services in the home.  OT advised that pt could not participate in [facility] and outpatient simultaneously.  Pt verbalized desire to continue with his outpatient therapy for OT for the time being.  May consider HH for OT and SLP if attendance becomes problematic again. Pt accompanied by: self  PERTINENT HISTORY: Per chart: Patient is a 43 year old right-handed male with PMH of alcohol/tobacco use, left basal ganglia  embolic stroke in 2021 (no residual deficits), schizophrenia maintained on Abilify , Depakote , Lexapro  as well as Cogentin , hyperlipidemia, and 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 Presented 03/21/2023 to [facility] with acute onset of right-sided weakness facial droop and aphasia. Cranial CT scan worrisome for acute infarct in the posterior left parietal lobe. CTA showed occlusion of 2 proximal left M2 branches and mild  left M1 stenosis. Widely patent carotid arteries.  Patient transferred to [facility] for admission and underwent arteriogram with revascularization of occluded left MCA however it reoccluded. MRI of the brain showed interval increase in size of acute/subacute nonhemorrhagic infarct involving the majority the left insular cortex and posterior left frontal operculum extending into the high posterior left parietal lobe. No acute hemorrhage or significant mass effect. Patient admitted to CIR on 03/27/23, discharged .   PRECAUTIONS: None  WEIGHT BEARING RESTRICTIONS: No  PAIN: 07/22/24: No pain reported Are you having pain? Yes: NPRS scale: 7/10 in the wrist, >7/10 in bilat feet  Pain location: both feet, R wrist  Pain description: unable  Aggravating factors: walking a lot,  Relieving factors: nothing   FALLS: Has patient fallen in last 6 months? Yes. Number of falls Pt estimates 2 times   LIVING ENVIRONMENT: Lives with: lives with their family Lives in: Mobile home Stairs: Yes: External: 1 steps; on right going up and on left going up Has following equipment at home: None  PLOF: Family assisted with some ADLs d/t psychiatric hx   PATIENT GOALS: I hope I get some more use out of my hand.   OBJECTIVE:  Note: Objective measures were completed at Evaluation unless otherwise noted.  HAND DOMINANCE: Right  ADLs: Overall ADLs: intermittent assist from mother Transfers/ambulation related to ADLs: indep Eating: using L non-dominant hand with utensils; unable to cut food  Grooming: using L non-dominant hand UB Dressing: difficulty with clothing fasteners LB Dressing: mom helps with tying draw string pants  Toileting:  using L non-dominant hand Bathing: pt reports he can use his R hand a little, but it's difficulty  Tub Shower transfers: indep Equipment: none  IADLs:  Shopping: supv Light housekeeping: mother assists  Meal Prep: mother currently manages, but pt reports he cooked prior  to CVA Community mobility: Pt relies on friends and family for driving, but reports he was able to drive prior to his CVA Medication management: mom currently managing; pt reports he was able to manage them before his stroke  Financial management: pt reports he has 1 bill (rent) and he pays his mom.  Handwriting: Difficulty grasping pen, scribbles for signature, but not at baseline.   POSTURE COMMENTS:  No Significant postural limitations  ACTIVITY TOLERANCE: Activity tolerance: Will continue to assess within functional contexts   UPPER EXTREMITY ROM:  BUEs WFL  UPPER EXTREMITY MMT:   R shoulder 4+/5, L 5/5  MMT Right eval Right  07/15/24  Left eval  Shoulder flexion 4+ 4+/5  5  Shoulder abduction 4+ 4+/5  5  Shoulder adduction      Shoulder extension      Shoulder internal rotation      Shoulder external rotation      Middle trapezius      Lower trapezius      Elbow flexion 4+ 4+/5  5  Elbow extension 4+ 4+/5  5  Wrist flexion 4+ 4+/5  5  Wrist extension 4+ 4+/5  5  Wrist ulnar deviation      Wrist radial deviation      Wrist  pronation      Wrist supination      (Blank rows = not tested)  HAND FUNCTION: Grip strength: Right: 26 lbs; Left: 106 lbs, Lateral pinch: Right: 15 lbs, Left: 20 lbs, and 3 point pinch: Right: 9 (fingers slipped) lbs, Left: 17 lbs  07/15/24  Grip strength: Right: 12 lbs; Left: 106 lbs, Lateral pinch: Right: 11 lbs, Left: 20 lbs, and 3 point pinch: Right: 7 lbs, Left: 17 lbs   COORDINATION: Finger Nose Finger test: increased time/less precision with the RUE 9 Hole Peg test: Right: Placed 3 pegs in 2 min (stopped to limit frustration level) sec; Left: 24 sec  07/15/24:   9 Hole Peg test: Right: Placed 5 pegs in 1 min. & 52 sec.; Left: 24 sec.  SENSATION: R hand tingling/numbness dorsal/volar hand.    EDEMA: No visible swelling   MUSCLE TONE: RUE: Within functional limits  COGNITION: 0x4 Overall cognitive status: hx of schizophrenia,  living with mother at baseline  VISION ASSESSMENT: WFL  PERCEPTION: Not tested  PRAXIS: Impaired: Motor planning, moderate-severe apraxia RUE  OBSERVATIONS:  Pt pleasant, cooperative, eager to improve use of his hand.                                                                                                      TREATMENT DATE: 07/22/24 Therapeutic Activity: -Facilitated RUE FMC skills and grip strengthening: pt placed jumbo pegs into pegboard and removed using hand gripper set at 6.6# for 2 rounds.  Pt attempted 1 round at 11.2# but managed 4 pegs only before downgrading back to 6.6# d/t multiple dropped pegs. Pt required min A to remove pegs from container if a peg was stuck in a side groove, occasional assist from the L hand to reposition peg in R hand.  Neuro re-ed: -Facilitated R and bilat hand coordination skills working to pick up washers from magnetic dish, thread over a vertical dowel, then remove from horiz dowel stabilized by L hand.  Intermittent min A to separate washers in prep for sliding washers off dowel.   PATIENT EDUCATION: Education details: R hand FMC skills Person educated: Patient Education method: Explanation and Verbal cues, demo Education comprehension: verbalized understanding and needs further education  HOME EXERCISE PROGRAM: R grip strengthening with stress ball   GOALS: Goals reviewed with patient? Yes  SHORT TERM GOALS: Target date: 08/26/2024    Pt will perform HEP with min A or better for increasing RUE strength and coordination for daily tasks. Baseline: 07/15/24: Cues required, Eval: Not yet initiated  Goal status:  Ongoing  LONG TERM GOALS: Target date: 10/07/2024   Pt will increase grip strength by 20 or more lbs to enable ability to hold and carry heavier ADL supplies without dropping. Baseline: 07/15/24: Grip strength: Right: 26 lbs; Left: 106 lbs Eval: R 26; L 106 lbs Goal status:Ongoing  2.  Pt will increase R lateral pinch by 5  or more lbs to enable improved ability to stabilize eating utensils in R dominant hand. Baseline: 07/15/24:Lateral pinch: Right: 11 lbs, Left: 20 lbs, and 3 point  pinch: Right: 7 lbs, Left: 17 lbs  Eval: R 15 lbs, L 20 lbs  Goal status: Ongoing  3.  Pt will increase R hand FMC skills to enable indep with managing clothing fasteners.   Baseline: 07/15/24: 9 Hole Peg test: Right: Placed 5 pegs in 1 min. & 52 sec.; Left: 24 sec. Eval: R: 9 hole placed 3 pegs in 2 min; L 24 sec (all 9 pegs); mother currently assists with clothing fasteners  Goal status :Ongoing  4.  Pt will increase RUE GMC skills to allow use of the R dominant arm for bathing at least 50% of body.  Baseline: 07/15/24: Pt. Continues to use the the left non dominant hand for bathing. Eval: Pt reports he primarily uses L non-dominant arm d/t difficulty bathing with the R  Goal status: Ongoing  ASSESSMENT:  CLINICAL IMPRESSION: Pt arrived early for his session, but OT was able to accommodate early arrival this date.  Pt tolerated hand gripper setting at 6.6# to remove jumbo pegs from pegboard.  Attempted 11.2#, but was able to remove only 4 pegs before needing to downgrade back to 6.6# setting.  Pt required increased time to remove pegs from container, and was unable to reposition peg within fingertips from horiz to vertical position, and compensated with forearm and wrist mobility or use of L hand to help with repositioning.  Pt requires low stimulus environment, typically pausing RUE activity when trying to converse; limited ability to dual task.  Pt. continues to benefit from skilled OT to increase RUE strength and coordination in order to work towards return to PLOF and to better engage the RUE into ADL/IADL tasks, reduce burden of care on caregivers, and improve QOL.  PERFORMANCE DEFICITS: in functional skills including ADLs, IADLs, coordination, dexterity, proprioception, sensation, strength, pain, flexibility, Fine motor control,  Gross motor control, mobility, balance, body mechanics, decreased knowledge of precautions, decreased knowledge of use of DME, vision, and UE functional use, cognitive skills including attention, memory, and temperament/personality, and psychosocial skills including coping strategies, environmental adaptation, habits, and routines and behaviors.   IMPAIRMENTS: are limiting patient from ADLs, IADLs, education, leisure, and social participation.   CO-MORBIDITIES: has co-morbidities such as schizophrenia, L embolic CVA, drug abuse, depression that affects occupational performance. Patient will benefit from skilled OT to address above impairments and improve overall function.  MODIFICATION OR ASSISTANCE TO COMPLETE EVALUATION: Min-Moderate modification of tasks or assist with assess necessary to complete an evaluation.  OT OCCUPATIONAL PROFILE AND HISTORY: Detailed assessment: Review of records and additional review of physical, cognitive, psychosocial history related to current functional performance.  CLINICAL DECISION MAKING: Moderate - several treatment options, min-mod task modification necessary  REHAB POTENTIAL: Good  EVALUATION COMPLEXITY: Moderate    PLAN:  OT FREQUENCY: 2x/week  OT DURATION: 12 weeks  PLANNED INTERVENTIONS: 97168 OT Re-evaluation, 97535 self care/ADL training, 02889 therapeutic exercise, 97530 therapeutic activity, 97112 neuromuscular re-education, 97140 manual therapy, 97116 gait training, 02989 moist heat, 97010 cryotherapy, 97750 Physical Performance Testing, 02239 Orthotic Initial, 97763 Orthotic/Prosthetic subsequent, passive range of motion, balance training, functional mobility training, visual/perceptual remediation/compensation, psychosocial skills training, energy conservation, coping strategies training, patient/family education, and DME and/or AE instructions  RECOMMENDED OTHER SERVICES: None at this time  CONSULTED AND AGREED WITH PLAN OF CARE:  Patient  PLAN FOR NEXT SESSION: see above  Inocente MARLA Blazing, OT  07/23/2024, 12:09 PM

## 2024-07-29 ENCOUNTER — Ambulatory Visit: Payer: MEDICAID

## 2024-07-29 ENCOUNTER — Telehealth: Payer: Self-pay

## 2024-07-29 NOTE — Telephone Encounter (Signed)
 Notified mother of pt's no show today. She stated she was unaware pt's appt. OT provided mother c next scheduled OT time and she confirmed she would call transportation for pt to be present. Clinic # given and reminded mother to call c schedule changes.   Inocente Blazing, MS, OTR/L

## 2024-07-30 ENCOUNTER — Ambulatory Visit: Payer: MEDICAID | Admitting: Physician Assistant

## 2024-07-30 ENCOUNTER — Encounter: Payer: Self-pay | Admitting: Physician Assistant

## 2024-07-30 VITALS — BP 110/76 | HR 69 | Temp 98.0°F | Ht 70.0 in | Wt 198.0 lb

## 2024-07-30 DIAGNOSIS — Z59819 Housing instability, housed unspecified: Secondary | ICD-10-CM

## 2024-07-30 DIAGNOSIS — E785 Hyperlipidemia, unspecified: Secondary | ICD-10-CM | POA: Diagnosis not present

## 2024-07-30 DIAGNOSIS — I69351 Hemiplegia and hemiparesis following cerebral infarction affecting right dominant side: Secondary | ICD-10-CM | POA: Diagnosis not present

## 2024-07-30 DIAGNOSIS — F1910 Other psychoactive substance abuse, uncomplicated: Secondary | ICD-10-CM | POA: Diagnosis not present

## 2024-07-30 DIAGNOSIS — Z23 Encounter for immunization: Secondary | ICD-10-CM | POA: Diagnosis not present

## 2024-07-30 DIAGNOSIS — F1021 Alcohol dependence, in remission: Secondary | ICD-10-CM

## 2024-07-30 NOTE — Assessment & Plan Note (Signed)
 FL2 form completed, copied, and given back to patient. Will scan to chart. I contacted [facility] to see if they could clarify the situation, LVM. Will be submitting a care management referral as well, maybe they can help.

## 2024-07-30 NOTE — Assessment & Plan Note (Signed)
 Congratulated on mostly abstaining from alcohol. Encouraged ongoing sobriety .

## 2024-07-30 NOTE — Assessment & Plan Note (Signed)
 Continue PT and neuro f/u.

## 2024-07-30 NOTE — Assessment & Plan Note (Signed)
 Strongly discouraged ongoing use of tobacco and marijuana. He is precontemplative. Explained that his increased appetite is probably from his increased marijuana use.

## 2024-07-30 NOTE — Assessment & Plan Note (Signed)
 Patient says he is fasting today, will recheck lipids. Continue atorvastatin 

## 2024-07-30 NOTE — Progress Notes (Signed)
 Date:  07/30/2024   Name:  Brian Rios   DOB:  [birth year]   MRN:  [id number]   Chief Complaint: FL2 Form, Medical Management of Chronic Issues, and Increased appetite (X2-3 weeks, wondering if it due to medication )  HPI [place] presents today for 36-month follow-up on chronic conditions including right hemiparesis s/p CVA. Hx polysubstance abuse including alcohol, cannabis, tobacco. He is a poor historian but is here alone today. Did not bring his medications.   Sees [facility] for his schizophrenia and polysubstance use disorder. They manage his Ingrezza  and Abilify .   Still smoking cigars daily and marijuana.  We have had multiple conversations about this, but he is not ready to quit tobacco despite several strokes.   After delaying appointment for about a year, he finally established with neurology on 04/08/2024 for residual deficits after stroke.  Including right hand weakness/coordination deficit.  He thankfully also started PT/OT for this. He has neuro f/u scheduled for 08/25/24.   His mother mentioned via MyChart message he has experienced increased appetite, attributes this to medication side effect. He takes mirtazapine  45 mg and has done so for at least the last 4 years. He has been smoking more marijuana recently, reporting daily or near-daily use.   He would like for me to complete an FL2 form but it is unlabeled and he does not know where it needs to go. I called his mother and she does not know either. Neither of them knows where the form came from.    Medication list has been reviewed and updated.  Current Meds  Medication Sig   ABILIFY  MAINTENA 400 MG PRSY prefilled syringe SMARTSIG:1 Each IM Every 4 Weeks   acetaminophen  (TYLENOL ) 325 MG tablet Take 2 tablets (650 mg total) by mouth every 4 (four) hours as needed for mild pain (or temp > 37.5 C (99.5 F)).   ARIPiprazole  (ABILIFY ) 30 MG tablet Take 1 tablet (30 mg total) by mouth at bedtime.   aspirin  81 MG chewable  tablet Chew 1 tablet (81 mg total) by mouth daily.   atorvastatin  (LIPITOR) 40 MG tablet Take 1 tablet (40 mg total) by mouth daily.   benztropine  (COGENTIN ) 1 MG tablet Take 1 tablet (1 mg total) by mouth at bedtime.   divalproex  (DEPAKOTE  ER) 250 MG 24 hr tablet Take 3 tablets (750 mg total) by mouth at bedtime.   escitalopram  (LEXAPRO ) 20 MG tablet Take 1 tablet (20 mg total) by mouth daily.   hydrOXYzine  (ATARAX ) 25 MG tablet Take 25 mg by mouth 3 (three) times daily.   Lactobacillus (ACIDOPHILUS) CAPS capsule Take 1 capsule by mouth in the morning and at bedtime.   loperamide  (IMODIUM  A-D) 2 MG tablet Take 2 tablets (4 mg total) by mouth 3 (three) times daily as needed for diarrhea or loose stools.   mirtazapine  (REMERON ) 45 MG tablet Take 1 tablet (45 mg total) by mouth at bedtime.   valbenazine  (INGREZZA ) 80 MG capsule Take 1 capsule (80 mg total) by mouth daily.     Review of Systems  Patient Active Problem List   Diagnosis Date Noted   Housing instability, currently housed 07/30/2024   Speech and language deficits 04/22/2023   Hemiparesis affecting right side as late effect of cerebrovascular accident (CVA) (HCC) 04/22/2023   Spasticity 04/22/2023   Chronic diarrhea 04/22/2023   Chronic schizophrenia (HCC) 04/01/2023   S/P right colectomy 09/04/2022   Dental decay 06/12/2022   Erectile dysfunction 07/26/2021   Hyperlipidemia LDL goal <  70 03/28/2021   History of embolic stroke 07/18/2020   Drug abuse (HCC) 07/17/2020   Major depressive disorder, recurrent episode, severe (HCC) 02/20/2015   Alcohol use disorder, severe, in early remission (HCC) 02/20/2015   Stimulant use disorder 02/20/2015   Tobacco use disorder 02/20/2015    No Known Allergies  Immunization History  Administered Date(s) Administered   Influenza, Seasonal, Injecte, Preservative Fre 07/30/2024   Tdap 11/07/2013, 04/05/2016    Past Surgical History:  Procedure Laterality Date   BOWEL RESECTION N/A  09/04/2022   Procedure: SMALL BOWEL RESECTION, open;  Surgeon: Jordis Laneta FALCON, MD;  Location: [facility] ORS;  Service: General;  Laterality: N/A;   IR CT HEAD LTD  03/21/2023   IR CT HEAD LTD  03/21/2023   IR PERCUTANEOUS ART THROMBECTOMY/INFUSION INTRACRANIAL INC DIAG ANGIO  03/21/2023   ORIF ORBITAL FRACTURE  1985   PARTIAL COLECTOMY Right 09/04/2022   Procedure: PARTIAL COLECTOMY;  Surgeon: Jordis Laneta FALCON, MD;  Location: [facility] ORS;  Service: General;  Laterality: Right;  Right colectimy   RADIOLOGY WITH ANESTHESIA N/A 03/21/2023   Procedure: RADIOLOGY WITH ANESTHESIA;  Surgeon: Radiologist, Medication, MD;  Location: [facility];  Service: Radiology;  Laterality: N/A;   TEE WITHOUT CARDIOVERSION N/A 07/19/2020   Procedure: TRANSESOPHAGEAL ECHOCARDIOGRAM (TEE);  Surgeon: Hester Wolm PARAS, MD;  Location: [facility] ORS;  Service: Cardiovascular;  Laterality: N/A;   WRIST SURGERY Left 11/07/2013   REPAIR ULNAR ARTERY, NERVE AND TENDON    Social History   Tobacco Use   Smoking status: Every Day    Current packs/day: 0.00    Average packs/day: 1 pack/day for 15.0 years (15.0 ttl pk-yrs)    Types: Cigars, Cigarettes    Start date: 03/2023    Last attempt to quit: 03/2023    Years since quitting: 1.3    Passive exposure: Current   Smokeless tobacco: Never   Tobacco comments:    Formerly cigarettes x15y. Now smoking about 5 cigars per day, no cigarettes  Vaping Use   Vaping status: Never Used  Substance Use Topics   Alcohol use: Not Currently    Comment: 3-4 forties 40 oz malt liquor roughly 3 days per week   Drug use: Yes    Frequency: 7.0 times per week    Types: Crack cocaine, Marijuana    Comment: Marijuana 3x/wk. Crack 2023    Family History  Problem Relation Age of Onset   Alcoholism Mother    Hypertension Mother    Breast cancer Mother    Alcoholism Father    Stroke Maternal Grandmother    Hypertension Maternal Grandmother    Diabetes Maternal Grandmother         07/30/2024     1:18 PM 04/01/2024    2:55 PM 12/13/2023    9:58 AM 08/15/2023    3:06 PM  GAD 7 : Generalized Anxiety Score  Nervous, Anxious, on Edge 0 1 0 1  Control/stop worrying 0 1 0 1  Worry too much - different things 0 1 0 1  Trouble relaxing 0 0 0 0  Restless 0 0 0 0  Easily annoyed or irritable 0 0 0 0  Afraid - awful might happen 0 0 0 0  Total GAD 7 Score 0 3 0 3  Anxiety Difficulty Not difficult at all Not difficult at all Not difficult at all Not difficult at all       07/30/2024    1:18 PM 04/01/2024    2:55 PM 12/13/2023  9:57 AM  Depression screen PHQ 2/9  Decreased Interest 1 1 0  Down, Depressed, Hopeless 1 1 0  PHQ - 2 Score 2 2 0  Altered sleeping 2 0 0  Tired, decreased energy 2 2 2   Change in appetite 2 0 0  Feeling bad or failure about yourself  0 0 0  Trouble concentrating 0 0 0  Moving slowly or fidgety/restless 0 0 0  Suicidal thoughts 0 0 0  PHQ-9 Score 8 4 2   Difficult doing work/chores Not difficult at all Not difficult at all Not difficult at all    BP Readings from Last 3 Encounters:  07/30/24 110/76  04/01/24 110/72  03/19/24 120/64    Wt Readings from Last 3 Encounters:  07/30/24 198 lb (89.8 kg)  04/01/24 194 lb (88 kg)  12/13/23 196 lb (88.9 kg)    BP 110/76   Pulse 69   Temp 98 F (36.7 C)   Ht 5' 10 (1.778 m)   Wt 198 lb (89.8 kg)   SpO2 98%   BMI 28.41 kg/m   Physical Exam Vitals and nursing note reviewed.  Constitutional:      Appearance: Normal appearance.  Eyes:     Comments: Left exotropia  Cardiovascular:     Rate and Rhythm: Normal rate.  Pulmonary:     Effort: Pulmonary effort is normal.  Abdominal:     General: There is no distension.  Musculoskeletal:        General: Normal range of motion.  Skin:    General: Skin is warm and dry.  Neurological:     Mental Status: He is alert and oriented to person, place, and time.     Gait: Gait is intact.     Comments: Gait intact without assistance. Some speech difficulty  apparent, but patient is mostly communicative and easy to understand.    Psychiatric:        Mood and Affect: Mood and affect normal.     Recent Labs     Component Value Date/Time   NA 143 08/15/2023 1616   NA 137 02/14/2015 1915   K 4.4 08/15/2023 1616   K 3.7 02/14/2015 1915   CL 107 (H) 08/15/2023 1616   CL 107 02/14/2015 1915   CO2 24 08/15/2023 1616   CO2 21 (L) 02/14/2015 1915   GLUCOSE 86 08/15/2023 1616   GLUCOSE 96 03/28/2023 1038   GLUCOSE 85 02/14/2015 1915   BUN 11 08/15/2023 1616   BUN 12 02/14/2015 1915   CREATININE 0.96 08/15/2023 1616   CREATININE 0.88 02/14/2015 1915   CALCIUM  9.1 08/15/2023 1616   CALCIUM  9.3 02/14/2015 1915   PROT 7.2 08/15/2023 1616   PROT 8.3 (H) 02/14/2015 1915   ALBUMIN 4.3 08/15/2023 1616   ALBUMIN 4.7 02/14/2015 1915   AST 16 08/15/2023 1616   AST 25 02/14/2015 1915   ALT 14 08/15/2023 1616   ALT 32 02/14/2015 1915   ALKPHOS 88 08/15/2023 1616   ALKPHOS 62 02/14/2015 1915   BILITOT <0.2 08/15/2023 1616   BILITOT 0.3 02/14/2015 1915   GFRNONAA >60 03/28/2023 1038   GFRNONAA >60 02/14/2015 1915   GFRAA >60 07/17/2020 1338   GFRAA >60 02/14/2015 1915    Lab Results  Component Value Date   WBC 9.3 08/15/2023   HGB 13.8 08/15/2023   HCT 42.8 08/15/2023   MCV 86 08/15/2023   PLT 228 08/15/2023   Lab Results  Component Value Date   HGBA1C 5.6 03/23/2023  HGBA1C 5.7 (H) 09/05/2022   HGBA1C 6.1 (H) 07/18/2022   Lab Results  Component Value Date   CHOL 111 08/15/2023   HDL 52 08/15/2023   LDLCALC 43 08/15/2023   TRIG 76 08/15/2023   CHOLHDL 2.1 08/15/2023   Lab Results  Component Value Date   TSH 4.040 03/28/2021      Assessment and Plan:  Hemiparesis affecting right side as late effect of cerebrovascular accident (CVA) (HCC) Assessment & Plan: Continue PT and neuro f/u.   Orders: -     CBC with Differential/Platelet -     Comprehensive metabolic panel with GFR -     TSH -     Lipid  panel  Hyperlipidemia LDL goal <70 Assessment & Plan: Patient says he is fasting today, will recheck lipids. Continue atorvastatin     Orders: -     Lipid panel  Drug abuse (HCC) Assessment & Plan: Strongly discouraged ongoing use of tobacco and marijuana. He is precontemplative. Explained that his increased appetite is probably from his increased marijuana use.   Orders: -     Drug Screen, Urine  Alcohol use disorder, severe, in early remission [facility]) Assessment & Plan: Congratulated on mostly abstaining from alcohol. Encouraged ongoing sobriety .  Orders: -     CBC with Differential/Platelet -     Comprehensive metabolic panel with GFR -     TSH -     Lipid panel  Housing instability, currently housed Assessment & Plan: FL2 form completed, copied, and given back to patient. Will scan to chart. I contacted [facility] to see if they could clarify the situation, LVM. Will be submitting a care management referral as well, maybe they can help.    Encounter for immunization -     Flu vaccine trivalent PF, 6mos and older(Flulaval,Afluria,Fluarix,Fluzone)     Return in about 3 months (around 10/30/2024) for OV f/u chronic conditions.    I personally spent a total of 50 minutes in the care of the patient today including preparing to see the patient, getting/reviewing separately obtained history, performing a medically appropriate exam/evaluation, counseling and educating, placing orders, referring and communicating with other health care professionals, documenting clinical information in the EHR, and coordinating care.  Rolan Hoyle, PA-C, DMSc, Nutritionist [facility] [phone]

## 2024-07-31 ENCOUNTER — Telehealth: Payer: Self-pay

## 2024-07-31 ENCOUNTER — Ambulatory Visit: Payer: Self-pay | Admitting: Physician Assistant

## 2024-07-31 LAB — CBC WITH DIFFERENTIAL/PLATELET
Basophils Absolute: 0 x10E3/uL (ref 0.0–0.2)
Basos: 1 %
EOS (ABSOLUTE): 0.2 x10E3/uL (ref 0.0–0.4)
Eos: 2 %
Hematocrit: 43.3 % (ref 37.5–51.0)
Hemoglobin: 14 g/dL (ref 13.0–17.7)
Immature Grans (Abs): 0.1 x10E3/uL (ref 0.0–0.1)
Immature Granulocytes: 1 %
Lymphocytes Absolute: 2.2 x10E3/uL (ref 0.7–3.1)
Lymphs: 27 %
MCH: 27.9 pg (ref 26.6–33.0)
MCHC: 32.3 g/dL (ref 31.5–35.7)
MCV: 86 fL (ref 79–97)
Monocytes Absolute: 0.7 x10E3/uL (ref 0.1–0.9)
Monocytes: 9 %
Neutrophils Absolute: 4.9 x10E3/uL (ref 1.4–7.0)
Neutrophils: 60 %
Platelets: 228 x10E3/uL (ref 150–450)
RBC: 5.02 x10E6/uL (ref 4.14–5.80)
RDW: 13.1 % (ref 11.6–15.4)
WBC: 8 x10E3/uL (ref 3.4–10.8)

## 2024-07-31 LAB — DRUG SCREEN, URINE
Amphetamines, Urine: NEGATIVE ng/mL
Barbiturate screen, urine: NEGATIVE ng/mL
Benzodiazepine Quant, Ur: NEGATIVE ng/mL
Cannabinoid Quant, Ur: POSITIVE ng/mL — AB
Cocaine (Metab.): NEGATIVE ng/mL
Opiate Quant, Ur: NEGATIVE ng/mL
PCP Quant, Ur: NEGATIVE ng/mL

## 2024-07-31 LAB — TSH: TSH: 2.57 u[IU]/mL (ref 0.450–4.500)

## 2024-07-31 LAB — COMPREHENSIVE METABOLIC PANEL WITH GFR
ALT: 18 IU/L (ref 0–44)
AST: 16 IU/L (ref 0–40)
Albumin: 4.4 g/dL (ref 4.1–5.1)
Alkaline Phosphatase: 71 IU/L (ref 47–123)
BUN/Creatinine Ratio: 6 — ABNORMAL LOW (ref 9–20)
BUN: 7 mg/dL (ref 6–24)
Bilirubin Total: 0.4 mg/dL (ref 0.0–1.2)
CO2: 23 mmol/L (ref 20–29)
Calcium: 9.6 mg/dL (ref 8.7–10.2)
Chloride: 102 mmol/L (ref 96–106)
Creatinine, Ser: 1.09 mg/dL (ref 0.76–1.27)
Globulin, Total: 2.8 g/dL (ref 1.5–4.5)
Glucose: 74 mg/dL (ref 70–99)
Potassium: 4.6 mmol/L (ref 3.5–5.2)
Sodium: 141 mmol/L (ref 134–144)
Total Protein: 7.2 g/dL (ref 6.0–8.5)
eGFR: 86 mL/min/1.73 (ref >59)

## 2024-07-31 LAB — LIPID PANEL
Chol/HDL Ratio: 2.1 ratio (ref 0.0–5.0)
Cholesterol, Total: 119 mg/dL (ref 100–199)
HDL: 57 mg/dL (ref >39)
LDL Chol Calc (NIH): 49 mg/dL (ref 0–99)
Triglycerides: 56 mg/dL (ref 0–149)
VLDL Cholesterol Cal: 13 mg/dL (ref 5–40)

## 2024-07-31 NOTE — Progress Notes (Signed)
 Complex Care Management Note Care Guide Note  07/31/2024 Name: Brian Rios MRN: [id number] DOB: [birth year]   Complex Care Management Outreach Attempts: An unsuccessful telephone outreach was attempted today to offer the patient information about available complex care management services.  Follow Up Plan:  Additional outreach attempts will be made to offer the patient complex care management information and services.   Encounter Outcome:  No Answer  Jeoffrey Buffalo , RMA     [place]  [facility], [facility] Guide  Direct Dial: [phone]  Website: [email or web address]

## 2024-08-03 NOTE — Progress Notes (Signed)
 Complex Care Management Note  Care Guide Note 08/03/2024 Name: Alanson Hausmann MRN: [id number] DOB: [birth year]  Jahbari Repinski is a 43 y.o. year old male who sees Manya Toribio SQUIBB, [place] for primary care. I reached out to Lonni Louder by phone today to offer complex care management services.  Mr. Hudler was given information about Complex Care Management services today including:   The Complex Care Management services include support from the care team which includes your Nurse Care Manager, Clinical Social Worker, or Pharmacist.  The Complex Care Management team is here to help remove barriers to the health concerns and goals most important to you. Complex Care Management services are voluntary, and the patient may decline or stop services at any time by request to their care team member.   Complex Care Management Consent Status: Patient agreed to services and verbal consent obtained.   Follow up plan:  Telephone appointment with complex care management team member scheduled for:  08/06/2024  Encounter Outcome:  Patient Scheduled  Jeoffrey Buffalo , RMA       [facility], [facility] Guide  Direct Dial: [phone]  Website: [email or web address]

## 2024-08-05 ENCOUNTER — Ambulatory Visit: Payer: MEDICAID

## 2024-08-05 NOTE — Telephone Encounter (Signed)
 Pt. did not arrive for his OT appointment this afternoon. Attempted to follow-up with a courtesy, however there was no response.

## 2024-08-06 ENCOUNTER — Telehealth: Payer: Self-pay | Admitting: Physician Assistant

## 2024-08-06 ENCOUNTER — Other Ambulatory Visit: Payer: MEDICAID

## 2024-08-06 ENCOUNTER — Other Ambulatory Visit: Payer: Self-pay | Admitting: Physician Assistant

## 2024-08-06 ENCOUNTER — Telehealth: Payer: Self-pay

## 2024-08-06 DIAGNOSIS — Z79899 Other long term (current) drug therapy: Secondary | ICD-10-CM | POA: Insufficient documentation

## 2024-08-06 NOTE — Telephone Encounter (Signed)
 [facility] called the office. Stated that they don't manage pt Depakote  so they don't normally test for something they don't manage. Sated they have only seen the patient 1 time in June. Stated Dr. Louretta is out of the office until Monday and his medication can't get filled until he gets the labs done.  Was told to call back if we can order the labs.   Megan- [phone]  KP

## 2024-08-06 NOTE — Telephone Encounter (Signed)
 Called [place] was told she was out of the office for the day.  KP

## 2024-08-06 NOTE — Telephone Encounter (Signed)
 Called pt phone went straight to voicemail. VM not set up.  KP

## 2024-08-06 NOTE — Telephone Encounter (Signed)
 Error.     KP

## 2024-08-06 NOTE — Telephone Encounter (Signed)
 Copied from CRM [id number]. Topic: General - Other >> Aug 06, 2024 12:06 PM Turkey B wrote: Reason for CRM: ms matthews from [facility], want to know if we do depakote  lab work

## 2024-08-06 NOTE — Patient Instructions (Signed)
 Visit Information  Thank you for taking time to visit with me today.   Tailored Plan Medicaid On April 22, 2023 some people on [place] Medicaid will move to a new kind of Medicaid health plan called a Tailored Plan. Tailored Plans cover your doctor visits, prescription drugs, and health care services.    If your [place] Medicaid will move to a Tailored Plan, you should have gotten a letter and welcome packet. If you're not sure, call your [place] Medicaid Enrollment Broker at [phone] and ask.  Check out these free materials, in [place] and [place], to learn more about your Tailored Plan: [email or web address]/Tailored-Plans/Toolkit  Tailored Care Management Services  TCM services are available to you now. If you are a Tailored Plan member or will be and want information about Tailored Care Management Services including rides to appointments and community and home services, call the Care Management provider for your county of residence:    [facility] [place], [place])  Member Services: [phone] Behavioral Health Crisis Line: 859-440-0060, [place], [place], [place], [place])  Member Services: [phone] Behavioral Health Crisis Line: (778)195-7250     Please call the Suicide and Crisis Lifeline: 988 call the [place]  National Suicide Prevention Lifeline: (307)509-3935 or TTY: 6231892272 TTY 602-173-4278) to talk to a trained counselor call 1-800-273-TALK (toll free, 24 hour hotline) call 911 if you are experiencing a Mental Health or Behavioral Health Crisis or need someone to talk to.  Patient verbalizes understanding of instructions and care plan provided today and agrees to view in MyChart. Active MyChart status and patient understanding of how to access instructions and care plan via MyChart confirmed with patient.     Thersia Hoar, HEDWIG, MHA [place]  [facility] Social Worker, Population Health [phone]

## 2024-08-06 NOTE — Telephone Encounter (Signed)
 Ms. Brian Rios was informed of this information. She will call Dr Clement office.

## 2024-08-06 NOTE — Patient Outreach (Addendum)
 Referral received from Manya Toribio SQUIBB, [place] for assistance with care management needs. This patient is enrolled in [facility] and has associated care management benefits. I reached out to the health plan today to refer the patient to the assigned health plan care management team member.   Interventions:  Sascha Baugher was advised to anticipate contact from the health plan for follow up about care management services and resources.  SW provided patient with resources for housing. SW explained due to the housing crisis he could place his name on the waitlist for housing authority, as well as contact his Case Production designer, theatre/television/film with [employer]. SW contacted patients CM with [employer] [place] and left a voicemail. SW spoke with [place] and she is aware of the FL2 and states patients Community support team with [facility] provided the form to patient. He is  trying to access the  TCL program. SW faxed the [facility] to [place] at [phone].  Thersia Hoar, HEDWIG, MHA [place]  [facility] Social Worker, Population Health [phone]

## 2024-08-07 NOTE — Telephone Encounter (Signed)
 Called pt he is aware and will get his labs done.  KP

## 2024-08-10 NOTE — Telephone Encounter (Signed)
 Tried to call Megan was on hold for 5 mins. No one answered the phone.  KP

## 2024-08-11 ENCOUNTER — Other Ambulatory Visit: Payer: Self-pay | Admitting: Physician Assistant

## 2024-08-11 MED ORDER — DIVALPROEX SODIUM ER 250 MG PO TB24: 750.0000 mg | ORAL_TABLET | Freq: Every day | ORAL | 0 refills | Status: AC

## 2024-08-11 NOTE — Telephone Encounter (Signed)
 Sent 30 day supply Depakote  to [place]

## 2024-08-11 NOTE — Telephone Encounter (Signed)
 Wants us  to send in pt enough medication to bridge him until his next appt on 08/25/24.  KP

## 2024-08-11 NOTE — Telephone Encounter (Signed)
 Spoke to [place] let her know that we ordered the labs. She is aware. Pt has appt on 08/25/24. They should be able to take over medication for pt. They will call back if they can't take it over.  KP

## 2024-08-12 ENCOUNTER — Ambulatory Visit: Payer: MEDICAID

## 2024-08-19 ENCOUNTER — Ambulatory Visit: Payer: MEDICAID | Admitting: Occupational Therapy

## 2024-08-19 DIAGNOSIS — M6281 Muscle weakness (generalized): Secondary | ICD-10-CM

## 2024-08-19 NOTE — Therapy (Addendum)
 OUTPATIENT OCCUPATIONAL THERAPY NEURO TREATMENT NOTE  Patient Name: Brian Rios MRN: [id number] DOB:1981/07/24, 43 y.o., Rios Today's Date: 08/19/2024  PCP: Toribio Hoyle, PA REFERRING PROVIDER: Toribio Hoyle, PA  END OF SESSION:  OT End of Session - 08/19/24 2150     Visit Number 6    Number of Visits 24    Date for Recertification  10/07/24    OT Start Time 1235    OT Stop Time 1315    OT Time Calculation (min) 40 min    Activity Tolerance Patient tolerated treatment well    Behavior During Therapy [facility] for tasks assessed/performed         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   left cerebrum   Erectile dysfunction    Heart murmur    Hyperlipidemia    Schizophrenia (HCC)    Tobacco abuse    Past Surgical History:  Procedure Laterality Date   BOWEL RESECTION N/Brian 09/04/2022   Procedure: SMALL BOWEL RESECTION, open;  Surgeon: Jordis Laneta FALCON, MD;  Location: [facility] ORS;  Service: General;  Laterality: N/Brian;   IR CT HEAD LTD  03/21/2023   IR CT HEAD LTD  03/21/2023   IR PERCUTANEOUS ART THROMBECTOMY/INFUSION INTRACRANIAL INC DIAG ANGIO  03/21/2023   ORIF ORBITAL FRACTURE  1985   PARTIAL COLECTOMY Right 09/04/2022   Procedure: PARTIAL COLECTOMY;  Surgeon: Jordis Laneta FALCON, MD;  Location: [facility] ORS;  Service: General;  Laterality: Right;  Right colectimy   RADIOLOGY WITH ANESTHESIA N/Brian 03/21/2023   Procedure: RADIOLOGY WITH ANESTHESIA;  Surgeon: Radiologist, Medication, MD;  Location: [facility] OR;  Service: Radiology;  Laterality: N/Brian;   TEE WITHOUT CARDIOVERSION N/Brian 07/19/2020   Procedure: TRANSESOPHAGEAL ECHOCARDIOGRAM (TEE);  Surgeon: Hester Wolm PARAS, MD;  Location: [facility] ORS;  Service: Cardiovascular;  Laterality: N/Brian;   WRIST SURGERY Left 11/07/2013   REPAIR ULNAR ARTERY, NERVE AND TENDON   Patient Active Problem List   Diagnosis Date Noted   High risk  medication use 08/06/2024   Housing instability, currently housed 07/30/2024   Speech and language deficits 04/22/2023   Hemiparesis affecting right side as late effect of cerebrovascular accident (CVA) (HCC) 04/22/2023   Spasticity 04/22/2023   Chronic diarrhea 04/22/2023   Chronic schizophrenia (HCC) 04/01/2023   S/P right colectomy 09/04/2022   Dental decay 06/12/2022   Erectile dysfunction 07/26/2021   Hyperlipidemia LDL goal <70 03/28/2021   History of embolic stroke 07/18/2020   Drug abuse (HCC) 07/17/2020   Major depressive disorder, recurrent episode, severe (HCC) 02/20/2015   Alcohol use disorder, severe, in early remission (HCC) 02/20/2015   Stimulant use disorder 02/20/2015   Tobacco use disorder 02/20/2015   ONSET DATE: 03/20/24  REFERRING DIAG: Muscle weakness (generalized)  Other lack of coordination  Hemiparesis affecting right side as late effect of cerebrovascular accident (HCC)  THERAPY DIAG:  Muscle weakness (generalized)  Rationale for Evaluation and Treatment: Rehabilitation  SUBJECTIVE:  SUBJECTIVE STATEMENT:  Patient arrived very early for his appointment today and was able to be accommodated to be seen at the earlier time per patient request  Brian Rios accompanied by: self  PERTINENT HISTORY: Per chart: Patient is Brian Rios with PMH of alcohol/tobacco use, left basal ganglia embolic stroke in 2021 (no residual deficits), schizophrenia maintained on Abilify , Depakote , Lexapro  as well as Cogentin , hyperlipidemia, and 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  Presented 03/21/2023 to [facility] with acute onset of right-sided weakness facial droop and aphasia. Cranial CT scan worrisome for acute infarct in the posterior left parietal lobe. CTA showed occlusion of 2 proximal left M2 branches and mild left M1 stenosis. Widely patent carotid arteries.  Patient transferred to [facility] for admission and underwent arteriogram with revascularization of  occluded left MCA however it reoccluded. MRI of the brain showed interval increase in size of acute/subacute nonhemorrhagic infarct involving the majority the left insular cortex and posterior left frontal operculum extending into the high posterior left parietal lobe. No acute hemorrhage or significant mass effect. Patient admitted to CIR on 03/27/23, discharged .   PRECAUTIONS: None  WEIGHT BEARING RESTRICTIONS: No  PAIN: 08/19/2024: Pain reported 07/22/24: No pain reported Are you having pain? Yes: NPRS scale: 7/10 in the wrist, >7/10 in bilat feet  Pain location: both feet, R wrist  Pain description: unable  Aggravating factors: walking Brian lot,  Relieving factors: nothing   FALLS: Has patient fallen in last 6 months? Yes. Number of falls Brian Rios estimates 2 times   LIVING ENVIRONMENT: Lives with: lives with their family Lives in: Mobile home Stairs: Yes: External: 1 steps; on right going up and on left going up Has following equipment at home: None  PLOF: Family assisted with some ADLs d/t psychiatric hx   PATIENT GOALS: I hope I get some more use out of my hand.   OBJECTIVE:  Note: Objective measures were completed at Evaluation unless otherwise noted.  HAND DOMINANCE: Right  ADLs: Overall ADLs: intermittent assist from mother Transfers/ambulation related to ADLs: indep Eating: using L non-dominant hand with utensils; unable to cut food  Grooming: using L non-dominant hand UB Dressing: difficulty with clothing fasteners LB Dressing: mom helps with tying draw string pants  Toileting:  using L non-dominant hand Bathing: Brian Rios reports he can use his R hand Brian little, but it's difficulty  Tub Shower transfers: indep Equipment: none  IADLs:  Shopping: supv Light housekeeping: mother assists  Meal Prep: mother currently manages, but Brian Rios reports he cooked prior to CVA Community mobility: Brian Rios relies on friends and family for driving, but reports he was able to drive prior to his  CVA Medication management: mom currently managing; Brian Rios reports he was able to manage them before his stroke  Financial management: Brian Rios reports he has 1 bill (rent) and he pays his mom.  Handwriting: Difficulty grasping pen, scribbles for signature, but not at baseline.   POSTURE COMMENTS:  No Significant postural limitations  ACTIVITY TOLERANCE: Activity tolerance: Will continue to assess within functional contexts   UPPER EXTREMITY ROM:  BUEs WFL  UPPER EXTREMITY MMT:   R shoulder 4+/5, L 5/5  MMT Right eval Right  07/15/24  Left eval  Shoulder flexion 4+ 4+/5  5  Shoulder abduction 4+ 4+/5  5  Shoulder adduction      Shoulder extension      Shoulder internal rotation      Shoulder external rotation      Middle trapezius      Lower trapezius      Elbow flexion 4+ 4+/5  5  Elbow extension 4+ 4+/5  5  Wrist flexion 4+ 4+/5  5  Wrist extension 4+ 4+/5  5  Wrist ulnar deviation      Wrist radial deviation      Wrist pronation      Wrist supination      (Blank rows = not tested)  HAND FUNCTION: Grip  strength: Right: 26 lbs; Left: 106 lbs, Lateral pinch: Right: 15 lbs, Left: 20 lbs, and 3 point pinch: Right: 9 (fingers slipped) lbs, Left: 17 lbs  07/15/24  Grip strength: Right: 12 lbs; Left: 106 lbs, Lateral pinch: Right: 11 lbs, Left: 20 lbs, and 3 point pinch: Right: 7 lbs, Left: 17 lbs   COORDINATION: Finger Nose Finger test: increased time/less precision with the RUE 9 Hole Peg test: Right: Placed 3 pegs in 2 min (stopped to limit frustration level) sec; Left: 24 sec  07/15/24:   9 Hole Peg test: Right: Placed 5 pegs in 1 min. & 52 sec.; Left: 24 sec.  SENSATION: R hand tingling/numbness dorsal/volar hand.    EDEMA: No visible swelling   MUSCLE TONE: RUE: Within functional limits  COGNITION: 0x4 Overall cognitive status: hx of schizophrenia, living with mother at baseline  VISION ASSESSMENT: WFL  PERCEPTION: Not tested  PRAXIS: Impaired: Motor planning,  moderate-severe apraxia RUE  OBSERVATIONS:  Brian Rios pleasant, cooperative, eager to improve use of his hand.                                                                                                      TREATMENT DATE: 08/19/2024  Therapeutic Activity:  -Patient performed right hand gross grip strengthening using 2 red level resistive bands gripper.  Patient worked on gross gripping with the gripper vertically with the elbow flexed at 90 degrees, followed by the gripper positioned horizontally while the shoulder was flexed to 90 degrees with the elbow extended. Patient performed 1-2 sets of 10 reps with cues for proper positioning of the upper extremity and the gripper -Lateral, and 3pt. Pinch strengthening using yellow, red, green, and blue, and black level resistive clips requiring used to assume lateral pinch and 3-point pinch position on the clips.  Neuro re-ed:  -Facilitated right hand fine motor coordination skills using resistive tweezers to grasp 1 inch thin sticks from Brian horizontal position and Brian small shallow dish, sustaining the grasp on the tweezers while reaching up to place them into Brian container acquiring frequent cues and assist for alignment of the tweezers.  PATIENT EDUCATION: Education details: R hand FMC skills, hand grip/pinch strengthening. Person educated: Patient Education method: Explanation and Verbal cues, demo Education comprehension: verbalized understanding and needs further education  HOME EXERCISE PROGRAM: R grip strengthening with stress ball   GOALS: Goals reviewed with patient? Yes  SHORT TERM GOALS: Target date: 08/26/2024    Brian Rios will perform HEP with min Brian or better for increasing RUE strength and coordination for daily tasks. Baseline: 07/15/24: Cues required, Eval: Not yet initiated  Goal status:  Ongoing  LONG TERM GOALS: Target date: 10/07/2024   Brian Rios will increase grip strength by 20 or more lbs to enable ability to hold and carry heavier  ADL supplies without dropping. Baseline: 07/15/24: Grip strength: Right: 26 lbs; Left: 106 lbs Eval: R 26; L 106 lbs Goal status:Ongoing  2.  Brian Rios will increase R lateral pinch by 5 or more lbs to enable improved ability to stabilize eating utensils in R dominant hand.  Baseline: 07/15/24:Lateral pinch: Right: 11 lbs, Left: 20 lbs, and 3 point pinch: Right: 7 lbs, Left: 17 lbs  Eval: R 15 lbs, L 20 lbs  Goal status: Ongoing  3.  Brian Rios will increase R hand FMC skills to enable indep with managing clothing fasteners.   Baseline: 07/15/24: 9 Hole Peg test: Right: Placed 5 pegs in 1 min. & 52 sec.; Left: 24 sec. Eval: R: 9 hole placed 3 pegs in 2 min; L 24 sec (all 9 pegs); mother currently assists with clothing fasteners  Goal status :Ongoing  4.  Brian Rios will increase RUE GMC skills to allow use of the R dominant arm for bathing at least 50% of body.  Baseline: 07/15/24: Brian Rios. Continues to use the the left non dominant hand for bathing. Eval: Brian Rios reports he primarily uses L non-dominant arm d/t difficulty bathing with the R  Goal status: Ongoing  ASSESSMENT:  CLINICAL IMPRESSION:  Patient arrived very early for his appointment today, and was able to be accommodated to the earlier time per patient request.  Requires consistent cueing for right upper extremity alignment and positioning on the resistive clips, gross gripper, as well as the resistive tweezers. Brian Rios. continues to benefit from skilled OT to increase RUE strength and coordination in order to work towards return to PLOF and to better engage the RUE into ADL/IADL tasks, reduce burden of care on caregivers, and improve QOL.  PERFORMANCE DEFICITS: in functional skills including ADLs, IADLs, coordination, dexterity, proprioception, sensation, strength, pain, flexibility, Fine motor control, Gross motor control, mobility, balance, body mechanics, decreased knowledge of precautions, decreased knowledge of use of DME, vision, and UE functional use, cognitive  skills including attention, memory, and temperament/personality, and psychosocial skills including coping strategies, environmental adaptation, habits, and routines and behaviors.   IMPAIRMENTS: are limiting patient from ADLs, IADLs, education, leisure, and social participation.   CO-MORBIDITIES: has co-morbidities such as schizophrenia, L embolic CVA, drug abuse, depression that affects occupational performance. Patient will benefit from skilled OT to address above impairments and improve overall function.  MODIFICATION OR ASSISTANCE TO COMPLETE EVALUATION: Min-Moderate modification of tasks or assist with assess necessary to complete an evaluation.  OT OCCUPATIONAL PROFILE AND HISTORY: Detailed assessment: Review of records and additional review of physical, cognitive, psychosocial history related to current functional performance.  CLINICAL DECISION MAKING: Moderate - several treatment options, min-mod task modification necessary  REHAB POTENTIAL: Good  EVALUATION COMPLEXITY: Moderate    PLAN:  OT FREQUENCY: 2x/week  OT DURATION: 12 weeks  PLANNED INTERVENTIONS: 97168 OT Re-evaluation, 97535 self care/ADL training, 02889 therapeutic exercise, 97530 therapeutic activity, 97112 neuromuscular re-education, 97140 manual therapy, 97116 gait training, 02989 moist heat, 97010 cryotherapy, 97750 Physical Performance Testing, 02239 Orthotic Initial, 97763 Orthotic/Prosthetic subsequent, passive range of motion, balance training, functional mobility training, visual/perceptual remediation/compensation, psychosocial skills training, energy conservation, coping strategies training, patient/family education, and DME and/or AE instructions  RECOMMENDED OTHER SERVICES: None at this time  CONSULTED AND AGREED WITH PLAN OF CARE: Patient  PLAN FOR NEXT SESSION: see above  Richardson Otter, MS, OTR/L   08/19/2024, 9:52 PM

## 2024-08-24 ENCOUNTER — Other Ambulatory Visit: Payer: MEDICAID

## 2024-08-24 NOTE — Patient Instructions (Signed)
 Visit Information  Thank you for taking time to visit with me today.   Tailored Plan Medicaid On April 22, 2023 some people on [place] Medicaid will move to a new kind of Medicaid health plan called a Tailored Plan. Tailored Plans cover your doctor visits, prescription drugs, and health care services.    If your [place] Medicaid will move to a Tailored Plan, you should have gotten a letter and welcome packet. If you're not sure, call your [place] Medicaid Enrollment Broker at [phone] and ask.  Check out these free materials, in Spanish and English, to learn more about your Tailored Plan: [email or web address]/Tailored-Plans/Toolkit  Tailored Care Management Services  TCM services are available to you now. If you are a Tailored Plan member or will be and want information about Tailored Care Management Services including rides to appointments and community and home services, call the Care Management provider for your county of residence:    [facility] [place], [place])  Member Services: [phone] Behavioral Health Crisis Line: [phone], [place], [place], [place], [place])  Member Services: [phone] Behavioral Health Crisis Line: 9158519204     Please call the Suicide and Crisis Lifeline: 988 call the [place]  National Suicide Prevention Lifeline: (704)581-5582 or TTY: (360)559-0160 TTY (641)189-4627) to talk to a trained counselor call 1-800-273-TALK (toll free, 24 hour hotline) call 911 if you are experiencing a Mental Health or Behavioral Health Crisis or need someone to talk to.  Care plan and visit instructions communicated with the patient verbally today. Patient agrees to receive a copy in MyChart. Active MyChart status and patient understanding of how to access instructions and care plan via MyChart confirmed with patient.     Thersia Hoar, HEDWIG, MHA [place]  [facility] Social Worker, Population Health [phone]

## 2024-08-24 NOTE — Patient Outreach (Signed)
 Referral received from Manya Toribio SQUIBB, PA for assistance with care management needs. This patient is enrolled in (name of health plan) and has associated care management benefits. I reached out to the health plan today to refer the patient to the assigned health plan care management team member.   Interventions:  Jolan Upchurch was advised to anticipate contact from the health plan for follow up about care management services and resources.  Patient received resources SW sent. Patient does not qualify for TCL program.  Thersia Hoar, BSW, [facility] [place]  [facility] Social Worker, Population Health [phone]

## 2024-08-26 ENCOUNTER — Ambulatory Visit: Payer: MEDICAID | Admitting: Occupational Therapy

## 2024-09-02 ENCOUNTER — Ambulatory Visit: Payer: MEDICAID | Attending: Physician Assistant | Admitting: Occupational Therapy

## 2024-09-02 DIAGNOSIS — M6281 Muscle weakness (generalized): Secondary | ICD-10-CM | POA: Diagnosis present

## 2024-09-02 DIAGNOSIS — R278 Other lack of coordination: Secondary | ICD-10-CM | POA: Insufficient documentation

## 2024-09-02 NOTE — Therapy (Signed)
 OUTPATIENT OCCUPATIONAL THERAPY NEURO TREATMENT NOTE  Patient Name: Brian Rios MRN: [id number] DOB:[birth year], 43 y.o., male Today's Date: 09/02/2024  PCP: Toribio Hoyle, PA REFERRING PROVIDER: Toribio Hoyle, PA  END OF SESSION:  OT End of Session - 09/02/24 1430     Visit Number 7    Number of Visits 24    Date for Recertification  10/07/24    OT Start Time 1400    OT Stop Time 1445    OT Time Calculation (min) 45 min    Activity Tolerance Patient tolerated treatment well    Behavior During Therapy [facility] for tasks assessed/performed         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   left cerebrum   Erectile dysfunction    Heart murmur    Hyperlipidemia    Schizophrenia (HCC)    Tobacco abuse    Past Surgical History:  Procedure Laterality Date   BOWEL RESECTION N/A 09/04/2022   Procedure: SMALL BOWEL RESECTION, open;  Surgeon: Jordis Laneta FALCON, MD;  Location: [facility] ORS;  Service: General;  Laterality: N/A;   IR CT HEAD LTD  03/21/2023   IR CT HEAD LTD  03/21/2023   IR PERCUTANEOUS ART THROMBECTOMY/INFUSION INTRACRANIAL INC DIAG ANGIO  03/21/2023   ORIF ORBITAL FRACTURE  1985   PARTIAL COLECTOMY Right 09/04/2022   Procedure: PARTIAL COLECTOMY;  Surgeon: Jordis Laneta FALCON, MD;  Location: [facility] ORS;  Service: General;  Laterality: Right;  Right colectimy   RADIOLOGY WITH ANESTHESIA N/A 03/21/2023   Procedure: RADIOLOGY WITH ANESTHESIA;  Surgeon: Radiologist, Medication, MD;  Location: [facility] OR;  Service: Radiology;  Laterality: N/A;   TEE WITHOUT CARDIOVERSION N/A 07/19/2020   Procedure: TRANSESOPHAGEAL ECHOCARDIOGRAM (TEE);  Surgeon: Hester Wolm PARAS, MD;  Location: [facility] ORS;  Service: Cardiovascular;  Laterality: N/A;   WRIST SURGERY Left 11/07/2013   REPAIR ULNAR ARTERY, NERVE AND TENDON   Patient Active Problem List   Diagnosis Date Noted   High risk  medication use 08/06/2024   Housing instability, currently housed 07/30/2024   Speech and language deficits 04/22/2023   Hemiparesis affecting right side as late effect of cerebrovascular accident (CVA) (HCC) 04/22/2023   Spasticity 04/22/2023   Chronic diarrhea 04/22/2023   Chronic schizophrenia (HCC) 04/01/2023   S/P right colectomy 09/04/2022   Dental decay 06/12/2022   Erectile dysfunction 07/26/2021   Hyperlipidemia LDL goal <70 03/28/2021   History of embolic stroke 07/18/2020   Drug abuse (HCC) 07/17/2020   Major depressive disorder, recurrent episode, severe (HCC) 02/20/2015   Alcohol use disorder, severe, in early remission (HCC) 02/20/2015   Stimulant use disorder 02/20/2015   Tobacco use disorder 02/20/2015   ONSET DATE: 03/20/24  REFERRING DIAG: Muscle weakness (generalized)  Other lack of coordination  Hemiparesis affecting right side as late effect of cerebrovascular accident (HCC)  THERAPY DIAG:  Muscle weakness (generalized)  Rationale for Evaluation and Treatment: Rehabilitation  SUBJECTIVE:  SUBJECTIVE STATEMENT:  Pt. 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 Presented 03/21/2023 to [facility] with acute onset of right-sided weakness facial droop and aphasia. Cranial CT scan worrisome for  acute infarct in the posterior left parietal lobe. CTA showed occlusion of 2 proximal left M2 branches and mild left M1 stenosis. Widely patent carotid arteries.  Patient transferred to [facility] for admission and underwent arteriogram with revascularization of occluded left MCA however it reoccluded. MRI of the brain showed interval increase in size of  acute/subacute nonhemorrhagic infarct involving the majority the left insular cortex and posterior left frontal operculum extending into the high posterior left parietal lobe. No acute hemorrhage or significant mass effect. Patient admitted to [facility] on 03/27/23, discharged .   PRECAUTIONS: None  WEIGHT BEARING RESTRICTIONS: No  PAIN: 08/19/2024: Pain reported 07/22/24: No pain reported Are you having pain? Yes: NPRS scale: 7/10 in the wrist, >7/10 in bilat feet  Pain location: both feet, R wrist  Pain description: unable  Aggravating factors: walking a lot,  Relieving factors: nothing   FALLS: Has patient fallen in last 6 months? Yes. Number of falls Pt estimates 2 times   LIVING ENVIRONMENT: Lives with: lives with their family Lives in: Mobile home Stairs: Yes: External: 1 steps; on right going up and on left going up Has following equipment at home: None  PLOF: Family assisted with some ADLs d/t psychiatric hx   PATIENT GOALS: I hope I get some more use out of my hand.   OBJECTIVE:  Note: Objective measures were completed at Evaluation unless otherwise noted.  HAND DOMINANCE: Right  ADLs: Overall ADLs: intermittent assist from mother Transfers/ambulation related to ADLs: indep Eating: using L non-dominant hand with utensils; unable to cut food  Grooming: using L non-dominant hand UB Dressing: difficulty with clothing fasteners LB Dressing: mom helps with tying draw string pants  Toileting:  using L non-dominant hand Bathing: pt reports he can use his R hand a little, but it's difficulty  Tub Shower transfers: indep Equipment: none  IADLs:  Shopping: supv Light housekeeping: mother assists  Meal Prep: mother currently manages, but pt reports he cooked prior to CVA Community mobility: Pt relies on friends and family for driving, but reports he was able to drive prior to his CVA Medication management: mom currently managing; pt reports he was able to manage them before his  stroke  Financial management: pt reports he has 1 bill (rent) and he pays his mom.  Handwriting: Difficulty grasping pen, scribbles for signature, but not at baseline.   POSTURE COMMENTS:  No Significant postural limitations  ACTIVITY TOLERANCE: Activity tolerance: Will continue to assess within functional contexts   UPPER EXTREMITY ROM:  BUEs WFL  UPPER EXTREMITY MMT:   R shoulder 4+/5, L 5/5  MMT Right eval Right  07/15/24  Left eval  Shoulder flexion 4+ 4+/5  5  Shoulder abduction 4+ 4+/5  5  Shoulder adduction      Shoulder extension      Shoulder internal rotation      Shoulder external rotation      Middle trapezius      Lower trapezius      Elbow flexion 4+ 4+/5  5  Elbow extension 4+ 4+/5  5  Wrist flexion 4+ 4+/5  5  Wrist extension 4+ 4+/5  5  Wrist ulnar deviation      Wrist radial deviation      Wrist pronation      Wrist supination      (Blank rows = not tested)  HAND FUNCTION: Grip strength: Right: 26 lbs; Left: 106 lbs, Lateral pinch: Right: 15 lbs, Left: 20 lbs, and 3 point pinch:  Right: 9 (fingers slipped) lbs, Left: 17 lbs  07/15/24  Grip strength: Right: 12 lbs; Left: 106 lbs, Lateral pinch: Right: 11 lbs, Left: 20 lbs, and 3 point pinch: Right: 7 lbs, Left: 17 lbs   COORDINATION: Finger Nose Finger test: increased time/less precision with the RUE 9 Hole Peg test: Right: Placed 3 pegs in 2 min (stopped to limit frustration level) sec; Left: 24 sec  07/15/24:   9 Hole Peg test: Right: Placed 5 pegs in 1 min. & 52 sec.; Left: 24 sec.  SENSATION: R hand tingling/numbness dorsal/volar hand.    EDEMA: No visible swelling   MUSCLE TONE: RUE: Within functional limits  COGNITION: 0x4 Overall cognitive status: hx of schizophrenia, living with mother at baseline  VISION ASSESSMENT: WFL  PERCEPTION: Not tested  PRAXIS: Impaired: Motor planning, moderate-severe apraxia RUE  OBSERVATIONS:  Pt pleasant, cooperative, eager to improve use of his  hand.                                                                                                      TREATMENT DATE: 09/02/24  Therapeutic Activity:  -Patient performed right hand gross grip strengthening using 2 red level resistive bands gripper.  Patient worked on gross gripping with the gripper vertically with the elbow flexed at 90 degrees, followed by the gripper positioned horizontally while the shoulder was flexed to 90 degrees with the elbow extended with support proximally. Patient performed 3 sets of 10 reps with cues for proper positioning of the upper extremity and the gripper -Lateral, and 3pt. Pinch strengthening using yellow, red, green, and blue level resistive clips requiring used to assume lateral pinch and 3-point pinch position on the clips.  Neuro re-ed:  -Facilitated [facility]  skills grasping 1 sticks,  cylindrical collars, and  flat washers on the Purdue pegboard. Pt. worked on Editor, Commissioning the 1 sticks with his 2nd digit and thumb, and storing them in the palm. Pt. worked on removing the pegs while attempting to store them in his hand.   PATIENT EDUCATION: Education details: [facility] skills, hand grip/pinch strengthening. Person educated: Patient Education method: Explanation and Verbal cues, demo Education comprehension: verbalized understanding and needs further education  HOME EXERCISE PROGRAM: [facility]  R grip strengthening with stress ball   GOALS: Goals reviewed with patient? Yes  SHORT TERM GOALS: Target date: 08/26/2024    Pt will perform HEP with min A or better for increasing RUE strength and coordination for daily tasks. Baseline: 07/15/24: Cues required, Eval: Not yet initiated  Goal status:  Ongoing  LONG TERM GOALS: Target date: 10/07/2024   Pt will increase grip strength by 20 or more lbs to enable ability to hold and carry heavier ADL supplies without dropping. Baseline: 07/15/24: Grip strength: Right: 26 lbs; Left: 106 lbs Eval: R 26; L 106  lbs Goal status:Ongoing  2.  Pt will increase R lateral pinch by 5 or more lbs to enable improved ability to stabilize eating utensils in R dominant hand. Baseline: 07/15/24:Lateral pinch: Right: 11 lbs, Left: 20 lbs, and 3 point pinch: Right: 7 lbs,  Left: 17 lbs  Eval: R 15 lbs, L 20 lbs  Goal status: Ongoing  3.  Pt will increase R hand [facility] skills to enable indep with managing clothing fasteners.   Baseline: 07/15/24: 9 Hole Peg test: Right: Placed 5 pegs in 1 min. & 52 sec.; Left: 24 sec. Eval: R: 9 hole placed 3 pegs in 2 min; L 24 sec (all 9 pegs); mother currently assists with clothing fasteners  Goal status :Ongoing  4.  Pt will increase RUE [facility] skills to allow use of the R dominant arm for bathing at least 50% of body.  Baseline: 07/15/24: Pt. Continues to use the the left non dominant hand for bathing. Eval: Pt reports he primarily uses L non-dominant arm d/t difficulty bathing with the R  Goal status: Ongoing  ASSESSMENT:  CLINICAL IMPRESSION:  Pt. was able to grasp, and store 2-1 Purdue sticks at a time.  Pt. required increased time to insert the Purdue sticks into the targets on the board. Pt. required the position of the board to be shifted multiple times during the task. Pt. presented with difficulty manipulating the 1/4 collars within his fingers. Pt. Was able to  grasp 2 flat 1/4 washers, however had difficulty placing them onto the targets. Pt. continues to benefit from skilled OT to increase RUE strength and coordination in order to work towards return to PLOF and to better engage the RUE into ADL/IADL tasks, reduce burden of care on caregivers, and improve QOL.  PERFORMANCE DEFICITS: in functional skills including ADLs, IADLs, coordination, dexterity, proprioception, sensation, strength, pain, flexibility, Fine motor control, Gross motor control, mobility, balance, body mechanics, decreased knowledge of precautions, decreased knowledge of use of DME, vision, and UE functional  use, cognitive skills including attention, memory, and temperament/personality, and psychosocial skills including coping strategies, environmental adaptation, habits, and routines and behaviors.   IMPAIRMENTS: are limiting patient from ADLs, IADLs, education, leisure, and social participation.   CO-MORBIDITIES: has co-morbidities such as schizophrenia, L embolic CVA, drug abuse, depression that affects occupational performance. Patient will benefit from skilled OT to address above impairments and improve overall function.  MODIFICATION OR ASSISTANCE TO COMPLETE EVALUATION: Min-Moderate modification of tasks or assist with assess necessary to complete an evaluation.  OT OCCUPATIONAL PROFILE AND HISTORY: Detailed assessment: Review of records and additional review of physical, cognitive, psychosocial history related to current functional performance.  CLINICAL DECISION MAKING: Moderate - several treatment options, min-mod task modification necessary  REHAB POTENTIAL: Good  EVALUATION COMPLEXITY: Moderate    PLAN:  OT FREQUENCY: 2x/week  OT DURATION: 12 weeks  PLANNED INTERVENTIONS: 97168 OT Re-evaluation, 97535 self care/ADL training, 02889 therapeutic exercise, 97530 therapeutic activity, 97112 neuromuscular re-education, 97140 manual therapy, 97116 gait training, 02989 moist heat, 97010 cryotherapy, 97750 Physical Performance Testing, 02239 Orthotic Initial, 97763 Orthotic/Prosthetic subsequent, passive range of motion, balance training, functional mobility training, visual/perceptual remediation/compensation, psychosocial skills training, energy conservation, coping strategies training, patient/family education, and DME and/or AE instructions  RECOMMENDED OTHER SERVICES: None at this time  CONSULTED AND AGREED WITH PLAN OF CARE: Patient  PLAN FOR NEXT SESSION: see above  Richardson Otter, MS, OTR/L   09/02/2024, 3:22 PM

## 2024-09-09 ENCOUNTER — Ambulatory Visit: Payer: MEDICAID | Admitting: Occupational Therapy

## 2024-09-09 DIAGNOSIS — R278 Other lack of coordination: Secondary | ICD-10-CM

## 2024-09-09 DIAGNOSIS — M6281 Muscle weakness (generalized): Secondary | ICD-10-CM

## 2024-09-10 NOTE — Therapy (Signed)
 OUTPATIENT OCCUPATIONAL THERAPY NEURO TREATMENT NOTE  Patient Name: Brian Rios MRN: [id number] DOB:1980-11-04, 43 y.o., male Today's Date: 09/10/2024  PCP: Toribio Hoyle, PA REFERRING PROVIDER: Toribio Hoyle, PA  END OF SESSION:  OT End of Session - 09/09/24 1438     Visit Number 8    Number of Visits 24    Date for Recertification  10/07/24    OT Start Time 1400    OT Stop Time 1445    OT Time Calculation (min) 45 min    Activity Tolerance Patient tolerated treatment well    Behavior During Therapy [facility] for tasks assessed/performed         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   left cerebrum   Erectile dysfunction    Heart murmur    Hyperlipidemia    Schizophrenia (HCC)    Tobacco abuse    Past Surgical History:  Procedure Laterality Date   BOWEL RESECTION N/A 09/04/2022   Procedure: SMALL BOWEL RESECTION, open;  Surgeon: Jordis Laneta FALCON, MD;  Location: [facility] ORS;  Service: General;  Laterality: N/A;   IR CT HEAD LTD  03/21/2023   IR CT HEAD LTD  03/21/2023   IR PERCUTANEOUS ART THROMBECTOMY/INFUSION INTRACRANIAL INC DIAG ANGIO  03/21/2023   ORIF ORBITAL FRACTURE  1985   PARTIAL COLECTOMY Right 09/04/2022   Procedure: PARTIAL COLECTOMY;  Surgeon: Jordis Laneta FALCON, MD;  Location: [facility] ORS;  Service: General;  Laterality: Right;  Right colectimy   RADIOLOGY WITH ANESTHESIA N/A 03/21/2023   Procedure: RADIOLOGY WITH ANESTHESIA;  Surgeon: Radiologist, Medication, MD;  Location: [facility] OR;  Service: Radiology;  Laterality: N/A;   TEE WITHOUT CARDIOVERSION N/A 07/19/2020   Procedure: TRANSESOPHAGEAL ECHOCARDIOGRAM (TEE);  Surgeon: Hester Wolm PARAS, MD;  Location: [facility] ORS;  Service: Cardiovascular;  Laterality: N/A;   WRIST SURGERY Left 11/07/2013   REPAIR ULNAR ARTERY, NERVE AND TENDON   Patient Active Problem List   Diagnosis Date Noted   High risk  medication use 08/06/2024   Housing instability, currently housed 07/30/2024   Speech and language deficits 04/22/2023   Hemiparesis affecting right side as late effect of cerebrovascular accident (CVA) (HCC) 04/22/2023   Spasticity 04/22/2023   Chronic diarrhea 04/22/2023   Chronic schizophrenia (HCC) 04/01/2023   S/P right colectomy 09/04/2022   Dental decay 06/12/2022   Erectile dysfunction 07/26/2021   Hyperlipidemia LDL goal <70 03/28/2021   History of embolic stroke 07/18/2020   Drug abuse (HCC) 07/17/2020   Major depressive disorder, recurrent episode, severe (HCC) 02/20/2015   Alcohol use disorder, severe, in early remission (HCC) 02/20/2015   Stimulant use disorder 02/20/2015   Tobacco use disorder 02/20/2015   ONSET DATE: 03/20/24  REFERRING DIAG: Muscle weakness (generalized)  Other lack of coordination  Hemiparesis affecting right side as late effect of cerebrovascular accident (HCC)  THERAPY DIAG:  Muscle weakness (generalized)  Other lack of coordination  Rationale for Evaluation and Treatment: Rehabilitation  SUBJECTIVE:  SUBJECTIVE STATEMENT:  Pt. 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 Presented 03/21/2023 to [facility] with acute onset of right-sided weakness facial droop and aphasia.  Cranial CT scan worrisome for acute infarct in the posterior left parietal lobe. CTA showed occlusion of 2 proximal left M2 branches and mild left M1 stenosis. Widely patent carotid arteries.  Patient transferred to [facility] for admission and underwent arteriogram with revascularization of occluded left MCA however it reoccluded. MRI of the brain showed interval  increase in size of acute/subacute nonhemorrhagic infarct involving the majority the left insular cortex and posterior left frontal operculum extending into the high posterior left parietal lobe. No acute hemorrhage or significant mass effect. Patient admitted to CIR on 03/27/23, discharged .   PRECAUTIONS: None  WEIGHT BEARING RESTRICTIONS: No  PAIN: 08/19/2024: Pain reported 07/22/24: No pain reported Are you having pain? Yes: NPRS scale: 7/10 in the wrist, >7/10 in bilat feet  Pain location: both feet, R wrist  Pain description: unable  Aggravating factors: walking a lot,  Relieving factors: nothing   FALLS: Has patient fallen in last 6 months? Yes. Number of falls Pt estimates 2 times   LIVING ENVIRONMENT: Lives with: lives with their family Lives in: Mobile home Stairs: Yes: External: 1 steps; on right going up and on left going up Has following equipment at home: None  PLOF: Family assisted with some ADLs d/t psychiatric hx   PATIENT GOALS: I hope I get some more use out of my hand.   OBJECTIVE:  Note: Objective measures were completed at Evaluation unless otherwise noted.  HAND DOMINANCE: Right  ADLs: Overall ADLs: intermittent assist from mother Transfers/ambulation related to ADLs: indep Eating: using L non-dominant hand with utensils; unable to cut food  Grooming: using L non-dominant hand UB Dressing: difficulty with clothing fasteners LB Dressing: mom helps with tying draw string pants  Toileting:  using L non-dominant hand Bathing: pt reports he can use his R hand a little, but it's difficulty  Tub Shower transfers: indep Equipment: none  IADLs:  Shopping: supv Light housekeeping: mother assists  Meal Prep: mother currently manages, but pt reports he cooked prior to CVA Community mobility: Pt relies on friends and family for driving, but reports he was able to drive prior to his CVA Medication management: mom currently managing; pt reports he was able to  manage them before his stroke  Financial management: pt reports he has 1 bill (rent) and he pays his mom.  Handwriting: Difficulty grasping pen, scribbles for signature, but not at baseline.   POSTURE COMMENTS:  No Significant postural limitations  ACTIVITY TOLERANCE: Activity tolerance: Will continue to assess within functional contexts   UPPER EXTREMITY ROM:  BUEs WFL  UPPER EXTREMITY MMT:   R shoulder 4+/5, L 5/5  MMT Right eval Right  07/15/24  Left eval  Shoulder flexion 4+ 4+/5  5  Shoulder abduction 4+ 4+/5  5  Shoulder adduction      Shoulder extension      Shoulder internal rotation      Shoulder external rotation      Middle trapezius      Lower trapezius      Elbow flexion 4+ 4+/5  5  Elbow extension 4+ 4+/5  5  Wrist flexion 4+ 4+/5  5  Wrist extension 4+ 4+/5  5  Wrist ulnar deviation      Wrist radial deviation      Wrist pronation      Wrist supination      (Blank rows = not tested)  HAND FUNCTION: Grip strength: Right: 26 lbs; Left: 106 lbs, Lateral pinch: Right: 15 lbs, Left: 20  lbs, and 3 point pinch: Right: 9 (fingers slipped) lbs, Left: 17 lbs  07/15/24  Grip strength: Right: 12 lbs; Left: 106 lbs, Lateral pinch: Right: 11 lbs, Left: 20 lbs, and 3 point pinch: Right: 7 lbs, Left: 17 lbs   COORDINATION: Finger Nose Finger test: increased time/less precision with the RUE 9 Hole Peg test: Right: Placed 3 pegs in 2 min (stopped to limit frustration level) sec; Left: 24 sec  07/15/24:   9 Hole Peg test: Right: Placed 5 pegs in 1 min. & 52 sec.; Left: 24 sec.  SENSATION: R hand tingling/numbness dorsal/volar hand.    EDEMA: No visible swelling   MUSCLE TONE: RUE: Within functional limits  COGNITION: 0x4 Overall cognitive status: hx of schizophrenia, living with mother at baseline  VISION ASSESSMENT: WFL  PERCEPTION: Not tested  PRAXIS: Impaired: Motor planning, moderate-severe apraxia RUE  OBSERVATIONS:  Pt pleasant, cooperative, eager  to improve use of his hand.                                                                                                      TREATMENT DATE: 09/09/24  There. Ex.:  -BUE strengthening through reciprocal motion using the [employer] for 8 min. On level 4.0 with constant monitoring of the BUEs, and cues for set-up. -Right UE strengthening using a 2# hand weight for elbow flexion/extension, forearm supination, wrist extension, and radial deviation 2 sets 10-20 reps each.  Therapeutic Activity:  -Facilitated [facility]  skills grasping 1 sticks, and  cylindrical collars, and 1/4 washers to build a 3 piece tower on the [employer]. Pt. worked on geneticist, molecular item with the  2nd digit and thumb and placing them vertically onto the pegboard. Pt. worked on placing, and removing the collars onto the vertical sticks.  PATIENT EDUCATION: Education details: [facility] skills, hand grip/pinch strengthening. Person educated: Patient Education method: Explanation and Verbal cues, demo Education comprehension: verbalized understanding and needs further education  HOME EXERCISE PROGRAM: [facility]  R grip strengthening with stress ball   GOALS: Goals reviewed with patient? Yes  SHORT TERM GOALS: Target date: 08/26/2024    Pt will perform HEP with min A or better for increasing RUE strength and coordination for daily tasks. Baseline: 07/15/24: Cues required, Eval: Not yet initiated  Goal status:  Ongoing  LONG TERM GOALS: Target date: 10/07/2024   Pt will increase grip strength by 20 or more lbs to enable ability to hold and carry heavier ADL supplies without dropping. Baseline: 07/15/24: Grip strength: Right: 26 lbs; Left: 106 lbs Eval: R 26; L 106 lbs Goal status:Ongoing  2.  Pt will increase R lateral pinch by 5 or more lbs to enable improved ability to stabilize eating utensils in R dominant hand. Baseline: 07/15/24:Lateral pinch: Right: 11 lbs, Left: 20 lbs, and 3 point pinch: Right: 7 lbs, Left: 17 lbs   Eval: R 15 lbs, L 20 lbs  Goal status: Ongoing  3.  Pt will increase R hand [facility] skills to enable indep with managing clothing fasteners.   Baseline: 07/15/24: 9 Hole Peg test: Right: Placed  5 pegs in 1 min. & 52 sec.; Left: 24 sec. Eval: R: 9 hole placed 3 pegs in 2 min; L 24 sec (all 9 pegs); mother currently assists with clothing fasteners  Goal status :Ongoing  4.  Pt will increase RUE [facility] skills to allow use of the R dominant arm for bathing at least 50% of body.  Baseline: 07/15/24: Pt. Continues to use the the left non dominant hand for bathing. Eval: Pt reports he primarily uses L non-dominant arm d/t difficulty bathing with the R  Goal status: Ongoing  ASSESSMENT:  CLINICAL IMPRESSION:  Pt. was able to grasp, and store 2-1 Purdue sticks at a time.  Pt. required increased time to insert the Purdue sticks into the targets on the board. Pt. required the position of the board to be shifted multiple times during the task. Pt. presented with difficulty manipulating the 1/4 collars within his fingers. Pt. was able to  grasp 2 flat 1/4 washers, however had difficulty placing them onto the targets. Pt. continues to benefit from skilled OT to increase RUE strength and coordination in order to work towards return to PLOF and to better engage the RUE into ADL/IADL tasks, reduce burden of care on caregivers, and improve QOL.  PERFORMANCE DEFICITS: in functional skills including ADLs, IADLs, coordination, dexterity, proprioception, sensation, strength, pain, flexibility, Fine motor control, Gross motor control, mobility, balance, body mechanics, decreased knowledge of precautions, decreased knowledge of use of DME, vision, and UE functional use, cognitive skills including attention, memory, and temperament/personality, and psychosocial skills including coping strategies, environmental adaptation, habits, and routines and behaviors.   IMPAIRMENTS: are limiting patient from ADLs, IADLs, education,  leisure, and social participation.   CO-MORBIDITIES: has co-morbidities such as schizophrenia, L embolic CVA, drug abuse, depression that affects occupational performance. Patient will benefit from skilled OT to address above impairments and improve overall function.  MODIFICATION OR ASSISTANCE TO COMPLETE EVALUATION: Min-Moderate modification of tasks or assist with assess necessary to complete an evaluation.  OT OCCUPATIONAL PROFILE AND HISTORY: Detailed assessment: Review of records and additional review of physical, cognitive, psychosocial history related to current functional performance.  CLINICAL DECISION MAKING: Moderate - several treatment options, min-mod task modification necessary  REHAB POTENTIAL: Good  EVALUATION COMPLEXITY: Moderate    PLAN:  OT FREQUENCY: 2x/week  OT DURATION: 12 weeks  PLANNED INTERVENTIONS: 97168 OT Re-evaluation, 97535 self care/ADL training, 02889 therapeutic exercise, 97530 therapeutic activity, 97112 neuromuscular re-education, 97140 manual therapy, 97116 gait training, 02989 moist heat, 97010 cryotherapy, 97750 Physical Performance Testing, 02239 Orthotic Initial, 97763 Orthotic/Prosthetic subsequent, passive range of motion, balance training, functional mobility training, visual/perceptual remediation/compensation, psychosocial skills training, energy conservation, coping strategies training, patient/family education, and DME and/or AE instructions  RECOMMENDED OTHER SERVICES: None at this time  CONSULTED AND AGREED WITH PLAN OF CARE: Patient  PLAN FOR NEXT SESSION: see above  Richardson Otter, MS, OTR/L   09/10/2024, 8:23 AM

## 2024-09-30 ENCOUNTER — Ambulatory Visit: Payer: MEDICAID | Attending: Physician Assistant | Admitting: Occupational Therapy

## 2024-09-30 ENCOUNTER — Telehealth: Payer: Self-pay | Admitting: Occupational Therapy

## 2024-09-30 DIAGNOSIS — M6281 Muscle weakness (generalized): Secondary | ICD-10-CM | POA: Insufficient documentation

## 2024-09-30 NOTE — Telephone Encounter (Signed)
 Pt. did not arrive for his scheduled OT appointment at 11:45 this morning. A courtesy call was made to follow-up with the Pt. via telephone. Pt. reported that he was planning to attend the appointment at 2pm this afternoon. Our clinic currently has no 2 p.m. appointments available this afternoon. Pt. was provided with his next scheduled OT appointment date, and time.

## 2024-10-07 ENCOUNTER — Ambulatory Visit: Payer: MEDICAID | Admitting: Occupational Therapy

## 2024-10-07 NOTE — Telephone Encounter (Signed)
 Pt. did not arrive for his OT appointment this afternoon. A follow-up courtesy phone call was made. Pt. Answered, and was provided with information about the date and time of his next/final OT appointment on 10/21/24.

## 2024-10-21 ENCOUNTER — Ambulatory Visit: Payer: MEDICAID | Admitting: Occupational Therapy

## 2024-10-21 DIAGNOSIS — M6281 Muscle weakness (generalized): Secondary | ICD-10-CM

## 2024-10-21 NOTE — Therapy (Signed)
 " OUTPATIENT OCCUPATIONAL THERAPY NEURO TREATMENT/RECERT/DISCHARGE NOTE  Patient Name: Brian Rios MRN: [id number] DOB:Jan 23, 1981, 43 y.o., male Today's Date: 10/21/2024  PCP: Toribio Hoyle, PA REFERRING PROVIDER: Toribio Hoyle, PA  END OF SESSION:  OT End of Session - 10/21/24 1726     Visit Number 9    Number of Visits 24    Date for Recertification  10/21/24    OT Start Time 1400    OT Stop Time 1445    OT Time Calculation (min) 45 min    Activity Tolerance Patient tolerated treatment well    Behavior During Therapy [facility] for tasks assessed/performed          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   left cerebrum   Erectile dysfunction    Heart murmur    Hyperlipidemia    Schizophrenia (HCC)    Tobacco abuse    Past Surgical History:  Procedure Laterality Date   BOWEL RESECTION N/A 09/04/2022   Procedure: SMALL BOWEL RESECTION, open;  Surgeon: Jordis Laneta FALCON, MD;  Location: [facility] ORS;  Service: General;  Laterality: N/A;   IR CT HEAD LTD  03/21/2023   IR CT HEAD LTD  03/21/2023   IR PERCUTANEOUS ART THROMBECTOMY/INFUSION INTRACRANIAL INC DIAG ANGIO  03/21/2023   ORIF ORBITAL FRACTURE  1985   PARTIAL COLECTOMY Right 09/04/2022   Procedure: PARTIAL COLECTOMY;  Surgeon: Jordis Laneta FALCON, MD;  Location: [facility] ORS;  Service: General;  Laterality: Right;  Right colectimy   RADIOLOGY WITH ANESTHESIA N/A 03/21/2023   Procedure: RADIOLOGY WITH ANESTHESIA;  Surgeon: Radiologist, Medication, MD;  Location: [facility] OR;  Service: Radiology;  Laterality: N/A;   TEE WITHOUT CARDIOVERSION N/A 07/19/2020   Procedure: TRANSESOPHAGEAL ECHOCARDIOGRAM (TEE);  Surgeon: Hester Wolm PARAS, MD;  Location: [facility] ORS;  Service: Cardiovascular;  Laterality: N/A;   WRIST SURGERY Left 11/07/2013   REPAIR ULNAR ARTERY, NERVE AND TENDON   Patient Active Problem List   Diagnosis Date Noted    High risk medication use 08/06/2024   Housing instability, currently housed 07/30/2024   Speech and language deficits 04/22/2023   Hemiparesis affecting right side as late effect of cerebrovascular accident (CVA) (HCC) 04/22/2023   Spasticity 04/22/2023   Chronic diarrhea 04/22/2023   Chronic schizophrenia (HCC) 04/01/2023   S/P right colectomy 09/04/2022   Dental decay 06/12/2022   Erectile dysfunction 07/26/2021   Hyperlipidemia LDL goal <70 03/28/2021   History of embolic stroke 07/18/2020   Drug abuse (HCC) 07/17/2020   Major depressive disorder, recurrent episode, severe (HCC) 02/20/2015   Alcohol use disorder, severe, in early remission (HCC) 02/20/2015   Stimulant use disorder 02/20/2015   Tobacco use disorder 02/20/2015   ONSET DATE: 03/20/24  REFERRING DIAG: Muscle weakness (generalized)  Other lack of coordination  Hemiparesis affecting right side as late effect of cerebrovascular accident (HCC)  THERAPY DIAG:  Muscle weakness (generalized)  Rationale for Evaluation and Treatment: Rehabilitation  SUBJECTIVE:  SUBJECTIVE STATEMENT:  Pt. 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 Presented 03/21/2023 to [facility] with acute onset of right-sided weakness facial droop and aphasia. Cranial CT scan  worrisome for acute infarct in the posterior left parietal lobe. CTA showed occlusion of 2 proximal left M2 branches and mild left M1 stenosis. Widely patent carotid arteries.  Patient transferred to [facility] for admission and underwent arteriogram with revascularization of occluded left MCA however it reoccluded. MRI of the brain showed interval increase in size of  acute/subacute nonhemorrhagic infarct involving the majority the left insular cortex and posterior left frontal operculum extending into the high posterior left parietal lobe. No acute hemorrhage or significant mass effect. Patient admitted to [facility] on 03/27/23, discharged .   PRECAUTIONS: None  WEIGHT BEARING RESTRICTIONS: No  PAIN: 08/19/2024: Pain reported 07/22/24: No pain reported Are you having pain? Yes: NPRS scale: 7/10 in the wrist, >7/10 in bilat feet  Pain location: both feet, R wrist  Pain description: unable  Aggravating factors: walking a lot,  Relieving factors: nothing   FALLS: Has patient fallen in last 6 months? Yes. Number of falls Pt estimates 2 times   LIVING ENVIRONMENT: Lives with: lives with their family Lives in: Mobile home Stairs: Yes: External: 1 steps; on right going up and on left going up Has following equipment at home: None  PLOF: Family assisted with some ADLs d/t psychiatric hx   PATIENT GOALS: I hope I get some more use out of my hand.   OBJECTIVE:  Note: Objective measures were completed at Evaluation unless otherwise noted.  HAND DOMINANCE: Right  ADLs: Overall ADLs: intermittent assist from mother Transfers/ambulation related to ADLs: indep Eating: using L non-dominant hand with utensils; unable to cut food  Grooming: using L non-dominant hand UB Dressing: difficulty with clothing fasteners LB Dressing: mom helps with tying draw string pants  Toileting:  using L non-dominant hand Bathing: pt reports he can use his R hand a little, but it's difficulty  Tub Shower transfers: indep Equipment: none  IADLs:  Shopping: supv Light housekeeping: mother assists  Meal Prep: mother currently manages, but pt reports he cooked prior to CVA Community mobility: Pt relies on friends and family for driving, but reports he was able to drive prior to his CVA Medication management: mom currently managing; pt reports he was able to manage them before his  stroke  Financial management: pt reports he has 1 bill (rent) and he pays his mom.  Handwriting: Difficulty grasping pen, scribbles for signature, but not at baseline.   POSTURE COMMENTS:  No Significant postural limitations  ACTIVITY TOLERANCE: Activity tolerance: Will continue to assess within functional contexts   UPPER EXTREMITY ROM:  BUEs WFL  UPPER EXTREMITY MMT:   R shoulder 4+/5, L 5/5  MMT Right eval Right  07/15/24 Right 10/21/24 Left eval  Shoulder flexion 4+ 4+/5 5/5 5  Shoulder abduction 4+ 4+/5 5/5 5  Shoulder adduction      Shoulder extension      Shoulder internal rotation      Shoulder external rotation      Middle trapezius      Lower trapezius      Elbow flexion 4+ 4+/5 5/5 5  Elbow extension 4+ 4+/5 5/5 5  Wrist flexion 4+ 4+/5 5/5 5  Wrist extension 4+ 4+/5 5/5 5  Wrist ulnar deviation      Wrist radial deviation      Wrist pronation   5/5   Wrist supination   5/5   (Blank rows = not tested)  HAND FUNCTION: Grip strength: Right: 26 lbs; Left: 106 lbs, Lateral pinch: Right: 15 lbs, Left: 20 lbs, and  3 point pinch: Right: 9 (fingers slipped) lbs, Left: 17 lbs  07/15/24  Grip strength: Right: 12 lbs; Left: 106 lbs, Lateral pinch: Right: 11 lbs, Left: 20 lbs, and 3 point pinch: Right: 7 lbs, Left: 17 lbs  10/21/24:  Grip strength: Right: 52 lbs; Left: 106 lbs, Lateral pinch: Right: 11 lbs, Left: 20 lbs, and 3 point pinch: Right: 16 lbs, Left: 17 lbs   COORDINATION: Finger Nose Finger test: increased time/less precision with the RUE 9 Hole Peg test: Right: Placed 3 pegs in 2 min (stopped to limit frustration level) sec; Left: 24 sec  07/15/24:   9 Hole Peg test: Right: Placed 5 pegs in 1 min. & 52 sec.; Left: 24 sec.  10/21/24:   9 Hole Peg test: Right: Placed 9 pegs in 6 min. & 47 sec.; Left: 24 sec.  SENSATION: R hand tingling/numbness dorsal/volar hand.    EDEMA: No visible swelling   MUSCLE TONE: RUE: Within functional  limits  COGNITION: 0x4 Overall cognitive status: hx of schizophrenia, living with mother at baseline  VISION ASSESSMENT: WFL  PERCEPTION: Not tested  PRAXIS: Impaired: Motor planning, moderate-severe apraxia RUE  OBSERVATIONS:  Pt pleasant, cooperative, eager to improve use of his hand.                                                                                                      TREATMENT DATE: 10/21/24  Measurements were obtained, and goals were reviewed with the Pt.   PATIENT EDUCATION: Education details: [facility] skills, hand grip/pinch strengthening. Person educated: Patient Education method: Explanation and Verbal cues, demo Education comprehension: verbalized understanding and needs further education  HOME EXERCISE PROGRAM: [facility]  R grip strengthening with stress ball   GOALS: Goals reviewed with patient? Yes  SHORT TERM GOALS: Target date: 08/26/2024    Pt will perform HEP with min A or better for increasing RUE strength and coordination for daily tasks. Baseline: 07/15/24: Cues required, Eval: Not yet initiated  Goal status:  Ongoing  LONG TERM GOALS: Target date: 10/21/2024   Pt will increase grip strength by 20 or more lbs to enable ability to hold and carry heavier ADL supplies without dropping. Baseline: 10/21/24: Grip strength: Right: 52 lbs; Left: 106 lbs9/24/25: Grip strength: Right: 12 lbs; Left: 106 lbs Eval: R 26; L 106 lbs Goal status: Met  2.  Pt will increase R lateral pinch by 5 or more lbs to enable improved ability to stabilize eating utensils in R dominant hand. Baseline: 10/21/24:  Lateral pinch: Right: 11 lbs, Left: 20 lbs 07/15/24:Lateral pinch: Right: 11 lbs, Left: 20 lbs, and 3 point pinch: Right: 7 lbs, Left: 17 lbs  Eval: R 15 lbs, L 20 lbs  Goal status: partially met  3.  Pt will increase R hand [facility] skills to enable indep with managing clothing fasteners.   Baseline:10/21/24: 9 Hole Peg test: Right: Placed 9 pegs in 6 min. & 47 sec.;  Left: 24 sec. 07/15/24: 9 Hole Peg test: Right: Placed 5 pegs in 1 min. & 52 sec.; Left: 24 sec. Eval: R: 9  hole placed 3 pegs in 2 min; L 24 sec (all 9 pegs); mother currently assists with clothing fasteners  Goal status: partially met  4.  Pt will increase RUE [facility] skills to allow use of the R dominant arm for bathing at least 50% of body.  Baseline: 10/21/24: Pt. Is now able to engage hi right hand in bathing. 07/15/24: Pt. Continues to use the the left non dominant hand for bathing. Eval: Pt reports he primarily uses L non-dominant arm d/t difficulty bathing with the R  Goal status: Met  ASSESSMENT:  CLINICAL IMPRESSION:  Pt. Has improved with right UE strength, right grip strength, and fluctuated with progress for right lateral pinch strengthening. Pt. Is now able to use his right during bathing. Pt. Presents with impaired right UE motor control, and [facility] skills which limit his ability to efficiently manipulate small objects. Pt. Attendance has been very inconsistent, and limited affecting overall progress. Reviewed coordination tasks to contiue at home. Pt. Was able to return demonstration. Pt. Is now appropriate for discharge from OT services.   PERFORMANCE DEFICITS: in functional skills including ADLs, IADLs, coordination, dexterity, proprioception, sensation, strength, pain, flexibility, Fine motor control, Gross motor control, mobility, balance, body mechanics, decreased knowledge of precautions, decreased knowledge of use of DME, vision, and UE functional use, cognitive skills including attention, memory, and temperament/personality, and psychosocial skills including coping strategies, environmental adaptation, habits, and routines and behaviors.   IMPAIRMENTS: are limiting patient from ADLs, IADLs, education, leisure, and social participation.   CO-MORBIDITIES: has co-morbidities such as schizophrenia, L embolic CVA, drug abuse, depression that affects occupational performance. Patient will  benefit from skilled OT to address above impairments and improve overall function.  MODIFICATION OR ASSISTANCE TO COMPLETE EVALUATION: Min-Moderate modification of tasks or assist with assess necessary to complete an evaluation.  OT OCCUPATIONAL PROFILE AND HISTORY: Detailed assessment: Review of records and additional review of physical, cognitive, psychosocial history related to current functional performance.  CLINICAL DECISION MAKING: Moderate - several treatment options, min-mod task modification necessary  REHAB POTENTIAL: Good  EVALUATION COMPLEXITY: Moderate    PLAN:  OT FREQUENCY: 2x/week  OT DURATION: 12 weeks  PLANNED INTERVENTIONS: 97168 OT Re-evaluation, 97535 self care/ADL training, 02889 therapeutic exercise, 97530 therapeutic activity, 97112 neuromuscular re-education, 97140 manual therapy, 97116 gait training, 02989 moist heat, 97010 cryotherapy, 97750 Physical Performance Testing, 02239 Orthotic Initial, 97763 Orthotic/Prosthetic subsequent, passive range of motion, balance training, functional mobility training, visual/perceptual remediation/compensation, psychosocial skills training, energy conservation, coping strategies training, patient/family education, and DME and/or AE instructions  RECOMMENDED OTHER SERVICES: None at this time  CONSULTED AND AGREED WITH PLAN OF CARE: Patient  PLAN FOR NEXT SESSION: see above  Richardson Otter, MS, OTR/L   10/21/2024, 5:26 PM      "

## 2024-10-27 ENCOUNTER — Other Ambulatory Visit: Payer: Self-pay

## 2024-10-27 ENCOUNTER — Telehealth: Payer: Self-pay

## 2024-10-27 MED ORDER — ATORVASTATIN CALCIUM 40 MG PO TABS: 40.0000 mg | ORAL_TABLET | Freq: Every day | ORAL | 0 refills | Status: AC

## 2024-10-27 NOTE — Telephone Encounter (Signed)
 Please call pt to schedule an appt next week for chronic conditions.  KP

## 2024-10-27 NOTE — Telephone Encounter (Signed)
 30 day supply or atorvastatin  was sent in. Pt needs appt for further refills.  KP

## 2024-11-03 ENCOUNTER — Ambulatory Visit: Payer: MEDICAID | Admitting: Physician Assistant

## 2024-11-03 ENCOUNTER — Encounter: Payer: Self-pay | Admitting: Physician Assistant
